# Patient Record
Sex: Female | Born: 1945 | Race: White | Hispanic: No | Smoking: Never smoker
Health system: Southern US, Community
[De-identification: ages and names within clinical notes are randomized; demographics above are authoritative.]

## PROBLEM LIST (undated history)

## (undated) DIAGNOSIS — K624 Stenosis of anus and rectum: Secondary | ICD-10-CM

## (undated) DIAGNOSIS — K219 Gastro-esophageal reflux disease without esophagitis: Secondary | ICD-10-CM

## (undated) DIAGNOSIS — G20 Parkinson's disease: Secondary | ICD-10-CM

## (undated) DIAGNOSIS — F32A Depression, unspecified: Secondary | ICD-10-CM

## (undated) DIAGNOSIS — Z87442 Personal history of urinary calculi: Secondary | ICD-10-CM

## (undated) DIAGNOSIS — K760 Fatty (change of) liver, not elsewhere classified: Secondary | ICD-10-CM

## (undated) DIAGNOSIS — N201 Calculus of ureter: Secondary | ICD-10-CM

## (undated) DIAGNOSIS — F0280 Dementia in other diseases classified elsewhere without behavioral disturbance: Principal | ICD-10-CM

## (undated) DIAGNOSIS — F419 Anxiety disorder, unspecified: Secondary | ICD-10-CM

## (undated) DIAGNOSIS — G20A1 Parkinson's disease without dyskinesia, without mention of fluctuations: Secondary | ICD-10-CM

## (undated) DIAGNOSIS — K449 Diaphragmatic hernia without obstruction or gangrene: Secondary | ICD-10-CM

## (undated) DIAGNOSIS — E785 Hyperlipidemia, unspecified: Secondary | ICD-10-CM

## (undated) DIAGNOSIS — Z8719 Personal history of other diseases of the digestive system: Secondary | ICD-10-CM

## (undated) DIAGNOSIS — L8990 Pressure ulcer of unspecified site, unspecified stage: Secondary | ICD-10-CM

## (undated) DIAGNOSIS — F329 Major depressive disorder, single episode, unspecified: Secondary | ICD-10-CM

## (undated) DIAGNOSIS — Z973 Presence of spectacles and contact lenses: Secondary | ICD-10-CM

## (undated) HISTORY — DX: Dementia in other diseases classified elsewhere without behavioral disturbance: F02.80

## (undated) HISTORY — DX: Parkinson's disease: G20

## (undated) HISTORY — DX: Hyperlipidemia, unspecified: E78.5

## (undated) HISTORY — DX: Major depressive disorder, single episode, unspecified: F32.9

## (undated) HISTORY — DX: Gastro-esophageal reflux disease without esophagitis: K21.9

## (undated) HISTORY — PX: TONSILLECTOMY: SUR1361

## (undated) HISTORY — DX: Depression, unspecified: F32.A

## (undated) HISTORY — DX: Diaphragmatic hernia without obstruction or gangrene: K44.9

## (undated) HISTORY — DX: Stenosis of anus and rectum: K62.4

## (undated) HISTORY — DX: Fatty (change of) liver, not elsewhere classified: K76.0

## (undated) HISTORY — DX: Anxiety disorder, unspecified: F41.9

---

## 1980-10-29 HISTORY — PX: CHOLECYSTECTOMY OPEN: SUR202

## 1982-10-29 HISTORY — PX: TOTAL ABDOMINAL HYSTERECTOMY W/ BILATERAL SALPINGOOPHORECTOMY: SHX83

## 1991-11-12 ENCOUNTER — Encounter: Payer: Self-pay | Admitting: Physician Assistant

## 1992-08-11 ENCOUNTER — Encounter: Payer: Self-pay | Admitting: Physician Assistant

## 1992-08-29 ENCOUNTER — Encounter: Payer: Self-pay | Admitting: Physician Assistant

## 1992-09-01 ENCOUNTER — Encounter: Payer: Self-pay | Admitting: Physician Assistant

## 1992-09-06 ENCOUNTER — Encounter: Payer: Self-pay | Admitting: Physician Assistant

## 1992-10-29 HISTORY — PX: ANAL FISSURECTOMY: SUR608

## 1993-05-12 ENCOUNTER — Encounter: Payer: Self-pay | Admitting: Physician Assistant

## 1993-11-27 ENCOUNTER — Encounter: Payer: Self-pay | Admitting: Physician Assistant

## 1994-11-03 ENCOUNTER — Encounter: Payer: Self-pay | Admitting: Physician Assistant

## 1995-06-05 ENCOUNTER — Encounter: Payer: Self-pay | Admitting: Physician Assistant

## 1995-07-15 ENCOUNTER — Encounter: Payer: Self-pay | Admitting: Physician Assistant

## 1996-08-26 ENCOUNTER — Encounter: Payer: Self-pay | Admitting: Physician Assistant

## 1996-10-30 ENCOUNTER — Encounter: Payer: Self-pay | Admitting: Physician Assistant

## 1996-11-03 ENCOUNTER — Encounter: Payer: Self-pay | Admitting: Physician Assistant

## 1996-11-06 ENCOUNTER — Encounter: Payer: Self-pay | Admitting: Physician Assistant

## 1996-11-13 ENCOUNTER — Encounter: Payer: Self-pay | Admitting: Physician Assistant

## 1996-11-17 ENCOUNTER — Encounter: Payer: Self-pay | Admitting: Physician Assistant

## 1997-02-17 ENCOUNTER — Encounter: Payer: Self-pay | Admitting: Physician Assistant

## 1997-03-23 ENCOUNTER — Encounter: Payer: Self-pay | Admitting: Physician Assistant

## 1997-06-30 ENCOUNTER — Encounter: Payer: Self-pay | Admitting: Physician Assistant

## 1997-11-15 ENCOUNTER — Encounter: Payer: Self-pay | Admitting: Physician Assistant

## 1998-08-05 ENCOUNTER — Encounter: Payer: Self-pay | Admitting: Physician Assistant

## 1998-08-26 ENCOUNTER — Encounter: Payer: Self-pay | Admitting: Physician Assistant

## 1999-04-07 ENCOUNTER — Emergency Department (HOSPITAL_COMMUNITY): Admission: EM | Admit: 1999-04-07 | Discharge: 1999-04-07 | Payer: Self-pay | Admitting: Emergency Medicine

## 1999-09-08 ENCOUNTER — Encounter: Payer: Self-pay | Admitting: Physician Assistant

## 2001-06-06 ENCOUNTER — Encounter: Payer: Self-pay | Admitting: Physician Assistant

## 2004-10-16 ENCOUNTER — Encounter: Payer: Self-pay | Admitting: Physician Assistant

## 2004-12-28 ENCOUNTER — Encounter: Payer: Self-pay | Admitting: Physician Assistant

## 2004-12-28 ENCOUNTER — Ambulatory Visit (HOSPITAL_COMMUNITY): Admission: RE | Admit: 2004-12-28 | Discharge: 2004-12-28 | Payer: Self-pay | Admitting: Gastroenterology

## 2010-08-01 ENCOUNTER — Ambulatory Visit: Payer: Self-pay | Admitting: Gastroenterology

## 2010-08-01 DIAGNOSIS — F411 Generalized anxiety disorder: Secondary | ICD-10-CM | POA: Insufficient documentation

## 2010-08-01 DIAGNOSIS — K3189 Other diseases of stomach and duodenum: Secondary | ICD-10-CM

## 2010-08-01 DIAGNOSIS — R1013 Epigastric pain: Secondary | ICD-10-CM

## 2010-08-01 DIAGNOSIS — K589 Irritable bowel syndrome without diarrhea: Secondary | ICD-10-CM

## 2010-08-01 DIAGNOSIS — K219 Gastro-esophageal reflux disease without esophagitis: Secondary | ICD-10-CM

## 2010-11-30 NOTE — Letter (Signed)
Summary: [facility]  [facility]   Imported By: Sherian Rein 08/07/2010 13:24:24  _____________________________________________________________________  External Attachment:    Type:   Image     Comment:   External Document

## 2010-11-30 NOTE — Letter (Signed)
Summary: [facility]  [facility]   Imported By: Sherian Rein 08/07/2010 13:44:59  _____________________________________________________________________  External Attachment:    Type:   Image     Comment:   External Document

## 2010-11-30 NOTE — Letter (Signed)
Summary: [facility]  [facility]   Imported By: Sherian Rein 08/07/2010 13:36:40  _____________________________________________________________________  External Attachment:    Type:   Image     Comment:   External Document

## 2010-11-30 NOTE — Letter (Signed)
Summary: [facility]  [facility]   Imported By: Sherian Rein 08/07/2010 13:32:00  _____________________________________________________________________  External Attachment:    Type:   Image     Comment:   External Document

## 2010-11-30 NOTE — Letter (Signed)
Summary: [facility]  [facility]   Imported By: Sherian Rein 08/07/2010 13:43:44  _____________________________________________________________________  External Attachment:    Type:   Image     Comment:   External Document

## 2010-11-30 NOTE — Letter (Signed)
Summary: [facility]  [facility]   Imported By: Sherian Rein 08/07/2010 14:02:00  _____________________________________________________________________  External Attachment:    Type:   Image     Comment:   External Document

## 2010-11-30 NOTE — Letter (Signed)
Summary: Med List 11-17-96 to 10-16-04/[facility]  Med List 11-17-96 to 10-16-04/[facility]   Imported By: Sherian Rein 08/07/2010 13:48:08  _____________________________________________________________________  External Attachment:    Type:   Image     Comment:   External Document

## 2010-11-30 NOTE — Letter (Signed)
Summary: Treatment Sheet 06-20-00 to 06-06-01/[facility]  Treatment Sheet 06-20-00 to 06-06-01/[facility]   Imported By: Sherian Rein 08/07/2010 13:28:56  _____________________________________________________________________  External Attachment:    Type:   Image     Comment:   External Document

## 2010-11-30 NOTE — Letter (Signed)
Summary: [facility]   [facility]   Imported By: Sherian Rein 08/07/2010 13:33:22  _____________________________________________________________________  External Attachment:    Type:   Image     Comment:   External Document

## 2010-11-30 NOTE — Letter (Signed)
Summary: [facility]   Treatment Sheet/[facility]   Imported By: Sherian Rein 08/07/2010 13:30:24  _____________________________________________________________________  External Attachment:    Type:   Image     Comment:   External Document

## 2010-11-30 NOTE — Letter (Signed)
Summary: [facility]  [facility]   Imported By: Sherian Rein 08/07/2010 13:42:46  _____________________________________________________________________  External Attachment:    Type:   Image     Comment:   External Document

## 2010-11-30 NOTE — Letter (Signed)
Summary: [facility]  [facility]   Imported By: Sherian Rein 08/07/2010 14:04:54  _____________________________________________________________________  External Attachment:    Type:   Image     Comment:   External Document

## 2010-11-30 NOTE — Letter (Signed)
Summary: Pt's Hx/[facility]  Pt's Hx/[facility]   Imported By: Sherian Rein 08/07/2010 13:46:23  _____________________________________________________________________  External Attachment:    Type:   Image     Comment:   External Document

## 2010-11-30 NOTE — Procedures (Signed)
Summary: EGD (pg 1)/[place]  EGD (pg 1)/[place]   Imported By: Sherian Rein 08/07/2010 14:00:11  _____________________________________________________________________  External Attachment:    Type:   Image     Comment:   External Document

## 2010-11-30 NOTE — Assessment & Plan Note (Signed)
Summary: UNABLE TO EAT/STOMACH FULL OF BUTTERFLIES//SP   History of Present Illness Visit Type: Initial Visit Primary GI MD: Rob Bunting MD Primary Kristia Jupiter: Dr [facility] Chief Complaint: Patie -nt c/o several months "nervous stomach" and "butterflies in stomach." There has been no associated pain. There has been some belching which is worse after eating and when lying down. History of Present Illness:   65 YO FEMALE  NEW TO GI TODAY. SHE IS S/P CHOLECYSTECTOMY, AND HAS HX OF CHRONIC GERD. SHE WAS PREVIOUSLY EVALUATED BY DR. Randa Evens BUT DOES NOT WOSH TO CONTINUE GI CARE WITH HIM. SHE REPORTS A BAD EXPERIENCE WITH PREVIOUS COLONOSCOPY IN 3/06. SHE WAS NOT ADEQUATELY SEDATED,SAYS SHE ASKED REPEATEDLY FOR THE PROCEDURE TO BE STOPPED AND IT WAS NOT.COLON 3/06 NEGATIVE EXCEPT ANAL STENOSIS.  SHE HAS BEEN ON PREVACID LONG TERM,DENIES ANY HEARTBURN OR DYSPHAGIA. SHE C/O PRIMARILY A "NERVOUS STOMACH" ,OR SENSATION OF QUIVERING OR "BUTTERFLIES".SHE FEELS WORSE WITH SPICY FOODS. ALSO SEEMS TO BE WORSE IF SHE IS ANXIOUS. HER BM'S ARE NORMAL, NO HEME.   GI Review of Systems    Reports acid reflux, belching, and  loss of appetite.      Denies abdominal pain, bloating, chest pain, dysphagia with liquids, dysphagia with solids, heartburn, nausea, vomiting, vomiting blood, weight loss, and  weight gain.      Reports constipation, hemorrhoids, and  irritable bowel syndrome.     Denies anal fissure, black tarry stools, change in bowel habit, diarrhea, diverticulosis, fecal incontinence, heme positive stool, jaundice, light color stool, liver problems, rectal bleeding, and  rectal pain. Preventive Screening-Counseling & Management  Alcohol-Tobacco     Smoking Status: never      Drug Use:  no.      Current Medications (verified): 1)  Alprazolam 0.5 Mg Tabs (Alprazolam) .... Take 1/2 Tablet By Mouth Before Meals 2)  Multivitamins  Tabs (Multiple Vitamin) .... Take 1 Tablet By  Mouth Once A Day 3)  Prevacid 30 Mg Cpdr (Lansoprazole) .... Take 1 Tablet By Mouth Once A Day  Allergies (verified): 1)  ! 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  Alcohol Use - no Illicit Drug Use - no Smoking Status:  never Drug Use:  no  Review of Systems       The patient complains of allergy/sinus, anxiety-new, and back pain.         SEE HPI  Vital Signs:  Patient profile:   65 year old female Height:      64.5 inches Weight:      185 pounds BMI:     31.38 BSA:     1.90 Pulse rate:   80 / minute Pulse rhythm:   regular BP sitting:   126 / 74  (right arm) Cuff size:   regular  Vitals Entered By: Lamona Curl CMA Duncan Dull) (August 01, 2010 9:46 AM)  Physical Exam  General:  Well developed, well nourished, no acute distress. Head:  Normocephalic and atraumatic. Eyes:  PERRLA, no icterus. Neck:  Supple; no masses or thyromegaly. Lungs:  Clear throughout to auscultation. Heart:  Regular rate and rhythm; no murmurs, rubs,  or bruits. Abdomen:  SOFT, NONTENDER, NO MASS OR HSM,BS+ Rectal:  NOT  DONE Extremities:  No clubbing, cyanosis, edema or deformities noted. Neurologic:  Alert and  oriented x4;  grossly normal neurologically. Psych:  Alert and cooperative. Normal mood and affect.   Impression & Recommendations:  Problem # 1:  DYSPEPSIA (ICD-536.8) Assessment New 64 YO FEMALE WITH CHRONIC GERD, WITH C/O NERVOUS STOMACH /BUTTERFLY SENSATION IN STOMACH PERSISTENT. SUSPECT IBS/DYSPEPSIA  R/O MED SIDE EFFECT WITH PREVACID.   TRIAL OF PROTONIX 40 MG DAILY IN AM TRIAL OF LEVSIN SL AS NEEDED, FOR  STOMACH SPASMS. FOLLOW UP WITH DR. Christella Hartigan.  Problem # 2:  ANXIETY (ICD-300.00) Assessment: Comment Only  Problem # 3:  GERD (ICD-530.81) Assessment: Unchanged  Problem # 4:  COLON NEOPLASIA SURVEILLANCE Assessment: Unchanged LAST COLON 3/06-DR. EDWARDS-NEGATIVE EXCEPT ANAL STENOSIS. NO FAMILY HX-DUE FOR FOLLOW UP 2016- WILL NEED PROPOFOL  FOR SEDATION  Patient Instructions: 1)  We sent a prescription for Protonix Generic to [facility] [address] 2)  We also sent one for Levsin Sublingual for pain and spasms. 3)  Copy sent to : Dr Salley Slaughter, [facility]. 4)  The medication list was reviewed and reconciled.  All changed / newly prescribed medications were explained.  A complete medication list was provided to the patient / caregiver. Prescriptions: LEVSIN/SL 0.125 MG SUBL (HYOSCYAMINE SULFATE) Take 1 tab on tongue to dissolve as needed stomach spasms 1-2 times daily  #60 x 1   Entered by:   Lowry Ram NCMA   Authorized by:   Sammuel Cooper PA-c   Signed by:   Lowry Ram NCMA on 08/01/2010   Method used:   Electronically to        [facility] [id number] (retail)       [address]       [place], [place]  [address]       Ph: [phone] or [phone]       Fax: [phone]   RxID:   [id number] PANTOPRAZOLE SODIUM 40 MG TBEC (PANTOPRAZOLE SODIUM) Take 1 tab 30 min before breakfast  #30 x 3   Entered by:   Lowry Ram NCMA   Authorized by:   Sammuel Cooper PA-c   Signed by:   Lowry Ram NCMA on 08/01/2010   Method used:   Electronically to        [facility] [id number] (retail)       [address]       [place], [place]  [address]       Ph: [phone] or [phone]       Fax: [phone]   RxID:   [id number]

## 2010-11-30 NOTE — Letter (Signed)
Summary: [facility]  [facility]   Imported By: Sherian Rein 08/07/2010 13:34:21  _____________________________________________________________________  External Attachment:    Type:   Image     Comment:   External Document

## 2010-11-30 NOTE — Letter (Signed)
Summary: Initial GYN Profile  Initial GYN Profile   Imported By: Sherian Rein 08/07/2010 13:52:24  _____________________________________________________________________  External Attachment:    Type:   Image     Comment:   External Document

## 2010-11-30 NOTE — Procedures (Signed)
Summary: Colonoscopy/[place]  Colonoscopy/[place]   Imported By: Sherian Rein 08/07/2010 14:06:28  _____________________________________________________________________  External Attachment:    Type:   Image     Comment:   External Document

## 2010-11-30 NOTE — Letter (Signed)
Summary: [facility]  [facility]   Imported By: Sherian Rein 08/07/2010 14:02:51  _____________________________________________________________________  External Attachment:    Type:   Image     Comment:   External Document

## 2010-11-30 NOTE — Letter (Signed)
Summary: [facility]  [facility]   Imported By: Sherian Rein 08/07/2010 14:03:40  _____________________________________________________________________  External Attachment:    Type:   Image     Comment:   External Document

## 2010-11-30 NOTE — Procedures (Signed)
Summary: EGD/[facility]  EGD/[place]   Imported By: Sherian Rein 08/07/2010 13:53:11  _____________________________________________________________________  External Attachment:    Type:   Image     Comment:   External Document

## 2010-11-30 NOTE — Letter (Signed)
Summary: [facility]enter[facility]  [facility]   Imported By: Sherian Rein 08/07/2010 13:58:11  _____________________________________________________________________  External Attachment:    Type:   Image     Comment:   External Document

## 2010-11-30 NOTE — Letter (Signed)
Summary: [facility]enter[facility]   [facility]   Imported By: Sherian Rein 08/07/2010 13:23:20  _____________________________________________________________________  External Attachment:    Type:   Image     Comment:   External Document

## 2010-11-30 NOTE — Letter (Signed)
Summary: [facility]rone[facility]   Imported By: Sherian Rein 08/07/2010 13:49:48  _____________________________________________________________________  External Attachment:    Type:   Image     Comment:   External Document

## 2010-11-30 NOTE — Letter (Signed)
Summary: [facility]enter[facility]  [facility]   Imported By: Sherian Rein 08/07/2010 14:01:08  _____________________________________________________________________  External Attachment:    Type:   Image     Comment:   External Document

## 2010-11-30 NOTE — Letter (Signed)
Summary: [facility]enter[facility]  [facility]enter[facility]   Imported By: Sherian Rein 08/07/2010 13:35:28  _____________________________________________________________________  External Attachment:    Type:   Image     Comment:   External Document

## 2010-11-30 NOTE — Letter (Signed)
Summary: [facility]   [facility]   Imported By: Sherian Rein 08/07/2010 13:25:37  _____________________________________________________________________  External Attachment:    Type:   Image     Comment:   External Document

## 2010-12-01 NOTE — Letter (Signed)
Summary: [facility]  [facility]astroenter[facility]   Imported By: Sherian Rein 08/07/2010 13:40:44  _____________________________________________________________________  External Attachment:    Type:   Image     Comment:   External Document

## 2010-12-01 NOTE — Letter (Signed)
Summary: [facility]  [facility]   Imported By: Sherian Rein 08/07/2010 13:37:48  _____________________________________________________________________  External Attachment:    Type:   Image     Comment:   External Document

## 2011-03-16 NOTE — Op Note (Signed)
NAMEKOHANA, AMBLE           ACCOUNT NO.:  [id number]   MEDICAL RECORD NO.:  [id number]          PATIENT TYPE:  AMB   LOCATION:  ENDO                         FACILITY:  [facility]   PHYSICIAN:  James L. Malon Kindle., M.D.DATE OF BIRTH:  11-Dec-1945   DATE OF PROCEDURE:  12/28/2004  DATE OF DISCHARGE:                                 OPERATIVE REPORT   PROCEDURE:  Colonoscopy.   MEDICATIONS:  Fentanyl 100 mcg, Versed 2 mg IV.   SCOPE:  Olympus pediatric adjustable colonoscope.   INDICATION:  A strong family history of colon cancer, history of anal  stenosis.   DESCRIPTION OF PROCEDURE:  The procedure had been explained to the patient  and consent obtained.  With the patient was in the left lateral decubitus  position, the Olympus scope was inserted and advanced under direct  visualization.  The prep was excellent and able to reach the cecum.  The  patient did have anal stenosis and took some sedation to be able to  introduce the scope.  The ileocecal valve and appendiceal orifice were seen.  The scope was withdrawn.  The cecum, ascending colon, transverse colon,  splenic flexure, descending, and sigmoid colon seemed well upon removal.  No  polyps seen.  The rectum and anal canal were normal.  The scope was  withdrawn.  The patient tolerated the procedure well.   ASSESSMENT:  1.  Family history of colon cancer with negative colonoscopy, V16.0.  2.  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   PLAN:  1.  Yearly Hemoccults.  2.  Will recommend repeating procedure in 5 years.      JLE/MEDQ  D:  12/28/2004  T:  12/28/2004  Job:  [id number]   cc:   Oley Balm. Georgina Pillion, M.D.  [address]  [place]  [place] [address]  Fax: [phone]

## 2011-04-23 ENCOUNTER — Emergency Department (HOSPITAL_COMMUNITY): Payer: Medicare Other

## 2011-04-23 ENCOUNTER — Emergency Department (HOSPITAL_COMMUNITY)
Admission: EM | Admit: 2011-04-23 | Discharge: 2011-04-23 | Disposition: A | Discharge status: Home / Self Care | Payer: Medicare Other | Attending: Emergency Medicine | Admitting: Emergency Medicine

## 2011-04-23 DIAGNOSIS — E785 Hyperlipidemia, unspecified: Secondary | ICD-10-CM | POA: Insufficient documentation

## 2011-04-23 DIAGNOSIS — M545 Low back pain, unspecified: Secondary | ICD-10-CM | POA: Insufficient documentation

## 2011-04-23 LAB — URINE MICROSCOPIC-ADD ON

## 2011-04-23 LAB — URINALYSIS, ROUTINE W REFLEX MICROSCOPIC
Glucose, UA: NEGATIVE mg/dL
Hgb urine dipstick: NEGATIVE
Ketones, ur: NEGATIVE mg/dL
Protein, ur: NEGATIVE mg/dL

## 2011-12-26 ENCOUNTER — Telehealth: Payer: Self-pay | Admitting: Internal Medicine

## 2011-12-26 NOTE — Telephone Encounter (Signed)
Pt has been scheduled for 01/04/12 145 pm pt is aware she has history of constipation and anal fissure.

## 2012-01-04 ENCOUNTER — Ambulatory Visit: Payer: Medicare Other | Admitting: Gastroenterology

## 2012-02-08 ENCOUNTER — Telehealth: Payer: Self-pay | Admitting: Gastroenterology

## 2012-02-08 NOTE — Telephone Encounter (Signed)
That is ok with me if it is OK with Dr. Brodie. 

## 2012-02-11 ENCOUNTER — Encounter: Payer: Self-pay | Admitting: *Deleted

## 2012-02-11 NOTE — Telephone Encounter (Signed)
OK  DB

## 2012-02-19 ENCOUNTER — Ambulatory Visit (INDEPENDENT_AMBULATORY_CARE_PROVIDER_SITE_OTHER): Payer: Medicare Other | Admitting: Internal Medicine

## 2012-02-19 ENCOUNTER — Encounter: Payer: Self-pay | Admitting: Internal Medicine

## 2012-02-19 DIAGNOSIS — K5900 Constipation, unspecified: Secondary | ICD-10-CM

## 2012-02-19 DIAGNOSIS — K624 Stenosis of anus and rectum: Secondary | ICD-10-CM

## 2012-02-19 MED ORDER — LUBIPROSTONE 24 MCG PO CAPS: 24.0000 ug | ORAL_CAPSULE | Freq: Every day | ORAL | Status: AC

## 2012-02-19 MED ORDER — HYDROCORTISONE ACE-PRAMOXINE 2.5-1 % RE CREA: TOPICAL_CREAM | Freq: Three times a day (TID) | RECTAL | Status: AC | PRN

## 2012-02-19 NOTE — Patient Instructions (Addendum)
We have sent the following medications to your pharmacy for you to pick up at your convenience: Amitiza- once daily Analpram Cc: Dr Carilyn Goodpasture Docia Chuck

## 2012-02-19 NOTE — Progress Notes (Signed)
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 She has a history of a hemorrhoidectomy and anal fissurectomy by Dr. Earlene Plater in 1994. The surgery was successful. She now has difficulty evacuating stool and has to strain in spite of taking Colace 100 mg 2 tablets a day. 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 Her level of activity has not changed. She has been taking calcium supplements which are contributing to her constipation. A colonoscopy in March 2006 by Dr. Randa Evens was normal. She was having a lot of pain during the colonoscopy. A recall colonoscopy is due 10 years from her last colonoscopy which would be March 2016. She has a family history of colon cancer in a maternal uncle.  She also had an upper endoscopy in 1998 which showed a small hiatal hernia.   Past Medical History  Diagnosis Date  . Anal stenosis   . Anxiety   . Hyperlipidemia   . IBS (irritable bowel syndrome)   . GERD (gastroesophageal reflux disease)   . Hiatal hernia    Past Surgical History  Procedure Date  . Cholecystectomy   . Hemorrhoid surgery   . Abdominal hysterectomy   . Appendectomy     reports that she has never smoked. She has never used smokeless tobacco. She reports that she does not drink alcohol or use illicit drugs. family history includes Breast cancer in her maternal aunt; Diabetes in her brother; and Pancreatic cancer (age of onset:56) in her maternal uncle.  There is no history of Colon cancer. Allergies  Allergen Reactions  . Sulfonamide Derivatives     REACTION: "flushed"        Review of Systems: Denies heartburn chest pain shortness of breath  The remainder of the 10 point ROS is negative except as outlined in H&P   Physical Exam: General appearance  Well developed, in no distress. Eyes- non icteric. HEENT nontraumatic, normocephalic. Mouth no lesions, tongue  papillated, no cheilosis. Neck supple without adenopathy, thyroid not enlarged, no carotid bruits, no JVD. Lungs Clear to auscultation bilaterally. Cor normal S1, normal S2, regular rhythm, no murmur,  quiet precordium. Abdomen: Soft nontender abdomen with normal active bowel sounds. No tenderness. Liver edge at costal margin. Post cholecystectomy scar in right upper quadrant. Rectal: Patient refused a rectal exam. Perianal area shows  small skin tags at the anal orifice. Tried to insert a finger into the rectum but encountered resistance/spasm and pain.. The patient started to have a lot of discomfort. A small amount of stool was Hemoccult negative. Extremities no pedal edema. Skin no lesions. Neurological alert and oriented x 3. Psychological normal mood and affect.  Assessment and Plan:  Functional constipation. Associated with rectal spasm versus anal stenosis. Unable to examine the patient. She is post prior hemorrhoidectomy which might have resulted in anal rectal scarring. She does not desire to have another colonoscopy until her due date 2016. We will start Analpram cream 2.5% when necessary rectal irritation. We are  going to add Amitiza 24 mcg daily . She will continue Colace 200 mg daily. I have also suggested using calcium supplements in combination with  magnesium to  reduce the constipating effect of the calcium. She will stay on high-fiber diet and use prune juice as needed.   02/19/2012 [name]

## 2012-06-18 ENCOUNTER — Other Ambulatory Visit: Payer: Self-pay | Admitting: Obstetrics & Gynecology

## 2012-06-18 DIAGNOSIS — N63 Unspecified lump in unspecified breast: Secondary | ICD-10-CM

## 2012-06-24 ENCOUNTER — Ambulatory Visit
Admission: RE | Admit: 2012-06-24 | Discharge: 2012-06-24 | Disposition: A | Discharge status: Home / Self Care | Payer: Medicare Other | Source: Ambulatory Visit | Attending: Obstetrics & Gynecology | Admitting: Obstetrics & Gynecology

## 2012-06-24 DIAGNOSIS — N63 Unspecified lump in unspecified breast: Secondary | ICD-10-CM

## 2012-08-19 ENCOUNTER — Other Ambulatory Visit: Payer: Self-pay | Admitting: Obstetrics & Gynecology

## 2012-08-19 DIAGNOSIS — Z1231 Encounter for screening mammogram for malignant neoplasm of breast: Secondary | ICD-10-CM

## 2012-09-10 ENCOUNTER — Ambulatory Visit
Admission: RE | Admit: 2012-09-10 | Discharge: 2012-09-10 | Disposition: A | Discharge status: Home / Self Care | Payer: Medicare Other | Source: Ambulatory Visit | Attending: Obstetrics & Gynecology | Admitting: Obstetrics & Gynecology

## 2012-09-10 DIAGNOSIS — Z1231 Encounter for screening mammogram for malignant neoplasm of breast: Secondary | ICD-10-CM

## 2012-09-19 ENCOUNTER — Emergency Department (HOSPITAL_BASED_OUTPATIENT_CLINIC_OR_DEPARTMENT_OTHER)
Admission: EM | Admit: 2012-09-19 | Discharge: 2012-09-20 | Disposition: A | Discharge status: Home / Self Care | Payer: Medicare Other | Attending: Emergency Medicine | Admitting: Emergency Medicine

## 2012-09-19 DIAGNOSIS — F411 Generalized anxiety disorder: Secondary | ICD-10-CM | POA: Insufficient documentation

## 2012-09-19 DIAGNOSIS — Z79899 Other long term (current) drug therapy: Secondary | ICD-10-CM | POA: Insufficient documentation

## 2012-09-19 DIAGNOSIS — K624 Stenosis of anus and rectum: Secondary | ICD-10-CM | POA: Insufficient documentation

## 2012-09-19 DIAGNOSIS — Z9089 Acquired absence of other organs: Secondary | ICD-10-CM | POA: Insufficient documentation

## 2012-09-19 DIAGNOSIS — F419 Anxiety disorder, unspecified: Secondary | ICD-10-CM

## 2012-09-19 DIAGNOSIS — Z8719 Personal history of other diseases of the digestive system: Secondary | ICD-10-CM | POA: Insufficient documentation

## 2012-09-19 DIAGNOSIS — E785 Hyperlipidemia, unspecified: Secondary | ICD-10-CM | POA: Insufficient documentation

## 2012-09-19 DIAGNOSIS — K589 Irritable bowel syndrome without diarrhea: Secondary | ICD-10-CM | POA: Insufficient documentation

## 2012-09-19 DIAGNOSIS — K449 Diaphragmatic hernia without obstruction or gangrene: Secondary | ICD-10-CM | POA: Insufficient documentation

## 2012-09-19 DIAGNOSIS — Z90711 Acquired absence of uterus with remaining cervical stump: Secondary | ICD-10-CM | POA: Insufficient documentation

## 2012-09-19 LAB — CBC WITH DIFFERENTIAL/PLATELET
Basophils Absolute: 0 10*3/uL (ref 0.0–0.1)
Basophils Relative: 0 % (ref 0–1)
Eosinophils Absolute: 0.1 10*3/uL (ref 0.0–0.7)
HCT: 37.6 % (ref 36.0–46.0)
Hemoglobin: 13.3 g/dL (ref 12.0–15.0)
Lymphocytes Relative: 37 % (ref 12–46)
MCH: 32 pg (ref 26.0–34.0)
MCHC: 35.4 g/dL (ref 30.0–36.0)
Monocytes Absolute: 0.3 10*3/uL (ref 0.1–1.0)
Neutro Abs: 2.4 10*3/uL (ref 1.7–7.7)
RDW: 13.1 % (ref 11.5–15.5)

## 2012-09-19 LAB — COMPREHENSIVE METABOLIC PANEL
BUN: 9 mg/dL (ref 6–23)
CO2: 27 mEq/L (ref 19–32)
Calcium: 9.2 mg/dL (ref 8.4–10.5)
Creatinine, Ser: 0.7 mg/dL (ref 0.50–1.10)
GFR calc Af Amer: >90 mL/min (ref >90)
GFR calc non Af Amer: 88 mL/min — ABNORMAL LOW (ref >90)
Glucose, Bld: 130 mg/dL — ABNORMAL HIGH (ref 70–99)
Total Protein: 7.6 g/dL (ref 6.0–8.3)

## 2012-09-19 LAB — RAPID URINE DRUG SCREEN, HOSP PERFORMED
Amphetamines: NOT DETECTED
Benzodiazepines: POSITIVE — AB
Cocaine: NOT DETECTED
Opiates: NOT DETECTED

## 2012-09-19 LAB — ETHANOL: Alcohol, Ethyl (B): <11 mg/dL (ref 0–11)
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
## 2012-09-19 NOTE — ED Notes (Signed)
Therapeutic alternatives counselor called to report that she is on the way here to speak with patient. RN notified.

## 2012-09-19 NOTE — ED Notes (Signed)
PA at bedside now  
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
## 2012-09-19 NOTE — ED Notes (Signed)
[facility] called. 

## 2012-09-20 NOTE — ED Provider Notes (Signed)
Pt has been seen and assessed by mobile crisis.  She continues to have no SI/HI.  Mobile crisis has given her information about outpatient followup.  She states she is safe to leave with daughter.  Discharged with strict return precautions.  Pt agreeable with plan.  Ethelda Chick, MD 09/20/12 [phone]

## 2012-09-20 NOTE — ED Notes (Signed)
Mobile Crisis counselor with pt now

## 2012-09-23 ENCOUNTER — Encounter: Payer: Self-pay | Admitting: *Deleted

## 2012-09-23 NOTE — ED Provider Notes (Signed)
Medical screening examination/treatment/procedure(s) were performed by non-physician practitioner and as supervising physician I was immediately available for consultation/collaboration.  Raeford Razor, MD 09/23/12 1452

## 2012-10-01 ENCOUNTER — Ambulatory Visit (INDEPENDENT_AMBULATORY_CARE_PROVIDER_SITE_OTHER): Payer: Medicare Other | Admitting: Internal Medicine

## 2012-10-01 ENCOUNTER — Encounter: Payer: Self-pay | Admitting: Internal Medicine

## 2012-10-01 VITALS — BP 130/82 | HR 88 | Ht 64.0 in | Wt 188.8 lb

## 2012-10-01 DIAGNOSIS — R1013 Epigastric pain: Secondary | ICD-10-CM

## 2012-10-01 NOTE — Patient Instructions (Addendum)
You have been scheduled for an endoscopy with propofol. Please follow written instructions given to you at your visit today. If you use inhalers (even only as needed) or a CPAP machine, please bring them with you on the day of your procedure.  CC: Dr Carilyn Goodpasture Docia Chuck
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
## 2012-10-02 ENCOUNTER — Ambulatory Visit (AMBULATORY_SURGERY_CENTER): Payer: Medicare Other | Admitting: Internal Medicine

## 2012-10-02 ENCOUNTER — Encounter: Payer: Self-pay | Admitting: Internal Medicine

## 2012-10-02 ENCOUNTER — Other Ambulatory Visit: Payer: Self-pay | Admitting: Family Medicine

## 2012-10-02 VITALS — BP 149/82 | HR 77 | Temp 98.8°F | Resp 18 | Ht 64.0 in | Wt 188.0 lb

## 2012-10-02 DIAGNOSIS — K219 Gastro-esophageal reflux disease without esophagitis: Secondary | ICD-10-CM

## 2012-10-02 DIAGNOSIS — R1013 Epigastric pain: Secondary | ICD-10-CM

## 2012-10-02 MED ORDER — SODIUM CHLORIDE 0.9 % IV SOLN: 500.0000 mL | INTRAVENOUS | Status: DC

## 2012-10-02 MED ORDER — LEVSIN/SL 0.125 MG SL SUBL: 0.1250 mg | SUBLINGUAL_TABLET | SUBLINGUAL | Status: DC | PRN

## 2012-10-02 NOTE — Op Note (Signed)
[facility] [address] [place], [address]   ENDOSCOPY PROCEDURE REPORT  PATIENT: Becky Figueroa, Becky Figueroa  MR#: [id number] BIRTHDATE: September 06, 1946 , 66  yrs. old GENDER: Female ENDOSCOPIST: Hart Carwin, MD REFERRED BY:  Darrow Bussing, M.D. PROCEDURE DATE:  10/02/2012 PROCEDURE:  EGD, diagnostic ASA CLASS:     Class II INDICATIONS:  Epigastric pain.,EGD 1998- hiatal hernia MEDICATIONS: MAC sedation, administered by CRNA and Propofol (Diprivan) 80 mg IV TOPICAL ANESTHETIC: none  DESCRIPTION OF PROCEDURE: After the risks benefits and alternatives of the procedure were thoroughly explained, informed consent was obtained.  The LB GIF-H180 [id number] endoscope was introduced through the mouth and advanced to the second portion of the duodenum. Without limitations.  The instrument was slowly withdrawn as the mucosa was fully examined.      The upper, middle and distal third of the esophagus were carefully inspected and no abnormalities were noted.  The z-line was well seen at the GEJ.  The endoscope was pushed into the fundus which was normal including a retroflexed view.  The antrum, gastric body, first and second part of the duodenum were unremarkable. Retroflexed views revealed no abnormalities.     The scope was then withdrawn from the patient and the procedure completed.  COMPLICATIONS: There were no complications. ENDOSCOPIC IMPRESSION:  Normal EGD  RECOMMENDATIONS: nothing to account for the abdominal pain, which may be functional ( stress related), use Levsin SL .125 mg prn REPEAT EXAM: no recall  eSigned:  Hart Carwin, MD 10/02/2012 4:12 PM   CC:

## 2012-10-02 NOTE — Progress Notes (Signed)
No complaints noted in the recovery room. Maw  Patient did not have preoperative order for IV antibiotic SSI prophylaxis. (G8918) Patient did not experience any of the following events: a burn prior to discharge; a fall within the facility; wrong site/side/patient/procedure/implant event; or a hospital transfer or hospital admission upon discharge from the facility. (G8907)  

## 2012-10-02 NOTE — Patient Instructions (Signed)
YOU HAD AN ENDOSCOPIC PROCEDURE TODAY AT THE [facility]: Refer to the procedure report that was given to you for any specific questions about what was found during the examination.  If the procedure report does not answer your questions, please call your gastroenterologist to clarify.  If you requested that your care partner not be given the details of your procedure findings, then the procedure report has been included in a sealed envelope for you to review at your convenience later.  YOU SHOULD EXPECT: Some feelings of bloating in the abdomen. Passage of more gas than usual.  Walking can help get rid of the air that was put into your GI tract during the procedure and reduce the bloating. If you had a lower endoscopy (such as a colonoscopy or flexible sigmoidoscopy) you may notice spotting of blood in your stool or on the toilet paper. If you underwent a bowel prep for your procedure, then you may not have a normal bowel movement for a few days.  DIET: Your first meal following the procedure should be a light meal and then it is ok to progress to your normal diet.  A half-sandwich or bowl of soup is an example of a good first meal.  Heavy or fried foods are harder to digest and may make you feel nauseous or bloated.  Likewise meals heavy in dairy and vegetables can cause extra gas to form and this can also increase the bloating.  Drink plenty of fluids but you should avoid alcoholic beverages for 24 hours.  ACTIVITY: Your care partner should take you home directly after the procedure.  You should plan to take it easy, moving slowly for the rest of the day.  You can resume normal activity the day after the procedure however you should NOT DRIVE or use heavy machinery for 24 hours (because of the sedation medicines used during the test).    SYMPTOMS TO REPORT IMMEDIATELY: A gastroenterologist can be reached at any hour.  During normal business hours, 8:30 AM to 5:00 PM Monday through Friday,  call [phone].  After hours and on weekends, please call the GI answering service at [phone] who will take a message and have the physician on call contact you.  Following upper endoscopy (EGD)  Vomiting of blood or coffee ground material  New chest pain or pain under the shoulder blades  Painful or persistently difficult swallowing  New shortness of breath  Fever of 100F or higher  Black, tarry-looking stools  FOLLOW UP: If any biopsies were taken you will be contacted by phone or by letter within the next 1-3 weeks.  Call your gastroenterologist if you have not heard about the biopsies in 3 weeks.  Our staff will call the home number listed on your records the next business day following your procedure to check on you and address any questions or concerns that you may have at that time regarding the information given to you following your procedure. This is a courtesy call and so if there is no answer at the home number and we have not heard from you through the emergency physician on call, we will assume that you have returned to your regular daily activities without incident.  SIGNATURES/CONFIDENTIALITY: You and/or your care partner have signed paperwork which will be entered into your electronic medical record.  These signatures attest to the fact that that the information above on your After Visit Summary has been reviewed and is understood.  Full responsibility of   the confidentiality of this discharge information lies with you and/or your care-partner. 

## 2012-10-02 NOTE — Progress Notes (Signed)
1612 a/ox3 pleased report to [facility]

## 2012-10-03 ENCOUNTER — Telehealth: Payer: Self-pay | Admitting: *Deleted

## 2012-10-03 NOTE — Telephone Encounter (Signed)
  Follow up Call-  Call back number 10/02/2012  Post procedure Call Back phone  # [phone]  Permission to leave phone message Yes     Patient questions:  Do you have a fever, pain , or abdominal swelling? no Pain Score  0 *  Have you tolerated food without any problems? yes  Have you been able to return to your normal activities? yes  Do you have any questions about your discharge instructions: Diet   no Medications  no Follow up visit  no  Do you have questions or concerns about your Care? no  Actions: * If pain score is 4 or above: No action needed, pain <4.

## 2012-10-10 ENCOUNTER — Telehealth: Payer: Self-pay | Admitting: Internal Medicine

## 2012-10-10 NOTE — Telephone Encounter (Signed)
Donnatal 1 po qd prn abd pain/spasm,#30, 1 refill

## 2012-10-10 NOTE — Telephone Encounter (Signed)
Spoke with patient and she states the Levsin helps the cramping and nervous stomach but it makes her have nausea and she cannot eat. She also feels shaky when she takes it. She is asking if there is something else she can try. Please, advise.

## 2012-10-13 ENCOUNTER — Ambulatory Visit
Admission: RE | Admit: 2012-10-13 | Discharge: 2012-10-13 | Disposition: A | Discharge status: Home / Self Care | Payer: Medicare Other | Source: Ambulatory Visit | Attending: Family Medicine | Admitting: Family Medicine

## 2012-10-13 ENCOUNTER — Other Ambulatory Visit: Payer: Self-pay | Admitting: Internal Medicine

## 2012-10-13 MED ORDER — BELLADONNA ALK-PHENOBARBITAL 16.2 MG PO TABS: ORAL_TABLET | ORAL | Status: DC

## 2012-10-13 NOTE — Telephone Encounter (Signed)
Spoke with patient and gave her Dr. Brodie's recommendations. 

## 2012-10-13 NOTE — Telephone Encounter (Signed)
Rx sent to pharmacy. Left a message for patient to call me. 

## 2012-10-31 ENCOUNTER — Other Ambulatory Visit: Payer: Self-pay | Admitting: *Deleted

## 2012-10-31 MED ORDER — HYOSCYAMINE SULFATE 0.125 MG SL SUBL: 0.1250 mg | SUBLINGUAL_TABLET | SUBLINGUAL | Status: DC | PRN

## 2012-11-05 ENCOUNTER — Telehealth: Payer: Self-pay | Admitting: Internal Medicine

## 2012-11-05 NOTE — Telephone Encounter (Signed)
OK to take Donnatal bid. X 1 week, then reassess if it is helping.
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
## 2012-11-06 ENCOUNTER — Encounter: Payer: Self-pay | Admitting: *Deleted

## 2012-11-06 NOTE — Telephone Encounter (Signed)
Patient given Dr. Regino Schultze recommendation. She will take Donnatal BID x  Week and then call with update.

## 2012-11-10 ENCOUNTER — Telehealth: Payer: Self-pay | Admitting: Internal Medicine

## 2012-11-10 NOTE — Telephone Encounter (Signed)
Please take Donnatal tid. Also start Zoloft 50 mg po qhs, #30, 1 refill

## 2012-11-10 NOTE — Telephone Encounter (Signed)
Spoke with patient and she states she did good the first 3 days with Donnatal but woke up this AM with the same symptoms and has not felt good today. She is asking if there is anything else to try. Please, advise.

## 2012-11-11 MED ORDER — BELLADONNA ALK-PHENOBARBITAL 16.2 MG PO TABS: ORAL_TABLET | ORAL | Status: DC

## 2012-11-11 MED ORDER — SERTRALINE HCL 50 MG PO TABS: ORAL_TABLET | ORAL | Status: DC

## 2012-11-11 NOTE — Addendum Note (Signed)
Addended by: Richardson Chiquito on: 11/11/2012 11:45 AM   Modules accepted: Orders

## 2012-11-11 NOTE — Telephone Encounter (Signed)
Spoke with patient and gave her Dr. Brodie's recommendations. Rx sent to pharmacy. 

## 2012-12-09 ENCOUNTER — Ambulatory Visit (INDEPENDENT_AMBULATORY_CARE_PROVIDER_SITE_OTHER): Payer: Medicare Other | Admitting: Internal Medicine

## 2012-12-09 ENCOUNTER — Encounter: Payer: Self-pay | Admitting: Internal Medicine

## 2012-12-09 VITALS — BP 132/78 | HR 84 | Ht 63.5 in | Wt 187.1 lb

## 2012-12-09 DIAGNOSIS — R1013 Epigastric pain: Secondary | ICD-10-CM

## 2012-12-09 DIAGNOSIS — K3189 Other diseases of stomach and duodenum: Secondary | ICD-10-CM

## 2012-12-09 MED ORDER — PHENOBARBITAL 16.2 MG PO TABS: ORAL_TABLET | ORAL | Status: DC

## 2012-12-09 MED ORDER — DICYCLOMINE HCL 10 MG PO CAPS: ORAL_CAPSULE | ORAL | Status: DC

## 2012-12-09 NOTE — Progress Notes (Signed)
Becky Figueroa 05-21-1946 MRN [id number]   History of Present Illness:  This is a 67 year old white female with epigastric pain and an essentially normal upper endoscopy in December 2013. She has been on Prevacid 30 mg daily and donnatal as needed. She has noticed a marked improvement of the abdominal pain after she takes Donnatal. The prescription however has not been covered by her insurance and she paid over $200 for 30 day supply. She has a history of a laparoscopic cholecystectomy. Most of the abdominal discomfort occurs after supper. Her weight has been stable. She is up-to-date on her colonoscopy. Her next exam will be due in 2016.   Past Medical History  Diagnosis Date  . Anal stenosis   . Anxiety   . Hyperlipidemia   . IBS (irritable bowel syndrome)   . GERD (gastroesophageal reflux disease)   . Hiatal hernia   . Depression   . Fatty liver    Past Surgical History  Procedure Laterality Date  . Cholecystectomy    . Hemorrhoid surgery    . Abdominal hysterectomy    . Appendectomy    . Anal fissurectomy      reports that she has never smoked. She has never used smokeless tobacco. She reports that she does not drink alcohol or use illicit drugs. family history includes Breast cancer in her maternal aunt; Diabetes in her brother; and Pancreatic cancer (age of onset: 53) in her maternal uncle.  There is no history of Colon cancer. Allergies  Allergen Reactions  . Sulfonamide Derivatives     REACTION: "flushed"        Review of Systems: Denies dysphagia odynophagia or change in bowel habits  The remainder of the 10 point ROS is negative except as outlined in H&P   Physical Exam: General appearance  Well developed, in no distress.Marland Kitchen Psychological normal mood and affect.  Assessment and Plan:  Problem #1 Functional abdominal pain responding to antispasmodics. We will continue the Donnatal a generic form which will include phenobarbital 16.2 mg and dicyclomine 10 mg  ordered separately but she will take it at the same time.  Problem #2 Colorectal screening. Her last colonoscopy was in 2006. Her next colonoscopy will be due in 2016.  12/09/2012 Becky Figueroa

## 2012-12-09 NOTE — Patient Instructions (Addendum)
We have sent the following medications to your pharmacy for you to pick up at your convenience: Phenobarbital 16.2 mg-Take 1 tablet by mouth up to three times daily as needed (take with dicyclomine) Dicyclomine 10 mg -Take 1 tablet by mouth up to three times daily as needed (take with phenobarbital)  Please discontinue the donnatal as we are breaking down the medication for you (so it will be cheaper)  CC: Dr Carilyn Goodpasture Docia Chuck

## 2012-12-15 ENCOUNTER — Telehealth: Payer: Self-pay | Admitting: Internal Medicine

## 2012-12-15 NOTE — Telephone Encounter (Signed)
I have given her Phenobarb 16.2 mg abd Bentyl 10 mg in place of Donnatal which, she reported, was too expensive. She can take only the Bentyl ( Dicyclomine) as needed.

## 2012-12-15 NOTE — Telephone Encounter (Signed)
Patient given Dr. Brodie's recommendation. 

## 2012-12-15 NOTE — Telephone Encounter (Signed)
Spoke with patient and she states she took the Donnatal pill on Saturday night and had unbearable stomach pain. She states she did not feel like herself either. Reports she has not taken anymore of it. She is asking if there is something else to try. Please, advise.

## 2013-02-27 ENCOUNTER — Telehealth: Payer: Self-pay | Admitting: Internal Medicine

## 2013-03-02 ENCOUNTER — Telehealth: Payer: Self-pay | Admitting: Internal Medicine

## 2013-03-02 MED ORDER — HYDROCORTISONE ACETATE 25 MG RE SUPP: RECTAL | Status: DC

## 2013-03-02 NOTE — Telephone Encounter (Signed)
Spoke with patient and she states it is painful to insert a suppository. She is asking for a cream. Please, advise.

## 2013-03-02 NOTE — Telephone Encounter (Signed)
Spoke with patient and gave her Dr. Brodie's recommendation. Rx sent to pharmacy. 

## 2013-03-02 NOTE — Telephone Encounter (Signed)
Patient states she had pneumonia and had to take antibiotics. She had constipation and now her hemorrhoids are painful. She is taking Miralax daily and was instructed to increase to BID or TID as needed. She denies any rectal bleeding just has hemorrhoid pain. Would like something for this. Please, advise.

## 2013-03-02 NOTE — Telephone Encounter (Signed)
Analpram 2.5 %, 30 gm, apply tid and prn  With applicator, 1 refill

## 2013-03-02 NOTE — Addendum Note (Signed)
Addended by: Daphine Deutscher on: 03/02/2013 01:21 PM   Modules accepted: Orders

## 2013-03-02 NOTE — Telephone Encounter (Signed)
Anusol HC supp, 1 bid x 3 days then hs., disp #12, no refill ----- Message ----- From: Daphine Deutscher, RN Sent: 03/02/2013 10:53 AM To: Hart Carwin, MD

## 2013-03-03 MED ORDER — HYDROCORTISONE ACE-PRAMOXINE 2.5-1 % RE CREA: TOPICAL_CREAM | RECTAL | Status: DC

## 2013-03-03 NOTE — Telephone Encounter (Signed)
Rx sent to pharmacy. Patient notified. 

## 2013-03-09 ENCOUNTER — Ambulatory Visit (INDEPENDENT_AMBULATORY_CARE_PROVIDER_SITE_OTHER): Payer: Medicare Other | Admitting: Gastroenterology

## 2013-03-09 ENCOUNTER — Encounter: Payer: Self-pay | Admitting: Gastroenterology

## 2013-03-09 ENCOUNTER — Telehealth: Payer: Self-pay | Admitting: Internal Medicine

## 2013-03-09 VITALS — BP 130/70 | HR 80 | Ht 63.39 in | Wt 183.2 lb

## 2013-03-09 DIAGNOSIS — K602 Anal fissure, unspecified: Secondary | ICD-10-CM

## 2013-03-09 DIAGNOSIS — K5900 Constipation, unspecified: Secondary | ICD-10-CM | POA: Insufficient documentation

## 2013-03-09 MED ORDER — DILTIAZEM GEL 2 %: 1.0000 "application " | Freq: Two times a day (BID) | CUTANEOUS | Status: DC

## 2013-03-09 MED ORDER — LUBIPROSTONE 8 MCG PO CAPS: 8.0000 ug | ORAL_CAPSULE | Freq: Two times a day (BID) | ORAL | Status: DC

## 2013-03-09 NOTE — Progress Notes (Signed)
Reviewed and agree .She may need exam under anesthesia if I cannot examine her on next OV

## 2013-03-09 NOTE — Progress Notes (Signed)
03/09/2013 Becky Figueroa [id number] [birth year]   History of Present Illness:  Patient is a pleasant 67 year old female who is a patient of Dr. Regino Schultze.  She presents to our office today with complaints of severe rectal pain with BM's.  Says that she always suffers from constipation.  Recently constipation has been worse and Miralax BID is not helping.  She was straining a lot and now, for the past two weeks, has a lot of pain in her anus when moving her bowels.  Feels like a stinging/burning/cutting pain.  Has been using anusol cream, but does not seem to be helping.  Cannot use suppositories because it is too painful.  Says that she had a fissure many many years ago as well.  Colonoscopy 2006 showed anal stenosis (was performed by Dr. Randa Evens); according to Dr. Regino Schultze most recent note.  Current Medications, Allergies, Past Medical History, Past Surgical History, Family History and Social History were reviewed in [employer] record.   Physical Exam: BP 130/70  Pulse 80  Ht 5' 3.39" (1.61 m)  Wt 183 lb 4 oz (83.122 kg)  BMI 32.07 kg/m2 General: Well developed, white female in no acute distress Head: Normocephalic and atraumatic Eyes:  sclerae anicteric, conjunctiva pink  Ears: Normal auditory acuity Abdomen: Soft, non-tender and non-distended. No masses, no hepatomegaly. Normal bowel sounds. Rectal:  Small non-inflamed external hemorrhoids noted.  Very painful when attempted to performed DRE. Musculoskeletal: Symmetrical with no gross deformities  Extremities: No edema  Neurological: Alert oriented x 4, grossly nonfocal Psychological:  Alert and cooperative. Normal mood and affect  Assessment and Recommendations: -Rectal pain:  Likely has an anal fissure.  Had a lot of pain upon attempt of DRE.  Will treat with diltiazem cream (told her that she needs to try to get it inside the anus slightly, about an inch, as well). -Constipation:  No relief with BID  Miralax.  Will give amitiza 8 mcg BID with food.  *She will call back in several days with an update.

## 2013-03-09 NOTE — Patient Instructions (Addendum)
We have sent the following medications to your pharmacy for you to pick up at your convenience: Diltiazem gel to [facility] to compound.  We have given you samples of Amitiza 8 mcg to start one tablet by mouth twice daily with food. [facility] also [facility] after you finish with samples.   Anal Fissure, Adult An anal fissure is a small tear or crack in the skin around the anus. Bleeding from a fissure usually stops on its own within a few minutes. However, bleeding will often reoccur with each bowel movement until the crack heals.  CAUSES   Passing large, hard stools.  Frequent diarrheal stools.  Constipation.  Inflammatory bowel disease (Crohn's disease or ulcerative colitis).  Infections.  Anal sex. SYMPTOMS   Small amounts of blood seen on your stools, on toilet paper, or in the toilet after a bowel movement.  Rectal bleeding.  Painful bowel movements.  Itching or irritation around the anus. DIAGNOSIS Your caregiver will examine the anal area. An anal fissure can usually be seen with careful inspection. A rectal exam may be performed and a short tube (anoscope) may be used to examine the anal canal. TREATMENT   You may be instructed to take fiber supplements. These supplements can soften your stool to help make bowel movements easier.  Sitz baths may be recommended to help heal the tear. Do not use soap in the sitz baths.  A medicated cream or ointment may be prescribed to lessen discomfort. HOME CARE INSTRUCTIONS   Maintain a diet high in fruits, whole grains, and vegetables. Avoid constipating foods like bananas and dairy products.  Take sitz baths as directed by your caregiver.  Drink enough fluids to keep your urine clear or pale yellow.  Only take over-the-counter or prescription medicines for pain, discomfort, or fever as directed by your caregiver. Do not take aspirin as this may increase bleeding.  Do not use ointments containing  numbing medications (anesthetics) or hydrocortisone. They could slow healing. SEEK MEDICAL CARE IF:   Your fissure is not completely healed within 3 days.  You have further bleeding.  You have a fever.  You have diarrhea mixed with blood.  You have pain.  Your problem is getting worse rather than better. MAKE SURE YOU:   Understand these instructions.  Will watch your condition.  Will get help right away if you are not doing well or get worse. Document Released: 10/15/2005 Document Revised: 01/07/2012 Document Reviewed: 04/01/2011 [employer] Patient Information 2013 [place], [place].

## 2013-03-09 NOTE — Telephone Encounter (Signed)
Spoke with patient and she states she cannot stand the pain when she has a bowel movement. States she is using the cream and is still having severe pain only when having a bowel movement. Patient scheduled to see Doug Sou, PA today at 1:45 PM.

## 2013-03-13 ENCOUNTER — Telehealth: Payer: Self-pay | Admitting: *Deleted

## 2013-03-13 NOTE — Telephone Encounter (Signed)
Use diltiazem gel for about 10-14 days.  Colace is fine if that helps.  Thank you,  Jess

## 2013-03-13 NOTE — Telephone Encounter (Signed)
Patient notified of Jessica's recommendations.

## 2013-03-13 NOTE — Telephone Encounter (Signed)
Spoke with patient and she is asking how long she should use the Dilitazem gel. States it is helping but she was not sure. Patient will continue until she is better. She also states the Amitiza made her have nausea. She is not using it but is using Colace. States she is having regular soft bowel movements.

## 2013-03-24 ENCOUNTER — Telehealth: Payer: Self-pay | Admitting: Internal Medicine

## 2013-03-24 NOTE — Telephone Encounter (Signed)
Patient reports pain with BM. Was diagnosed with a rectal fissure by Lyla Glassing in the beginning of May.   She has been using diltiazem and has pain like a knife with BM.  She only has pain with a BM, she wants something to "numb my bottom after a BM".  I have explained to her that she needs to make sure she is getting the Diltiazem internally and not just externally, and that she can use Recticare (lidocaine) prior to insertion and after a BM to help.  I also discussed with her the importance of a high fiber diet, with lots of fluid intake and to make sure she is taking a stool softener.  She has not been very good about using the diltiazem internally.  She will try this and call back if she has continued pain or problems.

## 2013-03-24 NOTE — Telephone Encounter (Signed)
Reviewed and agree. Nupricainal ointment if she runs out of Recticare.

## 2013-06-03 ENCOUNTER — Telehealth: Payer: Self-pay | Admitting: Internal Medicine

## 2013-06-03 NOTE — Telephone Encounter (Signed)
Patient states she is having the "jumpy, jittery stomach again." She has had this for 2 days. She only has 2 "white pills" left. She has not taken the"white pills" yet. Looks like the last medication Dr. Juanda Chance ordered for patient was Bentyl and Phenobarb. She states she only tried the Bentyl. May I refill?

## 2013-06-03 NOTE — Telephone Encounter (Signed)
OK to refill bentyl.

## 2013-06-04 MED ORDER — DICYCLOMINE HCL 10 MG PO CAPS: ORAL_CAPSULE | ORAL | Status: DC

## 2013-06-04 NOTE — Telephone Encounter (Signed)
Spoke with patient and sent [facility].

## 2013-06-11 ENCOUNTER — Telehealth: Payer: Self-pay | Admitting: Internal Medicine

## 2013-06-11 NOTE — Telephone Encounter (Signed)
Spoke with patient and she states she is having abdominal pain that is different than the "jittery pain." States it work her up last night, pain across lower abdomen below the belly button. States it happens during the day also. She is taking Bentyl. She states she has had to take Miralax TID and Dulcolax which has helped her with her constipation. Offered OV with extender this week but patient wants to see Dr. Juanda Chance. Scheduled on 06/18/13 9:00 AM.

## 2013-06-18 ENCOUNTER — Ambulatory Visit (INDEPENDENT_AMBULATORY_CARE_PROVIDER_SITE_OTHER): Payer: Medicare Other | Admitting: Internal Medicine

## 2013-06-18 ENCOUNTER — Encounter: Payer: Self-pay | Admitting: Internal Medicine

## 2013-06-18 VITALS — BP 120/68 | HR 72 | Ht 63.5 in | Wt 186.0 lb

## 2013-06-18 DIAGNOSIS — K602 Anal fissure, unspecified: Secondary | ICD-10-CM

## 2013-06-18 DIAGNOSIS — K5900 Constipation, unspecified: Secondary | ICD-10-CM

## 2013-06-18 MED ORDER — MAGNESIUM OXIDE 400 MG PO TABS: ORAL_TABLET | ORAL | Status: DC

## 2013-06-18 MED ORDER — POLYETHYLENE GLYCOL 3350 17 GM/SCOOP PO POWD: 9.0000 g | Freq: Every day | ORAL | Status: DC

## 2013-06-18 NOTE — Progress Notes (Signed)
Becky Figueroa 03-26-1946 MRN [id number]   History of Present Illness:  This is a 67 year old white female with functional constipation and anal fissure which was evaluated by Doug Sou, PA-C in May 2014. She is much improved using Analpram cream. She still has constipation while taking 17 g of MiraLax every other day. Her last colonoscopy in 2006 showed anal stenosis. An upper abdominal ultrasound in December 2013 showed a common bile duct of 6.2 mm consistent with postcholecystectomy state. She had fatty liver and mild splenomegaly. Her platelet count is 116,000. She has never used excess alcohol.   Past Medical History  Diagnosis Date  . Anal stenosis   . Anxiety   . Hyperlipidemia   . IBS (irritable bowel syndrome)   . GERD (gastroesophageal reflux disease)   . Hiatal hernia   . Depression   . Fatty liver    Past Surgical History  Procedure Laterality Date  . Cholecystectomy    . Hemorrhoid surgery    . Abdominal hysterectomy    . Appendectomy    . Anal fissurectomy      reports that she has never smoked. She has never used smokeless tobacco. She reports that she does not drink alcohol or use illicit drugs. family history includes Breast cancer in her maternal aunt; Diabetes in her brother; Pancreatic cancer (age of onset: 70) in her maternal uncle. There is no history of Colon cancer. Allergies  Allergen Reactions  . Sulfonamide Derivatives     REACTION: "flushed"        Review of Systems: Occasional small amount of rectal bleeding and rectal discomfort positive for constipation  The remainder of the 10 point ROS is negative except as outlined in H&P   Physical Exam: General appearance  Well developed, in no distress. Eyes- non icteric. HEENT nontraumatic, normocephalic. Mouth no lesions, tongue papillated, no cheilosis. Neck supple without adenopathy, thyroid not enlarged, no carotid bruits, no JVD. Lungs Clear to auscultation bilaterally. Cor normal S1,  normal S2, regular rhythm, no murmur,  quiet precordium. Abdomen: Soft nontender abdomen with normal active bowel sounds. Nonspecific dermatitis in skin folds of lower abdomen. Rectal: Tight rectal sphincter consistent with stenosis. No bleeding no fissure. Extremities no pedal edema. Skin no lesions. Neurological alert and oriented x 3. Psychological normal mood and affect.  Assessment and Plan:  Problem #41 66 year old white female with anal stenosis and chronic constipation. I have asked her to change her MiraLax to 1/2 capful every day instead of 1 capful every other day. She will also add magnesium oxide 400 mg 1-2 tablets at bedtime. She will continue a high-fiber diet and Analpram cream when necessary for rectal irritation.  Problem #1 Splenomegaly. There is no history of liver disease other than fatty liver. She is status post cholecystectomy.Monitor Platelet count.   06/18/2013 [name]

## 2013-06-18 NOTE — Patient Instructions (Addendum)
We have sent the following medications to your pharmacy for you to pick up at your convenience: Miralax 1/2 capful daily  Please purchase the following medications over the counter and take as directed: Magnesium Oxide 400 mg 1-2 tablets at night  Continue Analpram  CC: Dr Carilyn Goodpasture Docia Chuck

## 2013-07-14 ENCOUNTER — Other Ambulatory Visit: Payer: Self-pay

## 2013-07-14 DIAGNOSIS — Z1231 Encounter for screening mammogram for malignant neoplasm of breast: Secondary | ICD-10-CM

## 2013-08-19 ENCOUNTER — Telehealth: Payer: Self-pay | Admitting: Internal Medicine

## 2013-08-19 NOTE — Telephone Encounter (Signed)
She was put on Xanax .5 mg tid prn, in 09/2012, OK to give Xanax .25 mg. #30 1 po q8 hrs prn stomach spasm,no refill

## 2013-08-19 NOTE — Telephone Encounter (Signed)
Patient states that she has started having the same "fluttering feeling" in her stomach that she had some time back. She states that it is not painful at all and she knows it is related to stress/anxiety associated with her husband's recent stroke. I have advised that she speak to her PCP regarding anxiety/stress. However, she states that Dr Juanda Chance gave her a "little white pill" in the past that worked. She does not know what the medication was, just that it worked (???Xanax???). Dr Juanda Chance, please advise.Marland KitchenMarland KitchenDo you want patient to discuss this with PCP or do you want to handle?

## 2013-08-20 NOTE — Telephone Encounter (Signed)
Patient states that someone else gave her xanax and she is already taking that. I have advised that if she feels that her anxiety/stress has not been managed on short term xanax, she should contact her primary care dr to discuss more long term management of the problem. She verbalizes understanding

## 2013-09-11 ENCOUNTER — Ambulatory Visit: Payer: Medicare Other

## 2013-09-25 ENCOUNTER — Ambulatory Visit
Admission: RE | Admit: 2013-09-25 | Discharge: 2013-09-25 | Disposition: A | Discharge status: Home / Self Care | Payer: Medicare Other | Source: Ambulatory Visit

## 2013-09-25 DIAGNOSIS — Z1231 Encounter for screening mammogram for malignant neoplasm of breast: Secondary | ICD-10-CM

## 2013-10-08 ENCOUNTER — Telehealth: Payer: Self-pay | Admitting: Internal Medicine

## 2013-10-08 NOTE — Telephone Encounter (Signed)
Reviewed and agree. We should update her comdition in 2 weeks ans if no better, obtain KUB to document if constipated. She also  May be due for colonoscopy, last one by Dr Randa Evens  12/2004.

## 2013-10-08 NOTE — Telephone Encounter (Signed)
Patient states she saw her PCP yesterday for constipation. Reports she has lost 17 lbs d/t not eating because she is constipated. Her PCP gave her Lactulose 15-30 cc as needed daily. She reports she took 15 cc for the last two days. She also takes Miralax BID. She reports she took Magnesium Citrate and Saturday and had lots of results but still feels she has "some in there that is not coming out." Per last OV on 05/2013, patient could take Magnesium Oxide 1-2 tablets daily. She was unaware of this and will try it. She will also increase the Lactulose to 30 cc/day as needed.

## 2013-10-09 ENCOUNTER — Telehealth: Payer: Self-pay | Admitting: Internal Medicine

## 2013-10-09 NOTE — Telephone Encounter (Signed)
Spoke with patient and told her it is Magnesium Oxide 1-2 tablets/daily.

## 2013-10-09 NOTE — Telephone Encounter (Signed)
Spoke with patient and she will call us with update in 2 weeks.

## 2013-10-14 ENCOUNTER — Encounter: Payer: Self-pay | Admitting: Nurse Practitioner

## 2013-10-14 ENCOUNTER — Telehealth: Payer: Self-pay | Admitting: Nurse Practitioner

## 2013-10-14 ENCOUNTER — Ambulatory Visit (INDEPENDENT_AMBULATORY_CARE_PROVIDER_SITE_OTHER): Payer: Medicare Other | Admitting: Nurse Practitioner

## 2013-10-14 VITALS — BP 130/70 | HR 64 | Ht 63.5 in | Wt 174.0 lb

## 2013-10-14 DIAGNOSIS — K624 Stenosis of anus and rectum: Secondary | ICD-10-CM

## 2013-10-14 DIAGNOSIS — K5900 Constipation, unspecified: Secondary | ICD-10-CM

## 2013-10-14 MED ORDER — MAGNESIUM OXIDE 400 MG PO TABS: ORAL_TABLET | ORAL | Status: DC

## 2013-10-14 MED ORDER — DILTIAZEM GEL 2 %: 1.0000 "application " | Freq: Two times a day (BID) | CUTANEOUS | Status: DC

## 2013-10-14 NOTE — Patient Instructions (Signed)
Take the Magnesium oxide tablets 400 mg, Dr. Juanda Chance prescribed in August.  Take 1-2 every night. We have given you a bowel prep to purge your bowels. Prepopik, cranberry flavored.   After you use the bowel prep, start daily taking Miralax 1-2 times daily.   Your goal is to have looser stools but not diarrhea. Please call us if you have any questions about our instructions.  Use the Diltiazem 2% gel rectally twice daily. Caution: may cause dizziness.   We scheduled the Flexible Sigmoidoscopy with Botox with Dr. [name]. [facility] on 11-18-2012. You will go to patient registration,inside the front door of the hospital. You will be getting several calls from the hospital regarding this appointment.  Directions provided.

## 2013-10-14 NOTE — Progress Notes (Signed)
I have discussed this case with Willette Cluster NP and we agreed on the above plan.

## 2013-10-15 ENCOUNTER — Telehealth: Payer: Self-pay | Admitting: Internal Medicine

## 2013-11-17 NOTE — H&P (Signed)
  History of Present Illness:  Followed by Dr. Juanda ChanceBrodie for constipation, [facility] and history of anal fissure. She takes daily colace and Miralax but doesn't feel like that's adequate. We recommended magnesium oxide tablets at her last visit but somehow this was not communicated to the patient so she never started the medication Over last two weeks patient has been more constipated than usual. Normal bowel pattern consists of 2-3 bowel movements a week, now going much less. She does fell that stool is in rectum but cannot evacuate it. No medication changes, she stays hydrated. PCP recommended 30cc MOM which she took on Tuesday without much result.  Current Medications, Allergies, Past Medical History, Past Surgical History, Family History and Social History were reviewed in [facility] electronic medical record.  Physical Exam:  General: Pleasant, well developed , white female in no acute distress  Head: Normocephalic and atraumatic  Eyes: sclerae anicteric, conjunctiva pink  Ears: Normal auditory acuity  Lungs: Clear throughout to auscultation  Heart: Regular rate and rhythm  Abdomen: Soft, non distended, non-tender. No masses, no hepatomegaly. Normal bowel sounds  Rectal: abnormally small anal opening. Attempted to check for fecal impaction using my pinky. She could not withstand DRE.The internal sphincter was very tight. Exam aborted  Musculoskeletal: Symmetrical with no gross deformities  Extremities: No edema  Neurological: Alert oriented x 4, grossly nonfocal  Psychological: Alert and cooperative. Normal mood and affect  Colonoscopy March 2006  PROCEDURE: Colonoscopy.  MEDICATIONS: Fentanyl 100 mcg, Versed 2 mg IV.  SCOPE: Olympus pediatric adjustable colonoscope.  INDICATION: A strong family history of colon cancer, history of anal stenosis.  DESCRIPTION OF PROCEDURE: The procedure had been explained to the patient and consent obtained. With the patient was in the left  lateral decubitus  position, the Olympus scope was inserted and advanced under direct visualization. The prep was excellent and able to reach the cecum. The patient did have anal stenosis and took some sedation to be able to introduce the scope. The ileocecal valve and appendiceal orifice were seen.  The scope was withdrawn. The cecum, ascending colon, transverse colon, splenic flexure, descending, and sigmoid colon seemed well upon removal. No polyps seen. The rectum and anal canal were normal. The scope was  withdrawn. The patient tolerated the procedure well.  ASSESSMENT:  1. Family history of colon cancer with negative colonoscopy, V16.0.  2. 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  PLAN:  1. Yearly Hemoccults.  2. Will recommend repeating procedure in 5 years.  Assessment and Recommendations:  68 year old female with acute on chronic constipation. Patient has a history of anal stenosis seen on colonoscopy 2006. I am concerned she could be impacted and / or has progressive anal stenosis. We recommended magnesium oxide tablets at her last visit but unfortunately this information did not make it to the patient. At this point I think we need to give her an aggressive bowel regimen and arrange for flexible sigmoidoscopy with possible dilation of anal stenosis. I discussed case with the patient's primary gastroenterologist, Dr. Juanda ChanceBrodie.  Will schedule patient for flexible sigmoidoscopy with Botox injection.  Trial of Diltiazem cream in an attempt to help relax anal sphincter.  For constipation she will start magnesium oxide tablets.  I have asked patient to continue daily MiraLax, she can increase to twice daily if needed.  Should above not relieve constipation she can add in glycerin suppositories as needed.

## 2013-11-18 ENCOUNTER — Encounter: Payer: Self-pay | Admitting: *Deleted

## 2013-11-18 ENCOUNTER — Ambulatory Visit (HOSPITAL_COMMUNITY)
Admission: RE | Admit: 2013-11-18 | Discharge: 2013-11-18 | Disposition: A | Discharge status: Home / Self Care | Payer: Medicare Other | Source: Ambulatory Visit | Attending: Internal Medicine | Admitting: Internal Medicine

## 2013-11-18 ENCOUNTER — Encounter (HOSPITAL_COMMUNITY)
Admission: RE | Disposition: A | Discharge status: Home / Self Care | Payer: Self-pay | Source: Ambulatory Visit | Attending: Internal Medicine

## 2013-11-18 ENCOUNTER — Encounter (HOSPITAL_COMMUNITY): Payer: Self-pay | Admitting: *Deleted

## 2013-11-18 DIAGNOSIS — K5900 Constipation, unspecified: Secondary | ICD-10-CM | POA: Insufficient documentation

## 2013-11-18 DIAGNOSIS — K602 Anal fissure, unspecified: Secondary | ICD-10-CM

## 2013-11-18 DIAGNOSIS — K624 Stenosis of anus and rectum: Secondary | ICD-10-CM | POA: Insufficient documentation

## 2013-11-18 DIAGNOSIS — Z800 Family history of malignant neoplasm of digestive organs: Secondary | ICD-10-CM | POA: Insufficient documentation

## 2013-11-18 DIAGNOSIS — Z79899 Other long term (current) drug therapy: Secondary | ICD-10-CM | POA: Insufficient documentation

## 2013-11-18 HISTORY — PX: BOTOX INJECTION: SHX5754

## 2013-11-18 HISTORY — PX: FLEXIBLE SIGMOIDOSCOPY: SHX5431

## 2013-11-18 SURGERY — SIGMOIDOSCOPY, FLEXIBLE
Anesthesia: Moderate Sedation

## 2013-11-18 MED ORDER — MIDAZOLAM HCL 10 MG/2ML IJ SOLN
INTRAMUSCULAR | Status: AC
  Filled 2013-11-18: qty 2

## 2013-11-18 MED ORDER — MIDAZOLAM HCL 10 MG/2ML IJ SOLN
INTRAMUSCULAR | Status: DC | PRN
  Administered 2013-11-18: 1 mg via INTRAVENOUS
  Administered 2013-11-18 (×2): 2 mg via INTRAVENOUS

## 2013-11-18 MED ORDER — SODIUM CHLORIDE 0.9 % IJ SOLN
INTRAMUSCULAR | Status: DC | PRN
  Administered 2013-11-18: 10:00:00 via SUBMUCOSAL

## 2013-11-18 MED ORDER — FENTANYL CITRATE 0.05 MG/ML IJ SOLN
INTRAMUSCULAR | Status: DC | PRN
  Administered 2013-11-18 (×3): 25 ug via INTRAVENOUS

## 2013-11-18 MED ORDER — SODIUM CHLORIDE 0.9 % IJ SOLN
INTRAMUSCULAR | Status: AC
  Filled 2013-11-18: qty 10

## 2013-11-18 MED ORDER — SODIUM CHLORIDE 0.9 % IJ SOLN
100.0000 [IU] | Freq: Once | INTRAMUSCULAR | Status: DC
  Filled 2013-11-18: qty 100

## 2013-11-18 MED ORDER — SODIUM CHLORIDE 0.9 % IV SOLN: INTRAVENOUS | Status: DC

## 2013-11-18 MED ORDER — FENTANYL CITRATE 0.05 MG/ML IJ SOLN
INTRAMUSCULAR | Status: AC
  Filled 2013-11-18: qty 2

## 2013-11-18 NOTE — Interval H&P Note (Signed)
History and Physical Interval Note:  11/18/2013 9:21 AM  Becky JuneauPatricia P Figueroa  has presented today for surgery, with the diagnosis of 564.00 Constipation 569.2 Anal stenosis  The various methods of treatment have been discussed with the patient and family. After consideration of risks, benefits and other options for treatment, the patient has consented to  Procedure(s): FLEXIBLE SIGMOIDOSCOPY/ with BOTOX (N/A) BOTOX INJECTION (N/A) as a surgical intervention .  The patient's history has been reviewed, patient examined, no change in status, stable for surgery.  I have reviewed the patient's chart and labs.  Questions were answered to the patient's satisfaction.     Lina Sarora Brodie

## 2013-11-18 NOTE — Discharge Instructions (Signed)
Colonoscopy, Care After °Refer to this sheet in the next few weeks. These instructions provide you with information on caring for yourself after your procedure. Your health care provider may also give you more specific instructions. Your treatment has been planned according to current medical practices, but problems sometimes occur. Call your health care provider if you have any problems or questions after your procedure. °WHAT TO EXPECT AFTER THE PROCEDURE  °After your procedure, it is typical to have the following: °· A small amount of blood in your stool. °· Moderate amounts of gas and mild abdominal cramping or bloating. °HOME CARE INSTRUCTIONS °· Do not drive, operate machinery, or sign important documents for 24 hours. °· You may shower and resume your regular physical activities, but move at a slower pace for the first 24 hours. °· Take frequent rest periods for the first 24 hours. °· Walk around or put a warm pack on your abdomen to help reduce abdominal cramping and bloating. °· Drink enough fluids to keep your urine clear or pale yellow. °· You may resume your normal diet as instructed by your health care provider. Avoid heavy or fried foods that are hard to digest. °· Avoid drinking alcohol for 24 hours or as instructed by your health care provider. °· Only take over-the-counter or prescription medicines as directed by your health care provider. °· If a tissue sample (biopsy) was taken during your procedure: °· Do not take aspirin or blood thinners for 7 days, or as instructed by your health care provider. °· Do not drink alcohol for 7 days, or as instructed by your health care provider. °· Eat soft foods for the first 24 hours. °SEEK MEDICAL CARE IF: °You have persistent spotting of blood in your stool 2 3 days after the procedure. °SEEK IMMEDIATE MEDICAL CARE IF: °· You have more than a small spotting of blood in your stool. °· You pass large blood clots in your stool. °· Your abdomen is swollen  (distended). °· You have nausea or vomiting. °· You have a fever. °· You have increasing abdominal pain that is not relieved with medicine. °Document Released: 05/29/2004 Document Revised: 08/05/2013 Document Reviewed: 06/22/2013 °[employer]® Patient Information ©2014 [employer]. ° °

## 2013-11-18 NOTE — Op Note (Signed)
[facility] [address]  [place], [id number]   FLEXIBLE SIGMOIDOSCOPY PROCEDURE REPORT  PATIENT: Becky Figueroa, Becky P.  MR#: [id number] BIRTHDATE: 05-24-1946 , 67  yrs. old GENDER: Female ENDOSCOPIST: Hart Carwinora M Raena Pau, MD REFERRED BY: Dr Carilyn Goodpastureibas Koirala PROCEDURE DATE:  11/18/2013 PROCEDURE:   flexible sigmoidoscopy with dilation and Botox injection ASA CLASS:   Class II INDICATIONS:constipation, anal stenosis,, 2006 colonoscopy, uses Diltiazem cream 2% MEDICATIONS: These medications were titrated to patient response per physician's verbal order, Fentanyl 100 mcg IV, and Versed 7 mg IV   DESCRIPTION OF PROCEDURE:   After the risks benefits and alternatives of the procedure were thoroughly explained, informed consent was obtained.  revealed no abnormalities of the rectum. The endoscope was introduced through the anus  and advanced to the sigmoid colon , limited by No adverse events experienced.   The quality of the prep was , large amount of stool in the rectum .  The instrument was then slowly withdrawn as the mucosa was fully examined.       there was significant anal stenosis area there was pain with digital exam. Disposable endoscope passed through the anal canal to the level of for at least 10 cm. The rectum was dilated to the size of the endoscope which is about 4 cm. There was some discomfort as well as bleeding from the anal orifice. The anal stenosis was relaxed after dilatation area next the endoscope passed into the rectum and Botox injection was carried out into the rectal muscles in 4 quadrants using 25 units of Botox in each quadrant there was bleeding from the anal canal secondary to dilatation. The anal canal without much more relaxed after the dilatation and injection the rest of the colon could not be visualized due to large amount of stool in the rectosigmoid colon[         The scope was then withdrawn from the patient and the procedure  terminated.  COMPLICATIONS: There were no complications.  ENDOSCOPIC IMPRESSION:  anal stenosis. Status post dilation with endoscope and injection of Botox of 100 units in the divided doses into 4 quadrants  RECOMMENDATIONS: Continue to use Diltiazem 2% cream Will instruct patient and I self dilatation using globs and lubricant Followup office visit 6-8 weeks Continue MiraLax and Dulcolax and stool softeners as part of the bowel regimen  REPEAT EXAM:   _______________________________ eSignedHart Carwin:  Alyviah Crandle M Manly Nestle, MD 11/18/2013 10:06 AM   CC:  PATIENT NAME:  Becky Figueroa, Becky P. MR#: [id number]

## 2013-11-19 ENCOUNTER — Encounter (HOSPITAL_COMMUNITY): Payer: Self-pay | Admitting: Internal Medicine

## 2013-11-19 ENCOUNTER — Telehealth: Payer: Self-pay | Admitting: Internal Medicine

## 2013-11-19 MED ORDER — HYDROCODONE-ACETAMINOPHEN 5-300 MG PO TABS: ORAL_TABLET | ORAL | Status: DC

## 2013-11-19 MED ORDER — TRAMADOL HCL 50 MG PO TABS: ORAL_TABLET | ORAL | Status: DC

## 2013-11-19 MED ORDER — HYDROCORTISONE ACETATE 25 MG RE SUPP: 25.0000 mg | Freq: Two times a day (BID) | RECTAL | Status: DC

## 2013-11-19 NOTE — Telephone Encounter (Signed)
Patient calling to report rectal pain and cramping. States Dr. Juanda ChanceBrodie told her to call and she would give her pain medication. Spoke with Dr. Juanda ChanceBrodie and she ordered Vicodin5/300 1-2 tablets every 4-6 hours prn pain. Hydrocortisone suppositories 25 mg rectally BID, Full liquid diet, soft diet.Spoke with patient and gave her recommendations. Rx up front for pick up(Vicodin) and suppositories rx sent to pharmacy.

## 2013-11-19 NOTE — Telephone Encounter (Signed)
Patient states Vicodin makes her sick on her stomach. She is asking if she can have Tramadol instead. Please, advise

## 2013-11-19 NOTE — Telephone Encounter (Signed)
Rx faxed to pharmacy  

## 2013-11-19 NOTE — Telephone Encounter (Signed)
OK to use tramadol 50mg , 1 or 2 po q 4 hrs prn pain,  #40, 1 refill

## 2013-11-20 ENCOUNTER — Telehealth: Payer: Self-pay | Admitting: Internal Medicine

## 2013-11-20 ENCOUNTER — Ambulatory Visit (INDEPENDENT_AMBULATORY_CARE_PROVIDER_SITE_OTHER)
Admission: RE | Admit: 2013-11-20 | Discharge: 2013-11-20 | Disposition: A | Discharge status: Home / Self Care | Payer: Medicare Other | Source: Ambulatory Visit | Attending: Internal Medicine | Admitting: Internal Medicine

## 2013-11-20 ENCOUNTER — Telehealth: Payer: Self-pay | Admitting: *Deleted

## 2013-11-20 DIAGNOSIS — R197 Diarrhea, unspecified: Secondary | ICD-10-CM

## 2013-11-20 MED ORDER — HYDROCORTISONE 1 % EX CREA: TOPICAL_CREAM | CUTANEOUS | Status: DC

## 2013-11-20 NOTE — Telephone Encounter (Signed)
Patient states "I am in the worse mess. I can't stop pooping. I poop as I walk down the hall." Patient also states she is weak. She is not taking Colace or Magnesium oxide. She states she is "raw back there."  She is taking Tramadol for pain and using Diltiazem gel. Please, advise.

## 2013-11-20 NOTE — Telephone Encounter (Signed)
Pharmacy sent correspondence that patient has no prescription drug insurance and anusol suppositories will cost $300.00. Any alternative she can have? We recently tried to send generic hydrocortisone suppositories on another patient which seemed to have been just as costly. Any other suggestions?

## 2013-11-20 NOTE — Telephone Encounter (Signed)
Spoke with patient and gave her Dr. Regino SchultzeBrodie's recommendations. She states she will try to get here today for xray but is not sure she can.

## 2013-11-20 NOTE — Telephone Encounter (Signed)
She clearly has to stop taking laxatives if she is having diarrhea, she was really impacted when we did the exam so she may be now finally getting cleaned. Please obtain KUB , also use Sitz baths, hydrocortisone cream 1% apply tid to sore rectum.

## 2013-11-20 NOTE — Telephone Encounter (Signed)
Try OTC Nupricainal ointment tid

## 2013-11-23 NOTE — Telephone Encounter (Signed)
Patient states that over the weekend, she had large bowel movements and feels much better today. She does not need nupricanal ointment at this time.

## 2013-11-24 ENCOUNTER — Telehealth: Payer: Self-pay | Admitting: Internal Medicine

## 2013-11-24 ENCOUNTER — Ambulatory Visit: Payer: Medicare Other | Admitting: Internal Medicine

## 2013-11-24 NOTE — Telephone Encounter (Signed)
Patient states she did well with bowels yesterday. Today at 2:00 PM, she had a bowel movement. She states the stool is not liquid or hard but the bowel movement just "keeps coming and makes me weak." She states she is wearing a pull up because she does not make it to the bathroom. Please, advise.

## 2013-11-24 NOTE — Telephone Encounter (Signed)
Try  Benefiber 1 tablespoon daily to bulk up the stool. She has an incompetent rectal shincter. May take Imodium prn diarrhea

## 2013-11-25 NOTE — Telephone Encounter (Signed)
Prescription done , approved by Dr. Juanda ChanceBrodie.

## 2013-11-25 NOTE — Telephone Encounter (Signed)
Patient notified of Dr. Brodie's recommendations. 

## 2013-11-26 ENCOUNTER — Ambulatory Visit (INDEPENDENT_AMBULATORY_CARE_PROVIDER_SITE_OTHER): Payer: Medicare Other | Admitting: Gastroenterology

## 2013-11-26 ENCOUNTER — Telehealth: Payer: Self-pay | Admitting: Internal Medicine

## 2013-11-26 ENCOUNTER — Encounter: Payer: Self-pay | Admitting: Gastroenterology

## 2013-11-26 VITALS — BP 120/68 | HR 84 | Ht 64.0 in

## 2013-11-26 DIAGNOSIS — R197 Diarrhea, unspecified: Secondary | ICD-10-CM

## 2013-11-26 DIAGNOSIS — R159 Full incontinence of feces: Secondary | ICD-10-CM | POA: Insufficient documentation

## 2013-11-26 MED ORDER — CHOLESTYRAMINE 4 G PO PACK: 4.0000 g | PACK | Freq: Two times a day (BID) | ORAL | Status: DC

## 2013-11-26 NOTE — Patient Instructions (Signed)
We have sent the following medications to your pharmacy for you to pick up at your convenience:  Lanetta InchQuestran  You have been given some information on Kegel Exercises to try.  You have been given some samples of Calmoseptine ointment you can use to soothe irritated areas

## 2013-11-26 NOTE — Progress Notes (Signed)
11/26/2013 Becky Figueroa [id number] April 25, 1946   History of Present Illness:  This is a pleasant 68 year old female who is well-known to Dr. Juanda ChanceBrodie. She recently, on January 21, underwent a flexible sigmoidoscopy with dilation and Botox injection for anal stenosis. Since that time she has been having a lot of diarrhea and fecal incontinence. She says that it constantly feels like she has to have a bowel movement, but sometimes nothing comes out. Other times stool comes out without her knowing or before she can make it to the bathroom. She's been wearing Depends because of the incontinence. She is here with her husband today who is very concerned as well. She was previously using MiraLax and magnesium tablets, however, she discontinued those last week. She said her bottom is becoming very sore from wiping so much. She did take only one Imodium yesterday, but that did not help. She did not try Benefiber and she was instructed previously via phone message.  She denies any abdominal pain or rectal bleeding.  Current Medications, Allergies, Past Medical History, Past Surgical History, Family History and Social History were reviewed in [facility] electronic medical record.   Physical Exam: BP 120/68  Pulse 84  Ht 5\' 4"  (1.626 m) General: Well developed white female in no acute distress; appears slightly uncomfortable. Head: Normocephalic and atraumatic. Eyes:  Sclerae anicteric, conjunctiva pink  Ears: Normal auditory acuity Lungs: Clear throughout to auscultation Heart: Regular rate and rhythm. Abdomen: Soft, non-tender and non-distended.  Normal bowel sounds. Musculoskeletal: Symmetrical with no gross deformities  Extremities: No edema  Neurological: Alert oriented x 4, grossly non-focal Psychological:  Alert and cooperative. Normal mood and affect  Assessment and Recommendations: -Diarrhea and fecal incontinence following dilation and injection of Botox for anal stenosis.  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  We will place her on Questran twice daily.  We will also give her information on Kegel exercises to try to tighten the muscles/sphincter.  Can use Imodium prn as well.  She already has a follow-up appt with Dr. Juanda ChanceBrodie at the beginning of March and she will keep that appt.

## 2013-11-26 NOTE — Progress Notes (Signed)
Reviewed and agree. Pt was having constipation and leakage of stool prior to the rectal dilation. She is now having functioning rectal sphincter but may not have the  Reflexes necessary to squeeze the anal sphincter. I will see her in follow up and consider referral for pelvic floor dysfunction.

## 2013-11-26 NOTE — Telephone Encounter (Signed)
Spoke with patient's husband and he states the patient is having loose stool every time she takes a step. States she took Imodium last night and continued to have diarrhea. He reports that she is only eating mashed potatoes and apple sauce. She has no appetite. She is weak. Scheduled with Doug SouJessica Zehr, PA at 2:00 PM today.

## 2013-12-02 ENCOUNTER — Telehealth: Payer: Self-pay | Admitting: Internal Medicine

## 2013-12-02 NOTE — Telephone Encounter (Signed)
Spoke with patient and she states she has not had a good bowel movement in several days. She is not taking Questran at this time. She will try stool softeners to see if this helps and add Magnesium oxide prn.

## 2013-12-21 ENCOUNTER — Telehealth: Payer: Self-pay | Admitting: Internal Medicine

## 2013-12-21 MED ORDER — TRAMADOL HCL 50 MG PO TABS: ORAL_TABLET | ORAL | Status: DC

## 2013-12-21 NOTE — Telephone Encounter (Signed)
Rx called into [facility] [address] as per patient's request.

## 2013-12-21 NOTE — Telephone Encounter (Signed)
Spoke with patient and she is asking for Tramadol refill. Is this ok to refill?

## 2013-12-21 NOTE — Telephone Encounter (Signed)
OK to refill tramadol x2

## 2013-12-28 NOTE — Telephone Encounter (Signed)
See previous encounter

## 2013-12-29 ENCOUNTER — Ambulatory Visit (INDEPENDENT_AMBULATORY_CARE_PROVIDER_SITE_OTHER): Payer: Medicare Other | Admitting: Internal Medicine

## 2013-12-29 ENCOUNTER — Encounter: Payer: Self-pay | Admitting: Internal Medicine

## 2013-12-29 ENCOUNTER — Telehealth: Payer: Self-pay | Admitting: *Deleted

## 2013-12-29 VITALS — BP 126/70 | HR 64 | Ht 64.0 in | Wt 159.6 lb

## 2013-12-29 DIAGNOSIS — K602 Anal fissure, unspecified: Secondary | ICD-10-CM

## 2013-12-29 DIAGNOSIS — R197 Diarrhea, unspecified: Secondary | ICD-10-CM

## 2013-12-29 DIAGNOSIS — K624 Stenosis of anus and rectum: Secondary | ICD-10-CM

## 2013-12-29 DIAGNOSIS — R159 Full incontinence of feces: Secondary | ICD-10-CM

## 2013-12-29 MED ORDER — DICYCLOMINE HCL 10 MG PO CAPS: 10.0000 mg | ORAL_CAPSULE | ORAL | Status: DC

## 2013-12-29 MED ORDER — FLUCONAZOLE 100 MG PO TABS: 100.0000 mg | ORAL_TABLET | Freq: Every day | ORAL | Status: DC

## 2013-12-29 MED ORDER — TRAMADOL HCL 50 MG PO TABS: 50.0000 mg | ORAL_TABLET | Freq: Four times a day (QID) | ORAL | Status: DC | PRN

## 2013-12-29 NOTE — Patient Instructions (Signed)
We have sent the following medications to your pharmacy for you to pick up at your convenience: Tramadol Diflucan Bentyl  Please discontinue stool softeners.  Please follow up with Dr Juanda ChanceBrodie in 8 weeks.  CC:Dr Shaune Pollackonna Gates

## 2013-12-29 NOTE — Telephone Encounter (Signed)
Patient states she called the wrong number and did not need anything.

## 2013-12-29 NOTE — Progress Notes (Signed)
Becky Figueroa 1946-02-16 [id number]  Note: This dictation was prepared with Dragon digital system. Any transcriptional errors that result from this procedure are unintentional.   History of Present Illness:  This is a 68 year old white female who is 4 weeks post Botox injection of rectal sphincter for anal spasm/stenosis which was complicated by patient developing stool leakage.after the procedure. She was actually having some leakage of stool prior to the injection. She has a history of slow transit constipation and anal stenosis which was confirmed on prior colonoscopies in 2006 and on a recent rectal exam. She also has a history of anal fissure in May 2014 for which she was taking Analpram cream. She has been on MiraLax, stool softeners and magnesium oxide. Today, she is feeling much better. She still complains of some fecal urgency and occasional incontinence. She is also complaining of rash in the groin which started as a result wearing  Diapers to prevent accidents. She is having 4-5 soft stools a day while taking stool softeners daily.    Past Medical History  Diagnosis Date  . Anal stenosis   . Anxiety   . Hyperlipidemia   . IBS (irritable bowel syndrome)   . GERD (gastroesophageal reflux disease)   . Hiatal hernia   . Depression   . Fatty liver   . Anal stenosis     Past Surgical History  Procedure Laterality Date  . Cholecystectomy    . Hemorrhoid surgery    . Abdominal hysterectomy    . Appendectomy    . Anal fissurectomy    . Flexible sigmoidoscopy N/A 11/18/2013    Procedure: FLEXIBLE SIGMOIDOSCOPY/ with BOTOX;  Surgeon: Hart Carwinora M Gates Jividen, MD;  Location: [facility];  Service: Endoscopy;  Laterality: N/A;  . Botox injection N/A 11/18/2013    Procedure: BOTOX INJECTION;  Surgeon: Hart Carwinora M Brinden Kincheloe, MD;  Location: [facility];  Service: Endoscopy;  Laterality: N/A;    Allergies  Allergen Reactions  . Sulfonamide Derivatives     REACTION: "flushed"    Family history  and social history have been reviewed.  Review of Systems: Denies rectal bleeding or abdominal pain  The remainder of the 10 point ROS is negative except as outlined in the H&P  Physical Exam: General Appearance Well developed, in no distress Eyes  Non icteric  HEENT  Non traumatic, normocephalic  Mouth No lesion, tongue papillated, no cheilosis Neck Supple without adenopathy, thyroid not enlarged, no carotid bruits, no JVD Lungs Clear to auscultation bilaterally COR Normal S1, normal S2, regular rhythm, no murmur, quiet precordium Abdomen soft nontender with normoactive bowel sounds. Well-healed surgical scars. Large area of Candida dermatitis in the right groin extending to suprapubic area, Rectal normal perianal area. Very tender rectal sphincter feels normal. No blood or fissure, small external hemorrhoidal tag Extremities  No pedal edema Skin No lesions Neurological Alert and oriented x 3 Psychological Normal mood and affect  Assessment and Plan:   Problem #731 68 year old white female with slow transit constipation who has undergone Botox injection for anal stenosis and is now having frequent stools. She will discontinue stool softener which is contributing to the diarrhea. She will also use Bentyl 10 mg every morning to decrease the fecal urgency. She needs a refill on tramadol and I have given her samples of Calmoseptine ointment to use daily.   Problem #2 Candida dermatitis in the right groin. We will start her on Diflucan 100 mg daily for 5 days. I will see her in 8 weeks for follow  up.     [name] 12/29/2013

## 2013-12-30 ENCOUNTER — Telehealth: Payer: Self-pay | Admitting: Internal Medicine

## 2013-12-30 NOTE — Telephone Encounter (Signed)
Patient states the Diflucan is causing her to have nausea. She is asking if there is a cream she can use for the rash she has. Please, advise.

## 2013-12-30 NOTE — Telephone Encounter (Signed)
She needs to take at least 3 of those pills. Take it at bedtime so she can sleep through it. Mycostatin  Cream ,15gm apply tid, is an alternative but not as effective  as the pills.

## 2013-12-31 NOTE — Telephone Encounter (Signed)
Spoke with patient and she will try taking the Diflucan at night. If this does not work, she will call me back.

## 2014-01-01 ENCOUNTER — Ambulatory Visit: Payer: Medicare Other | Admitting: Internal Medicine

## 2014-01-01 ENCOUNTER — Telehealth: Payer: Self-pay | Admitting: Internal Medicine

## 2014-01-01 NOTE — Telephone Encounter (Signed)
Spoke with patient and she stopped her Colace and now has not had a bowel movement in 3 days. She will try just taking one colace and see if this helps.

## 2014-01-08 ENCOUNTER — Telehealth: Payer: Self-pay | Admitting: *Deleted

## 2014-01-08 NOTE — Telephone Encounter (Signed)
Spoke with patient and she has nor had a bowel movement since Tuesday. She stopped the Colace as per Dr Juanda ChanceBrodie due to excessive stools. Suggested she try one dose of Miralax tonight. She will try this. She understands to use only PRN.

## 2014-01-11 ENCOUNTER — Telehealth: Payer: Self-pay | Admitting: Internal Medicine

## 2014-01-11 NOTE — Telephone Encounter (Signed)
See additional phone note. 

## 2014-01-11 NOTE — Telephone Encounter (Signed)
Pt states that the miralax did not help and she has still not had a bm. Pt states that Dr. Juanda ChanceBrodie had her stop the colace that she was taking in the am and pm because she was going to much. Instructed pt to take the colace again since it seemed to help. Pt instructed to call back if this did not help.

## 2014-01-12 ENCOUNTER — Telehealth: Payer: Self-pay | Admitting: Internal Medicine

## 2014-01-12 NOTE — Telephone Encounter (Signed)
Patient calling today to report she took one Colace and "everything broke loose."  States at 7 PM, she started going to the bathroom and was up all night and all day today. Suggested she try Miralax BID and not take Colace. She states Miralax does not help her. Also, states she cannot eat. Reports her husband told her you have to eat to poop. Patient is asking for OV before her May appointment. Scheduled her on 01/28/14 at 8:45 AM.

## 2014-01-13 ENCOUNTER — Telehealth: Payer: Self-pay | Admitting: Internal Medicine

## 2014-01-13 NOTE — Telephone Encounter (Signed)
Patient calling to report her bottom is sore because she wiped so much. States "I am sitting on a pillow with a hole in it." Having spasms like she has to "go but nothing happens." Took Dicyclomine at 12:30 PM today. She is using Tramadol also. States she did not take anything for bowels today. Please, advise.

## 2014-01-13 NOTE — Telephone Encounter (Signed)
Patient notified. She will try Balneol lotion for irritation.

## 2014-01-13 NOTE — Telephone Encounter (Signed)
I will refer her to Pelvic floor dysfunction clinic after I see her in the office

## 2014-01-15 ENCOUNTER — Telehealth: Payer: Self-pay | Admitting: Internal Medicine

## 2014-01-15 DIAGNOSIS — K624 Stenosis of anus and rectum: Secondary | ICD-10-CM

## 2014-01-15 NOTE — Telephone Encounter (Signed)
Patient calling and states"I cannot go a weekend like this. I feel like I have to go to the bathroom but nothing comes out." She has taken Bentyl which helps but still uncomfortable. No appointments with extender today. Spoke with Dr. Christella HartiganJacobs and he recommends referral to physical therapist Eulis FosterCheryl Gray for this and daily fiber. Patient given recommendations and she would like a referral. Referral made.

## 2014-01-18 ENCOUNTER — Telehealth: Payer: Self-pay | Admitting: Internal Medicine

## 2014-01-18 ENCOUNTER — Ambulatory Visit: Payer: Medicare Other | Attending: Gastroenterology | Admitting: Physical Therapy

## 2014-01-18 DIAGNOSIS — M629 Disorder of muscle, unspecified: Secondary | ICD-10-CM | POA: Insufficient documentation

## 2014-01-18 DIAGNOSIS — K602 Anal fissure, unspecified: Secondary | ICD-10-CM | POA: Insufficient documentation

## 2014-01-18 DIAGNOSIS — IMO0001 Reserved for inherently not codable concepts without codable children: Secondary | ICD-10-CM | POA: Insufficient documentation

## 2014-01-18 DIAGNOSIS — K624 Stenosis of anus and rectum: Secondary | ICD-10-CM | POA: Insufficient documentation

## 2014-01-18 DIAGNOSIS — M2420 Disorder of ligament, unspecified site: Secondary | ICD-10-CM | POA: Insufficient documentation

## 2014-01-18 DIAGNOSIS — R159 Full incontinence of feces: Secondary | ICD-10-CM | POA: Insufficient documentation

## 2014-01-18 DIAGNOSIS — R197 Diarrhea, unspecified: Secondary | ICD-10-CM | POA: Insufficient documentation

## 2014-01-18 MED ORDER — FLUCONAZOLE 100 MG PO TABS: ORAL_TABLET | ORAL | Status: DC

## 2014-01-18 NOTE — Telephone Encounter (Signed)
Rx sent to pharmacy. Patient notified. 

## 2014-01-18 NOTE — Telephone Encounter (Signed)
Please send Diflucan 100mg , #10, Take one weekly x 10 weeks.

## 2014-01-18 NOTE — Telephone Encounter (Signed)
States the rash on her stomach is back. States she took the Diflucan and it went away but it came back. Please, advise.

## 2014-01-25 ENCOUNTER — Telehealth: Payer: Self-pay | Admitting: Internal Medicine

## 2014-01-25 NOTE — Telephone Encounter (Signed)
Patient states that she has not had a bowel movement in over 1 week. I asked if she is taking fiber daily as directed and if she is drinking plenty of fluids. She states that she is. She states "well, Dr Juanda ChanceBrodie told me I can take a dulcolax tablet." I asked if she was taking this daily. She states she has not as she has a therapy appointment tomorrow and doesn't "want to have the squirts." She then states that it doesn't really give her very loose stools but causes her to have a lot of stool. I explained that if she hasn't gone to the bathroom for 1 week, then inevitably, when she takes a stool softener and her colon pushes stool through, she is going to have multiple soft bowel movements as this is what the medication is made to do. I explained that since it appears she often alternates between constipation and diarrhea, she may have to alter her bowel regimen to what is going on at the time. If she is constipated, she may have to take dulcolax. If she starts having frequent, loose stools, she will have to stop taking dulcolax. I also advised that Dr Juanda ChanceBrodie and she can discuss her alternating bowel habits at her appointment with us on Thursday. She verbalizes understanding.

## 2014-01-26 ENCOUNTER — Ambulatory Visit: Payer: Medicare Other | Admitting: Physical Therapy

## 2014-01-28 ENCOUNTER — Ambulatory Visit (INDEPENDENT_AMBULATORY_CARE_PROVIDER_SITE_OTHER): Payer: Medicare Other | Admitting: Internal Medicine

## 2014-01-28 ENCOUNTER — Encounter: Payer: Self-pay | Admitting: Internal Medicine

## 2014-01-28 VITALS — BP 110/70 | HR 70 | Ht 63.5 in | Wt 153.0 lb

## 2014-01-28 DIAGNOSIS — M6289 Other specified disorders of muscle: Secondary | ICD-10-CM

## 2014-01-28 DIAGNOSIS — K624 Stenosis of anus and rectum: Secondary | ICD-10-CM

## 2014-01-28 DIAGNOSIS — N8184 Pelvic muscle wasting: Secondary | ICD-10-CM

## 2014-01-28 MED ORDER — TRAMADOL HCL 50 MG PO TABS: ORAL_TABLET | ORAL | Status: DC

## 2014-01-28 MED ORDER — LIDOCAINE-HYDROCORTISONE ACE 2-2 % RE KIT: 1.0000 "application " | PACK | Freq: Two times a day (BID) | RECTAL | Status: DC

## 2014-01-28 NOTE — Progress Notes (Signed)
Becky Figueroa [birth year] [id number]  Note: This dictation was prepared with Dragon digital system. Any transcriptional errors that result from this procedure are unintentional.   History of Present Illness:  This is a 68 year old white female with pelvic floor dysfunction and anal stenosis. She has had chronic constipation. Patient underwent a Botox injection of the anal sphincter in January 2015 and developed postprocedure rectal leakage. She has improved now and has regular bowel movements except for occasional constipation. She has MiraLax, magnesium oxide, stool softeners and Dulcolax. She uses Calmoseptine ointment externally for rectal soreness. She has started pelvic floor exercises at a [facility]  facility for pelvic floor dysfunction which involves weekly sessions for 8 weeks. She is satisfied with the results.    Past Medical History  Diagnosis Date  . Anal stenosis   . Anxiety   . Hyperlipidemia   . IBS (irritable bowel syndrome)   . GERD (gastroesophageal reflux disease)   . Hiatal hernia   . Depression   . Fatty liver   . Anal stenosis     Past Surgical History  Procedure Laterality Date  . Cholecystectomy    . Hemorrhoid surgery    . Abdominal hysterectomy    . Appendectomy    . Anal fissurectomy    . Flexible sigmoidoscopy N/A 11/18/2013    Procedure: FLEXIBLE SIGMOIDOSCOPY/ with BOTOX;  Surgeon: Hart Carwinora M Allin Frix, MD;  Location: [facility];  Service: Endoscopy;  Laterality: N/A;  . Botox injection N/A 11/18/2013    Procedure: BOTOX INJECTION;  Surgeon: Hart Carwinora M Decari Duggar, MD;  Location: [facility];  Service: Endoscopy;  Laterality: N/A;    Allergies  Allergen Reactions  . Sulfonamide Derivatives     REACTION: "flushed"    Family history and social history have been reviewed.  Review of Systems: Weight loss of 6 pounds  The remainder of the 10 point ROS is negative except as outlined in the H&P  Physical Exam: General Appearance Well developed, in no  distress Eyes  Non icteric  HEENT  Non traumatic, normocephalic  Mouth No lesion, tongue papillated, no cheilosis Neck Supple without adenopathy, thyroid not enlarged, no carotid bruits, no JVD Lungs Clear to auscultation bilaterally COR Normal S1, normal S2, regular rhythm, no murmur, quiet precordium Abdomen soft nontender with normoactive bowel sounds. No distention. No palpable stool Rectal small external hemorrhoidal tags. Tender anal canal. Unable to complete the digital exam due to spasm and discomfort. Extremities  No pedal edema Skin No lesions Neurological Alert and oriented x 3 Psychological Normal mood and affect  Assessment and Plan:   Problem #1 Pelvic floor dysfunction and anal stenosis. She is on a laxative regimen and using a topical rectal preparation for anal fissure and irritation. She has also started exercises for pelvic relaxation which will take 8 weeks to complete. I will see her on an as necessary basis. We will refill her tramadol, 1-2 tablets when necessary for rectal pain and we will also have her to use a new prescription, Ana-Lex applied twice a day. If insurance doesn't cover that, we will switch her to an OTC 1% hydrocortisone cream to be applied twice a day.    Lina SarDora Syra Sirmons 01/28/2014

## 2014-01-28 NOTE — Patient Instructions (Signed)
We have sent the following medications to your pharmacy for you to pick up at your convenience: Analex Tramadol  Please follow up with Dr Juanda ChanceBrodie in 3 months.  CC:Dr Shaune Pollackonna Gates

## 2014-02-01 ENCOUNTER — Telehealth: Payer: Self-pay | Admitting: Internal Medicine

## 2014-02-01 NOTE — Telephone Encounter (Signed)
Patient wanted to know how to take her laxatives.  Per Dr. Regino SchultzeBrodie's note she was to continue on her current laxative regimen.  She does not take Miralax - she reports that is causes her to "be gummed up".  She states that Dr. Juanda ChanceBrodie is not want her to go more than 3 days without a BM.  She will take her colace in the am, magnesium oxide in the pm, and use the dulcolax PRN.  She will call back for any additional questions or conerns

## 2014-02-03 ENCOUNTER — Ambulatory Visit: Payer: Medicare Other | Attending: Gastroenterology | Admitting: Physical Therapy

## 2014-02-03 DIAGNOSIS — IMO0001 Reserved for inherently not codable concepts without codable children: Secondary | ICD-10-CM | POA: Insufficient documentation

## 2014-02-03 DIAGNOSIS — R159 Full incontinence of feces: Secondary | ICD-10-CM | POA: Insufficient documentation

## 2014-02-03 DIAGNOSIS — M629 Disorder of muscle, unspecified: Secondary | ICD-10-CM | POA: Insufficient documentation

## 2014-02-03 DIAGNOSIS — R197 Diarrhea, unspecified: Secondary | ICD-10-CM | POA: Insufficient documentation

## 2014-02-03 DIAGNOSIS — K624 Stenosis of anus and rectum: Secondary | ICD-10-CM | POA: Insufficient documentation

## 2014-02-03 DIAGNOSIS — K602 Anal fissure, unspecified: Secondary | ICD-10-CM | POA: Insufficient documentation

## 2014-02-03 DIAGNOSIS — M2420 Disorder of ligament, unspecified site: Secondary | ICD-10-CM | POA: Insufficient documentation

## 2014-02-08 ENCOUNTER — Ambulatory Visit: Payer: Medicare Other | Admitting: Physical Therapy

## 2014-02-08 ENCOUNTER — Telehealth: Payer: Self-pay | Admitting: Internal Medicine

## 2014-02-08 NOTE — Telephone Encounter (Signed)
Spoke with patient and she took Magnesium Oxide tablets in morning and Colace tablet at night. She has not had a bowel movement in 3 days. She will try a dose of Miralax today.

## 2014-02-09 ENCOUNTER — Telehealth: Payer: Self-pay | Admitting: Internal Medicine

## 2014-02-09 NOTE — Telephone Encounter (Signed)
Spoke with patient and discussed how to titrate medications for constipation.

## 2014-02-10 ENCOUNTER — Telehealth: Payer: Self-pay | Admitting: Internal Medicine

## 2014-02-10 ENCOUNTER — Encounter: Payer: Medicare Other | Admitting: Physical Therapy

## 2014-02-10 NOTE — Telephone Encounter (Signed)
Patient will try one Imodium for diarrhea.

## 2014-02-12 ENCOUNTER — Telehealth: Payer: Self-pay | Admitting: Internal Medicine

## 2014-02-12 NOTE — Telephone Encounter (Signed)
Spoke with patient and reviewed recommendations on last OV note.

## 2014-02-15 ENCOUNTER — Ambulatory Visit: Payer: Medicare Other | Admitting: Physical Therapy

## 2014-02-18 ENCOUNTER — Telehealth: Payer: Self-pay | Admitting: Internal Medicine

## 2014-02-18 NOTE — Telephone Encounter (Signed)
Patient states she is having diarrhea and with last bowel movement she saw bright, red blood when she wiped. Discussed that this is probably due to irritation. She will try Imodium 1/2 tablet to see if this helps diarrhea. She will call if she see's more bleeding.

## 2014-02-22 ENCOUNTER — Ambulatory Visit: Payer: Medicare Other | Admitting: Physical Therapy

## 2014-02-26 ENCOUNTER — Encounter: Payer: Self-pay | Admitting: Internal Medicine

## 2014-02-26 ENCOUNTER — Ambulatory Visit (INDEPENDENT_AMBULATORY_CARE_PROVIDER_SITE_OTHER): Payer: Medicare Other | Admitting: Internal Medicine

## 2014-02-26 VITALS — BP 108/74 | HR 104 | Ht 63.5 in | Wt 148.4 lb

## 2014-02-26 DIAGNOSIS — K5900 Constipation, unspecified: Secondary | ICD-10-CM

## 2014-02-26 DIAGNOSIS — N8184 Pelvic muscle wasting: Secondary | ICD-10-CM

## 2014-02-26 DIAGNOSIS — M6289 Other specified disorders of muscle: Secondary | ICD-10-CM

## 2014-02-26 DIAGNOSIS — K624 Stenosis of anus and rectum: Secondary | ICD-10-CM

## 2014-02-26 NOTE — Patient Instructions (Signed)
Please take colace every morning and dulcolax every evening.  You may take Miralax if you do not have a bowel movement every 3 days.  Cc:Dr Shaune Pollackonna Gates

## 2014-02-26 NOTE — Progress Notes (Signed)
Becky Figueroa [birth year] [id number]  Note: This dictation was prepared with Dragon digital system. Any transcriptional errors that result from this procedure are unintentional.   History of Present Illness:  This is a 68 year old white female with chronic constipation and periodic diarrhea after she takes laxatives for constipation.She does not seem to understand the cycle. She has a history of anal stenosis and underwent a Botox injection with dilatation of anal sphincter in January 2015. Her last appointment with us was one month ago. Her last colonoscopy was in 2006. She has called 6 times since her last appointment with concerns about constipation as well as with concerns about diarrhea. She has magnesium oxide, stool softeners, Dulcolax, and MiraLax at home. She uses diltiazem ointment. She is finishing an eight-week course of [facility]. Today, she has no specific complaints, she does not realize she has called us twice a week with bowl complaints. She feels that she is getting better and that her bowel habits are under good control.  . She has lost about 10 pounds since her last appointment, mostly because she is afraid to eat.    Past Medical History  Diagnosis Date  . Anal stenosis   . Anxiety   . Hyperlipidemia   . IBS (irritable bowel syndrome)   . GERD (gastroesophageal reflux disease)   . Hiatal hernia   . Depression   . Fatty liver   . Anal stenosis     Past Surgical History  Procedure Laterality Date  . Cholecystectomy    . Hemorrhoid surgery    . Abdominal hysterectomy    . Appendectomy    . Anal fissurectomy    . Flexible sigmoidoscopy N/A 11/18/2013    Procedure: FLEXIBLE SIGMOIDOSCOPY/ with BOTOX;  Surgeon: Hart Carwinora M Camar Guyton, MD;  Location: [facility];  Service: Endoscopy;  Laterality: N/A;  . Botox injection N/A 11/18/2013    Procedure: BOTOX INJECTION;  Surgeon: Hart Carwinora M Nitika Jackowski, MD;  Location: [facility];  Service:  Endoscopy;  Laterality: N/A;    Allergies  Allergen Reactions  . Sulfonamide Derivatives     REACTION: "flushed"    Family history and social history have been reviewed.  Review of Systems:   The remainder of the 10 point ROS is negative except as outlined in the H&P  Physical Exam: General Appearance Well developed, in no distress Eyes  Non icteric  HEENT  Non traumatic, normocephalic  Mouth No lesion, tongue papillated, no cheilosis Neck Supple without adenopathy, thyroid not enlarged, no carotid bruits, no JVD Lungs Clear to auscultation bilaterally COR Normal S1, normal S2, regular rhythm, no murmur, quiet precordium Abdomen soft nontender with normoactive bowel sounds. Scaphoid. No bruit Rectal  Not repeated Extremities  No pedal edema Skin No lesions Neurological Alert and oriented x 3 Psychological Normal mood and affect  Assessment and Plan:   Problem #1 Irritable bowel syndrome with predominant constipation and pelvic floor dysfunction. She has anal stenosis and has been on a regimen of laxatives. We have again discussed various laxatives. Specifically, she will take a stool softener every morning and dulcolax at night. If she does not have a bowel movement for 3 days, she will use MiraLax. We will  Not see her on a regular basis. Problem #2 Questionable memory problem, may need to be checked for OBS.   Hart CarwinDora M Cross Jorge 02/26/2014

## 2014-02-27 ENCOUNTER — Encounter: Payer: Self-pay | Admitting: Internal Medicine

## 2014-03-01 ENCOUNTER — Ambulatory Visit: Payer: Medicare Other | Admitting: Physical Therapy

## 2014-03-01 ENCOUNTER — Telehealth: Payer: Self-pay | Admitting: Internal Medicine

## 2014-03-01 NOTE — Telephone Encounter (Signed)
She needs to wait till 6//11/2013 for a refill.

## 2014-03-01 NOTE — Telephone Encounter (Signed)
FYI, we also just got a note from Eulis Fosterheryl Gray, PT @ [facility] stating that she was "unable to reassess patient due to not returning to physical therapy. Patient called on 03/01/14 to cancel all of her appointments with no reason and wanted to be discharged."

## 2014-03-01 NOTE — Telephone Encounter (Signed)
Dr Juanda ChanceBrodie- Patient requests additional refills of tramadol. However, she was given #60 tramadol on 01/28/14 (rx reads: Take 1-2 tablets by mouth every 6 hours as needed for pain. Rx must last 1 month). She also got an additional refill on 02/13/14 of #60. Do you want me to increase the amount she gets per month so she is not running out so quickly? Does she need to wait until 03/30/14 for another refill?

## 2014-03-02 ENCOUNTER — Telehealth: Payer: Self-pay | Admitting: Internal Medicine

## 2014-03-02 NOTE — Telephone Encounter (Signed)
I have advised patient that we cannot refill tramadol until 03-30-14 (we sent #60 with 1 rf on 01/28/14 which was to last until 03/30/14). Patient verbalizes understanding. I have also advised that she needs to reschedule her physical therapy appointment. She verbalizes understanding of this as well.

## 2014-03-04 ENCOUNTER — Telehealth: Payer: Self-pay | Admitting: Internal Medicine

## 2014-03-04 NOTE — Telephone Encounter (Signed)
Spoke with patient and she states she is having rectal pain and it feels like she has to go to the bathroom. She could not tell me what she was to do to maintain her bowels. When I reviewed it by her last OV note, she told me she was doing this and she was having bowel movements. She states she is using the Diltiazem gel BID and Lidocaine/hydrocortisone BID. She states outpatient rehab would not let her reschedule her last visit because they discharged her after she cancelled her appointments. She reports the Tramadol helps her pain but she has run out. ( she was suppose to have enough until 03/30/14 per telephone note per Vernia Bufforothy Smith, CMA) Please, advise.

## 2014-03-04 NOTE — Telephone Encounter (Signed)
Continue to use  Topical medications and return to Dr Shaune Pollackonna Gates for follow up of general medical problems.

## 2014-03-05 NOTE — Telephone Encounter (Signed)
Patient notified of Dr. Brodie's recommendations. 

## 2014-03-07 ENCOUNTER — Encounter (HOSPITAL_COMMUNITY): Payer: Self-pay | Admitting: Emergency Medicine

## 2014-03-07 ENCOUNTER — Emergency Department (HOSPITAL_COMMUNITY)
Admission: EM | Admit: 2014-03-07 | Discharge: 2014-03-07 | Disposition: A | Discharge status: Home / Self Care | Payer: Medicare Other | Attending: Emergency Medicine | Admitting: Emergency Medicine

## 2014-03-07 DIAGNOSIS — Z79899 Other long term (current) drug therapy: Secondary | ICD-10-CM | POA: Insufficient documentation

## 2014-03-07 DIAGNOSIS — Z9889 Other specified postprocedural states: Secondary | ICD-10-CM | POA: Insufficient documentation

## 2014-03-07 DIAGNOSIS — Z9089 Acquired absence of other organs: Secondary | ICD-10-CM | POA: Insufficient documentation

## 2014-03-07 DIAGNOSIS — Z90710 Acquired absence of both cervix and uterus: Secondary | ICD-10-CM | POA: Insufficient documentation

## 2014-03-07 DIAGNOSIS — F3289 Other specified depressive episodes: Secondary | ICD-10-CM | POA: Insufficient documentation

## 2014-03-07 DIAGNOSIS — F411 Generalized anxiety disorder: Secondary | ICD-10-CM | POA: Insufficient documentation

## 2014-03-07 DIAGNOSIS — R197 Diarrhea, unspecified: Secondary | ICD-10-CM | POA: Insufficient documentation

## 2014-03-07 DIAGNOSIS — K219 Gastro-esophageal reflux disease without esophagitis: Secondary | ICD-10-CM | POA: Insufficient documentation

## 2014-03-07 DIAGNOSIS — E785 Hyperlipidemia, unspecified: Secondary | ICD-10-CM | POA: Insufficient documentation

## 2014-03-07 DIAGNOSIS — R000 Tachycardia, unspecified: Secondary | ICD-10-CM | POA: Insufficient documentation

## 2014-03-07 DIAGNOSIS — F329 Major depressive disorder, single episode, unspecified: Secondary | ICD-10-CM | POA: Insufficient documentation

## 2014-03-07 DIAGNOSIS — N390 Urinary tract infection, site not specified: Secondary | ICD-10-CM | POA: Insufficient documentation

## 2014-03-07 LAB — COMPREHENSIVE METABOLIC PANEL
ALT: 13 U/L (ref 0–35)
AST: 33 U/L (ref 0–37)
Albumin: 4 g/dL (ref 3.5–5.2)
Alkaline Phosphatase: 119 U/L — ABNORMAL HIGH (ref 39–117)
BILIRUBIN TOTAL: 0.8 mg/dL (ref 0.3–1.2)
BUN: 11 mg/dL (ref 6–23)
CALCIUM: 9.5 mg/dL (ref 8.4–10.5)
CHLORIDE: 99 meq/L (ref 96–112)
CO2: 27 mEq/L (ref 19–32)
CREATININE: 0.77 mg/dL (ref 0.50–1.10)
GFR, EST NON AFRICAN AMERICAN: 84 mL/min — AB (ref >90)
Glucose, Bld: 120 mg/dL — ABNORMAL HIGH (ref 70–99)
Potassium: 4 mEq/L (ref 3.7–5.3)
Sodium: 138 mEq/L (ref 137–147)
Total Protein: 7.8 g/dL (ref 6.0–8.3)

## 2014-03-07 LAB — URINALYSIS, ROUTINE W REFLEX MICROSCOPIC
Glucose, UA: NEGATIVE mg/dL
Ketones, ur: NEGATIVE mg/dL
NITRITE: NEGATIVE
PROTEIN: NEGATIVE mg/dL
SPECIFIC GRAVITY, URINE: 1.014 (ref 1.005–1.030)
Urobilinogen, UA: 1 mg/dL (ref 0.0–1.0)
pH: 5.5 (ref 5.0–8.0)

## 2014-03-07 LAB — CBC WITH DIFFERENTIAL/PLATELET
Basophils Absolute: 0 10*3/uL (ref 0.0–0.1)
Basophils Relative: 0 % (ref 0–1)
EOS PCT: 0 % (ref 0–5)
Eosinophils Absolute: 0 10*3/uL (ref 0.0–0.7)
HEMATOCRIT: 40.5 % (ref 36.0–46.0)
HEMOGLOBIN: 14 g/dL (ref 12.0–15.0)
LYMPHS ABS: 1.4 10*3/uL (ref 0.7–4.0)
LYMPHS PCT: 21 % (ref 12–46)
MCH: 31 pg (ref 26.0–34.0)
MCHC: 34.6 g/dL (ref 30.0–36.0)
MCV: 89.6 fL (ref 78.0–100.0)
MONO ABS: 0.4 10*3/uL (ref 0.1–1.0)
MONOS PCT: 6 % (ref 3–12)
Neutro Abs: 5 10*3/uL (ref 1.7–7.7)
Neutrophils Relative %: 72 % (ref 43–77)
Platelets: 143 10*3/uL — ABNORMAL LOW (ref 150–400)
RBC: 4.52 MIL/uL (ref 3.87–5.11)
RDW: 13.7 % (ref 11.5–15.5)
WBC: 6.9 10*3/uL (ref 4.0–10.5)

## 2014-03-07 LAB — URINE MICROSCOPIC-ADD ON

## 2014-03-07 MED ORDER — DEXTROSE 5 % IV SOLN: 1.0000 g | INTRAVENOUS | Status: DC

## 2014-03-07 MED ORDER — SODIUM CHLORIDE 0.9 % IV SOLN: INTRAVENOUS | Status: DC

## 2014-03-07 MED ORDER — SODIUM CHLORIDE 0.9 % IV BOLUS (SEPSIS)
1000.0000 mL | Freq: Once | INTRAVENOUS | Status: AC
  Administered 2014-03-07: 1000 mL via INTRAVENOUS

## 2014-03-07 MED ORDER — CEPHALEXIN 500 MG PO CAPS: 500.0000 mg | ORAL_CAPSULE | Freq: Four times a day (QID) | ORAL | Status: DC

## 2014-03-07 NOTE — ED Notes (Signed)
Pt is a patient of Lina Sarora Brodie, MD.  Pt has been having GI problems since January.  For the last two months, she has had diarrhea off/on for 2 months.  Decreased appetite.  Pt does have occasional constipation also.  Abdominal Pain.  No vomiting.  No fever.

## 2014-03-07 NOTE — ED Provider Notes (Signed)
CSN: [id number]     Arrival date & time 03/07/14  1332 History   First MD Initiated Contact with Patient 03/07/14 1348     Chief Complaint  Patient presents with  . Diarrhea  . Weakness     (Consider location/radiation/quality/duration/timing/severity/associated sxs/prior Treatment) Patient is a 68 y.o. female presenting with diarrhea and weakness. The history is provided by the patient, the spouse and a relative.  Diarrhea Weakness   patient here complaining of having ongoing periods of diarrhea and constipation. Has been seen by her gastroenterologist for similar symptoms and  No diagnosis has been made.characterizes the stools as watery and without blood. No fever or vomiting. Has had some cramping. Denies any dysuria or hematuria. No recent antibiotic use. She is currently taking Lomotil without relief. She does have a history of IBS   Past Medical History  Diagnosis Date  . Anal stenosis   . Anxiety   . Hyperlipidemia   . IBS (irritable bowel syndrome)   . GERD (gastroesophageal reflux disease)   . Hiatal hernia   . Depression   . Fatty liver   . Anal stenosis    Past Surgical History  Procedure Laterality Date  . Cholecystectomy    . Hemorrhoid surgery    . Abdominal hysterectomy    . Appendectomy    . Anal fissurectomy    . Flexible sigmoidoscopy N/A 11/18/2013    Procedure: FLEXIBLE SIGMOIDOSCOPY/ with BOTOX;  Surgeon: Lafayette Dragon, MD;  Location: [facility];  Service: Endoscopy;  Laterality: N/A;  . Botox injection N/A 11/18/2013    Procedure: BOTOX INJECTION;  Surgeon: Lafayette Dragon, MD;  Location: [facility];  Service: Endoscopy;  Laterality: N/A;   Family History  Problem Relation Age of Onset  . Breast cancer Maternal Aunt     x 4  . Pancreatic cancer Maternal Uncle 56  . Colon cancer Neg Hx   . Diabetes Brother    History  Substance Use Topics  . Smoking status: Never Smoker   . Smokeless tobacco: Never Used  . Alcohol Use: No   OB History   Grav Para Term Preterm Abortions TAB SAB Ect Mult Living                 Review of Systems  Gastrointestinal: Positive for diarrhea.  Neurological: Positive for weakness.  All other systems reviewed and are negative.     Allergies  Sulfonamide derivatives  Home Medications   Prior to Admission medications   Medication Sig Start Date End Date Taking? Authorizing Provider  ALPRAZolam Duanne Moron) 0.5 MG tablet Take 0.5 mg by mouth at bedtime as needed.    Historical Provider, MD  CRESTOR 5 MG tablet Take 5 mg by mouth daily.  05/29/13   Historical Provider, MD  diltiazem 2 % GEL Apply 1 application topically 2 (two) times daily. 10/14/13   Willia Craze, NP  fluconazole (DIFLUCAN) 100 MG tablet Take one po weekly x 10 weeks 01/18/14   Lafayette Dragon, MD  lansoprazole (PREVACID) 30 MG capsule Take 30 mg by mouth daily.    Historical Provider, MD  Lidocaine-Hydrocortisone Ace 2-2 % KIT Place 1 application rectally 2 (two) times daily. 01/28/14   Lafayette Dragon, MD  traMADol (ULTRAM) 50 MG tablet Take 1-2 tablets by mouth every 6 hours as needed for pain. Rx must last 1 month 01/28/14   Lafayette Dragon, MD   BP 111/64  Pulse 110  Temp(Src) 97.7 F (36.5 C) (Oral)  Resp 16  SpO2 97% Physical Exam  Nursing note and vitals reviewed. Constitutional: She is oriented to person, place, and time. She appears well-developed and well-nourished.  Non-toxic appearance. No distress.  HENT:  Head: Normocephalic and atraumatic.  Eyes: Conjunctivae, EOM and lids are normal. Pupils are equal, round, and reactive to light.  Neck: Normal range of motion. Neck supple. No tracheal deviation present. No mass present.  Cardiovascular: Regular rhythm and normal heart sounds.  Tachycardia present.  Exam reveals no gallop.   No murmur heard. Pulmonary/Chest: Effort normal and breath sounds normal. No stridor. No respiratory distress. She has no decreased breath sounds. She has no wheezes. She has no rhonchi. She has  no rales.  Abdominal: Soft. Normal appearance and bowel sounds are normal. She exhibits no distension. There is no tenderness. There is no rigidity, no rebound, no guarding and no CVA tenderness.  Musculoskeletal: Normal range of motion. She exhibits no edema and no tenderness.  Neurological: She is alert and oriented to person, place, and time. She has normal strength. No cranial nerve deficit or sensory deficit. GCS eye subscore is 4. GCS verbal subscore is 5. GCS motor subscore is 6.  Skin: Skin is warm and dry. No abrasion and no rash noted.  Psychiatric: She has a normal mood and affect. Her speech is normal and behavior is normal.    ED Course  Procedures (including critical care time) Labs Review Labs Reviewed  CBC WITH DIFFERENTIAL  COMPREHENSIVE METABOLIC PANEL  URINALYSIS, ROUTINE W REFLEX MICROSCOPIC    Imaging Review No results found.   EKG Interpretation None      MDM   Final diagnoses:  None    Patient given IV fluids here feels better. Patient to be treated for her UTI and already has a scheduled appointment with her Dr. tomorrow    Leota Jacobsen, MD 03/07/14 [phone]

## 2014-03-07 NOTE — Discharge Instructions (Signed)
Urinary Tract Infection  Urinary tract infections (UTIs) can develop anywhere along your urinary tract. Your urinary tract is your body's drainage system for removing wastes and extra water. Your urinary tract includes two kidneys, two ureters, a bladder, and a urethra. Your kidneys are a pair of bean-shaped organs. Each kidney is about the size of your fist. They are located below your ribs, one on each side of your spine.  CAUSES  Infections are caused by microbes, which are microscopic organisms, including fungi, viruses, and bacteria. These organisms are so small that they can only be seen through a microscope. Bacteria are the microbes that most commonly cause UTIs.  SYMPTOMS   Symptoms of UTIs may vary by age and gender of the patient and by the location of the infection. Symptoms in young women typically include a frequent and intense urge to urinate and a painful, burning feeling in the bladder or urethra during urination. Older women and men are more likely to be tired, shaky, and weak and have muscle aches and abdominal pain. A fever may mean the infection is in your kidneys. Other symptoms of a kidney infection include pain in your back or sides below the ribs, nausea, and vomiting.  DIAGNOSIS  To diagnose a UTI, your caregiver will ask you about your symptoms. Your caregiver also will ask to provide a urine sample. The urine sample will be tested for bacteria and white blood cells. White blood cells are made by your body to help fight infection.  TREATMENT   Typically, UTIs can be treated with medication. Because most UTIs are caused by a bacterial infection, they usually can be treated with the use of antibiotics. The choice of antibiotic and length of treatment depend on your symptoms and the type of bacteria causing your infection.  HOME CARE INSTRUCTIONS   If you were prescribed antibiotics, take them exactly as your caregiver instructs you. Finish the medication even if you feel better after you  have only taken some of the medication.   Drink enough water and fluids to keep your urine clear or pale yellow.   Avoid caffeine, tea, and carbonated beverages. They tend to irritate your bladder.   Empty your bladder often. Avoid holding urine for long periods of time.   Empty your bladder before and after sexual intercourse.   After a bowel movement, women should cleanse from front to back. Use each tissue only once.  SEEK MEDICAL CARE IF:    You have back pain.   You develop a fever.   Your symptoms do not begin to resolve within 3 days.  SEEK IMMEDIATE MEDICAL CARE IF:    You have severe back pain or lower abdominal pain.   You develop chills.   You have nausea or vomiting.   You have continued burning or discomfort with urination.  MAKE SURE YOU:    Understand these instructions.   Will watch your condition.   Will get help right away if you are not doing well or get worse.  Document Released: 07/25/2005 Document Revised: 04/15/2012 Document Reviewed: 11/23/2011  [employer] Patient Information 2014 [employer]

## 2014-03-18 ENCOUNTER — Other Ambulatory Visit: Payer: Self-pay | Admitting: Internal Medicine

## 2014-05-04 ENCOUNTER — Ambulatory Visit (INDEPENDENT_AMBULATORY_CARE_PROVIDER_SITE_OTHER): Payer: Medicare Other | Admitting: Internal Medicine

## 2014-05-04 ENCOUNTER — Encounter: Payer: Self-pay | Admitting: Internal Medicine

## 2014-05-04 VITALS — BP 120/70 | HR 76 | Ht 63.5 in | Wt 147.5 lb

## 2014-05-04 DIAGNOSIS — K624 Stenosis of anus and rectum: Secondary | ICD-10-CM

## 2014-05-04 DIAGNOSIS — N8184 Pelvic muscle wasting: Secondary | ICD-10-CM

## 2014-05-04 DIAGNOSIS — R159 Full incontinence of feces: Secondary | ICD-10-CM

## 2014-05-04 DIAGNOSIS — M6289 Other specified disorders of muscle: Secondary | ICD-10-CM

## 2014-05-04 MED ORDER — TRAMADOL HCL 50 MG PO TABS: ORAL_TABLET | ORAL | Status: DC

## 2014-05-04 MED ORDER — ALPRAZOLAM 0.5 MG PO TABS: ORAL_TABLET | ORAL | Status: DC

## 2014-05-04 MED ORDER — HYOSCYAMINE SULFATE 0.125 MG SL SUBL: SUBLINGUAL_TABLET | SUBLINGUAL | Status: DC

## 2014-05-04 NOTE — Progress Notes (Signed)
Becky Figueroa 18-Apr-1946 [id number]  Note: This dictation was prepared with Dragon digital system. Any transcriptional errors that result from this procedure are unintentional.   History of Present Illness:  This is a 68 year old white female with pelvic floor dysfunction as well as alternating diarrhea and constipation. She overuses laxatives resulting in rectal pain and irritation. She has a history of anal stenosis, she uses Analpram  cream. A flexible sigmoidoscopy in January 2015 included dilatation and Botox injection. She has anal stenosis. She comes today with her husband who reports that patient takes laxatives in excess resulting in incontinent diarrhea, rectal pain and bleeding. She is afraid to leave the house and patient's husband feels that she has built up anxiety and obsession with her bowels. Patient reports having 4-5 good days followed by 3 days of not having any bowel movement for which she takes a laxative. If her bowels don't move the next day, she repeats the laxative only to find out that she has severe diarrhea for the next 3 days.    Past Medical History  Diagnosis Date  . Anal stenosis   . Anxiety   . Hyperlipidemia   . IBS (irritable bowel syndrome)   . GERD (gastroesophageal reflux disease)   . Hiatal hernia   . Depression   . Fatty liver   . Anal stenosis     Past Surgical History  Procedure Laterality Date  . Cholecystectomy    . Hemorrhoid surgery    . Abdominal hysterectomy    . Appendectomy    . Anal fissurectomy    . Flexible sigmoidoscopy N/A 11/18/2013    Procedure: FLEXIBLE SIGMOIDOSCOPY/ with BOTOX;  Surgeon: Hart Carwinora M Brodie, MD;  Location: [facility];  Service: Endoscopy;  Laterality: N/A;  . Botox injection N/A 11/18/2013    Procedure: BOTOX INJECTION;  Surgeon: Hart Carwinora M Brodie, MD;  Location: [facility];  Service: Endoscopy;  Laterality: N/A;    Allergies  Allergen Reactions  . Sulfonamide Derivatives Other (See Comments)     REACTION: "flushed"    Family history and social history have been reviewed.  Review of Systems:   The remainder of the 10 point ROS is negative except as outlined in the H&P  Physical Exam: General Appearance Well developed, in no distress  Psychological Normal mood and affect  Assessment and Plan:   Problem #1 We have a long discussion about laxatives and using antispasmodics as well as anxiety medication, Xanax 0.5 mg when necessary for rectal spasm. She has hydrocortisone cream at home for rectal irritation. She will also use tramadol 50 mg when necessary for rectal pain. She is up-to-date on her colonoscopy. We will see her as necessary.I suggested using Dulcolox supp or fleets enema instead of  Oral laxatives to avoid severe diarrhea.    Lina SarDora Brodie 05/04/2014

## 2014-05-04 NOTE — Patient Instructions (Signed)
We have sent the following medications to your pharmacy for you to pick up at your convenience: Xanax 1 tablet by mouth at night as needed or as needed for rectal spasm (not to exceed 3 tablets daily) Levsin SL 1 tablet every 4-6 hours as needed for rectal/colon spasms Tramadol 50 mg-Take 1 tablet by mouth every 4-6 hours as needed for rectal pain  CC:Dr Shaune Pollackonna Gates

## 2014-05-14 ENCOUNTER — Telehealth: Payer: Self-pay | Admitting: Internal Medicine

## 2014-05-14 NOTE — Telephone Encounter (Signed)
Please try Gabapentin 300mg , #90, 1 po tid, 1 refill

## 2014-05-14 NOTE — Telephone Encounter (Signed)
Patient reports that the tramadol and levsin are not helping for pain and are causing nausea.  Dr. Juanda ChanceBrodie she reports that you asked her to call back if it was not helping.  Please advise

## 2014-05-17 MED ORDER — GABAPENTIN 300 MG PO CAPS: 300.0000 mg | ORAL_CAPSULE | Freq: Three times a day (TID) | ORAL | Status: DC

## 2014-05-17 NOTE — Telephone Encounter (Signed)
Patient notified  She will call back for any additional questions or concerns 

## 2014-06-02 ENCOUNTER — Telehealth: Payer: Self-pay | Admitting: Internal Medicine

## 2014-06-02 NOTE — Telephone Encounter (Signed)
Per last OV note patient should use Dulcolax suppository or Fleets enema as needed. Patient notified of this.

## 2014-06-07 ENCOUNTER — Emergency Department (HOSPITAL_COMMUNITY): Payer: Medicare Other

## 2014-06-07 ENCOUNTER — Encounter (HOSPITAL_COMMUNITY): Payer: Self-pay | Admitting: Emergency Medicine

## 2014-06-07 ENCOUNTER — Emergency Department (HOSPITAL_COMMUNITY)
Admission: EM | Admit: 2014-06-07 | Discharge: 2014-06-08 | Disposition: A | Discharge status: Home / Self Care | Payer: Medicare Other | Attending: Emergency Medicine | Admitting: Emergency Medicine

## 2014-06-07 DIAGNOSIS — Z79899 Other long term (current) drug therapy: Secondary | ICD-10-CM | POA: Insufficient documentation

## 2014-06-07 DIAGNOSIS — F3289 Other specified depressive episodes: Secondary | ICD-10-CM | POA: Diagnosis not present

## 2014-06-07 DIAGNOSIS — F329 Major depressive disorder, single episode, unspecified: Secondary | ICD-10-CM | POA: Insufficient documentation

## 2014-06-07 DIAGNOSIS — K219 Gastro-esophageal reflux disease without esophagitis: Secondary | ICD-10-CM | POA: Insufficient documentation

## 2014-06-07 DIAGNOSIS — F05 Delirium due to known physiological condition: Secondary | ICD-10-CM

## 2014-06-07 DIAGNOSIS — Z862 Personal history of diseases of the blood and blood-forming organs and certain disorders involving the immune mechanism: Secondary | ICD-10-CM | POA: Diagnosis not present

## 2014-06-07 DIAGNOSIS — F411 Generalized anxiety disorder: Secondary | ICD-10-CM | POA: Insufficient documentation

## 2014-06-07 DIAGNOSIS — F29 Unspecified psychosis not due to a substance or known physiological condition: Secondary | ICD-10-CM | POA: Diagnosis present

## 2014-06-07 DIAGNOSIS — Z8639 Personal history of other endocrine, nutritional and metabolic disease: Secondary | ICD-10-CM | POA: Insufficient documentation

## 2014-06-07 LAB — URINALYSIS, ROUTINE W REFLEX MICROSCOPIC
Bilirubin Urine: NEGATIVE
Glucose, UA: NEGATIVE mg/dL
Hgb urine dipstick: NEGATIVE
Ketones, ur: NEGATIVE mg/dL
Leukocytes, UA: NEGATIVE
Nitrite: NEGATIVE
Protein, ur: NEGATIVE mg/dL
Specific Gravity, Urine: 1.009 (ref 1.005–1.030)
Urobilinogen, UA: 1 mg/dL (ref 0.0–1.0)
pH: 6 (ref 5.0–8.0)

## 2014-06-07 LAB — CBC
HCT: 37.1 % (ref 36.0–46.0)
Hemoglobin: 13.2 g/dL (ref 12.0–15.0)
MCH: 31.3 pg (ref 26.0–34.0)
MCHC: 35.6 g/dL (ref 30.0–36.0)
MCV: 87.9 fL (ref 78.0–100.0)
Platelets: 135 10*3/uL — ABNORMAL LOW (ref 150–400)
RBC: 4.22 MIL/uL (ref 3.87–5.11)
RDW: 13.3 % (ref 11.5–15.5)
WBC: 5.1 10*3/uL (ref 4.0–10.5)

## 2014-06-07 LAB — COMPREHENSIVE METABOLIC PANEL
ALT: 6 U/L (ref 0–35)
AST: 14 U/L (ref 0–37)
Albumin: 3.9 g/dL (ref 3.5–5.2)
Alkaline Phosphatase: 112 U/L (ref 39–117)
Anion gap: 15 (ref 5–15)
BUN: 8 mg/dL (ref 6–23)
CO2: 26 mEq/L (ref 19–32)
Calcium: 9.5 mg/dL (ref 8.4–10.5)
Chloride: 96 mEq/L (ref 96–112)
Creatinine, Ser: 0.68 mg/dL (ref 0.50–1.10)
GFR calc Af Amer: >90 mL/min (ref >90)
GFR calc non Af Amer: 88 mL/min — ABNORMAL LOW (ref >90)
Glucose, Bld: 117 mg/dL — ABNORMAL HIGH (ref 70–99)
Potassium: 3.5 mEq/L — ABNORMAL LOW (ref 3.7–5.3)
Sodium: 137 mEq/L (ref 137–147)
Total Bilirubin: 0.9 mg/dL (ref 0.3–1.2)
Total Protein: 7.6 g/dL (ref 6.0–8.3)

## 2014-06-07 NOTE — ED Notes (Signed)
Per pt report: pt took a nap at around 18:30 while her SO took a walk.  At about 19:30, husband found the pt standing but disoriented.  Pt thought it was Tuesday morning.  This was a new symptom for the pt.  Pt denies pain but does report feeling "foggy."  Pt does not feel disoriented at this time.  Pt a/o x 4.  Skin warm and dry.

## 2014-06-07 NOTE — ED Notes (Signed)
Pt aware of need for urine  

## 2014-06-08 LAB — CBG MONITORING, ED: Glucose-Capillary: 118 mg/dL — ABNORMAL HIGH (ref 70–99)

## 2014-06-08 NOTE — ED Provider Notes (Signed)
CSN: [id number]     Arrival date & time 06/07/14  2036 History   First MD Initiated Contact with Patient 06/07/14 2119     Chief Complaint  Patient presents with  . Disorientation      (Consider location/radiation/quality/duration/timing/severity/associated sxs/prior Treatment) HPI  68yF presenting with husband after period of confusion. Husband had taken dog for walk and when he returned he found wife in the kitchen. Apparently she has dish towels for each day of the week. She had placed out the towel for Tuesday. When questioned about this she thought it was Tuesday morning. Prior to this pt had fallen asleep in her chair. She reports she has an appointment with a new PCP tomorrow morning and was thinking about this before falling asleep. She really had to be convinced that it wasn't Tuesday morning to the point that husband was concerned and brought her to the ED. She otherwise seemed to be fine. Speech was clear. Answered questions related to other things appropriately. No facial droop. She currently has no complaints. Denies change in visual acuity. No numbness, tingling or loss of strength. No pain anywhere. No fever or chills. No urinary complaints. Only recent med change is PRN tramadol. Only single dose today and reports taking prior doses w/o obvious side effects.   Past Medical History  Diagnosis Date  . Anal stenosis   . Anxiety   . Hyperlipidemia   . IBS (irritable bowel syndrome)   . GERD (gastroesophageal reflux disease)   . Hiatal hernia   . Depression   . Fatty liver   . Anal stenosis    Past Surgical History  Procedure Laterality Date  . Cholecystectomy    . Hemorrhoid surgery    . Abdominal hysterectomy    . Appendectomy    . Anal fissurectomy    . Flexible sigmoidoscopy N/A 11/18/2013    Procedure: FLEXIBLE SIGMOIDOSCOPY/ with BOTOX;  Surgeon: Hart Carwin, MD;  Location: [facility];  Service: Endoscopy;  Laterality: N/A;  . Botox injection N/A 11/18/2013     Procedure: BOTOX INJECTION;  Surgeon: Hart Carwin, MD;  Location: [facility];  Service: Endoscopy;  Laterality: N/A;   Family History  Problem Relation Age of Onset  . Breast cancer Maternal Aunt     x 4  . Pancreatic cancer Maternal Uncle 56  . Colon cancer Neg Hx   . Diabetes Brother    History  Substance Use Topics  . Smoking status: Never Smoker   . Smokeless tobacco: Never Used  . Alcohol Use: No   OB History   Grav Para Term Preterm Abortions TAB SAB Ect Mult Living                 Review of Systems  All systems reviewed and negative, other than as noted in HPI.   Allergies  Sulfonamide derivatives  Home Medications   Prior to Admission medications   Medication Sig Start Date End Date Taking? Authorizing Provider  ALPRAZolam Prudy Feeler) 0.5 MG tablet Take 1 tablet by mouth at bedtime as needed for rectal spasm 05/04/14  Yes Hart Carwin, MD  CRESTOR 5 MG tablet Take 5 mg by mouth daily with breakfast.  05/29/13  Yes Historical Provider, MD  hyoscyamine (LEVSIN/SL) 0.125 MG SL tablet Place 1 tablet under the tongue every 4-6 hours as needed for colon spasm 05/04/14  Yes Hart Carwin, MD  lansoprazole (PREVACID) 30 MG capsule Take 30 mg by mouth daily.   Yes Historical Provider,  MD  Menthol-Zinc Oxide (CALMOSEPTINE) 0.44-20.625 % OINT Apply 1 application topically 2 (two) times daily as needed. Uses on bottom   Yes Historical Provider, MD  Multiple Vitamin (MULTIVITAMIN WITH MINERALS) TABS tablet Take 1 tablet by mouth daily.   Yes Historical Provider, MD  traMADol (ULTRAM) 50 MG tablet Take 1 tablet by mouth every 4-6 hours as needed for rectal pain 05/04/14  Yes Hart Carwinora M Brodie, MD   BP 105/74  Pulse 106  Temp(Src) 99 F (37.2 C) (Rectal)  Resp 15  Ht 5\' 4"  (1.626 m)  Wt 141 lb (63.957 kg)  BMI 24.19 kg/m2  SpO2 97% Physical Exam  Nursing note and vitals reviewed. Constitutional: She is oriented to person, place, and time. She appears well-developed and  well-nourished. No distress.  Laying in bed. NAD.   HENT:  Head: Normocephalic and atraumatic.  Eyes: Conjunctivae and EOM are normal. Pupils are equal, round, and reactive to light. Right eye exhibits no discharge. Left eye exhibits no discharge.  Neck: Neck supple.  No nuchal rigidity  Cardiovascular: Normal rate, regular rhythm and normal heart sounds.  Exam reveals no gallop and no friction rub.   No murmur heard. Pulmonary/Chest: Effort normal and breath sounds normal. No respiratory distress.  Abdominal: Soft. She exhibits no distension. There is no tenderness.  Musculoskeletal: She exhibits no edema and no tenderness.  Neurological: She is alert and oriented to person, place, and time. No cranial nerve deficit. She exhibits normal muscle tone. Coordination normal.  Speech clear. Content appropriate. Follows commands. Good finger to nose b/l.   Skin: Skin is warm and dry.  Psychiatric: She has a normal mood and affect. Her behavior is normal. Thought content normal.    ED Course  Procedures (including critical care time) Labs Review Labs Reviewed  CBC - Abnormal; Notable for the following:    Platelets 135 (*)    All other components within normal limits  COMPREHENSIVE METABOLIC PANEL - Abnormal; Notable for the following:    Potassium 3.5 (*)    Glucose, Bld 117 (*)    GFR calc non Af Amer 88 (*)    All other components within normal limits  CBG MONITORING, ED - Abnormal; Notable for the following:    Glucose-Capillary 118 (*)    All other components within normal limits  URINALYSIS, ROUTINE W REFLEX MICROSCOPIC    Imaging Review Ct Head Wo Contrast  06/08/2014   CLINICAL DATA:  Altered mental status.  EXAM: CT HEAD WITHOUT CONTRAST  TECHNIQUE: Contiguous axial images were obtained from the base of the skull through the vertex without intravenous contrast.  COMPARISON:  No priors.  FINDINGS: No acute intracranial abnormalities. Specifically, no evidence of acute  intracranial hemorrhage, no definite findings of acute/subacute cerebral ischemia, no mass, mass effect, hydrocephalus or abnormal intra or extra-axial fluid collections. Visualized paranasal sinuses and mastoids are well pneumatized. No acute displaced skull fractures are identified.  IMPRESSION: *No acute intracranial abnormalities. *The appearance of the brain is normal.   Electronically Signed   By: Trudie Reedaniel  Entrikin M.D.   On: 06/08/2014 00:24     EKG Interpretation   Date/Time:  Monday June 07 2014 [id number]:24 EDT Ventricular Rate:  112 PR Interval:  140 QRS Duration: 100 QT Interval:  349 QTC Calculation: 476 R Axis:   -28 Text Interpretation:  Sinus tachycardia Borderline left axis deviation  Abnormal R-wave progression, late transition No old tracing to compare  Confirmed by Mirian MoGentry, Matthew [phone]([id number]) on 06/07/2014 11:26:53 PM  MDM   Final diagnoses:  Acute confusional state    68yF with disorientation which lasted than what husband/patient felt comfortable with. No other complaints. Currently resolved. Afebrile. No hypoxia. Appears well. Nonfocal neuro exam. W/u unremarkable. Consider TIA, but my suspicion is that not. At the time she was alert, answering questions appropriately, following commands and otherwise normal aside from thinking that it was Tuesday morning. She was this way after waking up from a nap. She reports that she was thinking about her upcoming appointment with a new physician tomorrow morning just prior to the nap. I suspect that she thought she was actually waking up after sleeping through the night.  Nothing else on my exam, history or w/u is pointing towards something more sinister. I feel she is appropriate for discharge at this time. Return precautions discussed.     Raeford Razor, MD 06/08/14 [phone]

## 2014-06-08 NOTE — Discharge Instructions (Signed)
Confusion Confusion is the inability to think with your usual speed or clarity. Confusion may come on quickly or slowly over time. How quickly the confusion comes on depends on the cause. Confusion can be due to any number of causes. CAUSES   Concussion, head injury, or head trauma.  Seizures.  Stroke.  Fever.  Brain tumor.  Age related decreased brain function (dementia).  Heightened emotional states like rage or terror.  Mental illness in which the person loses the ability to determine what is real and what is not (hallucinations).  Infections such as a urinary tract infection (UTI).  Toxic effects from alcohol, drugs, or prescription medicines.  Dehydration and an imbalance of salts in the body (electrolytes).  Lack of sleep.  Low blood sugar (diabetes).  Low levels of oxygen from conditions such as chronic lung disorders.  Drug interactions or other medicine side effects.  Nutritional deficiencies, especially niacin, thiamine, vitamin C, or vitamin B.  Sudden drop in body temperature (hypothermia).  Change in routine, such as when traveling or hospitalized. SIGNS AND SYMPTOMS  People often describe their thinking as cloudy or unclear when they are confused. Confusion can also include feeling disoriented. That means you are unaware of where or who you are. You may also not know what the date or time is. If confused, you may also have difficulty paying attention, remembering, and making decisions. Some people also act aggressively when they are confused.  DIAGNOSIS  The medical evaluation of confusion may include:  Blood and urine tests.  X-rays.  Brain and nervous system tests.  Analyzing your brain waves (electroencephalogram or EEG).  Magnetic resonance imaging (MRI) of your head.  Computed tomography (CT) scan of your head.  Mental status tests in which your health care provider may ask many questions. Some of these questions may seem silly or strange,  but they are a very important test to help diagnose and treat confusion. TREATMENT  An admission to the hospital may not be needed, but a person with confusion should not be left alone. Stay with a family member or friend until the confusion clears. Avoid alcohol, pain relievers, or sedative drugs until you have fully recovered. Do not drive until directed by your health care provider. HOME CARE INSTRUCTIONS  What family and friends can do:  To find out if someone is confused, ask the person to state his or her name, age, and the date. If the person is unsure or answers incorrectly, he or she is confused.  Always introduce yourself, no matter how well the person knows you.  Often remind the person of his or her location.  Place a calendar and clock near the confused person.  Help the person with his or her medicines. You may want to use a pill box, an alarm as a reminder, or give the person each dose as prescribed.  Talk about current events and plans for the day.  Try to keep the environment calm, quiet, and peaceful.  Make sure the person keeps follow-up visits with his or her health care provider. PREVENTION  Ways to prevent confusion:  Avoid alcohol.  Eat a balanced diet.  Get enough sleep.  Take medicine only as directed by your health care provider.  Do not become isolated. Spend time with other people and make plans for your days.  Keep careful watch on your blood sugar levels if you are diabetic. SEEK IMMEDIATE MEDICAL CARE IF:   You develop severe headaches, repeated vomiting, seizures, blackouts, or   slurred speech.  There is increasing confusion, weakness, numbness, restlessness, or personality changes.  You develop a loss of balance, have marked dizziness, feel uncoordinated, or fall.  You have delusions, hallucinations, or develop severe anxiety.  Your family members think you need to be rechecked. Document Released: 11/22/2004 Document Revised: 03/01/2014  Document Reviewed: 11/20/2013 Exit[employer] Patient Information 2015 [employer]. This information is not intended to replace advice given to you by your health care provider. Make sure you discuss any questions you have with your health care provider.  

## 2014-06-17 ENCOUNTER — Telehealth: Payer: Self-pay | Admitting: Internal Medicine

## 2014-06-17 NOTE — Telephone Encounter (Signed)
Per last dictation, patient will try dulcolax suppository or fleets enema.

## 2014-06-22 ENCOUNTER — Telehealth: Payer: Self-pay | Admitting: Internal Medicine

## 2014-06-22 NOTE — Telephone Encounter (Signed)
Thid patient  Calls frequently with the same question which have been answered  Previously. She needs to call her primary MD. For contipation she has Miralax, Dulcolox, Mag citrate. For diarrhea she needs to hold off on using laxatives. She needs to manage this by herself from now on.Her husband agreed that pt is focused on her bowls.

## 2014-06-22 NOTE — Telephone Encounter (Signed)
Patient is constipated. She is taking her medication for this but the hard stools have caused rectal bleeding. She also states she is having pain in the rectal area. She is using a cream but it hurts to have a bowel movement. Please, advise.

## 2014-06-22 NOTE — Telephone Encounter (Signed)
Spoke with patient and gave her recommendations. 

## 2014-06-22 NOTE — Telephone Encounter (Signed)
Spoke with patient's husband and explained we give very little pain medications and Dr. Juanda Chance requests she call her PCP for this.

## 2014-06-28 ENCOUNTER — Encounter: Payer: Self-pay | Admitting: Internal Medicine

## 2014-06-28 NOTE — Progress Notes (Signed)
We have gotten correspondence from [facility] in [place], [place] (phone [phone]) indicating that patient has an appointment with their office on 06/29/14 @ 1:45 pm. We will update our system to indicate that patient has changed GI care.

## 2014-06-28 NOTE — Progress Notes (Signed)
I welcome another opinion. Please, provide our records to GI specialist.

## 2014-07-12 ENCOUNTER — Other Ambulatory Visit: Payer: Self-pay

## 2014-07-12 DIAGNOSIS — Z1231 Encounter for screening mammogram for malignant neoplasm of breast: Secondary | ICD-10-CM

## 2014-07-13 NOTE — Progress Notes (Signed)
Per [facility], patient no showed appointment on 06/29/14.

## 2014-07-20 ENCOUNTER — Encounter: Payer: Self-pay | Admitting: Internal Medicine

## 2014-07-20 ENCOUNTER — Ambulatory Visit: Payer: Medicare Other | Admitting: Internal Medicine

## 2014-07-20 NOTE — Progress Notes (Signed)
The patient's chart has been reviewed by Dr. Juanda Chance and the recommendations are noted below.  Dr Juanda Chance wishes for patient to get a "second opinion" from another GI physician before seeing her again.  I have spoken to the patient and when she was told she had an appointment today at 78 that she did not come to, she stated "I did?" Patient was advised that Dr Juanda Chance does think it is a good idea for her to get a second opinion from another GI dr before seeing her again. She verbalizes understanding but states that "this is the most normal my bowels have been in a year."

## 2014-09-27 ENCOUNTER — Ambulatory Visit
Admission: RE | Admit: 2014-09-27 | Discharge: 2014-09-27 | Disposition: A | Discharge status: Home / Self Care | Payer: Medicare Other | Source: Ambulatory Visit

## 2014-09-27 DIAGNOSIS — Z1231 Encounter for screening mammogram for malignant neoplasm of breast: Secondary | ICD-10-CM

## 2014-10-21 NOTE — Telephone Encounter (Signed)
Patient never came to pick up rx for vicodin at front desk. Rx has been destroyed.

## 2015-09-12 ENCOUNTER — Other Ambulatory Visit: Payer: Self-pay

## 2015-09-12 DIAGNOSIS — Z1231 Encounter for screening mammogram for malignant neoplasm of breast: Secondary | ICD-10-CM

## 2015-10-18 ENCOUNTER — Ambulatory Visit: Payer: Medicare Other

## 2015-11-16 ENCOUNTER — Ambulatory Visit
Admission: RE | Admit: 2015-11-16 | Discharge: 2015-11-16 | Disposition: A | Discharge status: Home / Self Care | Payer: Medicare Other | Source: Ambulatory Visit

## 2015-11-16 DIAGNOSIS — Z1231 Encounter for screening mammogram for malignant neoplasm of breast: Secondary | ICD-10-CM

## 2016-04-19 ENCOUNTER — Ambulatory Visit
Admission: RE | Admit: 2016-04-19 | Discharge: 2016-04-19 | Disposition: A | Discharge status: Home / Self Care | Payer: Medicare Other | Source: Ambulatory Visit | Attending: Physician Assistant | Admitting: Physician Assistant

## 2016-04-19 ENCOUNTER — Other Ambulatory Visit: Payer: Self-pay | Admitting: Physician Assistant

## 2016-04-19 DIAGNOSIS — M549 Dorsalgia, unspecified: Secondary | ICD-10-CM

## 2016-04-19 DIAGNOSIS — N200 Calculus of kidney: Secondary | ICD-10-CM

## 2016-06-01 ENCOUNTER — Other Ambulatory Visit: Payer: Self-pay | Admitting: Urology

## 2016-06-03 NOTE — H&P (Signed)
Office Visit Report     06/01/2016   --------------------------------------------------------------------------------   Becky Figueroa  MRN: [id number]  PRIMARY CARE:  [facility]ies Sil[facility], [place]  DOB: 11-09-1945, 70 year old Female  REFERRING:  Jetta Lout, NP  SSN: [id number]  PROVIDER:  Barron Alvine, M.D.    LOCATION:  [facility] [phone]   --------------------------------------------------------------------------------   CC: I have kidney stones.  HPI: Becky Figueroa is a 70 year-old female established patient who is here for renal calculi.      CC/HPI: [name] ppresents today for a follow-up visit. She has remained completely asymptomatic. She was diagnosed with a 5-6 mm stone in her distal left ureter approximately 6 weeks ago. At the time of my initial assessment probably could still see a faint calcification in the area of the distal ureter. On follow-up recently with Diane the KUB did demonstrate ongoing presence of a probable 6-7 mm distal stone based on KUB imaging. She also had ongoing evidence of hydronephrosis. Again she has remained asymptomatic and is really without complaints at this time. She also has a large left renal stone in the lower pole that we think can be left alone. The stone is 2 cm in size. He has had no ongoing gross hematuria flank pain or any other systemic complaints.     ALLERGIES: No Allergies    MEDICATIONS: Crestor 10 mg tablet  Tamsulosin Hcl 0.4 mg capsule, ext release 24 hr 1 capsule PO Q PM  Prevacid 30 mg capsule,delayed release  Xanax 0.5 mg tablet     GU PSH: None   NON-GU PSH: Bilateral Tubal Ligation Cholecystectomy Hysterectomy Tonsillectomy    GU PMH: Calculus Kidney and Ureter (Stable), Left - 05/04/2016, Calculus Kidney and Ureter - 04/20/2016 Hydronephrosis Unspec (Stable) - 05/04/2016    NON-GU PMH: Bacteriuria - 04/20/2016 Anxiety disorder, unspecified Heartburn    FAMILY HISTORY: Lung  Cancer - Father lung disease - Mother   SOCIAL HISTORY: Marital Status: Married Current Smoking Status: Patient has never smoked.  Has never drank.  Drinks 1 caffeinated drink per day. 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    REVIEW OF SYSTEMS:    GU Review Female:   Patient denies get up at night to urinate, currently pregnant, hard to postpone urination, stream starts and stops, trouble starting your stream, leakage of urine, burning /pain with urination, have to strain to urinate, and frequent urination.  Gastrointestinal (Upper):   Patient denies nausea, vomiting, and indigestion/ heartburn.  Gastrointestinal (Lower):   Patient denies diarrhea and constipation.  Constitutional:   Patient denies fever, night sweats, weight loss, and fatigue.  Skin:   Patient denies skin rash/ lesion and itching.  Eyes:   Patient denies blurred vision and double vision.  Ears/ Nose/ Throat:   Patient denies sore throat and sinus problems.  Hematologic/Lymphatic:   Patient reports easy bruising. Patient denies swollen glands.  Cardiovascular:   Patient denies leg swelling and chest pains.  Respiratory:   Patient denies cough and shortness of breath.  Endocrine:   Patient denies excessive thirst.  Musculoskeletal:   Patient denies back pain and joint pain.  Neurological:   Patient denies headaches and dizziness.  Psychologic:   Patient reports anxiety. Patient denies depression.   VITAL SIGNS:      06/01/2016 12:45 PM  BP 133/78 mmHg  Pulse 84 /min  Temperature 96.9 F / 36 C   MULTI-SYSTEM PHYSICAL EXAMINATION:    Constitutional: Well-nourished. No physical deformities. Normally  developed. Good grooming.  Neck: Neck symmetrical, not swollen. Normal tracheal position.  Respiratory: No labored breathing, no use of accessory muscles.   Cardiovascular: Normal temperature, normal extremity pulses, no swelling, no varicosities.  Skin: No paleness, no jaundice, no cyanosis. No lesion, no ulcer, no  rash.  Gastrointestinal: No mass, no tenderness, no rigidity, non obese abdomen.  Ears, Nose, Mouth, and Throat: Left ear no scars, no lesions, no masses. Right ear no scars, no lesions, no masses. Nose no scars, no lesions, no masses. Normal hearing. Normal lips.  Musculoskeletal: Normal gait and station of head and neck.     PAST DATA REVIEWED:  Source Of History:  Patient   PROCEDURES:         KUB - 74000  A single view of the abdomen is obtained.  Calculi:  2 cm left renal stone unchanged.there is a 6 mm distal left ureteral stone and also unchanged in appearance and location.               Urinalysis - 81003 Dipstick Dipstick Cont'd  Specimen: Voided Bilirubin: Neg  Color: Yellow Ketones: Neg  Appearance: Clear Blood: Neg  Specific Gravity: 1.025 Protein: Neg  pH: 5.5 Urobilinogen: 0.2  Glucose: Neg Nitrites: Neg    Leukocyte Esterase: Neg    ASSESSMENT:      ICD-10 Details  1 GU:   Calculus Kidney and Ureter - N20.2    PLAN:           Schedule Return Visit: 1-2 Weeks - Schedule Surgery          Document Letter(s):  Created for Patient: Clinical Summary         Notes:   radiographic evidence of ongoing presence of a 6 mm distal left ureteral stone. She did have some ongoing hydronephrosis when she was last seen here 3-4 weeks ago. She has not had cystoscopy for 6+ weeks at this point I think it is appropriate to recommend intervention. SHe does continue to do well clinically and her son with no urgency. This stone would be best managed with ureteroscopy and holmium laser lithotripsy in my opinion.wwe discussed the procedure risks and benefits. We will attempt to get her on the schedule for sometime in the next couple of weeks.    E & M CODE: I spent at least 25 minutes face to face with the patient, more than 50% of that time was spent on counseling and/or coordinating care.     * Signed by Barron Alvineavid Jerimie Mancuso, M.D. on 06/01/16 at 4:09 PM (EDT)*     The information  contained in this medical record document is considered private and confidential patient information. This information can only be used for the medical diagnosis and/or medical services that are being provided by the patient's selected caregivers. This information can only be distributed outside of the patient's care if the patient agrees and signs waivers of authorization for this information to be sent to an outside source or route.

## 2016-06-04 ENCOUNTER — Encounter (HOSPITAL_BASED_OUTPATIENT_CLINIC_OR_DEPARTMENT_OTHER): Payer: Self-pay | Admitting: *Deleted

## 2016-06-05 ENCOUNTER — Encounter (HOSPITAL_BASED_OUTPATIENT_CLINIC_OR_DEPARTMENT_OTHER): Payer: Self-pay | Admitting: *Deleted

## 2016-06-05 NOTE — Anesthesia Preprocedure Evaluation (Addendum)
Anesthesia Evaluation  Patient identified by MRN, date of birth, ID band Patient awake    Reviewed: Allergy & Precautions, NPO status , Patient's Chart, lab work & pertinent test results  History of Anesthesia Complications Negative for: history of anesthetic complications  Airway Mallampati: III  TM Distance: >3 FB Neck ROM: Full    Dental  (+) Dental Advisory Given, Upper Dentures   Pulmonary neg pulmonary ROS, neg shortness of breath, neg sleep apnea, neg COPD, neg recent URI,    Pulmonary exam normal breath sounds clear to auscultation       Cardiovascular (-) angina(-) Past MI, (-) Cardiac Stents and (-) Orthopnea negative cardio ROS   Rhythm:Regular Rate:Normal     Neuro/Psych neg Seizures PSYCHIATRIC DISORDERS Anxiety Depression negative neurological ROS     GI/Hepatic hiatal hernia, GERD  Medicated and Controlled,Fatty liver IBS, anal stenosis   Endo/Other  negative endocrine ROS  Renal/GU Left ureteral calculus     Musculoskeletal   Abdominal (+) + obese,   Peds  Hematology negative hematology ROS (+)   Anesthesia Other Findings HLD  Reproductive/Obstetrics                            Anesthesia Physical Anesthesia Plan  ASA: II  Anesthesia Plan: General   Post-op Pain Management:    Induction: Intravenous  Airway Management Planned: LMA  Additional Equipment:   Intra-op Plan:   Post-operative Plan:   Informed Consent: I have reviewed the patients History and Physical, chart, labs and discussed the procedure including the risks, benefits and alternatives for the proposed anesthesia with the patient or authorized representative who has indicated his/her understanding and acceptance.   Dental advisory given  Plan Discussed with:   Anesthesia Plan Comments:        Anesthesia Quick Evaluation

## 2016-06-05 NOTE — Progress Notes (Signed)
NPO AFTER MN.  ARRIVE AT 0930.  NEEDS HG.  WILL TAKE CRESTOR AND PREVACID AM DOS W/  SIPS OF WATER.

## 2016-06-06 ENCOUNTER — Ambulatory Visit (HOSPITAL_BASED_OUTPATIENT_CLINIC_OR_DEPARTMENT_OTHER): Payer: Medicare Other | Admitting: Anesthesiology

## 2016-06-06 ENCOUNTER — Ambulatory Visit (HOSPITAL_BASED_OUTPATIENT_CLINIC_OR_DEPARTMENT_OTHER)
Admission: RE | Admit: 2016-06-06 | Discharge: 2016-06-06 | Disposition: A | Discharge status: Home / Self Care | Payer: Medicare Other | Source: Ambulatory Visit | Attending: Urology | Admitting: Urology

## 2016-06-06 ENCOUNTER — Encounter (HOSPITAL_BASED_OUTPATIENT_CLINIC_OR_DEPARTMENT_OTHER)
Admission: RE | Disposition: A | Discharge status: Home / Self Care | Payer: Self-pay | Source: Ambulatory Visit | Attending: Urology

## 2016-06-06 ENCOUNTER — Encounter (HOSPITAL_BASED_OUTPATIENT_CLINIC_OR_DEPARTMENT_OTHER): Payer: Self-pay | Admitting: *Deleted

## 2016-06-06 DIAGNOSIS — Z79899 Other long term (current) drug therapy: Secondary | ICD-10-CM | POA: Insufficient documentation

## 2016-06-06 DIAGNOSIS — F329 Major depressive disorder, single episode, unspecified: Secondary | ICD-10-CM | POA: Insufficient documentation

## 2016-06-06 DIAGNOSIS — E785 Hyperlipidemia, unspecified: Secondary | ICD-10-CM | POA: Diagnosis not present

## 2016-06-06 DIAGNOSIS — Z6830 Body mass index (BMI) 30.0-30.9, adult: Secondary | ICD-10-CM | POA: Insufficient documentation

## 2016-06-06 DIAGNOSIS — E669 Obesity, unspecified: Secondary | ICD-10-CM | POA: Insufficient documentation

## 2016-06-06 DIAGNOSIS — F419 Anxiety disorder, unspecified: Secondary | ICD-10-CM | POA: Diagnosis not present

## 2016-06-06 DIAGNOSIS — N132 Hydronephrosis with renal and ureteral calculous obstruction: Secondary | ICD-10-CM | POA: Insufficient documentation

## 2016-06-06 DIAGNOSIS — K219 Gastro-esophageal reflux disease without esophagitis: Secondary | ICD-10-CM | POA: Diagnosis not present

## 2016-06-06 HISTORY — DX: Calculus of ureter: N20.1

## 2016-06-06 HISTORY — DX: Personal history of urinary calculi: Z87.442

## 2016-06-06 HISTORY — PX: HOLMIUM LASER APPLICATION: SHX5852

## 2016-06-06 HISTORY — DX: Personal history of other diseases of the digestive system: Z87.19

## 2016-06-06 HISTORY — DX: Presence of spectacles and contact lenses: Z97.3

## 2016-06-06 HISTORY — PX: CYSTOSCOPY WITH RETROGRADE PYELOGRAM, URETEROSCOPY AND STENT PLACEMENT: SHX5789

## 2016-06-06 LAB — POCT HEMOGLOBIN-HEMACUE: Hemoglobin: 13.9 g/dL (ref 12.0–15.0)

## 2016-06-06 SURGERY — CYSTOURETEROSCOPY, WITH RETROGRADE PYELOGRAM AND STENT INSERTION
Anesthesia: General | Site: Ureter | Laterality: Left

## 2016-06-06 MED ORDER — PROPOFOL 10 MG/ML IV BOLUS
INTRAVENOUS | Status: DC | PRN
Start: 2016-06-06 — End: 2016-06-06
  Administered 2016-06-06: 150 mg via INTRAVENOUS

## 2016-06-06 MED ORDER — TRAMADOL HCL 50 MG PO TABS
ORAL_TABLET | ORAL | Status: AC
Start: 2016-06-06 — End: 2016-06-06
  Filled 2016-06-06: qty 1

## 2016-06-06 MED ORDER — CEFAZOLIN SODIUM-DEXTROSE 2-4 GM/100ML-% IV SOLN
INTRAVENOUS | Status: AC
  Filled 2016-06-06: qty 100

## 2016-06-06 MED ORDER — CEFAZOLIN SODIUM-DEXTROSE 2-4 GM/100ML-% IV SOLN
2.0000 g | INTRAVENOUS | Status: AC
  Administered 2016-06-06: 2 g via INTRAVENOUS
  Filled 2016-06-06: qty 100

## 2016-06-06 MED ORDER — ONDANSETRON HCL 4 MG/2ML IJ SOLN
INTRAMUSCULAR | Status: AC
  Filled 2016-06-06: qty 2

## 2016-06-06 MED ORDER — LIDOCAINE HCL (CARDIAC) 20 MG/ML IV SOLN
INTRAVENOUS | Status: AC
  Filled 2016-06-06: qty 5

## 2016-06-06 MED ORDER — ONDANSETRON HCL 4 MG/2ML IJ SOLN
INTRAMUSCULAR | Status: DC | PRN
  Administered 2016-06-06: 4 mg via INTRAVENOUS

## 2016-06-06 MED ORDER — SODIUM CHLORIDE 0.9 % IR SOLN
Status: DC | PRN
  Administered 2016-06-06: 4000 mL

## 2016-06-06 MED ORDER — LACTATED RINGERS IV SOLN
INTRAVENOUS | Status: DC
  Administered 2016-06-06 (×2): via INTRAVENOUS
  Filled 2016-06-06: qty 1000

## 2016-06-06 MED ORDER — IOHEXOL 300 MG/ML  SOLN
INTRAMUSCULAR | Status: DC | PRN
  Administered 2016-06-06: 6 mL

## 2016-06-06 MED ORDER — FENTANYL CITRATE (PF) 100 MCG/2ML IJ SOLN
INTRAMUSCULAR | Status: DC | PRN
  Administered 2016-06-06: 50 ug via INTRAVENOUS
  Administered 2016-06-06 (×2): 25 ug via INTRAVENOUS

## 2016-06-06 MED ORDER — DEXAMETHASONE SODIUM PHOSPHATE 10 MG/ML IJ SOLN
INTRAMUSCULAR | Status: AC
  Filled 2016-06-06: qty 1

## 2016-06-06 MED ORDER — LIDOCAINE HCL 2 % EX GEL
CUTANEOUS | Status: DC | PRN
  Administered 2016-06-06: 1 via URETHRAL

## 2016-06-06 MED ORDER — LIDOCAINE HCL (CARDIAC) 20 MG/ML IV SOLN
INTRAVENOUS | Status: DC | PRN
Start: 2016-06-06 — End: 2016-06-06
  Administered 2016-06-06: 60 mg via INTRAVENOUS

## 2016-06-06 MED ORDER — MENTHOL 3 MG MT LOZG
LOZENGE | OROMUCOSAL | Status: AC
  Filled 2016-06-06: qty 9

## 2016-06-06 MED ORDER — TRAMADOL HCL 50 MG PO TABS
50.0000 mg | ORAL_TABLET | Freq: Four times a day (QID) | ORAL | Status: DC
  Administered 2016-06-06: 50 mg via ORAL
  Filled 2016-06-06: qty 1

## 2016-06-06 MED ORDER — DEXAMETHASONE SODIUM PHOSPHATE 4 MG/ML IJ SOLN
INTRAMUSCULAR | Status: DC | PRN
  Administered 2016-06-06: 5 mg via INTRAVENOUS

## 2016-06-06 MED ORDER — FENTANYL CITRATE (PF) 100 MCG/2ML IJ SOLN
INTRAMUSCULAR | Status: AC
  Filled 2016-06-06: qty 2

## 2016-06-06 MED ORDER — PROPOFOL 10 MG/ML IV BOLUS
INTRAVENOUS | Status: AC
  Filled 2016-06-06: qty 20

## 2016-06-06 SURGICAL SUPPLY — 27 items
ADAPTER CATH URET PLST 4-6FR (CATHETERS) IMPLANT
BAG DRAIN URO-CYSTO SKYTR STRL (DRAIN) ×4 IMPLANT
BASKET LASER NITINOL 1.9FR (BASKET) IMPLANT
BASKET STNLS GEMINI 4WIRE 3FR (BASKET) IMPLANT
BASKET ZERO TIP NITINOL 2.4FR (BASKET) ×4 IMPLANT
BENZOIN TINCTURE PRP APPL 2/3 (GAUZE/BANDAGES/DRESSINGS) IMPLANT
CATH INTERMIT  6FR 70CM (CATHETERS) IMPLANT
CATH URET 5FR 28IN CONE TIP (BALLOONS)
CATH URET 5FR 28IN OPEN ENDED (CATHETERS) ×4 IMPLANT
CATH URET 5FR 70CM CONE TIP (BALLOONS) IMPLANT
CLOTH BEACON ORANGE TIMEOUT ST (SAFETY) ×4 IMPLANT
DRSG TEGADERM 2-3/8X2-3/4 SM (GAUZE/BANDAGES/DRESSINGS) IMPLANT
FIBER LASER FLEXIVA 365 (UROLOGICAL SUPPLIES) ×4 IMPLANT
GLOVE BIO SURGEON STRL SZ7.5 (GLOVE) ×4 IMPLANT
GOWN STRL REUS W/ TWL XL LVL3 (GOWN DISPOSABLE) ×4 IMPLANT
GOWN STRL REUS W/TWL XL LVL3 (GOWN DISPOSABLE) ×4
GUIDEWIRE 0.038 PTFE COATED (WIRE) IMPLANT
GUIDEWIRE ANG ZIPWIRE 038X150 (WIRE) IMPLANT
GUIDEWIRE STR DUAL SENSOR (WIRE) IMPLANT
IV NS IRRIG 3000ML ARTHROMATIC (IV SOLUTION) ×8 IMPLANT
KIT ROOM TURNOVER WOR (KITS) ×4 IMPLANT
MANIFOLD NEPTUNE II (INSTRUMENTS) IMPLANT
PACK CYSTO (CUSTOM PROCEDURE TRAY) ×4 IMPLANT
SHEATH ACCESS URETERAL 38CM (SHEATH) IMPLANT
STENT URET 6FRX24 CONTOUR (STENTS) ×4 IMPLANT
TUBE CONNECTING 12'X1/4 (SUCTIONS)
TUBE CONNECTING 12X1/4 (SUCTIONS) IMPLANT

## 2016-06-06 NOTE — Transfer of Care (Signed)
Last Vitals:  Vitals:   06/06/16 0932  BP: (!) 149/83  Pulse: 85  Resp: 18  Temp: 36.6 C    Last Pain:  Vitals:   06/06/16 0932  TempSrc: Oral      Patients Stated Pain Goal: 5 (06/06/16 0948)  Immediate Anesthesia Transfer of Care Note  Patient: Becky Figueroa  Procedure(s) Performed: Procedure(s) (LRB): CYSTOSCOPY WITH RETROGRADE PYELOGRAM, URETEROSCOPY AND STENT PLACEMENT (Left) HOLMIUM LASER APPLICATION (Left)  Patient Location: PACU  Anesthesia Type: General  Level of Consciousness: awake, alert  and oriented  Airway & Oxygen Therapy: Patient Spontanous Breathing and Patient connected to face mask oxygen  Post-op Assessment: Report given to PACU RN and Post -op Vital signs reviewed and stable  Post vital signs: Reviewed and stable  Complications: No apparent anesthesia complications

## 2016-06-06 NOTE — Anesthesia Procedure Notes (Signed)
Procedure Name: LMA Insertion Date/Time: 06/06/2016 11:32 AM Performed by: Linton RumpALLAN, JENNIFER DICKERSON Pre-anesthesia Checklist: Patient identified, Emergency Drugs available, Suction available and Patient being monitored Patient Re-evaluated:Patient Re-evaluated prior to inductionOxygen Delivery Method: Circle system utilized Preoxygenation: Pre-oxygenation with 100% oxygen Intubation Type: IV induction Ventilation: Mask ventilation without difficulty LMA: LMA inserted LMA Size: 4.0 Number of attempts: 2 Placement Confirmation: positive ETCO2 Tube secured with: Tape Dental Injury: Teeth and Oropharynx as per pre-operative assessment  Comments: LMA not seating well with first insertion attempt. LMA removed and repositioned with good result. TV of 300. Dr. Isaias CowmanAllan present.

## 2016-06-06 NOTE — Discharge Instructions (Addendum)
[facility] [phone] Post Ureteroscopy With or Without Stent Instructions  Definitions:  Ureter: The duct that transports urine from the kidney to the bladder. Stent:   A plastic hollow tube that is placed into the ureter, from the kidney to the                 bladder to prevent the ureter from swelling shut.  GENERAL INSTRUCTIONS:  Despite the fact that no skin incisions were used, the area around the ureter and bladder is raw and irritated. The stent is a foreign body which will further irritate the bladder wall. This irritation is manifested by increased frequency of urination, both day and night, and by an increase in the urge to urinate. In some, the urge to urinate is present almost always. Sometimes the urge is strong enough that you may not be able to stop yourself from urinating. The only real cure is to remove the stent and then give time for the bladder wall to heal which can't be done until the danger of the ureter swelling shut has passed, which varies.  You may see some blood in your urine while the stent is in place and a few days afterwards. Do not be alarmed, even if the urine was clear for a while. Get off your feet and drink lots of fluids until clearing occurs. If you start to pass clots or don't improve, call us.  DIET: You may return to your normal diet immediately. Because of the raw surface of your bladder, alcohol, spicy foods, acid type foods and drinks with caffeine may cause irritation or frequency and should be used in moderation. To keep your urine flowing freely and to avoid constipation, drink plenty of fluids during the day ( 8-10 glasses ). Tip: Avoid cranberry juice because it is very acidic.  ACTIVITY: Your physical activity doesn't need to be restricted. However, if you are very active, you may see some blood in your urine. We suggest that you reduce your activity under these circumstances until the bleeding has stopped.  BOWELS: It is  important to keep your bowels regular during the postoperative period. Straining with bowel movements can cause bleeding. A bowel movement every other day is reasonable. Use a mild laxative if needed, such as Milk of Magnesia 2-3 tablespoons, or 2 Dulcolax tablets. Call if you continue to have problems. If you have been taking narcotics for pain, before, during or after your surgery, you may be constipated. Take a laxative if necessary.   MEDICATION: You should resume your pre-surgery medications unless told not to. In addition you will often be given an antibiotic to prevent infection. These should be taken as prescribed until the bottles are finished unless you are having an unusual reaction to one of the drugs.  PROBLEMS YOU SHOULD REPORT TO [place]:  Fevers over 100.5 Fahrenheit.  Heavy bleeding, or clots ( See above notes about blood in urine ).  Inability to urinate.  Drug reactions ( hives, rash, nausea, vomiting, diarrhea ).  Severe burning or pain with urination that is not improving.  FOLLOW-UP: You will need a follow-up appointment to monitor your progress and to remove the stent but senior kidney. This will need to be done in about a week our office will call you to set that up   Post Anesthesia Home Care Instructions  Activity: Get plenty of rest for the remainder of the day. A responsible adult should stay with you for 24 hours following the procedure.  For the next 24 hours, DO NOT: -Drive a car -Advertising copywriterperate machinery -Drink alcoholic beverages -Take any medication unless instructed by your physician -Make any legal decisions or sign important papers.  Meals: Start with liquid foods such as gelatin or soup. Progress to regular foods as tolerated. Avoid greasy, spicy, heavy foods. If nausea and/or vomiting occur, drink only clear liquids until the nausea and/or vomiting subsides. Call your physician if vomiting continues.  Special Instructions/Symptoms: Your throat may feel  dry or sore from the anesthesia or the breathing tube placed in your throat during surgery. If this causes discomfort, gargle with warm salt water. The discomfort should disappear within 24 hours.  If you had a scopolamine patch placed behind your ear for the management of post- operative nausea and/or vomiting:  1. The medication in the patch is effective for 72 hours, after which it should be removed.  Wrap patch in a tissue and discard in the trash. Wash hands thoroughly with soap and water. 2. You may remove the patch earlier than 72 hours if you experience unpleasant side effects which may include dry mouth, dizziness or visual disturbances. 3. Avoid touching the patch. Wash your hands with soap and water after contact with the patch.

## 2016-06-06 NOTE — Anesthesia Postprocedure Evaluation (Signed)
Anesthesia Post Note  Patient: Becky Figueroa  Procedure(s) Performed: Procedure(s) (LRB): CYSTOSCOPY WITH RETROGRADE PYELOGRAM, URETEROSCOPY AND STENT PLACEMENT (Left) HOLMIUM LASER APPLICATION (Left)  Patient location during evaluation: PACU Anesthesia Type: General Level of consciousness: awake and alert Pain management: pain level controlled Vital Signs Assessment: post-procedure vital signs reviewed and stable Respiratory status: spontaneous breathing, nonlabored ventilation and respiratory function stable Cardiovascular status: blood pressure returned to baseline and stable Postop Assessment: no signs of nausea or vomiting Anesthetic complications: no    Last Vitals:  Vitals:   06/06/16 0932 06/06/16 1221  BP: (!) 149/83 (!) 156/80  Pulse: 85 90  Resp: 18 19  Temp: 36.6 C 36.6 C    Last Pain:  Vitals:   06/06/16 0932  TempSrc: Oral                 Linton RumpJennifer Dickerson Mckenzie Toruno

## 2016-06-06 NOTE — Interval H&P Note (Signed)
History and Physical Interval Note:  06/06/2016 10:55 AM  Becky Figueroa  has presented today for surgery, with the diagnosis of LEFT URETERAL CALCULUS  The various methods of treatment have been discussed with the patient and family. After consideration of risks, benefits and other options for treatment, the patient has consented to  Procedure(s): CYSTOSCOPY WITH RETROGRADE PYELOGRAM, URETEROSCOPY AND STENT PLACEMENT (Left) HOLMIUM LASER APPLICATION (Left) as a surgical intervention .  The patient's history has been reviewed, patient examined, no change in status, stable for surgery.  I have reviewed the patient's chart and labs.  Questions were answered to the patient's satisfaction.     [name]

## 2016-06-06 NOTE — Op Note (Signed)
Preoperative diagnosis: left ureteral calculus  Postoperative diagnosis: left ureteral calculus  Procedure:  1. Cystoscopy 2. left ureteroscopy and stone removal 3. Ureteroscopic laser lithotripsy 4. left ureteral stent placement ([id number]) 24cm no string 5. left retrograde pyelography with interpretation  Surgeon: Valetta Fulleravid S. Lavere Shinsky, MD  Anesthesia: General  Complications: None  Intraoperative findings: left retrograde pyelography demonstrated a filling defect within the left ureter consistent with the patient's known calculus without other abnormalities.  EBL: Minimal  Specimens: 1. left ureteral calculus  Disposition of specimens: [facility] for stone analysis  Indication: Becky Figueroa is a 70 y.o.   patient with urolithiasis. After reviewing the management options for treatment, the patient elected to proceed with the above surgical procedure(s). We have discussed the potential benefits and risks of the procedure, side effects of the proposed treatment, the likelihood of the patient achieving the goals of the procedure, and any potential problems that might occur during the procedure or recuperation. Informed consent has been obtained.  Description of procedure:  The patient was taken to the operating room and general anesthesia was induced.  The patient was placed in the dorsal lithotomy position, prepped and draped in the usual sterile fashion, and preoperative antibiotics were administered. A preoperative time-out was performed.   Cystourethroscopy was performed.  The patient's urethra was examined and was normal The bladder was then systematically examined in its entirety. There was no evidence for any bladder tumors, stones, or other mucosal pathology.    Attention then turned to the left ureteral orifice and a ureteral catheter was used to intubate the ureteral orifice.  Omnipaque contrast was injected through the ureteral catheter and a retrograde  pyelogram was performed with findings as dictated above.  A 0.38 sensor guidewire was then advanced up the left ureter into the renal pelvis under fluoroscopic guidance. The 6 Fr semirigid ureteroscope was then advanced into the ureter next to the guidewire and the calculus was identified.   The stone was then fragmented with the 365 micron holmium laser fiber on a setting of 0.8J and frequency of 8 Hz.   All stones were then removed from the ureter with a zero tip nitinol basket.  Reinspection of the ureter revealed no remaining visible stones or fragments.   The wire was then backloaded through the cystoscope and a ureteral stent was advance over the wire using Seldinger technique.  The stent was positioned appropriately under fluoroscopic and cystoscopic guidance.  The wire was then removed with an adequate stent curl noted in the renal pelvis as well as in the bladder.  The bladder was then emptied and the procedure ended.  The patient appeared to tolerate the procedure well and without complications.  The patient was able to be awakened and transferred to the recovery unit in satisfactory condition.

## 2016-06-07 ENCOUNTER — Encounter (HOSPITAL_BASED_OUTPATIENT_CLINIC_OR_DEPARTMENT_OTHER): Payer: Self-pay | Admitting: Urology

## 2016-11-19 ENCOUNTER — Other Ambulatory Visit: Payer: Self-pay | Admitting: Family Medicine

## 2016-11-19 DIAGNOSIS — Z1231 Encounter for screening mammogram for malignant neoplasm of breast: Secondary | ICD-10-CM

## 2016-12-11 ENCOUNTER — Ambulatory Visit: Payer: Medicare Other

## 2017-01-09 ENCOUNTER — Ambulatory Visit
Admission: RE | Admit: 2017-01-09 | Discharge: 2017-01-09 | Disposition: A | Discharge status: Home / Self Care | Payer: Medicare Other | Source: Ambulatory Visit | Attending: Family Medicine | Admitting: Family Medicine

## 2017-01-09 DIAGNOSIS — Z1231 Encounter for screening mammogram for malignant neoplasm of breast: Secondary | ICD-10-CM

## 2017-09-05 ENCOUNTER — Emergency Department (HOSPITAL_COMMUNITY)
Admission: EM | Admit: 2017-09-05 | Discharge: 2017-09-05 | Disposition: A | Discharge status: Home / Self Care | Payer: Medicare Other | Attending: Emergency Medicine | Admitting: Emergency Medicine

## 2017-09-05 ENCOUNTER — Emergency Department (HOSPITAL_COMMUNITY): Payer: Medicare Other

## 2017-09-05 ENCOUNTER — Encounter (HOSPITAL_COMMUNITY): Payer: Self-pay | Admitting: Emergency Medicine

## 2017-09-05 ENCOUNTER — Other Ambulatory Visit: Payer: Self-pay

## 2017-09-05 DIAGNOSIS — Y999 Unspecified external cause status: Secondary | ICD-10-CM | POA: Diagnosis not present

## 2017-09-05 DIAGNOSIS — S0083XA Contusion of other part of head, initial encounter: Secondary | ICD-10-CM | POA: Insufficient documentation

## 2017-09-05 DIAGNOSIS — Y9301 Activity, walking, marching and hiking: Secondary | ICD-10-CM | POA: Diagnosis not present

## 2017-09-05 DIAGNOSIS — Y929 Unspecified place or not applicable: Secondary | ICD-10-CM | POA: Diagnosis not present

## 2017-09-05 DIAGNOSIS — W0110XA Fall on same level from slipping, tripping and stumbling with subsequent striking against unspecified object, initial encounter: Secondary | ICD-10-CM | POA: Diagnosis not present

## 2017-09-05 DIAGNOSIS — R4182 Altered mental status, unspecified: Secondary | ICD-10-CM | POA: Diagnosis present

## 2017-09-05 DIAGNOSIS — W19XXXA Unspecified fall, initial encounter: Secondary | ICD-10-CM

## 2017-09-05 NOTE — ED Notes (Signed)
Bed: WA06 Expected date:  Expected time:  Means of arrival:  Comments: 

## 2017-09-05 NOTE — ED Notes (Signed)
Patient given water

## 2017-09-05 NOTE — ED Notes (Signed)
Son reports that pt fell on Sunday and is concerned about her mental state. He said she has been more confused than normal and that she has been more clumsy and "almost delirious". Pt's son wants her to be evaluated and he called her primary care doctor who instructed him to come to the Emergency Room.  No formal dx of dementia has been given at this time per son. Pt denies any urinary symptoms at this time

## 2017-09-05 NOTE — ED Triage Notes (Signed)
Pt from home with son requesting evaluation related to fall on Sunday; bruise noted beneath left eye. Pt alert and oriented per normal (knows day but not date) but "forgetful at times."

## 2017-09-05 NOTE — ED Notes (Signed)
Patient transported to CT 

## 2017-09-05 NOTE — ED Notes (Signed)
ED Provider at bedside. 

## 2017-09-05 NOTE — ED Provider Notes (Signed)
[facility]-EMERGENCY DEPT Provider Note  CSN: [id number] Arrival date & time: 09/05/17 1224  Chief Complaint(s) Fall and Altered Mental Status  HPI Becky Figueroa is a 71 y.o. female who presents to the emergency department  for evaluation after having a mechanical fall that occurred 4 days prior to arrival.  Patient and son report that the patient was walking to the bathroom when she stepped on the blanket that she was holding causing her to fall and hit her face.  She denied any loss of consciousness or amnesia to the event.  She reported mild headache which has since resolved 3 days ago.  Also reported nausea and vomiting on day 2 which again has resolved.  Patient sustained a contusion to the left cheek.  Patient reports that she is been feeling a little fuzzy headed since the fall.  Thinks that she has been having some memory issues which her son reports are chronic and at her baseline.  They deny any focal weakness, trouble ambulating.  No visual changes.   No other physical injuries noted.  HPI  Past Medical History Past Medical History:  Diagnosis Date  . Anal stenosis   . Anxiety   . Depression   . Fatty liver   . GERD (gastroesophageal reflux disease)   . Hiatal hernia   . History of anal fissures   . History of chronic constipation   . History of kidney stones    1973  . Hyperlipidemia   . Left ureteral calculus   . Wears glasses    Patient Active Problem List   Diagnosis Date Noted  . Diarrhea 11/26/2013  . Fecal incontinence 11/26/2013  . 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 Anal fissure 03/09/2013  . Unspecified constipation 03/09/2013  . ANXIETY 08/01/2010  . GERD 08/01/2010  . DYSPEPSIA 08/01/2010  . IRRITABLE BOWEL SYNDROME 08/01/2010   Home Medication(s) Prior to Admission medications   Medication Sig Start Date End Date Taking? Authorizing Provider  acetaminophen (TYLENOL) 325 MG tablet Take 650 mg every 6 (six) hours as needed by mouth  for headache.   Yes [provider]  ALPRAZolam Prudy Feeler) 0.5 MG tablet Take 0.5 mg by mouth 3 (three) times daily as needed for anxiety.   Yes [provider]  CRESTOR 5 MG tablet Take 5 mg by mouth every morning.  05/29/13  Yes [provider]  lansoprazole (PREVACID) 30 MG capsule Take 30 mg by mouth every morning.    Yes [provider]  Multiple Vitamins-Minerals (ONE-A-DAY WOMENS 50 PLUS PO) Take 1 tablet by mouth daily.   Yes [provider]  polyethylene glycol (MIRALAX / GLYCOLAX) packet Take 17 g daily by mouth.   Yes [provider]  traMADol (ULTRAM) 50 MG tablet Take 50 mg by mouth every 6 (six) hours as needed.   Yes [provider]  Past Surgical History Past Surgical History:  Procedure Laterality Date  . ANAL FISSURECTOMY  1994   and Hemorrhoidectomy  . CHOLECYSTECTOMY OPEN  1982  . TONSILLECTOMY  age 70  . TOTAL ABDOMINAL HYSTERECTOMY W/ BILATERAL SALPINGOOPHORECTOMY  1984   and Appendectomy   Family History Family History  Problem Relation Age of Onset  . Breast cancer Maternal Aunt        x 4  . Pancreatic cancer Maternal Uncle 56  . Diabetes Brother   . Colon cancer Neg Hx     Social History Social History   Tobacco Use  . Smoking status: Never Smoker  . Smokeless tobacco: Never Used  Substance Use Topics  . Alcohol use: No  . Drug use: No   Allergies Sulfa antibiotics  Review of Systems Review of Systems All other systems are reviewed and are negative for acute change except as noted in the HPI  Physical Exam Vital Signs  I have reviewed the triage vital signs BP 112/62 (BP Location: Left Arm)   Pulse 87   Temp 98.4 F (36.9 C) (Oral)   Resp 18   Ht 5\' 5"  (1.651 m)   Wt 63.5 kg (140 lb)   SpO2 95%   BMI 23.30 kg/m   Physical Exam  Constitutional: She  is oriented to person, place, and time. She appears well-developed and well-nourished. No distress.  HENT:  Head: Normocephalic. Head is with contusion.    Right Ear: External ear normal.  Left Ear: External ear normal.  Nose: Nose normal.  No malocclusion, midface instability.  Eyes: Conjunctivae and EOM are normal. Pupils are equal, round, and reactive to light. Right eye exhibits no discharge. Left eye exhibits no discharge. No scleral icterus.  Neck: Normal range of motion. Neck supple.  Cardiovascular: Normal rate, regular rhythm and normal heart sounds. Exam reveals no gallop and no friction rub.  No murmur heard. Pulses:      Radial pulses are 2+ on the right side, and 2+ on the left side.       Dorsalis pedis pulses are 2+ on the right side, and 2+ on the left side.  Pulmonary/Chest: Effort normal and breath sounds normal. No stridor. No respiratory distress. She has no wheezes. She has no rales.  Abdominal: Soft. She exhibits no distension. There is no tenderness.  Musculoskeletal: She exhibits no edema or tenderness.       Cervical back: She exhibits no bony tenderness.       Thoracic back: She exhibits no bony tenderness.       Lumbar back: She exhibits no bony tenderness.  Clavicles stable. Chest stable to AP/Lat compression. Pelvis stable to Lat compression. No obvious extremity deformity. No chest or abdominal wall contusion.  Neurological: She is alert and oriented to person, place, and time.  Mental Status:  Alert and oriented to person, place, and time.  Attention and concentration normal.  Speech clear.    Cranial Nerves:  II Visual Fields: Intact to confrontation. Visual fields intact. III, IV, VI: Pupils equal and reactive to light and near. Full eye movement without nystagmus  V Facial Sensation: Normal. No weakness of masticatory muscles  VII: No facial weakness or asymmetry  VIII Auditory Acuity: Grossly normal  IX/X: The uvula is midline; the palate  elevates symmetrically  XI: Normal sternocleidomastoid and trapezius strength  XII: The tongue is midline. No atrophy or fasciculations.   Motor System: Muscle Strength: 5/5 and symmetric in the upper and lower extremities.  No pronation or drift.  Muscle Tone: Tone and muscle bulk are normal in the upper and lower extremities.   Reflexes: DTRs: 1+ and symmetrical in all four extremities. No Clonus Coordination: Intact finger-to-nose.. No tremor.  Sensation: Intact to light touch, and pinprick.  Gait: Routine gait antalgic   Skin: Skin is warm and dry. No rash noted. She is not diaphoretic. No erythema.  Psychiatric: She has a normal mood and affect.  Vitals reviewed.   ED Results and Treatments Labs (all labs ordered are listed, but only abnormal results are displayed) Labs Reviewed - No data to display                                                                                                                       EKG  EKG Interpretation  Date/Time:    Ventricular Rate:    PR Interval:    QRS Duration:   QT Interval:    QTC Calculation:   R Axis:     Text Interpretation:        Radiology Ct Head Wo Contrast  Result Date: 09/05/2017 CLINICAL DATA:  71 year old female with fall and head injury today. Dizziness. EXAM: CT HEAD WITHOUT CONTRAST TECHNIQUE: Contiguous axial images were obtained from the base of the skull through the vertex without intravenous contrast. COMPARISON:  06/07/2014 CT FINDINGS: Brain: No evidence of acute infarction, hemorrhage, hydrocephalus, extra-axial collection or mass lesion/mass effect. Vascular: No hyperdense vessel or unexpected calcification. Skull: Normal. Negative for fracture or focal lesion. Sinuses/Orbits: No acute finding. Other: None. IMPRESSION: Unremarkable noncontrast head CT. Electronically Signed   By: Harmon PierJeffrey  Hu M.D.   On: 09/05/2017 14:35   Pertinent labs & imaging results that were available during my care of the patient were  reviewed by me and considered in my medical decision making (see chart for details).  Medications Ordered in ED Medications - No data to display                                                                                                                                  Procedures Procedures  (including critical care time)  Medical Decision Making / ED Course I have reviewed the nursing notes for this encounter and the patient's prior records (if available in EHR or on provided paperwork).    CT head unremarkable.  No evidence of other significant trauma requiring imaging.  Patient was able to  ambulate without significant complication.  The patient is safe for discharge with strict return precautions.   Final Clinical Impression(s) / ED Diagnoses Final diagnoses:  Fall, initial encounter  Contusion of face, initial encounter    Disposition: Discharge  Condition: Good  I have discussed the results, Dx and Tx plan with the patient and son who expressed understanding and agree(s) with the plan. Discharge instructions discussed at great length. The patient and son were given strict return precautions who verbalized understanding of the instructions. No further questions at time of discharge.    ED Discharge Orders    None       Follow Up: Shaune PollackGates, Donna, MD [address] [place] [id number] [phone]  Schedule an appointment as soon as possible for a visit  As needed     This chart was dictated using voice recognition software.  Despite best efforts to proofread,  errors can occur which can change the documentation meaning.   Nira Connardama, Darrick Greenlaw Eduardo, MD 09/05/17 [phone]

## 2017-09-05 NOTE — ED Notes (Signed)
RN went over CT information with son and talked about education regarding dementia and following up with primary care. RN assisted pt to car via wheelchair

## 2017-11-25 ENCOUNTER — Other Ambulatory Visit: Payer: Self-pay | Admitting: Family Medicine

## 2017-11-25 ENCOUNTER — Ambulatory Visit
Admission: RE | Admit: 2017-11-25 | Discharge: 2017-11-25 | Disposition: A | Discharge status: Home / Self Care | Payer: Medicare Other | Source: Ambulatory Visit | Attending: Family Medicine | Admitting: Family Medicine

## 2017-11-25 DIAGNOSIS — R059 Cough, unspecified: Secondary | ICD-10-CM

## 2017-11-25 DIAGNOSIS — R062 Wheezing: Secondary | ICD-10-CM

## 2017-11-25 DIAGNOSIS — R05 Cough: Secondary | ICD-10-CM

## 2017-12-11 ENCOUNTER — Telehealth: Payer: Self-pay | Admitting: *Deleted

## 2017-12-11 NOTE — Telephone Encounter (Signed)
Patients spouse came by today requesting a call from MD to discuss concerns he has.  Per Belenda CruiseKristin, RN, request I called patient back to inform him that since patient is not an established patient here and there is no DPR we are unable to speak to him.  The person that answered states he is not in.

## 2017-12-11 NOTE — Telephone Encounter (Signed)
Spouse has lots of concerns about patient and wants to speak to the MD prior to her March visit

## 2018-01-07 ENCOUNTER — Encounter: Payer: Self-pay | Admitting: Neurology

## 2018-01-07 ENCOUNTER — Ambulatory Visit: Payer: Medicare Other | Admitting: Neurology

## 2018-01-07 ENCOUNTER — Ambulatory Visit (INDEPENDENT_AMBULATORY_CARE_PROVIDER_SITE_OTHER): Payer: Medicare Other | Admitting: Neurology

## 2018-01-07 ENCOUNTER — Telehealth: Payer: Self-pay | Admitting: Neurology

## 2018-01-07 VITALS — BP 120/73 | HR 79 | Wt 149.0 lb

## 2018-01-07 DIAGNOSIS — G20 Parkinson's disease: Secondary | ICD-10-CM

## 2018-01-07 DIAGNOSIS — R413 Other amnesia: Secondary | ICD-10-CM

## 2018-01-07 DIAGNOSIS — G20C Parkinsonism, unspecified: Secondary | ICD-10-CM

## 2018-01-07 DIAGNOSIS — W19XXXS Unspecified fall, sequela: Secondary | ICD-10-CM

## 2018-01-07 DIAGNOSIS — G20A1 Parkinson's disease without dyskinesia, without mention of fluctuations: Secondary | ICD-10-CM

## 2018-01-07 MED ORDER — CARBIDOPA-LEVODOPA 25-100 MG PO TABS: ORAL_TABLET | ORAL | 5 refills | Status: DC

## 2018-01-07 NOTE — Patient Instructions (Addendum)
I think you have signs and symptoms of parkinson's like disease, called parkinsonism.   I do want to suggest a few things today:  Remember to drink plenty of fluid at least 6 glasses (8 oz each), eat healthy meals and do not skip any meals. Try to eat protein with a every meal and eat a healthy snack such as fruit or nuts in between meals. Try to keep a regular sleep-wake schedule and try to exercise daily, particularly in the form of walking, 20-30 minutes a day, if you can.   You may need to start using a walker, we will consider physical therapy soon. You are at fall risk. Please look into getting a call alert button, like Life Alert, as you have fallen and could not get up.   Try to stay active physically and mentally. Engage in social activities in your community and with your family and try to keep up with current events by reading the newspaper or watching the news. Try to do word puzzles and you may like to do puzzles and brain games on the computer such as on [email or web address].   As far as your medications are concerned, I would like to suggest Sinemet (generic name: carbidopa-levodopa) 25/100 mg: Take half a pill twice daily (8 AM and noon) for one week, then half a pill 3 times a day (8 AM, noon, and 4 PM) for one week, then one pill 3 times a day thereafter. Please try to take the medication away from you mealtimes, that is, ideally either one hour before or 2 hours after your meal to ensure optimal absorption. The medication can interfere with the protein content of your meal and trying to the protein in your food and therefore not get fully absorbed.  Common side effects reported are: Nausea, vomiting, sedation, confusion, lightheadedness. Rare side effects include hallucinations, severe nausea or vomiting, diarrhea and significant drop in blood pressure especially when going from lying to standing or from sitting to standing.   As far as diagnostic testing, I will order: We will do a brain  scan, called MRI and call you with the test results. We will have to schedule you for this on a separate date. This test requires authorization from your insurance, and we will take care of the insurance process.  We will request a formal neuropsychological test (aka cognitive testing) for your memory complaints. This requires a referral to a trained and licensed neuropsychologist and will be a separate appointment at a different clinic.   I would like to see you back in 3 months, sooner if we need to. Please call us with any interim questions, concerns, problems, updates or refill requests.  Our phone number is [phone]. We also have an after hours call service for urgent matters and there is a physician on-call for urgent questions, that cannot wait till the next work day. For any emergencies you know to call 911 or go to the nearest emergency room.   You can email me through my chart and also leave a phone message for Baxter HireKristen, my nurse.

## 2018-01-07 NOTE — Telephone Encounter (Signed)
01/07/18 Medicare/bcbs supp order sent to [facility]  °

## 2018-01-07 NOTE — Progress Notes (Signed)
Subjective:    Patient ID: Becky Figueroa is a 72 y.o. female.  HPI     Huston Foley, MD, PhD [facility] [address], [address] [address] [place], [place] [address]  Dear Baxter Hire,   I saw your patient, Becky Figueroa, upon your kind request in my neurologic clinic today for initial consultation of her memory loss and tremor. The patient is accompanied by her youngest son, Ander Purpura, today. As you know, Becky Figueroa is a 72 year old right-handed woman with an underlying medical history of anxiety, hyperlipidemia, reflux disease, constipation, HLP, recent UTI and borderline overweight state, who reports memory loss for the past few months. Her son reports that she had trouble sleeping, she would get days and nights confused, she was wandering, she had loss of appetite. She had no telltale hallucinations however. She was talking in her sleep and has done so for some time. She has had some falls in the recent past, typically because she gets her feet tangled up in the blanket. She likes to stay warm with a blanket and sometimes walks with it. He reports that though she has a very good bed she does not sleep in her bed. She sleeps in the then in her recliner watching TV and falling asleep while watching TV. She lives with her second husband of 26 years. All her 4 children are from her first husband, whom she divorced. She used to work in [employer], broadcasting/film/video. She is a nonsmoker and does not utilize alcohol and does not utilize caffeine and any regularity. She has 2 sons and 2 daughters, she has grandchildren and great-grandchildren. Her youngest son lives with them. He moved from [place] to help take care of her. She has had constipation for years which was severe at times. I reviewed your office note from 11/25/2017. She had blood work at the time including TSH, chemistry and urinalysis. Of note, she had a head CT without contrast on 09/05/2017 after a fall and I reviewed the  results: IMPRESSION: Unremarkable noncontrast head CT.  She had a previous head CT without contrast on 06/08/2014 for altered mental status and I reviewed the results: IMPRESSION: No acute intracranial abnormalities. The appearance of the brain is normal. Her memory and sleep issues have improved since she was treated for her UTI recently. She has blood work pending for April. On 11/25/1998 913 her MMSE was 18 out of 30 in your office. She and her son report that her confusion, sleep and appetite are better. She has had a left hand tremor for some months, maybe a couple of years. She reports a family history of dementia on her father's side including paternal grandmother also paternal great aunt. She also reports a family history of Parkinson's disease in her paternal grandmother.  Her Past Medical History Is Significant For: Past Medical History:  Diagnosis Date  . Anal stenosis   . Anxiety   . Depression   . Fatty liver   . GERD (gastroesophageal reflux disease)   . Hiatal hernia   . History of anal fissures   . History of chronic constipation   . History of kidney stones    1973  . Hyperlipidemia   . Left ureteral calculus   . Wears glasses     Her Past Surgical History Is Significant For: Past Surgical History:  Procedure Laterality Date  . ANAL FISSURECTOMY  1994   and Hemorrhoidectomy  . BOTOX INJECTION N/A 11/18/2013   Procedure: BOTOX INJECTION;  Surgeon: Verlee Monte  Arne Cleveland, MD;  Location: [facility];  Service: Endoscopy;  Laterality: N/A;  . CHOLECYSTECTOMY OPEN  1982  . CYSTOSCOPY WITH RETROGRADE PYELOGRAM, URETEROSCOPY AND STENT PLACEMENT Left 06/06/2016   Procedure: CYSTOSCOPY WITH RETROGRADE PYELOGRAM, URETEROSCOPY AND STENT PLACEMENT;  Surgeon: Barron Alvine, MD;  Location: [facility];  Service: Urology;  Laterality: Left;  . FLEXIBLE SIGMOIDOSCOPY N/A 11/18/2013   Procedure: FLEXIBLE SIGMOIDOSCOPY/ with BOTOX;  Surgeon: Hart Carwin, MD;  Location: [facility];  Service: Endoscopy;  Laterality: N/A;  . HOLMIUM LASER APPLICATION Left 06/06/2016   Procedure: HOLMIUM LASER APPLICATION;  Surgeon: Barron Alvine, MD;  Location: [facility];  Service: Urology;  Laterality: Left;  . TONSILLECTOMY  age 84  . TOTAL ABDOMINAL HYSTERECTOMY W/ BILATERAL SALPINGOOPHORECTOMY  1984   and Appendectomy    Her Family History Is Significant For: Family History  Problem Relation Age of Onset  . Breast cancer Maternal Aunt        x 4  . Pancreatic cancer Maternal Uncle 56  . Diabetes Brother   . Colon cancer Neg Hx     Her Social History Is Significant For: Social History   Socioeconomic History  . Marital status: Married    Spouse name: Not on file  . Number of children: 4  . Years of education: Not on file  . Highest education level: Not on file  Social Needs  . Financial resource strain: Not on file  . Food insecurity - worry: Not on file  . Food insecurity - inability: Not on file  . Transportation needs - medical: Not on file  . Transportation needs - non-medical: Not on file  Occupational History  . Occupation: nurse    Comment: retired  Tobacco Use  . Smoking status: Never Smoker  . Smokeless tobacco: Never Used  Substance and Sexual Activity  . Alcohol use: No  . Drug use: No  . Sexual activity: Not on file  Other Topics Concern  . Not on file  Social History Narrative  . Not on file    Her Allergies Are:  Allergies  Allergen Reactions  . Sulfa Antibiotics Other (See Comments)    "real flushed and hot"  :   Her Current Medications Are:  Outpatient Encounter Medications as of 01/07/2018  Medication Sig  . acetaminophen (TYLENOL) 325 MG tablet Take 650 mg every 6 (six) hours as needed by mouth for headache.  . ALPRAZolam (XANAX) 0.5 MG tablet Take 0.5 mg by mouth 3 (three) times daily as needed for anxiety.  . CRESTOR 5 MG tablet Take 5 mg by mouth every morning.   . lansoprazole (PREVACID) 30 MG capsule  Take 30 mg by mouth every morning.   . Multiple Vitamins-Minerals (ONE-A-DAY WOMENS 50 PLUS PO) Take 1 tablet by mouth daily.  . polyethylene glycol (MIRALAX / GLYCOLAX) packet Take 17 g daily by mouth.  . traMADol (ULTRAM) 50 MG tablet Take 50 mg by mouth every 6 (six) hours as needed.   No facility-administered encounter medications on file as of 01/07/2018.   :   Review of Systems:  Out of a complete 14 point review of systems, all are reviewed and negative with the exception of these symptoms as listed below: Review of Systems  Neurological:       Patient is here with her son to discuss her tremors. He says that he noticed her tremors started about a year ago and have gotten slightly worse. She has a family history  of tremors.     Objective:  Neurological Exam  Physical Exam Physical Examination:   Vitals:   01/07/18 1001  BP: 120/73  Pulse: 79    General Examination: The patient is a very pleasant 72 y.o. female in no acute distress. She appears somewhat frail and deconditioned, she is well groomed.  HEENT: Normocephalic, atraumatic, pupils are equal, round and reactive to light and accommodation. She has mild difficulty with tracking. She has corrective eyeglasses. She has minimal facial masking and an intermittent lower jaw tremor. She has mild hypophonia. She has mild nuchal rigidity. She has no obvious sialorrhea. Airway examination reveals full dentures on top and missing teeth on the bottom. Which she has partials. No dysarthria is noted. Tongue protrudes centrally and palate elevates symmetrically.    Chest: Clear to auscultation without wheezing, rhonchi or crackles noted.  Heart: S1+S2+0, regular and normal without murmurs, rubs or gallops noted.   Abdomen: Soft, non-tender and non-distended with normal bowel sounds appreciated on auscultation.  Extremities: There is no pitting edema in the distal lower extremities bilaterally. Pedal pulses are intact.  Skin: Warm  and dry without trophic changes noted.  Musculoskeletal: exam reveals no obvious joint deformities, tenderness or joint swelling or erythema.   Neurologically:  Mental status: The patient is awake, alert and oriented in all 4 spheres. Her immediate and remote memory, attention, language skills and fund of knowledge are mildly impaired. There is no evidence of aphasia, agnosia, apraxia or anomia. Speech is clear with normal prosody and enunciation. Thought process is linear but she appears to have some slowness in thinking. Mood is normal, affect is normal. Cranial nerves II - XII are as described above under HEENT exam. In addition: Left shoulder is a little lower than right. She has a stooped posture.  Motor exam:  Thin bulk, global strength of 4+ out of 5, fairly normal tone is noted throughout. She has a fairly consistent left hand and forearm resting tremor, she has no other resting tremor. She has no significant postural or action tremor. She has one handwriting a slightly smaller handwriting but it is legible. Slight tremulousness is noted with her handwriting. On Archimedes spiral drawing she has some hesitancy with the right and significant difficulty with the left, not so much in keeping with trembling while tracing. On fine motor testing she has minimal difficulty on the right and moderate difficulty on the left in the upper and lower extremities with finger taps, hand movements, rapid alternating patting and foot taps. She stands with mild difficulty, posture is stooped for age. She walks with decreased arm swing bilaterally, left more noticeable than right and has some difficulty turning. While turning, she does stagger backwards for 2 steps and can self-correct. Her balance is mildly impaired.  Cerebellar testing: No dysmetria or intention tremor. There is no truncal or gait ataxia.  Sensory exam: intact to light touch.   Assessment and Plan:    In summary, Becky Figueroa is a very  pleasant 72 y.o.-year old female with an underlying medical history of anxiety, hyperlipidemia, reflux disease, constipation, HLP, recent UTI and borderline overweight state, who  presents for neurologic consultation of her tremors and memory dysfunction. She was recently treated with antibiotics for UTI after which confusion and appetite improved. Her sleep schedule improved as well, nevertheless, she does not have the best sleep habits. She has on examination some signs of parkinsonism with left-sided lateralization concerning for Parkinson's disease. We will continue to monitor. I  suggested a few things today: I would like to proceed with a brain MRI without contrast, we will not give contrast because of recent UTI. I would like to refer her for formal cognitive testing to a neuropsychologist. I would like to initiate symptomatic treatment with low-dose Sinemet generic starting at 25-100 milligrams strength half a pill twice a day with gradual increase. We talked about expectations, side effects and benefits of the medication at length today. She is advised that her balance is affected, possibly her constipation could be connected to her underlying diagnosis as well. She is advised to stay well-hydrated, exercise on a regular basis and consider using a walker. She has fallen in the past and was not able to get up and her husband was not able to help her at the time as I understand. Per son, she was on the floor for an hour. They are encouraged to look into getting a call alert button. I would like to see her back in 3 months, sooner if needed. We will keep her posted as to her MRI results in the interim. They are encouraged to call with any questions or concerns that may arise.  I answered all their questions today and the patient and her son were in agreement.  Thank you very much for allowing me to participate in the care of this nice patient. If I can be of any further assistance to you please do not  hesitate to call me at [phone].  Sincerely,   Huston Foley, MD, PhD

## 2018-01-16 ENCOUNTER — Telehealth: Payer: Self-pay | Admitting: Neurology

## 2018-01-16 MED ORDER — ONDANSETRON HCL 4 MG PO TABS: 4.0000 mg | ORAL_TABLET | Freq: Three times a day (TID) | ORAL | 3 refills | Status: AC | PRN

## 2018-01-16 NOTE — Telephone Encounter (Signed)
Please advise pt or son I have placed an order for Zofran generic for nausea, sent to her pharm. This is for as needed use only.

## 2018-01-16 NOTE — Telephone Encounter (Signed)
I called patient's son back to let him know that Rx has been sent to pharmacy but he did not answer and VM was full.

## 2018-01-16 NOTE — Telephone Encounter (Signed)
Pt son(on DPR) has called to ask that Dr Frances FurbishAthar go ahead and call in the nausea medication because pt needs it. Please use  [id number]- [place], [place] - [address] [phone] (Phone) [phone] (Fax)

## 2018-01-16 NOTE — Addendum Note (Signed)
Addended by: Huston FoleyATHAR, Tiah Heckel on: 01/16/2018 04:44 PM   Modules accepted: Orders

## 2018-01-20 ENCOUNTER — Telehealth: Payer: Self-pay

## 2018-01-20 ENCOUNTER — Ambulatory Visit
Admission: RE | Admit: 2018-01-20 | Discharge: 2018-01-20 | Disposition: A | Discharge status: Home / Self Care | Payer: Medicare Other | Source: Ambulatory Visit | Attending: Neurology | Admitting: Neurology

## 2018-01-20 DIAGNOSIS — W19XXXS Unspecified fall, sequela: Secondary | ICD-10-CM

## 2018-01-20 DIAGNOSIS — G20 Parkinson's disease: Secondary | ICD-10-CM

## 2018-01-20 DIAGNOSIS — R110 Nausea: Secondary | ICD-10-CM | POA: Insufficient documentation

## 2018-01-20 DIAGNOSIS — R413 Other amnesia: Secondary | ICD-10-CM

## 2018-01-20 NOTE — Progress Notes (Signed)
Please advise patient that her recent brain MRI showed no acute findings. She has no evidence of stroke or significant atrophy or brain volume loss. There are some changes that are chronic, in the realm of scarring from possible prior head injury. Again, this is not an acute finding. Appearance and structure of the brain overall normal for age which is reassuring. No further action req. Huston FoleySaima Kyuss Hale, MD, PhD [facility])

## 2018-01-20 NOTE — Telephone Encounter (Signed)
PA completed for zofran, [facility]. Should have a determination in 3-5 business days.

## 2018-01-20 NOTE — Telephone Encounter (Signed)
Steward DroneBrenda with [employer] called wanting to inform the office the PA has been approved

## 2018-01-20 NOTE — Telephone Encounter (Signed)
Informed [facility] of this approval.

## 2018-01-21 ENCOUNTER — Telehealth: Payer: Self-pay | Admitting: Neurology

## 2018-01-21 ENCOUNTER — Telehealth: Payer: Self-pay

## 2018-01-21 NOTE — Telephone Encounter (Signed)
I called pt's home number and pt's son's cell number, per DPR. Neither phone has VM set up. Will try calling again later.

## 2018-01-21 NOTE — Telephone Encounter (Signed)
I spoke to our check in staff, it apparently was pt's husband [name] that presented to the office this morning inquiring about medicine changes, and the pt was not present.  I called pt, had an extended conversation with pt and her son, [name], on [place]. I advised both pt and son of pt's MRI results. Pt does report a fall where she hit her head, fairly recently, but denies any other head trauma. I advised them that Dr. Frances FurbishAthar was able to prescribe zofran to help with pt's nausea. Pt has not picked up the zofran yet. Pt reports that the sinemet was making her feel nauseated, and was vomiting from this. The sinemet did help her tremors, however. I encouraged pt and son to try the zofran when pt feels nauseated and to keep taking the sinemet. Pt and son report that pt decided to stop taking the sinemet yesterday because of the side effects. Pt's son reports that he was finally able to "get her to gain some weight" and is concerned about the s/e from sinemet making her feel worse, even though it did help her tremors. I advised pt and son that I will speak to Dr. Frances FurbishAthar in this regard.  I also advised pt and her son that pt's husband has presented to the office twice to inquire about pt's medical information. They verified that pt's husband [name], should not have access to her medical information.

## 2018-01-21 NOTE — Telephone Encounter (Signed)
Okay to keep her off the Sinemet for now. We may restart the Sinemet at a low dose in the near future but for now, please ask patient and her son to keep us updated, see if her nausea and appetite stabilize in the next couple of weeks.

## 2018-01-21 NOTE — Telephone Encounter (Signed)
-----   Message from Saima Athar, MD sent at 01/20/2018  5:22 PM EDT ----- PlHuston Foleyease advise patient that her recent brain MRI showed no acute findings. She has no evidence of stroke or significant atrophy or brain volume loss. There are some changes that are chronic, in the realm of scarring from possible prior head injury. Again, this is not an acute finding. Appearance and structure of the brain overall normal for age which is reassuring. No further action req. Huston FoleySaima Athar, MD, PhD [facility]

## 2018-01-21 NOTE — Telephone Encounter (Signed)
Pt states CARBIDOPA-LEVODOPA 25-100 TAB is making her ill and would like to know if there is something else she can take in the place of this medication. Please call [phone]

## 2018-01-21 NOTE — Telephone Encounter (Signed)
See other telephone note from today 

## 2018-01-22 ENCOUNTER — Other Ambulatory Visit: Payer: Self-pay

## 2018-01-22 ENCOUNTER — Emergency Department (HOSPITAL_COMMUNITY): Payer: Medicare Other

## 2018-01-22 ENCOUNTER — Encounter (HOSPITAL_COMMUNITY): Payer: Self-pay

## 2018-01-22 ENCOUNTER — Emergency Department (HOSPITAL_COMMUNITY)
Admission: EM | Admit: 2018-01-22 | Discharge: 2018-01-22 | Disposition: A | Discharge status: Home / Self Care | Payer: Medicare Other | Attending: Emergency Medicine | Admitting: Emergency Medicine

## 2018-01-22 DIAGNOSIS — Z79899 Other long term (current) drug therapy: Secondary | ICD-10-CM | POA: Diagnosis not present

## 2018-01-22 DIAGNOSIS — R404 Transient alteration of awareness: Secondary | ICD-10-CM | POA: Diagnosis not present

## 2018-01-22 DIAGNOSIS — R410 Disorientation, unspecified: Secondary | ICD-10-CM | POA: Diagnosis not present

## 2018-01-22 LAB — I-STAT CG4 LACTIC ACID, ED
LACTIC ACID, VENOUS: 0.73 mmol/L (ref 0.5–1.9)
Lactic Acid, Venous: 0.94 mmol/L (ref 0.5–1.9)

## 2018-01-22 LAB — URINALYSIS, ROUTINE W REFLEX MICROSCOPIC
BACTERIA UA: NONE SEEN
Bilirubin Urine: NEGATIVE
Glucose, UA: NEGATIVE mg/dL
Ketones, ur: NEGATIVE mg/dL
Nitrite: NEGATIVE
PH: 7 (ref 5.0–8.0)
Protein, ur: NEGATIVE mg/dL
Specific Gravity, Urine: 1.005 (ref 1.005–1.030)

## 2018-01-22 LAB — COMPREHENSIVE METABOLIC PANEL
ALBUMIN: 4 g/dL (ref 3.5–5.0)
ALT: 17 U/L (ref 14–54)
ANION GAP: 13 (ref 5–15)
AST: 25 U/L (ref 15–41)
Alkaline Phosphatase: 113 U/L (ref 38–126)
BUN: 7 mg/dL (ref 6–20)
CHLORIDE: 101 mmol/L (ref 101–111)
CO2: 25 mmol/L (ref 22–32)
Calcium: 9.2 mg/dL (ref 8.9–10.3)
Creatinine, Ser: 0.7 mg/dL (ref 0.44–1.00)
GFR calc Af Amer: >60 mL/min (ref >60)
GFR calc non Af Amer: >60 mL/min (ref >60)
GLUCOSE: 116 mg/dL — AB (ref 65–99)
POTASSIUM: 4.1 mmol/L (ref 3.5–5.1)
SODIUM: 139 mmol/L (ref 135–145)
Total Bilirubin: 0.9 mg/dL (ref 0.3–1.2)
Total Protein: 7.4 g/dL (ref 6.5–8.1)

## 2018-01-22 LAB — CBC
HCT: 41 % (ref 36.0–46.0)
Hemoglobin: 13.6 g/dL (ref 12.0–15.0)
MCH: 31.2 pg (ref 26.0–34.0)
MCHC: 33.2 g/dL (ref 30.0–36.0)
MCV: 94 fL (ref 78.0–100.0)
PLATELETS: 98 10*3/uL — AB (ref 150–400)
RBC: 4.36 MIL/uL (ref 3.87–5.11)
RDW: 13.2 % (ref 11.5–15.5)
WBC: 5.3 10*3/uL (ref 4.0–10.5)

## 2018-01-22 LAB — PROTIME-INR
INR: 1.08
PROTHROMBIN TIME: 13.9 s (ref 11.4–15.2)

## 2018-01-22 LAB — I-STAT CHEM 8, ED
BUN: 8 mg/dL (ref 6–20)
CHLORIDE: 101 mmol/L (ref 101–111)
Calcium, Ion: 1.14 mmol/L — ABNORMAL LOW (ref 1.15–1.40)
Creatinine, Ser: 0.7 mg/dL (ref 0.44–1.00)
Glucose, Bld: 113 mg/dL — ABNORMAL HIGH (ref 65–99)
HEMATOCRIT: 41 % (ref 36.0–46.0)
Hemoglobin: 13.9 g/dL (ref 12.0–15.0)
Potassium: 4.2 mmol/L (ref 3.5–5.1)
SODIUM: 140 mmol/L (ref 135–145)
TCO2: 29 mmol/L (ref 22–32)

## 2018-01-22 LAB — I-STAT TROPONIN, ED: Troponin i, poc: 0 ng/mL (ref 0.00–0.08)

## 2018-01-22 LAB — DIFFERENTIAL
Basophils Absolute: 0 10*3/uL (ref 0.0–0.1)
Basophils Relative: 0 %
EOS ABS: 0 10*3/uL (ref 0.0–0.7)
EOS PCT: 1 %
Lymphocytes Relative: 26 %
Lymphs Abs: 1.4 10*3/uL (ref 0.7–4.0)
Monocytes Absolute: 0.3 10*3/uL (ref 0.1–1.0)
Monocytes Relative: 6 %
NEUTROS PCT: 67 %
Neutro Abs: 3.6 10*3/uL (ref 1.7–7.7)

## 2018-01-22 LAB — APTT: aPTT: 35 seconds (ref 24–36)

## 2018-01-22 MED ORDER — IOPAMIDOL (ISOVUE-370) INJECTION 76%
INTRAVENOUS | Status: AC
  Administered 2018-01-22: 90 mL
  Filled 2018-01-22: qty 50

## 2018-01-22 MED ORDER — TRAZODONE HCL 50 MG PO TABS: ORAL_TABLET | ORAL | 0 refills | Status: DC

## 2018-01-22 MED ORDER — SODIUM CHLORIDE 0.9 % IV BOLUS
500.0000 mL | Freq: Once | INTRAVENOUS | Status: AC
  Administered 2018-01-22: 500 mL via INTRAVENOUS

## 2018-01-22 NOTE — ED Notes (Addendum)
Pt ambulated in hall to bulk room and back to room. Pt did well. Pt did not complain of any pain or dizziness. Pt husband stated that her walking is normal.

## 2018-01-22 NOTE — ED Notes (Signed)
Notified CT pt is ready for scan 

## 2018-01-22 NOTE — ED Provider Notes (Addendum)
Patient is being evaluated today for confusion.  I was asked by Dr. Adela LankFloyd to evaluate her after her ambulation trial.  The patient was able to walk apparently per her norm.  Brief review of history indicates that she has had confusion for several days, and today was instructed to stop taking Sinemet.  She was more confused so she was directed to the ED for evaluation.  Since here she has been evaluated by the neuro hospitalist who recommended limiting Xanax dosing during the daytime, and restarting Sinemet 25/100, 1/2 tablet 3 times a day.  He indicated that she could be changed to the 10/100 tablet if she has persistent problem with nausea.  Family updated on findings and plan at this time.  There aware of the plan, and are comfortable with it.  They plan on restarting her Sinemet and her assistance at home.  All questions answered to the best my ability.  Discharge paperwork done.   Mancel BaleWentz, Freedom Lopezperez, MD 01/22/18 1724   After initial discharge, the patient's family members requested a prescription for trazodone to help her sleep at night.  They reportedly discussed this with Dr. Adela LankFloyd earlier.  She will be given a limited prescription of trazodone 50 mg #4 to take 1/2 tablet at bedtime to assist with sleep.   Mancel BaleWentz, Antrone Walla, MD 01/22/18 [phone]

## 2018-01-22 NOTE — ED Notes (Signed)
Placed purewick on pt. 

## 2018-01-22 NOTE — Telephone Encounter (Addendum)
Pt's son, Cleon GustinDarren Scott, per [place], returned my call. I explained Dr. Teofilo PodAthar's recommendations regarding pt's sinemet. Pt's son reports that pt has been very confused this morning, telling pt's son that he needed to call "him" back, but cannot tell the son who "him" is, and keeps asking about "watering." This is not her normal self, pt was reportedly able to carry on a coherent conversation up until this morning. Pt's son asked pt if she would like to speak with me, and she said yes. Pt asked me, "Can I drink?" I encouraged her to eat as usual and drink plenty of water. Pt asked me again if she could drink, and said "what is wrong with me?" I explained again Dr. Teofilo PodAthar's sinemet recommendations and encouraged her to eat normally and drink plenty of water. Pt then said, "so drinking is ok?"  I asked to speak with pt's son. Pt was coherent yesterday when I spoke with her and pt's son confirms this. I am concerned about pt's sudden confusion and mental status change. Pt's son reports that pt "cannot get her words out" this morning either. I encouraged son to take pt to be evaluated emergently today for this sudden change. I advised him that ER is the best option for this and I explained that sometimes a stroke could cause sudden confusion and trouble getting words out, and even if it is not a stroke, pt could have a UTI or some other underlying problem that could be causing this acute confusion. Pt's son agrees with me that pt needs to be seen today but will try getting pt an appt at her PCP office.  I spoke with Dr. Frances FurbishAthar, she agrees with me that pt should be seen emergently in the ER, and pt's son agrees and [facility].

## 2018-01-22 NOTE — Consult Note (Addendum)
Neurology Consultation Reason for Consult: Worsening speech, confusion Referring Physician: Dr Adela Lank  HPI: Becky Figueroa is a 72 y.o. female  With Parkinson's disease recently started on Sinemet ( but discontinued due to side effect of nausea)  gradual memory decline, anxiety. She recently had an MRI brain which was no stroke, just chronic subdural hygromas. She received a call from [facility] and at the time was very confused and not answering questions. Son states he was asked to bring her to ED. She has gradually improved, and speech is mainly slow. , CTA and CTP negative was performed by ED due to concern for stroke. Neurology was consulted for further recommendations.   ROS: A 14 point ROS was performed and is negative except as noted in the HPI.   Past Medical History:  Diagnosis Date  . Anal stenosis   . Anxiety   . Depression   . Fatty liver   . GERD (gastroesophageal reflux disease)   . Hiatal hernia   . History of anal fissures   . History of chronic constipation   . History of kidney stones    1973  . Hyperlipidemia   . Left ureteral calculus   . Wears glasses      Family History  Problem Relation Age of Onset  . Breast cancer Maternal Aunt        x 4  . Pancreatic cancer Maternal Uncle 56  . Diabetes Brother   . Colon cancer Neg Hx     Social History:  reports that she has never smoked. She has never used smokeless tobacco. She reports that she does not drink alcohol or use drugs.   Exam: Current vital signs: BP 129/69   Pulse 87   Temp 98.7 F (37.1 C) (Oral)   Resp 19   Ht 5\' 5"  (1.651 m)   Wt 67.1 kg (148 lb)   SpO2 95%   BMI 24.63 kg/m  Vital signs in last 24 hours:     Physical Exam  Constitutional: Appears well-developed and well-nourished.  Psych: Affect appropriate to situation Eyes: No scleral injection HENT: No OP obstrucion Head: Normocephalic.  Cardiovascular: Normal rate and regular rhythm.  Respiratory: Effort normal, non-labored  breathing GI: Soft.  No distension. There is no tenderness.  Skin: WDI  Neuro: Mental Status: Patient is awake, alert, oriented to person but not oriented to place, situation Patient is not able to give a clear and coherent history. Slow speech  Cranial Nerve II: Visual Fields are full. Pupils are equal, round, and reactive to light.   III,IV, VI: EOMI without ptosis or diploplia.  V: Facial sensation is symmetric to temperature VII: Facial movement is symmetric.  VIII: hearing is intact to voice X: Uvula elevates symmetrically XI: Shoulder shrug is symmetric. XII: tongue is midline without atrophy or fasciculations.  Motor: Coghwheel rigidity. Bulk is normal. 4+/5 strength was present in all four extremities.  Sensory: Sensation is symmetric to light touch and temperature in the arms and legs. Deep Tendon Reflexes: 1+ and symmetric in the biceps and patellae.  Plantars: Toes are downgoing bilaterally.  Cerebellar: FNF and HKS are intact bilaterally with mild tremors      I have reviewed labs in epic and the results pertinent to this consultation are: within normal limits  I have reviewed the images obtained: Ct head, CTA head and neck, CTP    ASSESSMENT AND PLAN  Parkinson's disease + dementia  No focal deficits, low suspicion this is a stroke, CTA  and CTP negative also favors this not being a stroke However, offered MRI brain to rule out stroke to be 100% sure,  but family declined   Recommendations  IV fluids Check for infection Limit Xanax during the day time Resume Sinemet 25/100 at 1/2 tab dose three times a day, if still has nausea the switch to 10/100 carbidopa levodopa dosing   Follow up with Outpatient neurology

## 2018-01-22 NOTE — Telephone Encounter (Signed)
Nothing further needed. Thank you for explaining this so thoroughly to patient and son.

## 2018-01-22 NOTE — ED Provider Notes (Signed)
[facility] EMERGENCY DEPARTMENT Provider Note   CSN: [id number] Arrival date & time: 01/22/18  1118     History   Chief Complaint Chief Complaint  Patient presents with  . Altered Mental Status    HPI Becky Figueroa is a 72 y.o. female.  72 yo F with a cc of ams since wakening.  Lives with her son, normal when went to sleep but woke and has having trouble speaking, and generally weak.  Has symptoms like this previously due to UTI.  Recent MRI negative for stroke. No infectious symptoms.   The patient was recently on medications for Parkinson's but was taken off due to intolerance.  She had had some shaking that was concerning to the family and neurology started her on this presumptively.  She recently had an MRI that was negative for Parkinson's disease.  She was then taken off the medication.  This was about 4 days ago.  The history is provided by the patient and a relative.  Altered Mental Status   This is a new problem. The current episode started 3 to 5 hours ago. The problem has not changed since onset.Associated symptoms include confusion.    Past Medical History:  Diagnosis Date  . Anal stenosis   . Anxiety   . Depression   . Fatty liver   . GERD (gastroesophageal reflux disease)   . Hiatal hernia   . History of anal fissures   . History of chronic constipation   . History of kidney stones    1973  . Hyperlipidemia   . Left ureteral calculus   . Wears glasses     Patient Active Problem List   Diagnosis Date Noted  . Nausea 01/20/2018  . Diarrhea 11/26/2013  . Fecal incontinence 11/26/2013  . 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 Anal fissure 03/09/2013  . Unspecified constipation 03/09/2013  . ANXIETY 08/01/2010  . GERD 08/01/2010  . DYSPEPSIA 08/01/2010  . IRRITABLE BOWEL SYNDROME 08/01/2010    Past Surgical History:  Procedure Laterality Date  . ANAL FISSURECTOMY  1994   and Hemorrhoidectomy  . BOTOX INJECTION N/A 11/18/2013   Procedure: BOTOX INJECTION;  Surgeon: Hart Carwin, MD;  Location: [facility];  Service: Endoscopy;  Laterality: N/A;  . CHOLECYSTECTOMY OPEN  1982  . CYSTOSCOPY WITH RETROGRADE PYELOGRAM, URETEROSCOPY AND STENT PLACEMENT Left 06/06/2016   Procedure: CYSTOSCOPY WITH RETROGRADE PYELOGRAM, URETEROSCOPY AND STENT PLACEMENT;  Surgeon: Barron Alvine, MD;  Location: [facility] [place];  Service: Urology;  Laterality: Left;  . FLEXIBLE SIGMOIDOSCOPY N/A 11/18/2013   Procedure: FLEXIBLE SIGMOIDOSCOPY/ with BOTOX;  Surgeon: Hart Carwin, MD;  Location: [facility];  Service: Endoscopy;  Laterality: N/A;  . HOLMIUM LASER APPLICATION Left 06/06/2016   Procedure: HOLMIUM LASER APPLICATION;  Surgeon: Barron Alvine, MD;  Location: [facility];  Service: Urology;  Laterality: Left;  . TONSILLECTOMY  age 63  . TOTAL ABDOMINAL HYSTERECTOMY W/ BILATERAL SALPINGOOPHORECTOMY  1984   and Appendectomy     OB History   None      Home Medications    Prior to Admission medications   Medication Sig Start Date End Date Taking? Authorizing Provider  acetaminophen (TYLENOL) 325 MG tablet Take 650 mg every 6 (six) hours as needed by mouth for headache.   Yes [provider]  ALPRAZolam Prudy Feeler) 0.5 MG tablet Take 0.5 mg by mouth 3 (three) times daily as needed for anxiety.    [provider]  carbidopa-levodopa (SINEMET IR)  25-100 MG tablet Take 1/2 pill twice daily x 1 week, then 1/2 pill 3 times a day x 1 week, then 1 pill 3 times a day thereafter. 01/07/18   Huston Foley, MD  CRESTOR 5 MG tablet Take 5 mg by mouth every morning.  05/29/13   [provider]  lansoprazole (PREVACID) 30 MG capsule Take 30 mg by mouth every morning.     [provider]  Multiple Vitamins-Minerals (ONE-A-DAY WOMENS 50 PLUS PO) Take 1 tablet by mouth daily.    [provider]  ondansetron (ZOFRAN) 4 MG tablet Take 1 tablet (4 mg total) by mouth every 8 (eight) hours as needed  for nausea or vomiting. 01/16/18   Huston Foley, MD  polyethylene glycol (MIRALAX / GLYCOLAX) packet Take 17 g daily by mouth.    [provider]  traMADol (ULTRAM) 50 MG tablet Take 50 mg by mouth every 6 (six) hours as needed.    [provider]  traZODone (DESYREL) 50 MG tablet Take 1/2 tablet at bedtime to aid sleep 01/22/18   Mancel Bale, MD    Family History Family History  Problem Relation Age of Onset  . Breast cancer Maternal Aunt        x 4  . Pancreatic cancer Maternal Uncle 56  . Diabetes Brother   . Colon cancer Neg Hx     Social History Social History   Tobacco Use  . Smoking status: Never Smoker  . Smokeless tobacco: Never Used  Substance Use Topics  . Alcohol use: No  . Drug use: No     Allergies   Sulfa antibiotics   Review of Systems Review of Systems  Constitutional: Negative for chills and fever.  HENT: Negative for congestion and rhinorrhea.   Eyes: Negative for redness and visual disturbance.  Respiratory: Negative for shortness of breath and wheezing.   Cardiovascular: Negative for chest pain and palpitations.  Gastrointestinal: Negative for nausea and vomiting.  Genitourinary: Negative for dysuria and urgency.  Musculoskeletal: Negative for arthralgias and myalgias.  Skin: Negative for pallor and wound.  Neurological: Negative for dizziness and headaches.  Psychiatric/Behavioral: Positive for confusion.     Physical Exam Updated Vital Signs BP 129/69   Pulse 87   Temp 98.7 F (37.1 C) (Oral)   Resp 19   Ht 5\' 5"  (1.651 m)   Wt 67.1 kg (148 lb)   SpO2 95%   BMI 24.63 kg/m   Physical Exam  Constitutional: She is oriented to person, place, and time. She appears well-developed and well-nourished. No distress.  HENT:  Head: Normocephalic and atraumatic.  Eyes: Pupils are equal, round, and reactive to light. EOM are normal.  Neck: Normal range of motion. Neck supple.  Cardiovascular: Normal rate and regular  rhythm. Exam reveals no gallop and no friction rub.  No murmur heard. Pulmonary/Chest: Effort normal. She has no wheezes. She has no rales.  Abdominal: Soft. She exhibits no distension. There is no tenderness.  Musculoskeletal: She exhibits no edema or tenderness.  Neurological: She is alert and oriented to person, place, and time.  LUE drift. LLE drift.  Very mildly weak L compared to right.   Skin: Skin is warm and dry. She is not diaphoretic.  Psychiatric: She has a normal mood and affect. Her behavior is normal.  Nursing note and vitals reviewed.    ED Treatments / Results  Labs (all labs ordered are listed, but only abnormal results are displayed) Labs Reviewed  CBC - Abnormal;  Notable for the following components:      Result Value   Platelets 98 (*)    All other components within normal limits  COMPREHENSIVE METABOLIC PANEL - Abnormal; Notable for the following components:   Glucose, Bld 116 (*)    All other components within normal limits  URINALYSIS, ROUTINE W REFLEX MICROSCOPIC - Abnormal; Notable for the following components:   Hgb urine dipstick SMALL (*)    Leukocytes, UA LARGE (*)    Squamous Epithelial / LPF 0-5 (*)    All other components within normal limits  I-STAT CHEM 8, ED - Abnormal; Notable for the following components:   Glucose, Bld 113 (*)    Calcium, Ion 1.14 (*)    All other components within normal limits  PROTIME-INR  APTT  DIFFERENTIAL  I-STAT TROPONIN, ED  CBG MONITORING, ED  I-STAT CG4 LACTIC ACID, ED  I-STAT CG4 LACTIC ACID, ED    EKG EKG Interpretation  Date/Time:  Wednesday January 22 2018 11:25:24 EDT Ventricular Rate:  84 PR Interval:  128 QRS Duration: 84 QT Interval:  368 QTC Calculation: 434 R Axis:   -11 Text Interpretation:  Normal sinus rhythm Possible Anterior infarct , age undetermined Abnormal ECG No significant change since last tracing Confirmed by Melene Plan [phone]) on 01/22/2018 11:47:04 AM   Radiology Ct Angio  Head W Or Wo Contrast  Result Date: 01/22/2018 CLINICAL DATA:  Patient woke confused and unable to answer questions. EXAM: CT ANGIOGRAPHY HEAD AND NECK CT PERFUSION BRAIN TECHNIQUE: Multidetector CT imaging of the head and neck was performed using the standard protocol during bolus administration of intravenous contrast. Multiplanar CT image reconstructions and MIPs were obtained to evaluate the vascular anatomy. Carotid stenosis measurements (when applicable) are obtained utilizing NASCET criteria, using the distal internal carotid diameter as the denominator. Multiphase CT imaging of the brain was performed following IV bolus contrast injection. Subsequent parametric perfusion maps were calculated using RAPID software. CONTRAST:  90mL ISOVUE-370 IOPAMIDOL (ISOVUE-370) INJECTION 76% COMPARISON:  Brain MRI from 2 days prior FINDINGS: CT HEAD FINDINGS CT HEAD Brain: There is dural thickening/trace hygroma with crowded appearance of sulci for age, diffuse and known from recent brain MRI. On previous brain MRI and on today's scan there is midbrain buckling and Ponto mamillary narrowing suggesting brain sag and intracranial hypotension. No visible infarct or hemorrhage. No hydrocephalus or masslike finding. Vascular: Negative Skull: No acute or aggressive finding. Hyperostosis interna Sinuses: Negative Orbits: Negative ASPECTS (Alberta Stroke Program Early CT Score) Not scored with this history. CTA NECK FINDINGS Aortic arch: Atherosclerotic plaque. No acute finding or dilatation. Four vessel branching pattern. Right carotid system: Mild atherosclerotic plaque at the common carotid bifurcation and ICA bulb. No stenosis or ulceration. Left carotid system: Atheromatous plaque mainly at the common carotid bifurcation. No flow limiting stenosis or ulceration. Vertebral arteries: The left vertebral artery arises from the arch. Moderate atheromatous narrowing at the left vertebral origin. The right vertebral artery is  diffusely patent. Skeleton: No acute or aggressive finding Other neck: No acute finding. Multiple tiny thyroid nodules considered incidental. Soft tissue density at the right parotid tail is facial vein. Upper chest: Nonspecific bilateral reticulation with some traction bronchiectasis in the left upper lobe. Partially visualized scoliosis. Left cervical rib that is fused to the first full rib. Review of the MIP images confirms the above findings CTA HEAD FINDINGS Anterior circulation: Aplastic right A1 segment with fetal type right PCA. Atherosclerotic calcification on the carotid siphons that is mild. No  emergent large vessel occlusion or proximal flow limiting stenosis. Negative for aneurysm. Posterior circulation: Codominant vertebral arteries with mild V4 segment plaque. The vertebral and basilar arteries are smooth and diffusely patent. Good flow in the bilateral posterior cerebral arteries. Venous sinuses: Patent Anatomic variants: As above Delayed phase: No focal abnormality. Review of the MIP images confirms the above findings CT Brain Perfusion Findings: CBF (<30%) Volume: 0mL Perfusion (Tmax>6.0s) volume: 0mL IMPRESSION: 1. No emergent large vessel occlusion.  No infarct by CT perfusion. 2. Dural thickening or small hygromas diffusely as seen on outpatient brain MRI 2 days prior. Accompanied brain sag appearance suggests underlying intracranial hypotension. 3. Moderate narrowing of the left vertebral artery origin which arises directly from the aortic arch. 4. Interstitial lung disease with fibrotic changes. Recommend nonemergent pulmonary referral and high-resolution CT follow-up if this is previously unknown. Electronically Signed   By: Marnee Spring M.D.   On: 01/22/2018 15:09   Dg Chest 2 View  Result Date: 01/22/2018 CLINICAL DATA:  Mental status change cos possibly related to medication. History of Parkinson's disease, no current chest complaints. EXAM: CHEST - 2 VIEW COMPARISON:  PA and  lateral chest x-ray of November 25, 2017 FINDINGS: The right hemidiaphragm remains higher than the left. There is borderline hypoinflation on the right with better inflation on the left. This is stable. The interstitial markings of both lungs are coarse and more conspicuous today. There is no alveolar infiltrate. There is no pleural effusion. The heart is top-normal in size. The pulmonary vascularity is not engorged. There is calcification in the wall of the aortic arch. The bony thorax exhibits no acute abnormality. IMPRESSION: Increased prominence of the pulmonary interstitium bilaterally which may reflect low-grade interstitial edema or an underlying pulmonary fibrotic process. There is no alveolar pneumonia. Thoracic aortic atherosclerosis. Electronically Signed   By: David  [place] M.D.   On: 01/22/2018 12:27   Ct Angio Neck W Or Wo Contrast  Result Date: 01/22/2018 CLINICAL DATA:  Patient woke confused and unable to answer questions. EXAM: CT ANGIOGRAPHY HEAD AND NECK CT PERFUSION BRAIN TECHNIQUE: Multidetector CT imaging of the head and neck was performed using the standard protocol during bolus administration of intravenous contrast. Multiplanar CT image reconstructions and MIPs were obtained to evaluate the vascular anatomy. Carotid stenosis measurements (when applicable) are obtained utilizing NASCET criteria, using the distal internal carotid diameter as the denominator. Multiphase CT imaging of the brain was performed following IV bolus contrast injection. Subsequent parametric perfusion maps were calculated using RAPID software. CONTRAST:  90mL ISOVUE-370 IOPAMIDOL (ISOVUE-370) INJECTION 76% COMPARISON:  Brain MRI from 2 days prior FINDINGS: CT HEAD FINDINGS CT HEAD Brain: There is dural thickening/trace hygroma with crowded appearance of sulci for age, diffuse and known from recent brain MRI. On previous brain MRI and on today's scan there is midbrain buckling and Ponto mamillary narrowing suggesting  brain sag and intracranial hypotension. No visible infarct or hemorrhage. No hydrocephalus or masslike finding. Vascular: Negative Skull: No acute or aggressive finding. Hyperostosis interna Sinuses: Negative Orbits: Negative ASPECTS (Alberta Stroke Program Early CT Score) Not scored with this history. CTA NECK FINDINGS Aortic arch: Atherosclerotic plaque. No acute finding or dilatation. Four vessel branching pattern. Right carotid system: Mild atherosclerotic plaque at the common carotid bifurcation and ICA bulb. No stenosis or ulceration. Left carotid system: Atheromatous plaque mainly at the common carotid bifurcation. No flow limiting stenosis or ulceration. Vertebral arteries: The left vertebral artery arises from the arch. Moderate atheromatous narrowing at the left  vertebral origin. The right vertebral artery is diffusely patent. Skeleton: No acute or aggressive finding Other neck: No acute finding. Multiple tiny thyroid nodules considered incidental. Soft tissue density at the right parotid tail is facial vein. Upper chest: Nonspecific bilateral reticulation with some traction bronchiectasis in the left upper lobe. Partially visualized scoliosis. Left cervical rib that is fused to the first full rib. Review of the MIP images confirms the above findings CTA HEAD FINDINGS Anterior circulation: Aplastic right A1 segment with fetal type right PCA. Atherosclerotic calcification on the carotid siphons that is mild. No emergent large vessel occlusion or proximal flow limiting stenosis. Negative for aneurysm. Posterior circulation: Codominant vertebral arteries with mild V4 segment plaque. The vertebral and basilar arteries are smooth and diffusely patent. Good flow in the bilateral posterior cerebral arteries. Venous sinuses: Patent Anatomic variants: As above Delayed phase: No focal abnormality. Review of the MIP images confirms the above findings CT Brain Perfusion Findings: CBF (<30%) Volume: 0mL Perfusion  (Tmax>6.0s) volume: 0mL IMPRESSION: 1. No emergent large vessel occlusion.  No infarct by CT perfusion. 2. Dural thickening or small hygromas diffusely as seen on outpatient brain MRI 2 days prior. Accompanied brain sag appearance suggests underlying intracranial hypotension. 3. Moderate narrowing of the left vertebral artery origin which arises directly from the aortic arch. 4. Interstitial lung disease with fibrotic changes. Recommend nonemergent pulmonary referral and high-resolution CT follow-up if this is previously unknown. Electronically Signed   By: Marnee Spring M.D.   On: 01/22/2018 15:09   Ct Cerebral Perfusion W Contrast  Result Date: 01/22/2018 CLINICAL DATA:  Patient woke confused and unable to answer questions. EXAM: CT ANGIOGRAPHY HEAD AND NECK CT PERFUSION BRAIN TECHNIQUE: Multidetector CT imaging of the head and neck was performed using the standard protocol during bolus administration of intravenous contrast. Multiplanar CT image reconstructions and MIPs were obtained to evaluate the vascular anatomy. Carotid stenosis measurements (when applicable) are obtained utilizing NASCET criteria, using the distal internal carotid diameter as the denominator. Multiphase CT imaging of the brain was performed following IV bolus contrast injection. Subsequent parametric perfusion maps were calculated using RAPID software. CONTRAST:  90mL ISOVUE-370 IOPAMIDOL (ISOVUE-370) INJECTION 76% COMPARISON:  Brain MRI from 2 days prior FINDINGS: CT HEAD FINDINGS CT HEAD Brain: There is dural thickening/trace hygroma with crowded appearance of sulci for age, diffuse and known from recent brain MRI. On previous brain MRI and on today's scan there is midbrain buckling and Ponto mamillary narrowing suggesting brain sag and intracranial hypotension. No visible infarct or hemorrhage. No hydrocephalus or masslike finding. Vascular: Negative Skull: No acute or aggressive finding. Hyperostosis interna Sinuses: Negative  Orbits: Negative ASPECTS (Alberta Stroke Program Early CT Score) Not scored with this history. CTA NECK FINDINGS Aortic arch: Atherosclerotic plaque. No acute finding or dilatation. Four vessel branching pattern. Right carotid system: Mild atherosclerotic plaque at the common carotid bifurcation and ICA bulb. No stenosis or ulceration. Left carotid system: Atheromatous plaque mainly at the common carotid bifurcation. No flow limiting stenosis or ulceration. Vertebral arteries: The left vertebral artery arises from the arch. Moderate atheromatous narrowing at the left vertebral origin. The right vertebral artery is diffusely patent. Skeleton: No acute or aggressive finding Other neck: No acute finding. Multiple tiny thyroid nodules considered incidental. Soft tissue density at the right parotid tail is facial vein. Upper chest: Nonspecific bilateral reticulation with some traction bronchiectasis in the left upper lobe. Partially visualized scoliosis. Left cervical rib that is fused to the first full rib. Review of  the MIP images confirms the above findings CTA HEAD FINDINGS Anterior circulation: Aplastic right A1 segment with fetal type right PCA. Atherosclerotic calcification on the carotid siphons that is mild. No emergent large vessel occlusion or proximal flow limiting stenosis. Negative for aneurysm. Posterior circulation: Codominant vertebral arteries with mild V4 segment plaque. The vertebral and basilar arteries are smooth and diffusely patent. Good flow in the bilateral posterior cerebral arteries. Venous sinuses: Patent Anatomic variants: As above Delayed phase: No focal abnormality. Review of the MIP images confirms the above findings CT Brain Perfusion Findings: CBF (<30%) Volume: 0mL Perfusion (Tmax>6.0s) volume: 0mL IMPRESSION: 1. No emergent large vessel occlusion.  No infarct by CT perfusion. 2. Dural thickening or small hygromas diffusely as seen on outpatient brain MRI 2 days prior. Accompanied  brain sag appearance suggests underlying intracranial hypotension. 3. Moderate narrowing of the left vertebral artery origin which arises directly from the aortic arch. 4. Interstitial lung disease with fibrotic changes. Recommend nonemergent pulmonary referral and high-resolution CT follow-up if this is previously unknown. Electronically Signed   By: Marnee Spring M.D.   On: 01/22/2018 15:09    Procedures Procedures (including critical care time) Procedure note: Ultrasound Guided Peripheral IV Ultrasound guided peripheral 1.88 inch angiocath IV placement performed by me. Indications: Nursing unable to place IV. Details: The antecubital fossa and upper arm were evaluated with a multifrequency linear probe. Patent brachial veins were noted. 1 attempt was made to cannulate a vein under realtime US guidance with successful cannulation of the vein and catheter placement. There is return of non-pulsatile dark red blood. The patient tolerated the procedure well without complications. Images archived electronically.  CPT codes: 40981 and [id number]  Medications Ordered in ED Medications  iopamidol (ISOVUE-370) 76 % injection (90 mLs  Contrast Given 01/22/18 1338)  sodium chloride 0.9 % bolus 500 mL (0 mLs Intravenous Stopped 01/22/18 1744)     Initial Impression / Assessment and Plan / ED Course  I have reviewed the triage vital signs and the nursing notes.  Pertinent labs & imaging results that were available during my care of the patient were reviewed by me and considered in my medical decision making (see chart for details).     72 yo F with a chief complaint of difficulty with word finding this was noted this morning after waking.  On my initial exam the patient does have some left upper extremity drift the mild with her a aphasia I will obtain a CT perfusion study to evaluate for large vessel occlusion.  CT negative.  Patient with improvement while in the ED.  Case discussed with neuro who  evaluated the patient at bedside.  Recommended reducing xanax and going back on parkinsons medications.  Also recommended sleep aid.  Family is concerned about sacral ulcer that has been in place for some time and has had trouble healing, recommended going to the wound clinic.  Family also interested in home health pt.  Will attempt to order.  Neuro felt patient needed to demonstrate that she could ambulate prior to discharge.  Care turned over to Dr. Effie Shy, please see his note for further details of care.   9:13 AM:  I have discussed the diagnosis/risks/treatment options with the patient and family and believe the pt to be eligible for discharge home to follow-up with PCP, neuro. We also discussed returning to the ED immediately if new or worsening sx occur. We discussed the sx which are most concerning (e.g., sudden worsening pain, fever, inability to  tolerate by mouth) that necessitate immediate return. Medications administered to the patient during their visit and any new prescriptions provided to the patient are listed below.  Medications given during this visit Medications  iopamidol (ISOVUE-370) 76 % injection (90 mLs  Contrast Given 01/22/18 1338)  sodium chloride 0.9 % bolus 500 mL (0 mLs Intravenous Stopped 01/22/18 1744)     The patient appears reasonably screen and/or stabilized for discharge and I doubt any other medical condition or other [facility] requiring further screening, evaluation, or treatment in the ED at this time prior to discharge.    Final Clinical Impressions(s) / ED Diagnoses   Final diagnoses:  Disorientation  Transient alteration of awareness    ED Discharge Orders        Ordered    traZODone (DESYREL) 50 MG tablet     01/22/18 1750       Melene PlanFloyd, Rosalena Mccorry, DO 01/23/18 [phone]

## 2018-01-22 NOTE — Discharge Instructions (Signed)
Follow-up with your neurologist.  Eat and drink well for the next couple days.

## 2018-01-22 NOTE — Telephone Encounter (Addendum)
I called pt. I advised her of Dr. Teofilo PodAthar's recommendations. Pt said "ok" but asked if she should continue the "medicine". I repeated the instructions several times regarding the sinemet, but pt did not seem to understand the instructions, and I asked to speak with pt's son, her caregiver. Pt's son is asleep, and pt asked me to call back later and explain this to him as well. I will call back later this morning.

## 2018-01-22 NOTE — ED Triage Notes (Addendum)
Pt. Woke up with confusion.  Pt.'s sons reports that he received a call from [facility] this morning to inform him that when the office called to speak to the pt. She was unable to answer questions appropriately.  She is oriented to Self only.  She denies any pain or sob. Skin is p/w/d, PT. 's son reports that she started a new Parkinson's medication 3 weeks ago and this past Monday she had a MRI done that did not show any strokes.  She does have  A Hx of having UTI>  Pt. Is unable to follow commands, no arm drift. Speech is clear and on facial droop.

## 2018-01-23 ENCOUNTER — Telehealth (HOSPITAL_COMMUNITY): Payer: Self-pay | Admitting: Emergency Medicine

## 2018-01-23 NOTE — Telephone Encounter (Signed)
Patient had requested home health/PT wound care eval.  I was late in entering during her visit so created this encounter to order.

## 2018-02-11 ENCOUNTER — Encounter (HOSPITAL_BASED_OUTPATIENT_CLINIC_OR_DEPARTMENT_OTHER): Payer: Medicare Other | Attending: Internal Medicine

## 2018-02-11 DIAGNOSIS — L89322 Pressure ulcer of left buttock, stage 2: Secondary | ICD-10-CM | POA: Insufficient documentation

## 2018-02-11 DIAGNOSIS — B369 Superficial mycosis, unspecified: Secondary | ICD-10-CM | POA: Insufficient documentation

## 2018-02-11 DIAGNOSIS — G20 Parkinson's disease: Secondary | ICD-10-CM | POA: Insufficient documentation

## 2018-02-11 DIAGNOSIS — L89312 Pressure ulcer of right buttock, stage 2: Secondary | ICD-10-CM | POA: Diagnosis present

## 2018-02-18 DIAGNOSIS — L89322 Pressure ulcer of left buttock, stage 2: Secondary | ICD-10-CM | POA: Diagnosis not present

## 2018-03-04 ENCOUNTER — Encounter (HOSPITAL_BASED_OUTPATIENT_CLINIC_OR_DEPARTMENT_OTHER): Payer: Medicare Other | Attending: Internal Medicine

## 2018-03-04 DIAGNOSIS — G20 Parkinson's disease: Secondary | ICD-10-CM | POA: Diagnosis not present

## 2018-03-04 DIAGNOSIS — L89312 Pressure ulcer of right buttock, stage 2: Secondary | ICD-10-CM | POA: Diagnosis present

## 2018-04-09 ENCOUNTER — Ambulatory Visit (INDEPENDENT_AMBULATORY_CARE_PROVIDER_SITE_OTHER): Payer: Medicare Other | Admitting: Neurology

## 2018-04-09 ENCOUNTER — Encounter: Payer: Self-pay | Admitting: Neurology

## 2018-04-09 VITALS — BP 121/76 | HR 74 | Ht 64.0 in | Wt 144.0 lb

## 2018-04-09 DIAGNOSIS — G20 Parkinson's disease: Secondary | ICD-10-CM

## 2018-04-09 DIAGNOSIS — R413 Other amnesia: Secondary | ICD-10-CM | POA: Diagnosis not present

## 2018-04-09 DIAGNOSIS — W19XXXS Unspecified fall, sequela: Secondary | ICD-10-CM

## 2018-04-09 DIAGNOSIS — R2689 Other abnormalities of gait and mobility: Secondary | ICD-10-CM

## 2018-04-09 NOTE — Patient Instructions (Signed)
I am sorry that you did not tolerate the generic Sinemet.  You do not just have a tremor, I am afraid: you have a gait disorder and signs of parkinsonism, left more than right. Unfortunately, you did not want to proceed with more detailed memory evaluation including the memory test we do in clinic.  Your balance is not normal and you are at fall risk. You have fallen before.   As discussed, you may benefit from being seen at a more comprehensive movement disorders center such as at [facility], [facility], or [facility]. Please discuss with your primary care physician or PA.

## 2018-04-09 NOTE — Progress Notes (Addendum)
Subjective:    Patient ID: Becky Figueroa is a 72 y.o. female.  HPI     Interim history:   Becky Figueroa is a 72 year old right-handed woman with an underlying medical history of anxiety, hyperlipidemia, reflux disease, constipation, HLP, recent UTI and borderline overweight state, who presents for follow-up consultation of memory loss and tremors, concern for parkinsonism with left-sided lateralization noted. The patient is accompanied by her husband today (she took her son who was with her last time, off the [facility]). I first met her on 01/07/2018 at the request of her primary care physician Asst., at which time she reported memory loss for the past few months but her son reported additional issues with her sleep, tremor, confusion, loss of appetite. She had had some sleep talking. She was noted to have some parkinsonism. I suggested we proceed with further testing in the form of brain MRI, also neuropsychological evaluation. I suggested symptomatic treatment of her parkinsonism with Sinemet.   Her son called in the interim reporting significant nausea from the Sinemet. She stopped it for a little while. She was advised that she could restarted at a lower dose.   She had a brain MRI without contrast on 01/20/2014 and I reviewed the results:   IMPRESSION: This MRI of the brain without contrast shows the following: 1.    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  These could be due to intracranial hypotension, possibly following her head injury. 2.    The brain parenchyma appears normal for age.    Her son reported interim increase in confusion and altered mental status, repeating questions and repeating herself. He was advised to take her to the emergency room for evaluation of altered mental status.   I reviewed emergency room records from 01/22/2018. She had a CT angiogram head and neck on 01/22/2018 and I reviewed the results: IMPRESSION: 1. No emergent large  vessel occlusion.  No infarct by CT perfusion. 2. Dural thickening or small hygromas diffusely as seen on outpatient brain MRI 2 days prior. Accompanied brain sag appearance suggests underlying intracranial hypotension. 3. Moderate narrowing of the left vertebral artery origin which arises directly from the aortic arch. 4. Interstitial lung disease with fibrotic changes. Recommend nonemergent pulmonary referral and high-resolution CT follow-up if this is previously unknown.  She was advised to limit her Xanax. She was advised to restart Sinemet at the low dose of half pill 3 times a day. She was given a short Rx for Trazodone for sleep.   She did not schedule the neuropsychological consultation as recommended by me.  Today, 04/09/2018: She reports that she is not on the Sinemet any longer as she could not tolerate it. Her husband reports that he does not see any issues with her mobility. He does admit that she has fallen before. She last fell about 4 months ago in the bathroom. He reports that aside from a tremor she does not have any fine motor dyscontrol or problems with mobility. He does endorse that sometimes when she sits for prolonged period of time is difficult for her to start moving. He reports that her sister has a benign tremor in her left hand as well. He does not believe she has any cognitive dysfunction or memory loss. He believes that her memory is better than his. Patient does not want to do MMSE today. She reports there is nothing wrong with her memory. He reports that she has had a tremor for 10 years.  The patient's allergies, current medications, family history, past medical history, past social history, past surgical history and problem list were reviewed and updated as appropriate.   Previously:  01/07/2018: (She) reports memory loss for the past few months. Her son reports that she had trouble sleeping, she would get days and nights confused, she was wandering, she had loss  of appetite. She had no telltale hallucinations however. She was talking in her sleep and has done so for some time. She has had some falls in the recent past, typically because she gets her feet tangled up in the blanket. She likes to stay warm with a blanket and sometimes walks with it. He reports that though she has a very good bed she does not sleep in her bed. She sleeps in the then in her recliner watching TV and falling asleep while watching TV. She lives with her second husband of 58 years. All her 4 children are from her first husband, whom she divorced. She used to work in Building surveyor. She is a nonsmoker and does not utilize alcohol and does not utilize caffeine and any regularity. She has 2 sons and 2 daughters, she has grandchildren and great-grandchildren. Her youngest son lives with them. He moved from [place] to help take care of her. She has had constipation for years which was severe at times. I reviewed your office note from 11/25/2017. She had blood work at the time including TSH, chemistry and urinalysis. Of note, she had a head CT without contrast on 09/05/2017 after a fall and I reviewed the results: IMPRESSION: Unremarkable noncontrast head CT.   She had a previous head CT without contrast on 06/08/2014 for altered mental status and I reviewed the results: IMPRESSION: No acute intracranial abnormalities. The appearance of the brain is normal. Her memory and sleep issues have improved since she was treated for her UTI recently. She has blood work pending for April. On 11/25/1998 913 her MMSE was 18 out of 30 in your office. She and her son report that her confusion, sleep and appetite are better. She has had a left hand tremor for some months, maybe a couple of years. She reports a family history of dementia on her father's side including paternal grandmother also paternal great aunt. She also reports a family history of Parkinson's disease in her paternal grandmother.  Her Past Medical  History Is Significant For: Past Medical History:  Diagnosis Date  . Anal stenosis   . Anxiety   . Depression   . Fatty liver   . GERD (gastroesophageal reflux disease)   . Hiatal hernia   . History of anal fissures   . History of chronic constipation   . History of kidney stones    1973  . Hyperlipidemia   . Left ureteral calculus   . Wears glasses     Her Past Surgical History Is Significant For: Past Surgical History:  Procedure Laterality Date  . ANAL FISSURECTOMY  1994   and Hemorrhoidectomy  . BOTOX INJECTION N/A 11/18/2013   Procedure: BOTOX INJECTION;  Surgeon: Lafayette Dragon, MD;  Location: [facility];  Service: Endoscopy;  Laterality: N/A;  . CHOLECYSTECTOMY OPEN  1982  . CYSTOSCOPY WITH RETROGRADE PYELOGRAM, URETEROSCOPY AND STENT PLACEMENT Left 06/06/2016   Procedure: CYSTOSCOPY WITH RETROGRADE PYELOGRAM, URETEROSCOPY AND STENT PLACEMENT;  Surgeon: Rana Snare, MD;  Location: [facility];  Service: Urology;  Laterality: Left;  . FLEXIBLE SIGMOIDOSCOPY N/A 11/18/2013   Procedure: FLEXIBLE SIGMOIDOSCOPY/ with BOTOX;  Surgeon:  Lafayette Dragon, MD;  Location: [facility];  Service: Endoscopy;  Laterality: N/A;  . HOLMIUM LASER APPLICATION Left 0/10/7492   Procedure: HOLMIUM LASER APPLICATION;  Surgeon: Rana Snare, MD;  [facility];  Service: Urology;  Laterality: Left;  . TONSILLECTOMY  age 43  . TOTAL ABDOMINAL HYSTERECTOMY W/ BILATERAL SALPINGOOPHORECTOMY  1984   and Appendectomy    Her Family History Is Significant For: Family History  Problem Relation Age of Onset  . Breast cancer Maternal Aunt        x 4  . Pancreatic cancer Maternal Uncle 56  . Diabetes Brother   . Colon cancer Neg Hx     Her Social History Is Significant For: Social History   Socioeconomic History  . Marital status: Married    Spouse name: Not on file  . Number of children: 4  . Years of education: Not on file  . Highest education level: Not  on file  Occupational History  . 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 Financial resource strain: Not on file  . Food insecurity:    Worry: Not on file    Inability: Not on file  . Transportation needs:    Medical: Not on file    Non-medical: Not on file  Tobacco Use  . Smoking status: Never Smoker  . Smokeless tobacco: Never Used  Substance and Sexual Activity  . Alcohol use: No  . Drug use: No  . Sexual activity: Not on file  Lifestyle  . Physical activity:    Days per week: Not on file    Minutes per session: Not on file  . Stress: Not on file  Relationships  . Social connections:    Talks on phone: Not on file    Gets together: Not on file    Attends religious service: Not on file    Active member of club or organization: Not on file    Attends meetings of clubs or organizations: Not on file    Relationship status: Not on file  Other Topics Concern  . Not on file  Social History Narrative  . Not on file    Her Allergies Are:  Allergies  Allergen Reactions  . Sinemet [Carbidopa W-Levodopa]     "made me sick"  . Sulfa Antibiotics Other (See Comments)    "real flushed and hot"  :   Her Current Medications Are:  Outpatient Encounter Medications as of 04/09/2018  Medication Sig  . acetaminophen (TYLENOL) 325 MG tablet Take 650 mg every 6 (six) hours as needed by mouth for headache.  . ALPRAZolam (XANAX) 0.5 MG tablet Take 0.5 mg by mouth 3 (three) times daily as needed for anxiety.  . CRESTOR 5 MG tablet Take 5 mg by mouth every morning.   . lansoprazole (PREVACID) 30 MG capsule Take 30 mg by mouth every morning.   . Multiple Vitamins-Minerals (ONE-A-DAY WOMENS 50 PLUS PO) Take 1 tablet by mouth daily.  . ondansetron (ZOFRAN) 4 MG tablet Take 1 tablet (4 mg total) by mouth every 8 (eight) hours as needed for nausea or vomiting.  . polyethylene glycol (MIRALAX / GLYCOLAX) packet Take 17 g daily by mouth.  . traMADol (ULTRAM) 50 MG tablet Take 50  mg by mouth every 6 (six) hours as needed.  . [DISCONTINUED] carbidopa-levodopa (SINEMET IR) 25-100 MG tablet Take 1/2 pill twice daily x 1 week, then 1/2 pill 3 times a day x 1 week, then 1  pill 3 times a day thereafter.  . [DISCONTINUED] traZODone (DESYREL) 50 MG tablet Take 1/2 tablet at bedtime to aid sleep   No facility-administered encounter medications on file as of 04/09/2018.   :  Review of Systems:  Out of a complete 14 point review of systems, all are reviewed and negative with the exception of these symptoms as listed below: Review of Systems  Neurological:       Pt presents for follow up today. She denies any memory problems and can't recall why the neuro-psych appt was not completed. Pt refused a memory test. Pt reports that the sinemet made her very sick, so she stopped taking the medicine. Pt reports that she does not want her son, Jackelyn Hoehn, involved in her care any longer.    Objective:  Neurological Exam  Physical Exam Physical Examination:   Vitals:   04/09/18 1116  BP: 121/76  Pulse: 74    General Examination: The patient is a very pleasant 72 y.o. female in no acute distress. She appears frail and deconditioned, adequately groomed. Minimally verbal.   HEENT: Normocephalic, atraumatic, pupils are equal, round and reactive to light and accommodation. She has mild difficulty with tracking. She has corrective eyeglasses. She has minimal to mild facial masking and an intermittent lower jaw tremor. She has mild hypophonia. She has mild to moderate nuchal rigidity. She has no obvious sialorrhea. Airway examination reveals full dentures on top and missing teeth on the bottom. No dysarthria is noted. Tongue protrudes centrally and palate elevates symmetrically.    Chest: Clear to auscultation without wheezing, rhonchi or crackles noted.  Heart: S1+S2+0, regular and normal without murmurs, rubs or gallops noted.   Abdomen: Soft, non-tender and non-distended with  normal bowel sounds appreciated on auscultation.  Extremities: There is no pitting edema in the distal lower extremities bilaterally. Pedal pulses are intact.  Skin: Warm and dry without trophic changes noted.  Musculoskeletal: exam reveals no obvious joint deformities, tenderness or joint swelling or erythema.   Neurologically:  Mental status: The patient is awake, alert and oriented in all 4 spheres. Her immediate and remote memory, attention, language skills and fund of knowledge are mildly impaired. She refused the MMSE.  Cranial nerves II - XII are as described above under HEENT exam. In addition: Left shoulder is a little lower than right. She has a stooped posture.  Motor exam: Thin bulk, global strength of 4+ out of 5, fairly normal tone is noted throughout, but cogwheeling LUE. She has a fairly consistent left hand and forearm resting tremor, slight resting tremor RUE. She has no significant postural or action tremor.   (01/07/18: She has one handwriting a slightly smaller handwriting but it is legible. Slight tremulousness is noted with her handwriting. On Archimedes spiral drawing she has some hesitancy with the right and significant difficulty with the left, not so much in keeping with trembling while tracing.)   On fine motor testing she has mild difficulty on the right and moderate difficulty on the left in the upper and lower extremities with finger taps, hand movements, rapid alternating patting and foot taps. She stands with mild difficulty, posture is stooped for age. She walks with decreased arm swing bilaterally, left more noticeable than right and has some difficulty turning. While turning, she does stagger some and can self-correct. Her balance is mildly impaired.  Cerebellar testing: No dysmetria or intention tremor. There is no truncal or gait ataxia. Sensory exam: intact to light touch.   Assessment  and Plan:    In summary, CHAUNTE HORNBECK is a very pleasant  72 year old female with an underlying medical history of anxiety, hyperlipidemia, reflux disease, constipation, HLP, recent UTI and borderline overweight state, who presents for follow up consultation of her tremors and memory dysfunction. She reports no difficulty with her memory at this point. She feels that her tremor is her only problem. Her husband endorses that he does not believe she has any cognitive issues and if anything she functions better than he does, he reports. Her son had several concerns last time but she has taken him off of her DPR at this point. Her husband reports that her son is trying to get her to be declared incompetent. Nevertheless, I do believe she does have a gait disorder and is at risk for falls, and she has fallen in the past. She has memory dysfunction but does not wish to proceed with any further evaluation and declined the MMSE today and does not wish to proceed with neuropsychological evaluation either. She had side effects with Sinemet including nausea and mental status changes. Her examination continues to show signs of parkinsonism with left-sided lateralization concerning for Parkinson's disease. She may have associated memory loss. Unfortunately, she is not in agreement and her husband feels, that she does well. He works from approximately 7 AM to 2:30 PM daily. Unfortunately, I am not able to further help her at this point. She is amenable to seeking neurologic consultation and further care with another neurology Center. She is amenable to seeing a movement disorder specialist at the comprehensive neurological center such as [facility], or [facility]. She will discuss this with her primary care physician or PA. She has an appointment coming up soon. We discussed her MRI findings today. Of note, her son had reported, that she had fallen in the past and was not able to get up and her husband was not able to help her at the time. Per son, she was on the floor for an hour.   The patient will follow-up with her primary care physician or PA.  I answered all their questions today and the patient and her husband were in agreement. I spent 20 minutes in total face-to-face time with the patient, more than 50% of which was spent in counseling and coordination of care, reviewing test results, reviewing medication and discussing or reviewing the diagnosis of Parkinsonism, gait disorder, balance issues, memory loss, its prognosis and treatment options. Pertinent laboratory and imaging test results that were available during this visit with the patient were reviewed by me and considered in my medical decision making (see chart for details).

## 2018-04-21 ENCOUNTER — Other Ambulatory Visit: Payer: Self-pay | Admitting: Family Medicine

## 2018-04-21 DIAGNOSIS — G20 Parkinson's disease: Secondary | ICD-10-CM | POA: Diagnosis present

## 2018-04-21 DIAGNOSIS — Z1231 Encounter for screening mammogram for malignant neoplasm of breast: Secondary | ICD-10-CM

## 2018-05-15 ENCOUNTER — Ambulatory Visit
Admission: RE | Admit: 2018-05-15 | Discharge: 2018-05-15 | Disposition: A | Discharge status: Home / Self Care | Payer: Medicare Other | Source: Ambulatory Visit | Attending: Family Medicine | Admitting: Family Medicine

## 2018-05-15 DIAGNOSIS — Z1231 Encounter for screening mammogram for malignant neoplasm of breast: Secondary | ICD-10-CM

## 2018-08-08 ENCOUNTER — Other Ambulatory Visit: Payer: Self-pay

## 2018-08-08 ENCOUNTER — Encounter (HOSPITAL_COMMUNITY): Payer: Self-pay | Admitting: *Deleted

## 2018-08-08 ENCOUNTER — Emergency Department (HOSPITAL_COMMUNITY): Payer: Medicare Other

## 2018-08-08 ENCOUNTER — Emergency Department (HOSPITAL_COMMUNITY)
Admission: EM | Admit: 2018-08-08 | Discharge: 2018-08-09 | Disposition: A | Discharge status: Home / Self Care | Payer: Medicare Other | Attending: Emergency Medicine | Admitting: Emergency Medicine

## 2018-08-08 DIAGNOSIS — K5909 Other constipation: Secondary | ICD-10-CM | POA: Insufficient documentation

## 2018-08-08 DIAGNOSIS — Z79899 Other long term (current) drug therapy: Secondary | ICD-10-CM | POA: Diagnosis not present

## 2018-08-08 DIAGNOSIS — K5900 Constipation, unspecified: Secondary | ICD-10-CM | POA: Diagnosis present

## 2018-08-08 MED ORDER — FLEET ENEMA 7-19 GM/118ML RE ENEM
1.0000 | ENEMA | Freq: Once | RECTAL | Status: AC
  Administered 2018-08-08: 1 via RECTAL
  Filled 2018-08-08: qty 1

## 2018-08-08 NOTE — ED Triage Notes (Signed)
Pt stated "I haven't had a BM in a week and half.  I called my doctor today and he asked me if I had done everything.  He told me just to come to hospital."

## 2018-08-08 NOTE — ED Provider Notes (Signed)
[facility]-EMERGENCY DEPT Provider Note   CSN: [id number] Arrival date & time: 08/08/18  1844     History   Chief Complaint Chief Complaint  Patient presents with  . Constipation    HPI Becky Figueroa is a 72 y.o. female.  72 year old female presents with 2-week history of constipation.  Patient does take tramadol daily.  States she has been in only able to defecate very small pellets of stool with increased gas.  Patient has no associated abdominal discomfort.  No emesis noted.  No fever chills.  Has tried laxatives without relief.  Called her doctor and was told to come here     Past Medical History:  Diagnosis Date  . Anal stenosis   . Anxiety   . Depression   . Fatty liver   . GERD (gastroesophageal reflux disease)   . Hiatal hernia   . History of anal fissures   . History of chronic constipation   . History of kidney stones    1973  . Hyperlipidemia   . Left ureteral calculus   . Wears glasses     Patient Active Problem List   Diagnosis Date Noted  . Nausea 01/20/2018  . Diarrhea 11/26/2013  . Fecal incontinence 11/26/2013  . 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 Anal fissure 03/09/2013  . Unspecified constipation 03/09/2013  . ANXIETY 08/01/2010  . GERD 08/01/2010  . DYSPEPSIA 08/01/2010  . IRRITABLE BOWEL SYNDROME 08/01/2010    Past Surgical History:  Procedure Laterality Date  . ANAL FISSURECTOMY  1994   and Hemorrhoidectomy  . BOTOX INJECTION N/A 11/18/2013   Procedure: BOTOX INJECTION;  Surgeon: Hart Carwin, MD;  Location: [facility];  Service: Endoscopy;  Laterality: N/A;  . CHOLECYSTECTOMY OPEN  1982  . CYSTOSCOPY WITH RETROGRADE PYELOGRAM, URETEROSCOPY AND STENT PLACEMENT Left 06/06/2016   Procedure: CYSTOSCOPY WITH RETROGRADE PYELOGRAM, URETEROSCOPY AND STENT PLACEMENT;  Surgeon: Barron Alvine, MD;  Location: [facility];  Service: Urology;  Laterality: Left;  . FLEXIBLE SIGMOIDOSCOPY N/A 11/18/2013   Procedure: FLEXIBLE SIGMOIDOSCOPY/ with BOTOX;  Surgeon: Hart Carwin, MD;  Location: [facility];  Service: Endoscopy;  Laterality: N/A;  . HOLMIUM LASER APPLICATION Left 06/06/2016   Procedure: HOLMIUM LASER APPLICATION;  Surgeon: Barron Alvine, MD;  Location: [facility];  Service: Urology;  Laterality: Left;  . TONSILLECTOMY  age 34  . TOTAL ABDOMINAL HYSTERECTOMY W/ BILATERAL SALPINGOOPHORECTOMY  1984   and Appendectomy     OB History   None      Home Medications    Prior to Admission medications   Medication Sig Start Date End Date Taking? Authorizing Provider  acetaminophen (TYLENOL) 325 MG tablet Take 650 mg every 6 (six) hours as needed by mouth for headache.    [provider]  ALPRAZolam Prudy Feeler) 0.5 MG tablet Take 0.5 mg by mouth 3 (three) times daily as needed for anxiety.    [provider]  CRESTOR 5 MG tablet Take 5 mg by mouth every morning.  05/29/13   [provider]  lansoprazole (PREVACID) 30 MG capsule Take 30 mg by mouth every morning.     [provider]  Multiple Vitamins-Minerals (ONE-A-DAY WOMENS 50 PLUS PO) Take 1 tablet by mouth daily.    [provider]  ondansetron (ZOFRAN) 4 MG tablet Take 1 tablet (4 mg total) by mouth every 8 (eight) hours as needed for nausea or vomiting. 01/16/18   Huston Foley, MD  polyethylene glycol (MIRALAX / Ethelene Hal)  packet Take 17 g daily by mouth.    [provider]  traMADol (ULTRAM) 50 MG tablet Take 50 mg by mouth every 6 (six) hours as needed.    [provider]    Family History Family History  Problem Relation Age of Onset  . Breast cancer Maternal Aunt        x 4  . Pancreatic cancer Maternal Uncle 56  . Diabetes Brother   . Colon cancer Neg Hx     Social History Social History   Tobacco Use  . Smoking status: Never Smoker  . Smokeless tobacco: Never Used  Substance Use Topics  . Alcohol use: No  . Drug use: No     Allergies     Sinemet [carbidopa w-levodopa] and Sulfa antibiotics   Review of Systems Review of Systems  All other systems reviewed and are negative.    Physical Exam Updated Vital Signs BP 126/65   Pulse 89   Temp 98.5 F (36.9 C) (Oral)   Resp 17   Ht 1.626 m (5\' 4" )   Wt 64.4 kg   SpO2 98%   BMI 24.37 kg/m   Physical Exam  Constitutional: She is oriented to person, place, and time. She appears well-developed and well-nourished.  Non-toxic appearance. No distress.  HENT:  Head: Normocephalic and atraumatic.  Eyes: Pupils are equal, round, and reactive to light. Conjunctivae, EOM and lids are normal.  Neck: Normal range of motion. Neck supple. No tracheal deviation present. No thyroid mass present.  Cardiovascular: Normal rate, regular rhythm and normal heart sounds. Exam reveals no gallop.  No murmur heard. Pulmonary/Chest: Effort normal and breath sounds normal. No stridor. No respiratory distress. She has no decreased breath sounds. She has no wheezes. She has no rhonchi. She has no rales.  Abdominal: Soft. Normal appearance and bowel sounds are normal. She exhibits no distension. There is no tenderness. There is no rebound and no CVA tenderness.  Genitourinary:  Genitourinary Comments: Soft stool noted in rectal vault  Musculoskeletal: Normal range of motion. She exhibits no edema or tenderness.  Neurological: She is alert and oriented to person, place, and time. She has normal strength. No cranial nerve deficit or sensory deficit. GCS eye subscore is 4. GCS verbal subscore is 5. GCS motor subscore is 6.  Skin: Skin is warm and dry. No abrasion and no rash noted.  Psychiatric: She has a normal mood and affect. Her speech is normal and behavior is normal.  Nursing note and vitals reviewed.    ED Treatments / Results  Labs (all labs ordered are listed, but only abnormal results are displayed) Labs Reviewed - No data to display  EKG None  Radiology No results  found.  Procedures Procedures (including critical care time)  Medications Ordered in ED Medications - No data to display   Initial Impression / Assessment and Plan / ED Course  I have reviewed the triage vital signs and the nursing notes.  Pertinent labs & imaging results that were available during my care of the patient were reviewed by me and considered in my medical decision making (see chart for details).     Patient given enema by nursing staff.  Good result and patient stable for discharge  Final Clinical Impressions(s) / ED Diagnoses   Final diagnoses:  None    ED Discharge Orders    None       Lorre Nick, MD 08/08/18 2322

## 2018-09-17 ENCOUNTER — Telehealth: Payer: Self-pay | Admitting: Neurology

## 2018-09-17 NOTE — Telephone Encounter (Signed)
Patient's husband [name] ([place]) requesting a returned call. [name] was not aware son Ebony HailDarren was taken off previous DPR.

## 2018-09-17 NOTE — Telephone Encounter (Signed)
Patient's son Ebony HailDarren (on [place] ) has questions about patient and would like a call back. He would not go into detail.

## 2018-09-17 NOTE — Telephone Encounter (Signed)
I called pt's husband, [name], per DPR, advised him that Dr. Anne HahnWillis and Dr. Frances FurbishAthar are agreeable to the switch. I offered pt an appt with Dr. Anne HahnWillis for 10:30am, check in at 10:00am tomorrow. Pt's husband is agreeable to this appt and verbalized understanding of appt date and time.

## 2018-09-17 NOTE — Telephone Encounter (Signed)
Unfortunately, I cannot return this son's call. Pt signed a new DPR at the last visit only listing her spouse as an acceptable contact. Please see Dr. Teofilo PodAthar's documentation on this matter from the visit notes in June of 2019.

## 2018-09-17 NOTE — Telephone Encounter (Signed)
Okay for the switch. 

## 2018-09-17 NOTE — Telephone Encounter (Signed)
I called Bernie, pt's husband, per DPR. He reports that pt's condition has worsened. Pt is hardly able to care for herself, per pt's husband. Pt is having frequent falls. Pt's memory is worse. He is asking for pt to be evaluated and pt's care taken over by Dr. Anne HahnWillis. Pt's husband and pt's sister see Dr. Anne HahnWillis and they would prefer that pt see Dr. Anne HahnWillis as well. I advised him of our policy that both physicians would need to be agreeable to this transfer of care before we can schedule the pt with Dr. Anne HahnWillis. Pt's husband verbalized understanding.

## 2018-09-17 NOTE — Telephone Encounter (Signed)
Please see last note: we did not schedule further appts with me as she was going to go back to PCP to discuss care elsewhere (such as [facility], [facility], [facility])  Okay with switch to Dr. Anne HahnWillis from my end of things.

## 2018-09-18 ENCOUNTER — Encounter: Payer: Self-pay | Admitting: Neurology

## 2018-09-18 ENCOUNTER — Ambulatory Visit (INDEPENDENT_AMBULATORY_CARE_PROVIDER_SITE_OTHER): Payer: Medicare Other | Admitting: Neurology

## 2018-09-18 DIAGNOSIS — G20 Parkinson's disease: Secondary | ICD-10-CM

## 2018-09-18 DIAGNOSIS — F0280 Dementia in other diseases classified elsewhere without behavioral disturbance: Secondary | ICD-10-CM | POA: Diagnosis not present

## 2018-09-18 DIAGNOSIS — G20A1 Parkinson's disease without dyskinesia, without mention of fluctuations: Secondary | ICD-10-CM | POA: Insufficient documentation

## 2018-09-18 HISTORY — DX: Dementia in other diseases classified elsewhere, unspecified severity, without behavioral disturbance, psychotic disturbance, mood disturbance, and anxiety: F02.80

## 2018-09-18 MED ORDER — CARBIDOPA-LEVODOPA 25-100 MG PO TABS: ORAL_TABLET | ORAL | 3 refills | Status: DC

## 2018-09-18 NOTE — Progress Notes (Signed)
Reason for visit: Parkinson's disease, dementia  Becky Figueroa Older is a 72 y.o. female  History of present illness:  [name] is a 72 year old right-handed white female with a history of a tremor that is been present for several years.  She has a brother and a sister and another family member with tremor as well.  However, the patient has had some progressive changes in her ability to walk over the last year or 2.  The patient has had occasional falls, her activities of daily living have slowed down dramatically, she is now requiring assistance with bathing and dressing.  She does not use a cane or a walker for ambulation.  The patient has also developed a memory problem that has become more acute over the last year, particularly over the last 6 months.  She was seen previously by Dr. Frances FurbishAthar, the patient was placed on Sinemet but she could not tolerate the medication due to nausea and had to stop the drug.  The patient was taking a full tablet 3 times daily.  The patient has not gone on any medication for memory or for Parkinson's disease, these issues have progressed over time.  The patient is still operating motor vehicle on occasion but only going for short distances.  The patient has severe short-term memory problems, she repeats herself frequently.  The patient can no longer keep up with her medications or appointments, she has required assistance over the last 6 months.  The patient has also had some bowel and bladder incontinence over the last 6 months.  The patient does not do the finances, her husband does this.  She denies any numbness, she denies headaches or dizziness.  She comes to this office for an evaluation.  Past Medical History:  Diagnosis Date  . Anal stenosis   . Anxiety   . Depression   . Fatty liver   . GERD (gastroesophageal reflux disease)   . Hiatal hernia   . History of anal fissures   . History of chronic constipation   . History of kidney stones    1973  .  Hyperlipidemia   . Left ureteral calculus   . Wears glasses     Past Surgical History:  Procedure Laterality Date  . ANAL FISSURECTOMY  1994   and Hemorrhoidectomy  . BOTOX INJECTION N/A 11/18/2013   Procedure: BOTOX INJECTION;  Surgeon: Hart Carwinora M Brodie, MD;  Location: [facility];  Service: Endoscopy;  Laterality: N/A;  . CHOLECYSTECTOMY OPEN  1982  . CYSTOSCOPY WITH RETROGRADE PYELOGRAM, URETEROSCOPY AND STENT PLACEMENT Left 06/06/2016   Procedure: CYSTOSCOPY WITH RETROGRADE PYELOGRAM, URETEROSCOPY AND STENT PLACEMENT;  Surgeon: Barron Alvineavid Grapey, MD;  [facility];  Service: Urology;  Laterality: Left;  . FLEXIBLE SIGMOIDOSCOPY N/A 11/18/2013   Procedure: FLEXIBLE SIGMOIDOSCOPY/ with BOTOX;  Surgeon: Hart Carwinora M Brodie, MD;  Location: [facility];  Service: Endoscopy;  Laterality: N/A;  . HOLMIUM LASER APPLICATION Left 06/06/2016   Procedure: HOLMIUM LASER APPLICATION;  Surgeon: Barron Alvineavid Grapey, MD;  [facility]  Service: Urology;  Laterality: Left;  . TONSILLECTOMY  age 72  . TOTAL ABDOMINAL HYSTERECTOMY W/ BILATERAL SALPINGOOPHORECTOMY  1984   and Appendectomy    Family History  Problem Relation Age of Onset  . Breast cancer Maternal Aunt        x 4  . Pancreatic cancer Maternal Uncle 56  . Diabetes Brother   . Colon cancer Neg Hx     Social history:  reports that she has never smoked. She has never used smokeless tobacco. She reports that she does not drink alcohol or use drugs.  Medications:  Prior to Admission medications   Medication Sig Start Date End Date Taking? Authorizing Provider  acetaminophen (TYLENOL) 325 MG tablet Take 650 mg every 6 (six) hours as needed by mouth for headache.   Yes [provider]  ALPRAZolam Prudy Feeler) 0.5 MG tablet Take 0.5 mg by mouth 3 (three) times daily as needed for anxiety.   Yes [provider]  CRESTOR 5 MG tablet Take 5 mg by mouth every morning.  05/29/13  Yes [provider]    lansoprazole (PREVACID) 30 MG capsule Take 30 mg by mouth every morning.    Yes [provider]  meclizine (ANTIVERT) 12.5 MG tablet  09/01/18  Yes [provider]  Multiple Vitamins-Minerals (ONE-A-DAY WOMENS 50 PLUS PO) Take 1 tablet by mouth daily.   Yes [provider]  ondansetron (ZOFRAN) 4 MG tablet Take 1 tablet (4 mg total) by mouth every 8 (eight) hours as needed for nausea or vomiting. 01/16/18  Yes Huston Foley, MD  polyethylene glycol (MIRALAX / GLYCOLAX) packet Take 17 g daily by mouth.   Yes [provider]  traMADol (ULTRAM) 50 MG tablet Take 50 mg by mouth every 6 (six) hours as needed.   Yes [provider]  traZODone (DESYREL) 50 MG tablet Take one pill at bedtime 08/25/18  Yes [provider]  VITAMIN D, CHOLECALCIFEROL, PO Take by mouth.   Yes [provider]  Vitamin D, Ergocalciferol, (DRISDOL) 1.25 MG (50000 UT) CAPS capsule Take 50,000 Units by mouth every 7 (seven) days.   Yes [provider]      Allergies  Allergen Reactions  . Sinemet [Carbidopa W-Levodopa]     "made me sick"  . Sulfa Antibiotics Other (See Comments)    "real flushed and hot"    ROS:  Out of a complete 14 system review of symptoms, the patient complains only of the following symptoms, and all other reviewed systems are negative.  Weight loss Easy bruising Aching muscles Memory loss, confusion, weakness Decreased energy, change in appetite, hallucinations  Blood pressure 114/71, pulse 81, resp. rate 18, height 5\' 4"  (1.626 m), weight 142 lb (64.4 kg).  Physical Exam  General: The patient is alert and cooperative at the time of the examination.  Eyes: Pupils are equal, round, and reactive to light. Discs are flat bilaterally.  Neck: The neck is supple, no carotid bruits are noted.  Respiratory: The respiratory examination is clear.  Cardiovascular: The cardiovascular examination reveals a regular rate and  rhythm, no obvious murmurs or rubs are noted.  Skin: Extremities are without significant edema.  Neurologic Exam  Mental status: The patient is alert and oriented x 2 at the time of the examination (not oriented to date). The Mini-Mental status examination done today shows a total score of 16/30.  The patient is able to name 3 four-legged animals in 1 minute.  Cranial nerves: Facial symmetry is present. There is good sensation of the face to pinprick and soft touch bilaterally. The strength of the facial muscles and the muscles to head turning and shoulder shrug are normal bilaterally. Speech is well enunciated, no aphasia or dysarthria is noted. Extraocular movements are full, with exception some restriction of superior gaze. Visual fields are full. The tongue is midline, and the patient has symmetric elevation of the soft palate. No obvious hearing deficits are  noted.  A jaw tremor is noted.  Motor: The motor testing reveals 5 over 5 strength of all 4 extremities. Good symmetric motor tone is noted throughout.  Sensory: Sensory testing is intact to pinprick, soft touch and vibration sensation on all 4 extremities. No evidence of extinction is noted.  Coordination: Cerebellar testing reveals good finger-nose-finger and heel-to-shin bilaterally, but the patient does have some apraxia with use of the extremities.  Gait and station: The patient is unable to arise from a seated position with arms crossed.  Once up, she is able to ambulate independently but she has decreased arm swing bilaterally, resting tremor seen on the left arm.  The patient has some difficulty with turns, tandem gait is unsteady.  Romberg is negative.  Reflexes: Deep tendon reflexes are symmetric and normal bilaterally. Toes are downgoing bilaterally.   MRI brain 01/20/18:  IMPRESSION: This MRI of the brain without contrast shows the following: 1.    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  These  could be due to intracranial hypotension, possibly following her head injury. 2.    The brain parenchyma appears normal for age.  * MRI scan images were reviewed online. I agree with the written report.    Assessment/Plan:  1.  Parkinson's disease  2.  Dementia  3.  Gait disturbance  The patient has had significant progression of memory and mobility over the last year.  The presence of dementia will make it much more difficult for her to tolerate the medications for Parkinson's disease.  We will start an low-dose with the Sinemet taking the 25/100 mg tablet, 1/2 tablet twice daily for 3 weeks and then go to 1/2 tablet 3 times daily.  The patient will take the medication after she eats.  The patient eventually may be placed on Namenda, the patient has had some trouble maintaining her weight.  She will follow-up in 3 months, the family will contact me if she is having cognitive decompensation on the Sinemet. She is not to operate a motor vehicle.  Becky Palau MD 09/18/2018 10:57 AM  [facility] [address] [place], [place] [address]  Phone [phone] Fax [phone]

## 2018-10-11 ENCOUNTER — Other Ambulatory Visit: Payer: Self-pay

## 2018-10-11 ENCOUNTER — Encounter (HOSPITAL_COMMUNITY): Payer: Self-pay

## 2018-10-11 ENCOUNTER — Emergency Department (HOSPITAL_COMMUNITY): Payer: Medicare Other

## 2018-10-11 ENCOUNTER — Emergency Department (HOSPITAL_COMMUNITY)
Admission: EM | Admit: 2018-10-11 | Discharge: 2018-10-11 | Disposition: A | Discharge status: Home / Self Care | Payer: Medicare Other | Attending: Emergency Medicine | Admitting: Emergency Medicine

## 2018-10-11 DIAGNOSIS — R4182 Altered mental status, unspecified: Secondary | ICD-10-CM | POA: Diagnosis present

## 2018-10-11 DIAGNOSIS — N390 Urinary tract infection, site not specified: Secondary | ICD-10-CM

## 2018-10-11 DIAGNOSIS — F0280 Dementia in other diseases classified elsewhere without behavioral disturbance: Secondary | ICD-10-CM | POA: Insufficient documentation

## 2018-10-11 DIAGNOSIS — G20 Parkinson's disease: Secondary | ICD-10-CM | POA: Insufficient documentation

## 2018-10-11 DIAGNOSIS — R404 Transient alteration of awareness: Secondary | ICD-10-CM | POA: Diagnosis not present

## 2018-10-11 DIAGNOSIS — E876 Hypokalemia: Secondary | ICD-10-CM | POA: Insufficient documentation

## 2018-10-11 DIAGNOSIS — Z79899 Other long term (current) drug therapy: Secondary | ICD-10-CM | POA: Diagnosis not present

## 2018-10-11 LAB — URINALYSIS, ROUTINE W REFLEX MICROSCOPIC
BILIRUBIN URINE: NEGATIVE
Bacteria, UA: NONE SEEN
Glucose, UA: NEGATIVE mg/dL
Ketones, ur: NEGATIVE mg/dL
Nitrite: NEGATIVE
Protein, ur: NEGATIVE mg/dL
Specific Gravity, Urine: 1.01 (ref 1.005–1.030)
pH: 5 (ref 5.0–8.0)

## 2018-10-11 LAB — COMPREHENSIVE METABOLIC PANEL
ALT: 9 U/L (ref 0–44)
ANION GAP: 10 (ref 5–15)
AST: 16 U/L (ref 15–41)
Albumin: 3.7 g/dL (ref 3.5–5.0)
Alkaline Phosphatase: 69 U/L (ref 38–126)
BUN: 9 mg/dL (ref 8–23)
CO2: 29 mmol/L (ref 22–32)
Calcium: 8.8 mg/dL — ABNORMAL LOW (ref 8.9–10.3)
Chloride: 103 mmol/L (ref 98–111)
Creatinine, Ser: 0.69 mg/dL (ref 0.44–1.00)
GFR calc Af Amer: >60 mL/min (ref >60)
GFR calc non Af Amer: >60 mL/min (ref >60)
Glucose, Bld: 112 mg/dL — ABNORMAL HIGH (ref 70–99)
Potassium: 3.2 mmol/L — ABNORMAL LOW (ref 3.5–5.1)
SODIUM: 142 mmol/L (ref 135–145)
Total Bilirubin: 1.2 mg/dL (ref 0.3–1.2)
Total Protein: 6.7 g/dL (ref 6.5–8.1)

## 2018-10-11 LAB — CBC
HCT: 37.1 % (ref 36.0–46.0)
HEMOGLOBIN: 12.2 g/dL (ref 12.0–15.0)
MCH: 31.7 pg (ref 26.0–34.0)
MCHC: 32.9 g/dL (ref 30.0–36.0)
MCV: 96.4 fL (ref 80.0–100.0)
Platelets: 110 10*3/uL — ABNORMAL LOW (ref 150–400)
RBC: 3.85 MIL/uL — ABNORMAL LOW (ref 3.87–5.11)
RDW: 13.6 % (ref 11.5–15.5)
WBC: 5.4 10*3/uL (ref 4.0–10.5)
nRBC: 0 % (ref 0.0–0.2)

## 2018-10-11 MED ORDER — HYDROXYZINE HCL 25 MG PO TABS: 25.0000 mg | ORAL_TABLET | Freq: Four times a day (QID) | ORAL | 0 refills | Status: DC | PRN

## 2018-10-11 MED ORDER — CEPHALEXIN 500 MG PO CAPS: 500.0000 mg | ORAL_CAPSULE | Freq: Four times a day (QID) | ORAL | 0 refills | Status: DC

## 2018-10-11 MED ORDER — POTASSIUM CHLORIDE 20 MEQ/15ML (10%) PO SOLN
40.0000 meq | Freq: Once | ORAL | Status: AC
  Administered 2018-10-11: 40 meq via ORAL
  Filled 2018-10-11: qty 30

## 2018-10-11 MED ORDER — SODIUM CHLORIDE 0.9 % IV BOLUS
1000.0000 mL | Freq: Once | INTRAVENOUS | Status: AC
  Administered 2018-10-11: 1000 mL via INTRAVENOUS

## 2018-10-11 NOTE — Discharge Instructions (Addendum)
Please take Keflex for urinary tract infection.  Take hydroxyzine as needed for nausea, anxiety or to help with sleeping.  Follow up with your doctor for further care.  Return if you have any concerns.

## 2018-10-11 NOTE — ED Notes (Signed)
Patient transported to X-ray 

## 2018-10-11 NOTE — ED Triage Notes (Addendum)
Pts sons states pt hasn't been eating or sleeping. States she has a hx of parkinson's.  States she keeps staying she has to "go pee a lot". Has been taking AZO at home for "burning and stinging" while urinating. +diarrhea. Son states she took 2 Xanax and was still unable to rest

## 2018-10-11 NOTE — ED Provider Notes (Signed)
[facility]-EMERGENCY DEPT Provider Note   CSN: [id number] Arrival date & time: 10/11/18  0416     History   Chief Complaint Chief Complaint  Patient presents with  . Altered Mental Status    HPI Becky Figueroa is a 72 y.o. female.  The history is provided by the patient, a significant other and medical records. No language interpreter was used.  Altered Mental Status       72 year old female with history of dementia, Parkinson's disease, urinary incontinence brought in by family member for evaluation of altered mental status.  History obtained through son who is at bedside.  Son does take care of patients on a regular basis.  For the past 2 days he noticed that she is more confused than usual, she is having to urinate more frequent and also mention that for the past several months she has not had much of an appetite.  Patient also has occasional nonproductive cough.  He did not notice any fever or chills, no nausea vomiting or diarrhea, she does not complain of any pain that she has or trouble breathing or abdominal pain.  No new medication changes.  Family felt that patient has been losing weight for the past few months because she does not want to eat much.  She was recently having her Parkinson medication change several months ago and it was helping.  The son also worried that patient may be dehydrated because she is urinating so much.  Past Medical History:  Diagnosis Date  . Anal stenosis   . Anxiety   . 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 Depression   . Fatty liver   . GERD (gastroesophageal reflux disease)   . Hiatal hernia   . History of anal fissures   . History of chronic constipation   . History of kidney stones    1973  . Hyperlipidemia   . Left ureteral calculus   . Wears glasses     Patient Active Problem List   Diagnosis Date Noted  . 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 Nausea 01/20/2018  .  Diarrhea 11/26/2013  . Fecal incontinence 11/26/2013  . 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 Anal fissure 03/09/2013  . Unspecified constipation 03/09/2013  . ANXIETY 08/01/2010  . GERD 08/01/2010  . DYSPEPSIA 08/01/2010  . IRRITABLE BOWEL SYNDROME 08/01/2010    Past Surgical History:  Procedure Laterality Date  . ANAL FISSURECTOMY  1994   and Hemorrhoidectomy  . BOTOX INJECTION N/A 11/18/2013   Procedure: BOTOX INJECTION;  Surgeon: Hart Carwin, MD;  Location: [facility];  Service: Endoscopy;  Laterality: N/A;  . CHOLECYSTECTOMY OPEN  1982  . CYSTOSCOPY WITH RETROGRADE PYELOGRAM, URETEROSCOPY AND STENT PLACEMENT Left 06/06/2016   Procedure: CYSTOSCOPY WITH RETROGRADE PYELOGRAM, URETEROSCOPY AND STENT PLACEMENT;  Surgeon: Barron Alvine, MD;  Location: [facility];  Service: Urology;  Laterality: Left;  . FLEXIBLE SIGMOIDOSCOPY N/A 11/18/2013   Procedure: FLEXIBLE SIGMOIDOSCOPY/ with BOTOX;  Surgeon: Hart Carwin, MD;  Location: [facility];  Service: Endoscopy;  Laterality: N/A;  . HOLMIUM LASER APPLICATION Left 06/06/2016   Procedure: HOLMIUM LASER APPLICATION;  Surgeon: Barron Alvine, MD;  Location: [facility];  Service: Urology;  Laterality: Left;  . TONSILLECTOMY  age 82  . TOTAL ABDOMINAL HYSTERECTOMY W/ BILATERAL SALPINGOOPHORECTOMY  1984   and Appendectomy     OB History   No obstetric history on file.      Home Medications  Prior to Admission medications   Medication Sig Start Date End Date Taking? Authorizing Provider  acetaminophen (TYLENOL) 325 MG tablet Take 650 mg every 6 (six) hours as needed by mouth for headache.    [provider]  ALPRAZolam Prudy Feeler) 0.5 MG tablet Take 0.5 mg by mouth 3 (three) times daily as needed for anxiety.    [provider]  carbidopa-levodopa (SINEMET IR) 25-100 MG tablet 1/2 tablet twice a day for 3 weeks, then take 1/2 tablet three times a day 09/18/18   York Spaniel, MD  CRESTOR 5 MG  tablet Take 5 mg by mouth every morning.  05/29/13   [provider]  lansoprazole (PREVACID) 30 MG capsule Take 30 mg by mouth every morning.     [provider]  meclizine (ANTIVERT) 12.5 MG tablet  09/01/18   [provider]  Multiple Vitamins-Minerals (ONE-A-DAY WOMENS 50 PLUS PO) Take 1 tablet by mouth daily.    [provider]  ondansetron (ZOFRAN) 4 MG tablet Take 1 tablet (4 mg total) by mouth every 8 (eight) hours as needed for nausea or vomiting. 01/16/18   Huston Foley, MD  polyethylene glycol (MIRALAX / GLYCOLAX) packet Take 17 g daily by mouth.    [provider]  traMADol (ULTRAM) 50 MG tablet Take 50 mg by mouth every 6 (six) hours as needed.    [provider]  traZODone (DESYREL) 50 MG tablet Take one pill at bedtime 08/25/18   [provider]  VITAMIN D, CHOLECALCIFEROL, PO Take by mouth.    [provider]  Vitamin D, Ergocalciferol, (DRISDOL) 1.25 MG (50000 UT) CAPS capsule Take 50,000 Units by mouth every 7 (seven) days.    [provider]    Family History Family History  Problem Relation Age of Onset  . Breast cancer Maternal Aunt        x 4  . Pancreatic cancer Maternal Uncle 56  . Diabetes Brother   . Colon cancer Neg Hx     Social History Social History   Tobacco Use  . Smoking status: Never Smoker  . Smokeless tobacco: Never Used  Substance Use Topics  . Alcohol use: No  . Drug use: No     Allergies   Sinemet [carbidopa w-levodopa] and Sulfa antibiotics   Review of Systems Review of Systems  All other systems reviewed and are negative.    Physical Exam Updated Vital Signs BP 105/62 (BP Location: Left Arm)   Pulse 79   Temp 98.5 F (36.9 C) (Oral)   Resp (!) 28   Ht 5' 4.5" (1.638 m)   Wt 62.6 kg   SpO2 97%   BMI 23.32 kg/m   Physical Exam Vitals signs and nursing note reviewed.  Constitutional:      General: She is not in acute distress.    Appearance:  She is well-developed.  HENT:     Head: Atraumatic.     Mouth/Throat:     Mouth: Mucous membranes are moist.  Eyes:     Conjunctiva/sclera: Conjunctivae normal.     Comments: No nuchal rigidity  Neck:     Musculoskeletal: Neck supple.  Cardiovascular:     Rate and Rhythm: Normal rate and regular rhythm.     Pulses: Normal pulses.  Pulmonary:     Effort: Pulmonary effort is normal.     Breath sounds: No wheezing, rhonchi or rales.  Abdominal:     Palpations: Abdomen is soft.     Tenderness:  There is no abdominal tenderness.  Skin:    Findings: No rash.  Neurological:     Mental Status: She is alert.     GCS: GCS eye subscore is 4. GCS verbal subscore is 5. GCS motor subscore is 6.     Cranial Nerves: Cranial nerves are intact.     Sensory: Sensation is intact.     Comments: Patient is alert and oriented to time place and situation.      ED Treatments / Results  Labs (all labs ordered are listed, but only abnormal results are displayed) Labs Reviewed  COMPREHENSIVE METABOLIC PANEL - Abnormal; Notable for the following components:      Result Value   Potassium 3.2 (*)    Glucose, Bld 112 (*)    Calcium 8.8 (*)    All other components within normal limits  CBC - Abnormal; Notable for the following components:   RBC 3.85 (*)    Platelets 110 (*)    All other components within normal limits  URINALYSIS, ROUTINE W REFLEX MICROSCOPIC - Abnormal; Notable for the following components:   Hgb urine dipstick SMALL (*)    Leukocytes, UA TRACE (*)    All other components within normal limits  URINE CULTURE  CBG MONITORING, ED    EKG None  Radiology Dg Chest Portable 1 View  Result Date: 10/11/2018 CLINICAL DATA:  Altered mental status and weakness. EXAM: PORTABLE CHEST 1 VIEW COMPARISON:  08/08/2018 FINDINGS: Heart size appears normal. No pleural effusion. Pulmonary vascular congestion identified. No airspace consolidation. Visualized osseous structures are unremarkable.  IMPRESSION: 1. Mild pulmonary vascular congestion. 2. No pneumonia identified. Electronically Signed   By: Signa Kellaylor  Stroud M.D.   On: 10/11/2018 07:13    Procedures Procedures (including critical care time)  Medications Ordered in ED Medications  sodium chloride 0.9 % bolus 1,000 mL (0 mLs Intravenous Stopped 10/11/18 0849)  potassium chloride 20 MEQ/15ML (10%) solution 40 mEq (40 mEq Oral Given 10/11/18 0809)     Initial Impression / Assessment and Plan / ED Course  I have reviewed the triage vital signs and the nursing notes.  Pertinent labs & imaging results that were available during my care of the patient were reviewed by me and considered in my medical decision making (see chart for details).     BP (!) 108/54 (BP Location: Left Arm)   Pulse 71   Temp 98.5 F (36.9 C) (Oral)   Resp 16   Ht 5' 4.5" (1.638 m)   Wt 62.6 kg   SpO2 97%   BMI 23.32 kg/m    Final Clinical Impressions(s) / ED Diagnoses   Final diagnoses:  Lower urinary tract infectious disease  Transient alteration of awareness  Hypokalemia    ED Discharge Orders         Ordered    cephALEXin (KEFLEX) 500 MG capsule  4 times daily     10/11/18 0849    hydrOXYzine (ATARAX/VISTARIL) 25 MG tablet  Every 6 hours PRN     10/11/18 0849         6:21 AM This is a patient with history of Parkinson disease and history of dementia who is here with worsening altered mental status and urinary frequency.  Most of history was obtained through her son who is at bedside.  Work-up initiated, will check UA.  Patient also report occasional cough, chest x-ray ordered.  8:50 AM Labs remarkable for mild hypokalemia with a potassium of 3.2, supplementation given in the ED.  Normal WBC, normal H&H, urine with questionable urinary tract infection as evidenced by small amount of hemoglobin and urine dipsticks, trace leukocyte esterase, as well as 6-10 WBC.  Since patient does have urinary symptoms, I discussed with family  member and we agreed to treat with Keflex.  Urine culture sent.  Otherwise, patient stable for discharge with close follow-up with her PCP in which patient and family agrees.  Return precautions discussed.  Care discussed with Dr. Rhunette Croft.   Fayrene Helper, PA-C 10/11/18 1478    Derwood Kaplan, MD 10/11/18 [phone]

## 2018-10-11 NOTE — ED Notes (Signed)
Urine and culture sent to lab  

## 2018-10-13 LAB — URINE CULTURE: Culture: 40000 — AB

## 2018-10-14 ENCOUNTER — Telehealth: Payer: Self-pay | Admitting: Emergency Medicine

## 2018-10-14 NOTE — Telephone Encounter (Signed)
Post ED Visit - Positive Culture Follow-up  Culture report reviewed by antimicrobial stewardship pharmacist:  []  Enzo BiNathan Batchelder, Pharm.D. []  Celedonio MiyamotoJeremy Frens, Pharm.D., BCPS AQ-ID []  Garvin FilaMike Maccia, Pharm.D., BCPS []  Georgina PillionElizabeth Martin, [address]Ph[address] BCPS []  WheatleyMinh Pham, [address]Ph[address]D., BCPS, AAHIVP []  Estella HuskMichelle Turner, Pharm.D., BCPS, AAHIVP []  Lysle Pearlachel Rumbarger, PharmD, BCPS []  Phillips Climeshuy Dang, PharmD, BCPS [x]  Agapito GamesAlison Masters, PharmD, BCPS []  Verlan FriendsErin Deja, PharmD  Positive urine culture Treated with cephalexin, organism sensitive to the same and no further patient follow-up is required at this time.  Berle MullMiller, Annick Dimaio 10/14/2018, 12:30 PM

## 2018-10-31 ENCOUNTER — Telehealth: Payer: Self-pay | Admitting: Neurology

## 2018-10-31 MED ORDER — QUETIAPINE FUMARATE 25 MG PO TABS: 25.0000 mg | ORAL_TABLET | Freq: Every day | ORAL | 3 refills | Status: DC

## 2018-10-31 NOTE — Telephone Encounter (Signed)
The husband had sent in the letter indicating that the patient is having a significant decline in cognitive functioning since starting the Sinemet for the Parkinson's disease.  The patient is having hallucinations, she is not sleeping well at night, she is up and down going to the bathroom.  The primary care physician did check a urinalysis, she did have a urinary tract infection and was treated with antibiotics but this behavior continues.  The patient is having hallucinations and delusional thinking.  The patient will be placed on low-dose Seroquel taking 25 mg at night, we may need to add a low-dose during the daytime as well, the patient is demonstrating some wandering behavior.  I will send in a prescription for the 25 mg Seroquel tablets, the patient will call for any dose adjustments.

## 2018-11-02 ENCOUNTER — Other Ambulatory Visit: Payer: Self-pay

## 2018-11-02 ENCOUNTER — Observation Stay (HOSPITAL_COMMUNITY): Payer: Medicare Other

## 2018-11-02 ENCOUNTER — Encounter (HOSPITAL_COMMUNITY): Payer: Self-pay

## 2018-11-02 ENCOUNTER — Emergency Department (HOSPITAL_COMMUNITY): Payer: Medicare Other

## 2018-11-02 ENCOUNTER — Inpatient Hospital Stay (HOSPITAL_COMMUNITY)
Admission: EM | Admit: 2018-11-02 | Discharge: 2018-11-06 | DRG: 640 | Disposition: A | Discharge status: Skilled Nursing Facility | Payer: Medicare Other | Attending: Internal Medicine | Admitting: Internal Medicine

## 2018-11-02 DIAGNOSIS — G92 Toxic encephalopathy: Secondary | ICD-10-CM

## 2018-11-02 DIAGNOSIS — Z7189 Other specified counseling: Secondary | ICD-10-CM | POA: Diagnosis not present

## 2018-11-02 DIAGNOSIS — G20 Parkinson's disease: Secondary | ICD-10-CM | POA: Diagnosis present

## 2018-11-02 DIAGNOSIS — G9341 Metabolic encephalopathy: Secondary | ICD-10-CM | POA: Diagnosis not present

## 2018-11-02 DIAGNOSIS — T428X5A Adverse effect of antiparkinsonism drugs and other central muscle-tone depressants, initial encounter: Secondary | ICD-10-CM | POA: Diagnosis present

## 2018-11-02 DIAGNOSIS — L8990 Pressure ulcer of unspecified site, unspecified stage: Secondary | ICD-10-CM

## 2018-11-02 DIAGNOSIS — B370 Candidal stomatitis: Secondary | ICD-10-CM | POA: Diagnosis present

## 2018-11-02 DIAGNOSIS — K760 Fatty (change of) liver, not elsewhere classified: Secondary | ICD-10-CM | POA: Diagnosis present

## 2018-11-02 DIAGNOSIS — Z79899 Other long term (current) drug therapy: Secondary | ICD-10-CM

## 2018-11-02 DIAGNOSIS — Z833 Family history of diabetes mellitus: Secondary | ICD-10-CM | POA: Diagnosis not present

## 2018-11-02 DIAGNOSIS — E860 Dehydration: Secondary | ICD-10-CM | POA: Diagnosis not present

## 2018-11-02 DIAGNOSIS — I10 Essential (primary) hypertension: Secondary | ICD-10-CM | POA: Diagnosis present

## 2018-11-02 DIAGNOSIS — F419 Anxiety disorder, unspecified: Secondary | ICD-10-CM | POA: Diagnosis present

## 2018-11-02 DIAGNOSIS — F0280 Dementia in other diseases classified elsewhere without behavioral disturbance: Secondary | ICD-10-CM | POA: Diagnosis present

## 2018-11-02 DIAGNOSIS — Z515 Encounter for palliative care: Secondary | ICD-10-CM

## 2018-11-02 DIAGNOSIS — G929 Unspecified toxic encephalopathy: Secondary | ICD-10-CM

## 2018-11-02 DIAGNOSIS — E785 Hyperlipidemia, unspecified: Secondary | ICD-10-CM | POA: Diagnosis present

## 2018-11-02 DIAGNOSIS — E46 Unspecified protein-calorie malnutrition: Secondary | ICD-10-CM | POA: Diagnosis present

## 2018-11-02 DIAGNOSIS — R0902 Hypoxemia: Secondary | ICD-10-CM | POA: Diagnosis present

## 2018-11-02 DIAGNOSIS — T424X5A Adverse effect of benzodiazepines, initial encounter: Secondary | ICD-10-CM | POA: Diagnosis present

## 2018-11-02 DIAGNOSIS — R627 Adult failure to thrive: Secondary | ICD-10-CM | POA: Diagnosis present

## 2018-11-02 DIAGNOSIS — L89159 Pressure ulcer of sacral region, unspecified stage: Secondary | ICD-10-CM | POA: Diagnosis present

## 2018-11-02 DIAGNOSIS — R1310 Dysphagia, unspecified: Secondary | ICD-10-CM | POA: Diagnosis present

## 2018-11-02 DIAGNOSIS — R451 Restlessness and agitation: Secondary | ICD-10-CM | POA: Diagnosis present

## 2018-11-02 DIAGNOSIS — K219 Gastro-esophageal reflux disease without esophagitis: Secondary | ICD-10-CM | POA: Diagnosis present

## 2018-11-02 HISTORY — DX: Parkinson's disease: G20

## 2018-11-02 HISTORY — DX: Parkinson's disease without dyskinesia, without mention of fluctuations: G20.A1

## 2018-11-02 LAB — CBC WITH DIFFERENTIAL/PLATELET
Abs Immature Granulocytes: 0.01 10*3/uL (ref 0.00–0.07)
Basophils Absolute: 0 10*3/uL (ref 0.0–0.1)
Basophils Relative: 0 %
Eosinophils Absolute: 0.1 10*3/uL (ref 0.0–0.5)
Eosinophils Relative: 1 %
HEMATOCRIT: 38.2 % (ref 36.0–46.0)
Hemoglobin: 12 g/dL (ref 12.0–15.0)
Immature Granulocytes: 0 %
Lymphocytes Relative: 18 %
Lymphs Abs: 1 10*3/uL (ref 0.7–4.0)
MCH: 31.6 pg (ref 26.0–34.0)
MCHC: 31.4 g/dL (ref 30.0–36.0)
MCV: 100.5 fL — ABNORMAL HIGH (ref 80.0–100.0)
Monocytes Absolute: 0.3 10*3/uL (ref 0.1–1.0)
Monocytes Relative: 5 %
Neutro Abs: 4.1 10*3/uL (ref 1.7–7.7)
Neutrophils Relative %: 76 %
Platelets: 112 10*3/uL — ABNORMAL LOW (ref 150–400)
RBC: 3.8 MIL/uL — ABNORMAL LOW (ref 3.87–5.11)
RDW: 14.6 % (ref 11.5–15.5)
WBC: 5.4 10*3/uL (ref 4.0–10.5)
nRBC: 0 % (ref 0.0–0.2)

## 2018-11-02 LAB — I-STAT CHEM 8, ED
BUN: 9 mg/dL (ref 8–23)
Calcium, Ion: 1.16 mmol/L (ref 1.15–1.40)
Chloride: 104 mmol/L (ref 98–111)
Creatinine, Ser: 0.7 mg/dL (ref 0.44–1.00)
Glucose, Bld: 128 mg/dL — ABNORMAL HIGH (ref 70–99)
HCT: 36 % (ref 36.0–46.0)
Hemoglobin: 12.2 g/dL (ref 12.0–15.0)
Potassium: 3.9 mmol/L (ref 3.5–5.1)
SODIUM: 141 mmol/L (ref 135–145)
TCO2: 27 mmol/L (ref 22–32)

## 2018-11-02 LAB — URINALYSIS, ROUTINE W REFLEX MICROSCOPIC
Bilirubin Urine: NEGATIVE
Glucose, UA: NEGATIVE mg/dL
Hgb urine dipstick: NEGATIVE
Ketones, ur: NEGATIVE mg/dL
Leukocytes, UA: NEGATIVE
NITRITE: NEGATIVE
PH: 7 (ref 5.0–8.0)
Protein, ur: NEGATIVE mg/dL
Specific Gravity, Urine: 1.018 (ref 1.005–1.030)

## 2018-11-02 LAB — COMPREHENSIVE METABOLIC PANEL
ALBUMIN: 3.3 g/dL — AB (ref 3.5–5.0)
ALT: <5 U/L (ref 0–44)
AST: 16 U/L (ref 15–41)
Alkaline Phosphatase: 75 U/L (ref 38–126)
Anion gap: 8 (ref 5–15)
BUN: 11 mg/dL (ref 8–23)
CO2: 28 mmol/L (ref 22–32)
Calcium: 8.4 mg/dL — ABNORMAL LOW (ref 8.9–10.3)
Chloride: 107 mmol/L (ref 98–111)
Creatinine, Ser: 0.72 mg/dL (ref 0.44–1.00)
GFR calc Af Amer: >60 mL/min (ref >60)
GFR calc non Af Amer: >60 mL/min (ref >60)
Glucose, Bld: 129 mg/dL — ABNORMAL HIGH (ref 70–99)
Potassium: 3.9 mmol/L (ref 3.5–5.1)
Sodium: 143 mmol/L (ref 135–145)
TOTAL PROTEIN: 6.5 g/dL (ref 6.5–8.1)
Total Bilirubin: 1 mg/dL (ref 0.3–1.2)

## 2018-11-02 LAB — I-STAT CG4 LACTIC ACID, ED
Lactic Acid, Venous: 0.87 mmol/L (ref 0.5–1.9)
Lactic Acid, Venous: 0.98 mmol/L (ref 0.5–1.9)

## 2018-11-02 LAB — TSH: TSH: 0.516 u[IU]/mL (ref 0.350–4.500)

## 2018-11-02 LAB — I-STAT TROPONIN, ED: Troponin i, poc: 0.01 ng/mL (ref 0.00–0.08)

## 2018-11-02 MED ORDER — QUETIAPINE FUMARATE 50 MG PO TABS
50.0000 mg | ORAL_TABLET | Freq: Every day | ORAL | Status: DC
  Filled 2018-11-02 (×2): qty 1

## 2018-11-02 MED ORDER — KCL IN DEXTROSE-NACL 20-5-0.45 MEQ/L-%-% IV SOLN
INTRAVENOUS | Status: DC
  Administered 2018-11-02 – 2018-11-04 (×3): via INTRAVENOUS
  Filled 2018-11-02 (×4): qty 1000

## 2018-11-02 MED ORDER — CARBIDOPA-LEVODOPA 25-100 MG PO TABS
0.5000 | ORAL_TABLET | Freq: Three times a day (TID) | ORAL | Status: DC
  Administered 2018-11-02 – 2018-11-06 (×11): 0.5 via ORAL
  Filled 2018-11-02: qty 0.5
  Filled 2018-11-02 (×5): qty 1
  Filled 2018-11-02: qty 0.5
  Filled 2018-11-02 (×7): qty 1

## 2018-11-02 MED ORDER — THIAMINE HCL 100 MG/ML IJ SOLN
500.0000 mg | Freq: Three times a day (TID) | INTRAVENOUS | Status: AC
  Administered 2018-11-02 – 2018-11-04 (×4): 500 mg via INTRAVENOUS
  Filled 2018-11-02 (×6): qty 5

## 2018-11-02 MED ORDER — ACETAMINOPHEN 650 MG RE SUPP: 650.0000 mg | Freq: Four times a day (QID) | RECTAL | Status: DC | PRN

## 2018-11-02 MED ORDER — ENOXAPARIN SODIUM 40 MG/0.4ML ~~LOC~~ SOLN
40.0000 mg | SUBCUTANEOUS | Status: DC
  Administered 2018-11-02 – 2018-11-05 (×4): 40 mg via SUBCUTANEOUS
  Filled 2018-11-02 (×4): qty 0.4

## 2018-11-02 MED ORDER — ONDANSETRON HCL 4 MG PO TABS
4.0000 mg | ORAL_TABLET | Freq: Three times a day (TID) | ORAL | Status: DC
  Administered 2018-11-02 – 2018-11-06 (×11): 4 mg via ORAL
  Filled 2018-11-02 (×12): qty 1

## 2018-11-02 MED ORDER — SODIUM CHLORIDE 0.9 % IV BOLUS
1000.0000 mL | Freq: Once | INTRAVENOUS | Status: AC
  Administered 2018-11-02: 1000 mL via INTRAVENOUS

## 2018-11-02 MED ORDER — TRAZODONE HCL 50 MG PO TABS
50.0000 mg | ORAL_TABLET | Freq: Every day | ORAL | Status: DC
  Administered 2018-11-02 – 2018-11-04 (×3): 50 mg via ORAL
  Filled 2018-11-02 (×3): qty 1

## 2018-11-02 MED ORDER — SODIUM CHLORIDE 0.9 % IV BOLUS
2000.0000 mL | Freq: Once | INTRAVENOUS | Status: AC
  Administered 2018-11-02: 2000 mL via INTRAVENOUS

## 2018-11-02 MED ORDER — ACETAMINOPHEN 325 MG PO TABS: 650.0000 mg | ORAL_TABLET | Freq: Four times a day (QID) | ORAL | Status: DC | PRN

## 2018-11-02 NOTE — ED Notes (Signed)
She returns from CT at this time. Her sister, who is at her bedside points out pt. Has been hallucinating and speaking about and to people (relatives) who are deceased. Pt. Remains in no distress.

## 2018-11-02 NOTE — H&P (Signed)
History and Physical  Patient Name: Becky Juneauatricia P Loyd     JXB:[id number]    DOB: [birth year]    DOA: 11/02/2018 PCP: Romeo Rabonaplan, Michael, MD  Patient coming from: Home  Chief Complaint: Progressive weakness, confusion, poor PO intake      HPI: Becky Juneauatricia P Coll is a 73 y.o. 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  The patient is rambling and unable to provide any history.  Patient has had a rapid decline in function, mentation and nutrition in last few months (see below), presumably from her movement disorder.    In the last 2 weeks now, she has gotten progressively weaker, she is eating less and less, seems to be choking on food sometimes, and has had more hallucinations.  In the last few days, she has not had more than a bite or two of food.  She is talking to family members who are deceased and talking about dying.  Today, she could not longer stand up, and so family called EMS.  She has had no fever, productive cough, respiratory distress or complaints of dyspnea, nor urinary urgency or pain.  She is complaining of diffuse widespread pain.  ED course: - Temp 99.69F, heart rate 79, respirations 26, blood pressure 95/53, improved to 120/80 with fluids -Na 143, K 3.9, Cr 0.7, WBC 5.4K, Hgb 12 - Lactic acid 0.87 - TSH normal -Urinalysis without red cells or white cells or bacteria - Troponin negative -Chest x-ray showed elevated right hemidiaphragm, no effusion, opacity, or edema -ECG unremarkable -She was started on IV fluids in the hospital service were asked to evaluate for altered mental status, dehydration, and new progressive weakness     Per husband, with whom she lives, the patient has had a precipitous decline in the last year, that seems to be accelerating in the last few weeks:   -Whereas a year ago she was still driving short distances, and just had memory  problems and mild tremor -around 1 year ago, First referred to Neurology for memory loss and tremor (did not tolerate a short trial of Sinemet) -6 months ago she was already requiring assistance with bathing, dressing and had slowing gait, and  -then two months ago she had noticeably worse memory, repeating herself in conversation, now requiring assistance with getting out of a chair and steadying her when walking -Two months ago went back to Neurology, restarted sinemet, MMSE was 16/30 -Now in the last two weeks, worse decline, as above          ROS: Review of Systems  Constitutional: Positive for malaise/fatigue and weight loss (20 lbs in last 2 months). Negative for chills and fever.  Respiratory: Negative for cough, sputum production and shortness of breath.   Cardiovascular: Negative for chest pain and leg swelling.  Gastrointestinal: Negative for blood in stool, diarrhea, melena and vomiting.  Genitourinary: Negative for dysuria, frequency, hematuria and urgency.  Skin: Negative for rash.  Neurological: Positive for tremors (no change, chronic from Parkinsonism) and weakness. Negative for speech change and focal weakness.  All other systems reviewed and are negative.         Past Medical History:  Diagnosis Date  . Anal stenosis   . Anxiety   . 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 Depression   . Fatty liver   . GERD (gastroesophageal reflux disease)   . Hiatal hernia   . History  of anal fissures   . History of chronic constipation   . History of kidney stones    1973  . Hyperlipidemia   . Left ureteral calculus   . Parkinson's disease (HCC)   . Wears glasses     Past Surgical History:  Procedure Laterality Date  . ANAL FISSURECTOMY  1994   and Hemorrhoidectomy  . BOTOX INJECTION N/A 11/18/2013   Procedure: BOTOX INJECTION;  Surgeon: Hart Carwin, MD;  Location: [facility];  Service: Endoscopy;  Laterality: N/A;  . CHOLECYSTECTOMY OPEN  1982   . CYSTOSCOPY WITH RETROGRADE PYELOGRAM, URETEROSCOPY AND STENT PLACEMENT Left 06/06/2016   Procedure: CYSTOSCOPY WITH RETROGRADE PYELOGRAM, URETEROSCOPY AND STENT PLACEMENT;  Surgeon: Barron Alvine, MD;  Location: [facility];  Service: Urology;  Laterality: Left;  . FLEXIBLE SIGMOIDOSCOPY N/A 11/18/2013   Procedure: FLEXIBLE SIGMOIDOSCOPY/ with BOTOX;  Surgeon: Hart Carwin, MD;  Location: [facility];  Service: Endoscopy;  Laterality: N/A;  . HOLMIUM LASER APPLICATION Left 06/06/2016   Procedure: HOLMIUM LASER APPLICATION;  Surgeon: Barron Alvine, MD;  Location: [facility];  Service: Urology;  Laterality: Left;  . TONSILLECTOMY  age 42  . TOTAL ABDOMINAL HYSTERECTOMY W/ BILATERAL SALPINGOOPHORECTOMY  1984   and Appendectomy    Social History: Patient lives with her husband.  The patient used to walk without assistance, not in the last week.  Nonsmoker.  Allergies  Allergen Reactions  . Sinemet [Carbidopa W-Levodopa] Nausea And Vomiting    Able to tolerate with Zofran  . Sulfa Antibiotics Other (See Comments)    Flushing and hot    Family history: family history includes Breast cancer in her maternal aunt; Diabetes in her brother; Pancreatic cancer (age of onset: 50) in her maternal uncle.  Prior to Admission medications   Medication Sig Start Date End Date Taking? Authorizing Provider  ALPRAZolam Prudy Feeler) 0.5 MG tablet Take 0.5 mg by mouth 4 (four) times daily.    Yes [provider]  carbidopa-levodopa (SINEMET IR) 25-100 MG tablet 1/2 tablet twice a day for 3 weeks, then take 1/2 tablet three times a day Patient taking differently: Take 0.5 tablets by mouth 3 (three) times daily.  09/18/18  Yes York Spaniel, MD  cephALEXin (KEFLEX) 500 MG capsule Take 1 capsule (500 mg total) by mouth 4 (four) times daily. 10/11/18  Yes Fayrene Helper, PA-C  lansoprazole (PREVACID) 30 MG capsule Take 30 mg by mouth every morning.    Yes [provider]    Multiple Vitamins-Minerals (ONE-A-DAY WOMENS 50 PLUS PO) Take 1 tablet by mouth daily.   Yes [provider]  ondansetron (ZOFRAN) 4 MG tablet Take 1 tablet (4 mg total) by mouth every 8 (eight) hours as needed for nausea or vomiting. Patient taking differently: Take 4 mg by mouth every 8 (eight) hours. Takes with carbidopa-levodopa 01/16/18  Yes Huston Foley, MD  QUEtiapine (SEROQUEL) 25 MG tablet Take 1 tablet (25 mg total) by mouth at bedtime. 10/31/18  Yes York Spaniel, MD  tiZANidine (ZANAFLEX) 4 MG tablet Take 4 mg by mouth 2 (two) times daily. 10/16/18  Yes [provider]  traMADol (ULTRAM) 50 MG tablet Take 50 mg by mouth daily as needed for moderate pain.    Yes [provider]  traZODone (DESYREL) 50 MG tablet Take 50 mg by mouth at bedtime.  08/25/18  Yes [provider]  hydrOXYzine (ATARAX/VISTARIL) 25 MG tablet Take 1 tablet (25 mg total) by mouth every 6 (six) hours  as needed for anxiety, nausea or vomiting. 10/11/18   Fayrene Helper, PA-C       Physical Exam: BP 118/68   Pulse 86   Temp 99.7 F (37.6 C) (Rectal)   Resp 20   SpO2 93%  General appearance: Thin elderly ill-appearing adult female, awake but distracted, no acute distress.   Eyes: Anicteric, conjunctiva red, lids and lashes dry and crusted. PERRL.    ENT: No nasal deformity, discharge, epistaxis.  Hearing normal. OP very dry, no oral lesions, dentures in place, with plaques from poor oral hygiene on them, lips dry.   Neck: No neck masses.  Trachea midline.  No thyromegaly/tenderness. Lymph: No cervical or supraclavicular lymphadenopathy. Skin: Warm and dry.  No jaundice.  No suspicious rashes or lesions. Cardiac: Tachycardic, regular, nl S1-S2, no murmurs appreciated.  Capillary refill is brisk.  JVP normal.  No LE edema.  Radial pulses 2+ and symmetric. Respiratory: Normal respiratory rate and rhythm.  CTAB without rales or wheezes. Abdomen: Abdomen soft.  Nonfocal, mild  TTP, voluntary guarding. No ascites, distension, hepatosplenomegaly.   MSK: No deformities or effusions of the large joints of the upper or lower extremities bilaterally.  No cyanosis or clubbing. Neuro: Face symmetric, tongue midline, follows me with her eyes across the midline, other cranial nerve testing limited by confusion.  Sensation intact to light touch. Speech is fluent.  Muscle strength seems diminished on the left, but overall she has 3-4/5 strength, rigid muscle movement.  Tremor noted.  No nystagmus.    Psych: Awake and responding to questions, but attention diminished, distracted, irritable.  Appears to hold fixed delusions about her family.  Seems oriented to [place], year, self.  Behavior inappropriate, judgment and insight appears severely impaired.         Labs on Admission:  I have personally reviewed following labs and imaging studies: CBC: Recent Labs  Lab 11/02/18 1241 11/02/18 1242  WBC 5.4  --   NEUTROABS 4.1  --   HGB 12.0 12.2  HCT 38.2 36.0  MCV 100.5*  --   PLT 112*  --    Basic Metabolic Panel: Recent Labs  Lab 11/02/18 1241 11/02/18 1242  NA 143 141  K 3.9 3.9  CL 107 104  CO2 28  --   GLUCOSE 129* 128*  BUN 11 9  CREATININE 0.72 0.70  CALCIUM 8.4*  --    GFR: CrCl cannot be calculated (Unknown ideal weight.).  Liver Function Tests: Recent Labs  Lab 11/02/18 1241  AST 16  ALT <5  ALKPHOS 75  BILITOT 1.0  PROT 6.5  ALBUMIN 3.3*    Thyroid Function Tests: Recent Labs    11/02/18 1242  TSH 0.516   Sepsis Labs: Lactic acid 0.87           Radiological Exams on Admission: Personally reviewed chest x-ray personally reviewed, shows elevated right hemidiaphragm, no consolidation, effusion, or edema, CT head report reviewed, no acute intracranial process, chronic extra-axial thickening, appears to be stable from last March: Dg Chest 2 View  Result Date: 11/02/2018 CLINICAL DATA:  Altered mental status. EXAM: CHEST - 2 VIEW  COMPARISON:  Single-view of the chest 10/11/2018. PA and lateral chest 11/25/2017. FINDINGS: Mild prominence of the pulmonary interstitium diffusely is unchanged. No consolidative process, pneumothorax or effusion. Heart size is normal. Aortic atherosclerosis is noted. No acute or focal bony abnormality. IMPRESSION: No acute disease. Electronically Signed   By: Drusilla Kanner M.D.   On: 11/02/2018 12:31  Ct Head Wo Contrast  Result Date: 11/02/2018 CLINICAL DATA:  Muscle weakness. Anorexia. EXAM: CT HEAD WITHOUT CONTRAST TECHNIQUE: Contiguous axial images were obtained from the base of the skull through the vertex without intravenous contrast. COMPARISON:  01/22/2018 FINDINGS: Brain: Roughly similar to prior exams there are extra-axial fluid collections which could be from chronic subdural hematoma, dural thickening, or subdural hygromas. Given the patient's age there is a relative paucity of sulci. Basilar cisterns are patent but small. Ventricular size normal. Otherwise, the brainstem, cerebellum, cerebral peduncles, thalamus, basal ganglia, basilar cisterns, and ventricular system appear within normal limits. No mass lesion or acute CVA. Vascular: Unremarkable Skull: Unremarkable Sinuses/Orbits: Unremarkable Other: No supplemental non-categorized findings. IMPRESSION: 1. No acute intracranial findings. 2. Chronic appearance of extra-axial thickening/fluid collections which could be from chronic subdural hematomas, subdural hygromas, or chronic dural thickening. Electronically Signed   By: Gaylyn Rong M.D.   On: 11/02/2018 15:46    EKG: Independently reviewed.  Rate 79, QTc 439, no ST changes.       Assessment/Plan  Dehydration Appears severely dehydrated due to inability to eat for several days - IV fluids  Dysphagia Family report in the last several days, she seems to have weak swallowing/trouble swallowing -Speech evaluation -Obtain MRI brain  Parkinsonism -Restart Sinemet if  able to swallow, if no improvement in 24 hours, could increase to 4 times daily -Takes Zofran with Sinemet  Progressive failure to thrive Encephalopathy The differential here includes that she has Lewy body dementia (rapid time course, hallucinations now) rather than Parkinson's disease, or alternately that she has a mimic of movement disorder such as B12 deficiency, neurosyphilis.    The TSH is normal.  She has no nystagmus or oculomotor normality, but appears to have ataxia and encephalopathy, with poor oral intake that puts her at risk for Wernicke's. -Check a B12 level, RPR -I will trial empiric IV thiamine for 2 days -Hold Xanax and tizanidine  -PT evaluation, speech evaluation -IV fluids  -If no improvement with IV fluids or thiamine, and further testing and clinical course does not explain encephalopathy, will consult neurology         DVT prophylaxis: Lovenox  Code Status: FULL  Family Communication: Husband at bedside  Disposition Plan: Anticipate IV fluids, further testing.   Consults called: [place] Admission status: INPATIENT   At the time of admission, it appears that the appropriate admission status for this patient is INPATIENT. This is judged to be reasonable and necessary in order to provide the required intensity of service to ensure the patient's safety given: -presenting symptoms of confusion, altered mentation, weakness, inability to swallow -physical exam findings of tachycardia, hypotension, tahcypnea, and   -in the context of their chronic comorbidities of Parkinsonism, dementia    Together, these circumstances are felt to place her at high risk for further clinical deterioration threatening life, limb, or organ.  I certify that at the point of admission it is my clinical judgment that the patient will require inpatient hospital care spanning beyond 2 midnights from the point of admission and that early discharge would result in unnecessary risk of  decompensation and readmission or threat to life, limb or bodily function.   Medical decision making: Patient seen at 4:19 PM on 11/02/2018.  The patient was discussed with Dr. Adela Lank.  What exists of the patient's chart was reviewed in depth and summarized above.  Clinical condition: stable.        [name] Maile Linford [facility] Pager: please page  via [email or web address], enter password [email or web address]", and identify attending under [facility]

## 2018-11-02 NOTE — ED Notes (Signed)
Patient transported to CT 

## 2018-11-02 NOTE — ED Notes (Signed)
ED TO INPATIENT HANDOFF REPORT  Name/Age/Gender Becky Figueroa 73 y.o. female  Code Status    Code Status Orders  (From admission, onward)         Start     Ordered   11/02/18 1616  Full code  Continuous     11/02/18 1618        Code Status History    This patient has a current code status but no historical code status.    Advance Directive Documentation     Most Recent Value  Type of Advance Directive  Healthcare Power of Attorney, Living will  Pre-existing out of facility DNR order (yellow form or pink MOST form)  -  "MOST" Form in Place?  -      Home/SNF/Other Home  Chief Complaint Loss of appetite, Hypotension  Level of Care/Admitting Diagnosis ED Disposition    ED Disposition Condition Comment   Admit  Hospital Area: [facility] [[id number]]  Level of Care: Med-Surg [16]  Diagnosis: Dehydration [276.51.ICD-9-CM]  Admitting Physician: Alberteen Sam [[id number]]  Attending Physician: Alberteen Sam [[id number]]  PT Class (Do Not Modify): Observation [104]  PT Acc Code (Do Not Modify): Observation [10022]       Medical History Past Medical History:  Diagnosis Date  . Anal stenosis   . Anxiety   . 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 Depression   . Fatty liver   . GERD (gastroesophageal reflux disease)   . Hiatal hernia   . History of anal fissures   . History of chronic constipation   . History of kidney stones    1973  . Hyperlipidemia   . Left ureteral calculus   . Parkinson's disease (HCC)   . Wears glasses     Allergies Allergies  Allergen Reactions  . Sinemet [Carbidopa W-Levodopa] Nausea And Vomiting    Able to tolerate with Zofran  . Sulfa Antibiotics Other (See Comments)    Flushing and hot    IV Location/Drains/Wounds Patient Lines/Drains/Airways Status   Active Line/Drains/Airways    Name:   Placement date:   Placement time:   Site:   Days:   Peripheral IV 11/02/18  Anterior;Proximal;Right Forearm   11/02/18    1240    Forearm   less than 1   Ureteral Drain/Stent Left ureter 6 Fr.   06/06/16    1210    Left ureter   879   External Urinary Catheter   10/11/18    1914    -   22          Labs/Imaging Results for orders placed or performed during the hospital encounter of 11/02/18 (from the past 48 hour(s))  Urinalysis, Routine w reflex microscopic (not at [facility])     Status: Abnormal   Collection Time: 11/02/18 11:54 AM  Result Value Ref Range   Color, Urine AMBER (A) YELLOW    Comment: BIOCHEMICALS MAY BE AFFECTED BY COLOR   APPearance CLEAR CLEAR   Specific Gravity, Urine 1.018 1.005 - 1.030   pH 7.0 5.0 - 8.0   Glucose, UA NEGATIVE NEGATIVE mg/dL   Hgb urine dipstick NEGATIVE NEGATIVE   Bilirubin Urine NEGATIVE NEGATIVE   Ketones, ur NEGATIVE NEGATIVE mg/dL   Protein, ur NEGATIVE NEGATIVE mg/dL   Nitrite NEGATIVE NEGATIVE   Leukocytes, UA NEGATIVE NEGATIVE    Comment: Performed at [facility], [address] [place], [place] [address]  Comprehensive metabolic panel     Status:  Abnormal   Collection Time: 11/02/18 12:41 PM  Result Value Ref Range   Sodium 143 135 - 145 mmol/L   Potassium 3.9 3.5 - 5.1 mmol/L   Chloride 107 98 - 111 mmol/L   CO2 28 22 - 32 mmol/L   Glucose, Bld 129 (H) 70 - 99 mg/dL   BUN 11 8 - 23 mg/dL   Creatinine, Ser 7.820.72 0.44 - 1.00 mg/dL   Calcium 8.4 (L) 8.9 - 10.3 mg/dL   Total Protein 6.5 6.5 - 8.1 g/dL   Albumin 3.3 (L) 3.5 - 5.0 g/dL   AST 16 15 - 41 U/L   ALT <5 0 - 44 U/L   Alkaline Phosphatase 75 38 - 126 U/L   Total Bilirubin 1.0 0.3 - 1.2 mg/dL   GFR calc non Af Amer >60 >60 mL/min   GFR calc Af Amer >60 >60 mL/min   Anion gap 8 5 - 15    Comment: Performed at [facility], [address] [place], [place] [address]  CBC WITH DIFFERENTIAL     Status: Abnormal   Collection Time: 11/02/18 12:41 PM  Result Value Ref Range   WBC 5.4 4.0 - 10.5 K/uL   RBC 3.80 (L)  3.87 - 5.11 MIL/uL   Hemoglobin 12.0 12.0 - 15.0 g/dL   HCT 30.838.2 65.736.0 - 84.646.0 %   MCV 100.5 (H) 80.0 - 100.0 fL   MCH 31.6 26.0 - 34.0 pg   MCHC 31.4 30.0 - 36.0 g/dL   RDW 96.214.6 95.211.5 - 84.115.5 %   Platelets 112 (L) 150 - 400 K/uL    Comment: REPEATED TO VERIFY PLATELET COUNT CONFIRMED BY SMEAR SPECIMEN CHECKED FOR CLOTS Immature Platelet Fraction may be clinically indicated, consider ordering this additional test [id number]    nRBC 0.0 0.0 - 0.2 %   Neutrophils Relative % 76 %   Neutro Abs 4.1 1.7 - 7.7 K/uL   Lymphocytes Relative 18 %   Lymphs Abs 1.0 0.7 - 4.0 K/uL   Monocytes Relative 5 %   Monocytes Absolute 0.3 0.1 - 1.0 K/uL   Eosinophils Relative 1 %   Eosinophils Absolute 0.1 0.0 - 0.5 K/uL   Basophils Relative 0 %   Basophils Absolute 0.0 0.0 - 0.1 K/uL   Immature Granulocytes 0 %   Abs Immature Granulocytes 0.01 0.00 - 0.07 K/uL    Comment: Performed at [facility], [address] [place], [place] [address]  I-stat troponin, ED     Status: None   Collection Time: 11/02/18 12:41 PM  Result Value Ref Range   Troponin i, poc 0.01 0.00 - 0.08 ng/mL   Comment 3            Comment: Due to the release kinetics of cTnI, a negative result within the first hours of the onset of symptoms does not rule out myocardial infarction with certainty. If myocardial infarction is still suspected, repeat the test at appropriate intervals.   I-Stat CG4 Lactic Acid, ED  (not at  [facility])     Status: None   Collection Time: 11/02/18 12:42 PM  Result Value Ref Range   Lactic Acid, Venous 0.87 0.5 - 1.9 mmol/L  TSH     Status: None   Collection Time: 11/02/18 12:42 PM  Result Value Ref Range   TSH 0.516 0.350 - 4.500 uIU/mL    Comment: Performed by a 3rd Generation assay with a functional sensitivity of <=0.01 uIU/mL. Performed at [facility],  [address], [place], [place] [address]   I-Stat Chem 8, ED     Status: Abnormal   Collection Time:  11/02/18 12:42 PM  Result Value Ref Range   Sodium 141 135 - 145 mmol/L   Potassium 3.9 3.5 - 5.1 mmol/L   Chloride 104 98 - 111 mmol/L   BUN 9 8 - 23 mg/dL   Creatinine, Ser 8.41 0.44 - 1.00 mg/dL   Glucose, Bld 660 (H) 70 - 99 mg/dL   Calcium, Ion 6.30 1.60 - 1.40 mmol/L   TCO2 27 22 - 32 mmol/L   Hemoglobin 12.2 12.0 - 15.0 g/dL   HCT 10.9 32.3 - 55.7 %  I-Stat CG4 Lactic Acid, ED  (not at  [facility])     Status: None   Collection Time: 11/02/18  3:05 PM  Result Value Ref Range   Lactic Acid, Venous 0.98 0.5 - 1.9 mmol/L   Dg Chest 2 View  Result Date: 11/02/2018 CLINICAL DATA:  Altered mental status. EXAM: CHEST - 2 VIEW COMPARISON:  Single-view of the chest 10/11/2018. PA and lateral chest 11/25/2017. FINDINGS: Mild prominence of the pulmonary interstitium diffusely is unchanged. No consolidative process, pneumothorax or effusion. Heart size is normal. Aortic atherosclerosis is noted. No acute or focal bony abnormality. IMPRESSION: No acute disease. Electronically Signed   By: Drusilla Kanner M.D.   On: 11/02/2018 12:31   Ct Head Wo Contrast  Result Date: 11/02/2018 CLINICAL DATA:  Muscle weakness. Anorexia. EXAM: CT HEAD WITHOUT CONTRAST TECHNIQUE: Contiguous axial images were obtained from the base of the skull through the vertex without intravenous contrast. COMPARISON:  01/22/2018 FINDINGS: Brain: Roughly similar to prior exams there are extra-axial fluid collections which could be from chronic subdural hematoma, dural thickening, or subdural hygromas. Given the patient's age there is a relative paucity of sulci. Basilar cisterns are patent but small. Ventricular size normal. Otherwise, the brainstem, cerebellum, cerebral peduncles, thalamus, basal ganglia, basilar cisterns, and ventricular system appear within normal limits. No mass lesion or acute CVA. Vascular: Unremarkable Skull: Unremarkable Sinuses/Orbits: Unremarkable Other: No supplemental non-categorized findings. IMPRESSION: 1. No  acute intracranial findings. 2. Chronic appearance of extra-axial thickening/fluid collections which could be from chronic subdural hematomas, subdural hygromas, or chronic dural thickening. Electronically Signed   By: Gaylyn Rong M.D.   On: 11/02/2018 15:46   EKG Interpretation  Date/Time:  Sunday November 02 2018 11:59:48 EST Ventricular Rate:  79 PR Interval:    QRS Duration: 91 QT Interval:  383 QTC Calculation: 439 R Axis:   0 Text Interpretation:  Sinus rhythm No significant change since last tracing Confirmed by Floyd, Dan ([id number]) on 11/02/2018 1:45:24 PM   Pending Labs Unresulted Labs (From admission, onward)    Start     Ordered   11/09/18 0500  Creatinine, serum  (enoxaparin (LOVENOX)    CrCl >/= 30 ml/min)  Weekly,   R    Comments:  while on enoxaparin therapy    11/02/18 1618   11/03/18 0500  Basic metabolic panel  Tomorrow morning,   R     11/02/18 1618   11/03/18 0500  CBC  Tomorrow morning,   R     11/02/18 1618   11/03/18 0500  HIV Antibody (routine testing w rflx)  Tomorrow morning,   R     11/02/18 1618   11/02/18 1617  Vitamin B12  Add-on,   R     01 /05/20 1618   11/02/18 1617  RPR  Add-on,   R  11/02/18 1618   11/02/18 1154  Blood Culture (routine x 2)  BLOOD CULTURE X 2,   STAT     11/02/18 1154   11/02/18 1154  Urine culture  ONCE - STAT,   STAT     11/02/18 1154          Vitals/Pain Today's Vitals   11/02/18 1400 11/02/18 1430 11/02/18 1500 11/02/18 1515  BP: 115/62 108/64 118/68 118/68  Pulse: 91 84  86  Resp: (!) 26 (!) 21 17 20   Temp:      TempSrc:      SpO2: 93% 92%  93%    Isolation Precautions No active isolations  Medications Medications  QUEtiapine (SEROQUEL) tablet 50 mg (has no administration in time range)  traZODone (DESYREL) tablet 50 mg (has no administration in time range)  ondansetron (ZOFRAN) tablet 4 mg (has no administration in time range)  carbidopa-levodopa (SINEMET IR) 25-100 MG per tablet immediate release  0.5 tablet (has no administration in time range)  enoxaparin (LOVENOX) injection 40 mg (has no administration in time range)  dextrose 5 % and 0.45 % NaCl with KCl 20 mEq/L infusion (has no administration in time range)  acetaminophen (TYLENOL) tablet 650 mg (has no administration in time range)    Or  acetaminophen (TYLENOL) suppository 650 mg (has no administration in time range)  thiamine 500mg  in normal saline (50ml) IVPB (has no administration in time range)  sodium chloride 0.9 % bolus 2,000 mL (0 mLs Intravenous Stopped 11/02/18 1400)  sodium chloride 0.9 % bolus 1,000 mL (1,000 mLs Intravenous New Bag/Given 11/02/18 1500)    Mobility walks with person assist
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
## 2018-11-02 NOTE — ED Triage Notes (Signed)
She comes to [place] from home with c/o (according to her family) of "not eating enough to keep a bird alive" x 1 1/2 weeks. Pt. Is awake and confused with clear speech. She recognizes her husband and becomes angry upon seeing him. Dr. Adela Lank meets her upon arrival and speaks with her brother, who is an active participant in her home care. She is in no distress.

## 2018-11-02 NOTE — ED Notes (Signed)
TTS [name]) has performed her assessment.

## 2018-11-02 NOTE — ED Provider Notes (Addendum)
[facility]-EMERGENCY DEPT Provider Note   CSN: [id number] Arrival date & time: 11/02/18  1125     History   Chief Complaint Chief Complaint  Patient presents with  . Weakness    HPI Becky Figueroa is a 73 y.o. female.  73 yo F with a chief complaint of weakness and not eating and drinking.  This been going on for the past couple weeks per the family but looking at her record it looks like it is been going on for the past month or so.  Family states that normally she was independent and since she had been started on medicines for her dementia she had had had a more rapid decline.  She has been having some difficulty swallowing and has had generalized weakness.  She had recently been diagnosed with a urinary tract infection and started on antibiotics which she has completed.  Today she was unable to get up out of bed and so they brought her to the ED for evaluation.  Per EMS she had had some coughing in route and was found to be hypoxic into the mid 80s.  The history is provided by the patient.  Weakness  Primary symptoms include no dizziness. Pertinent negatives include no shortness of breath, no chest pain, no vomiting and no headaches.  Illness  This is a new problem. The current episode started more than 1 week ago. The problem occurs constantly. The problem has been rapidly worsening. Pertinent negatives include no chest pain, no abdominal pain, no headaches and no shortness of breath. Nothing aggravates the symptoms. Nothing relieves the symptoms. She has tried nothing for the symptoms. The treatment provided no relief.    Past Medical History:  Diagnosis Date  . Anal stenosis   . Anxiety   . 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 Depression   . Fatty liver   . GERD (gastroesophageal reflux disease)   . Hiatal hernia   . History of anal fissures   . History of chronic constipation   . History of kidney stones    1973  .  Hyperlipidemia   . Left ureteral calculus   . Wears glasses     Patient Active Problem List   Diagnosis Date Noted  . Dehydration 11/02/2018  . 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 Nausea 01/20/2018  . Diarrhea 11/26/2013  . Fecal incontinence 11/26/2013  . 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 Anal fissure 03/09/2013  . Unspecified constipation 03/09/2013  . ANXIETY 08/01/2010  . GERD 08/01/2010  . DYSPEPSIA 08/01/2010  . IRRITABLE BOWEL SYNDROME 08/01/2010    Past Surgical History:  Procedure Laterality Date  . ANAL FISSURECTOMY  1994   and Hemorrhoidectomy  . BOTOX INJECTION N/A 11/18/2013   Procedure: BOTOX INJECTION;  Surgeon: Hart Carwin, MD;  Location: [facility];  Service: Endoscopy;  Laterality: N/A;  . CHOLECYSTECTOMY OPEN  1982  . CYSTOSCOPY WITH RETROGRADE PYELOGRAM, URETEROSCOPY AND STENT PLACEMENT Left 06/06/2016   Procedure: CYSTOSCOPY WITH RETROGRADE PYELOGRAM, URETEROSCOPY AND STENT PLACEMENT;  Surgeon: Barron Alvine, MD;  Location: [facility];  Service: Urology;  Laterality: Left;  . FLEXIBLE SIGMOIDOSCOPY N/A 11/18/2013   Procedure: FLEXIBLE SIGMOIDOSCOPY/ with BOTOX;  Surgeon: Hart Carwin, MD;  Location: [facility];  Service: Endoscopy;  Laterality: N/A;  . HOLMIUM LASER APPLICATION Left 06/06/2016   Procedure: HOLMIUM LASER APPLICATION;  Surgeon: Barron Alvine, MD;  Location: [facility];  Service: Urology;  Laterality: Left;  .  TONSILLECTOMY  age 89  . TOTAL ABDOMINAL HYSTERECTOMY W/ BILATERAL SALPINGOOPHORECTOMY  1984   and Appendectomy     OB History   No obstetric history on file.      Home Medications    Prior to Admission medications   Medication Sig Start Date End Date Taking? Authorizing Provider  ALPRAZolam Prudy Feeler(XANAX) 0.5 MG tablet Take 0.5 mg by mouth 4 (four) times daily.    Yes [provider]  carbidopa-levodopa (SINEMET IR) 25-100 MG tablet 1/2 tablet twice a day for 3 weeks, then take 1/2  tablet three times a day Patient taking differently: Take 0.5 tablets by mouth 3 (three) times daily.  09/18/18  Yes York SpanielWillis, Charles K, MD  cephALEXin (KEFLEX) 500 MG capsule Take 1 capsule (500 mg total) by mouth 4 (four) times daily. 10/11/18  Yes Fayrene Helperran, Bowie, PA-C  lansoprazole (PREVACID) 30 MG capsule Take 30 mg by mouth every morning.    Yes [provider]  Multiple Vitamins-Minerals (ONE-A-DAY WOMENS 50 PLUS PO) Take 1 tablet by mouth daily.   Yes [provider]  ondansetron (ZOFRAN) 4 MG tablet Take 1 tablet (4 mg total) by mouth every 8 (eight) hours as needed for nausea or vomiting. Patient taking differently: Take 4 mg by mouth every 8 (eight) hours. Takes with carbidopa-levodopa 01/16/18  Yes Huston FoleyAthar, Saima, MD  QUEtiapine (SEROQUEL) 25 MG tablet Take 1 tablet (25 mg total) by mouth at bedtime. 10/31/18  Yes York SpanielWillis, Charles K, MD  tiZANidine (ZANAFLEX) 4 MG tablet Take 4 mg by mouth 2 (two) times daily. 10/16/18  Yes [provider]  traMADol (ULTRAM) 50 MG tablet Take 50 mg by mouth daily as needed for moderate pain.    Yes [provider]  traZODone (DESYREL) 50 MG tablet Take 50 mg by mouth at bedtime.  08/25/18  Yes [provider]  hydrOXYzine (ATARAX/VISTARIL) 25 MG tablet Take 1 tablet (25 mg total) by mouth every 6 (six) hours as needed for anxiety, nausea or vomiting. 10/11/18   Fayrene Helperran, Bowie, PA-C    Family History Family History  Problem Relation Age of Onset  . Breast cancer Maternal Aunt        x 4  . Pancreatic cancer Maternal Uncle 56  . Diabetes Brother   . Colon cancer Neg Hx     Social History Social History   Tobacco Use  . Smoking status: Never Smoker  . Smokeless tobacco: Never Used  Substance Use Topics  . Alcohol use: No  . Drug use: No     Allergies   Sinemet [carbidopa w-levodopa] and Sulfa antibiotics   Review of Systems Review of Systems  Constitutional: Negative for chills and fever.  HENT:  Positive for trouble swallowing. Negative for congestion and rhinorrhea.   Eyes: Negative for redness and visual disturbance.  Respiratory: Negative for shortness of breath and wheezing.   Cardiovascular: Negative for chest pain and palpitations.  Gastrointestinal: Negative for abdominal pain, nausea and vomiting.  Genitourinary: Negative for dysuria and urgency.  Musculoskeletal: Negative for arthralgias and myalgias.  Skin: Negative for pallor and wound.  Neurological: Positive for weakness. Negative for dizziness and headaches.     Physical Exam Updated Vital Signs BP 118/68   Pulse 86   Temp 99.7 F (37.6 C) (Rectal)   Resp 20   SpO2 93%   Physical Exam Vitals signs and nursing note reviewed.  Constitutional:      General: She is not in acute distress.    Appearance:  She is well-developed. She is not diaphoretic.     Comments: Cachectic  HENT:     Head: Normocephalic and atraumatic.     Nose: Congestion present.  Eyes:     Pupils: Pupils are equal, round, and reactive to light.  Neck:     Musculoskeletal: Normal range of motion and neck supple.  Cardiovascular:     Rate and Rhythm: Normal rate and regular rhythm.     Heart sounds: No murmur. No friction rub. No gallop.   Pulmonary:     Effort: Pulmonary effort is normal.     Breath sounds: No wheezing or rales.  Abdominal:     General: There is no distension.     Palpations: Abdomen is soft.     Tenderness: There is no abdominal tenderness.  Musculoskeletal:        General: No tenderness.     Comments: Erythema to the sacrum without break in the skin, blanching  Skin:    General: Skin is warm and dry.  Neurological:     Mental Status: She is alert and oriented to person, place, and time.  Psychiatric:        Behavior: Behavior normal.      ED Treatments / Results  Labs (all labs ordered are listed, but only abnormal results are displayed) Labs Reviewed  COMPREHENSIVE METABOLIC PANEL - Abnormal; Notable  for the following components:      Result Value   Glucose, Bld 129 (*)    Calcium 8.4 (*)    Albumin 3.3 (*)    All other components within normal limits  CBC WITH DIFFERENTIAL/PLATELET - Abnormal; Notable for the following components:   RBC 3.80 (*)    MCV 100.5 (*)    Platelets 112 (*)    All other components within normal limits  URINALYSIS, ROUTINE W REFLEX MICROSCOPIC - Abnormal; Notable for the following components:   Color, Urine AMBER (*)    All other components within normal limits  I-STAT CHEM 8, ED - Abnormal; Notable for the following components:   Glucose, Bld 128 (*)    All other components within normal limits  CULTURE, BLOOD (ROUTINE X 2)  CULTURE, BLOOD (ROUTINE X 2)  URINE CULTURE  TSH  I-STAT CG4 LACTIC ACID, ED  I-STAT TROPONIN, ED  I-STAT CG4 LACTIC ACID, ED    EKG EKG Interpretation  Date/Time:  Sunday November 02 2018 11:59:48 EST Ventricular Rate:  79 PR Interval:    QRS Duration: 91 QT Interval:  383 QTC Calculation: 439 R Axis:   0 Text Interpretation:  Sinus rhythm No significant change since last tracing Confirmed by Melene Plan [phone]) on 11/02/2018 1:45:24 PM   Radiology Dg Chest 2 View  Result Date: 11/02/2018 CLINICAL DATA:  Altered mental status. EXAM: CHEST - 2 VIEW COMPARISON:  Single-view of the chest 10/11/2018. PA and lateral chest 11/25/2017. FINDINGS: Mild prominence of the pulmonary interstitium diffusely is unchanged. No consolidative process, pneumothorax or effusion. Heart size is normal. Aortic atherosclerosis is noted. No acute or focal bony abnormality. IMPRESSION: No acute disease. Electronically Signed   By: Drusilla Kanner M.D.   On: 11/02/2018 12:31   Ct Head Wo Contrast  Result Date: 11/02/2018 CLINICAL DATA:  Muscle weakness. Anorexia. EXAM: CT HEAD WITHOUT CONTRAST TECHNIQUE: Contiguous axial images were obtained from the base of the skull through the vertex without intravenous contrast. COMPARISON:  01/22/2018 FINDINGS:  Brain: Roughly similar to prior exams there are extra-axial fluid collections which could be from  chronic subdural hematoma, dural thickening, or subdural hygromas. Given the patient's age there is a relative paucity of sulci. Basilar cisterns are patent but small. Ventricular size normal. Otherwise, the brainstem, cerebellum, cerebral peduncles, thalamus, basal ganglia, basilar cisterns, and ventricular system appear within normal limits. No mass lesion or acute CVA. Vascular: Unremarkable Skull: Unremarkable Sinuses/Orbits: Unremarkable Other: No supplemental non-categorized findings. IMPRESSION: 1. No acute intracranial findings. 2. Chronic appearance of extra-axial thickening/fluid collections which could be from chronic subdural hematomas, subdural hygromas, or chronic dural thickening. Electronically Signed   By: Gaylyn RongWalter  Liebkemann M.D.   On: 11/02/2018 15:46    Procedures Procedures (including critical care time)  Medications Ordered in ED Medications  sodium chloride 0.9 % bolus 2,000 mL (0 mLs Intravenous Stopped 11/02/18 1400)  sodium chloride 0.9 % bolus 1,000 mL (1,000 mLs Intravenous New Bag/Given 11/02/18 1500)     Initial Impression / Assessment and Plan / ED Course  I have reviewed the triage vital signs and the nursing notes.  Pertinent labs & imaging results that were available during my care of the patient were reviewed by me and considered in my medical decision making (see chart for details).     73 yo F with a chief complaint of generalized weakness and decreased p.o. intake for the past week or so.  Family is concerned about her and stated that she has had a precipitous decline over the past few days.  Today was too weak to even get up and walk.  Recently on antibiotics.  Now with a new sacral ulcer.  Patient was hypoxic on my initial exam.  Oxygen saturation was 88 at rest.  She is unable to get up and exert herself.  Troponin is negative lactate was normal.  Blood cultures  were sent for possible bacterial infection.  UA was clean.  Chest x-ray reviewed by me with chronic infiltrates.  No leukocytosis or anemia.  The patient has improved significantly with IV fluids, much more alert still confused to scenario.  We will give a second liter.  Oxygen saturation in the low 90s on reassessment.  CT the head with new diffuse weakness was ordered and no acute bleeding on my initial review.  Will discuss with hospitalist for possible admission.  Will admit.   CRITICAL CARE Performed by: Rae Roamaniel Patrick Milarose Savich   Total critical care time: 35 minutes  Critical care time was exclusive of separately billable procedures and treating other patients.  Critical care was necessary to treat or prevent imminent or life-threatening deterioration.  Critical care was time spent personally by me on the following activities: development of treatment plan with patient and/or surrogate as well as nursing, discussions with consultants, evaluation of patient's response to treatment, examination of patient, obtaining history from patient or surrogate, ordering and performing treatments and interventions, ordering and review of laboratory studies, ordering and review of radiographic studies, pulse oximetry and re-evaluation of patient's condition.   The patients results and plan were reviewed and discussed.   Any x-rays performed were independently reviewed by myself.   Differential diagnosis were considered with the presenting HPI.  Medications  sodium chloride 0.9 % bolus 2,000 mL (0 mLs Intravenous Stopped 11/02/18 1400)  sodium chloride 0.9 % bolus 1,000 mL (1,000 mLs Intravenous New Bag/Given 11/02/18 1500)    Vitals:   11/02/18 1400 11/02/18 1430 11/02/18 1500 11/02/18 1515  BP: 115/62 108/64 118/68 118/68  Pulse: 91 84  86  Resp: (!) 26 (!) 21 17 20   Temp:  TempSrc:      SpO2: 93% 92%  93%    Final diagnoses:  Dehydration    Admission/ observation were discussed with the  admitting physician, patient and/or family and they are comfortable with the plan.    Final Clinical Impressions(s) / ED Diagnoses   Final diagnoses:  Dehydration    ED Discharge Orders    None       Melene Plan, DO 11/02/18 1603    Melene Plan, DO 11/10/18 1044

## 2018-11-02 NOTE — ED Notes (Signed)
I have just given report to Vickie, RN on [address]. Will transport her upon her return from MRI.

## 2018-11-02 NOTE — ED Notes (Signed)
Patient transported to MRI 

## 2018-11-03 ENCOUNTER — Other Ambulatory Visit: Payer: Self-pay

## 2018-11-03 DIAGNOSIS — G9341 Metabolic encephalopathy: Secondary | ICD-10-CM

## 2018-11-03 DIAGNOSIS — L8990 Pressure ulcer of unspecified site, unspecified stage: Secondary | ICD-10-CM

## 2018-11-03 DIAGNOSIS — B370 Candidal stomatitis: Secondary | ICD-10-CM

## 2018-11-03 LAB — HIV ANTIBODY (ROUTINE TESTING W REFLEX): HIV SCREEN 4TH GENERATION: NONREACTIVE

## 2018-11-03 LAB — BASIC METABOLIC PANEL
Anion gap: 6 (ref 5–15)
BUN: 5 mg/dL — ABNORMAL LOW (ref 8–23)
CALCIUM: 8 mg/dL — AB (ref 8.9–10.3)
CO2: 24 mmol/L (ref 22–32)
CREATININE: 0.61 mg/dL (ref 0.44–1.00)
Chloride: 112 mmol/L — ABNORMAL HIGH (ref 98–111)
GFR calc Af Amer: >60 mL/min (ref >60)
Glucose, Bld: 130 mg/dL — ABNORMAL HIGH (ref 70–99)
Potassium: 4 mmol/L (ref 3.5–5.1)
Sodium: 142 mmol/L (ref 135–145)

## 2018-11-03 LAB — CBC
HCT: 37.2 % (ref 36.0–46.0)
Hemoglobin: 11.5 g/dL — ABNORMAL LOW (ref 12.0–15.0)
MCH: 31.1 pg (ref 26.0–34.0)
MCHC: 30.9 g/dL (ref 30.0–36.0)
MCV: 100.5 fL — ABNORMAL HIGH (ref 80.0–100.0)
Platelets: 92 10*3/uL — ABNORMAL LOW (ref 150–400)
RBC: 3.7 MIL/uL — ABNORMAL LOW (ref 3.87–5.11)
RDW: 14.1 % (ref 11.5–15.5)
WBC: 3.6 10*3/uL — ABNORMAL LOW (ref 4.0–10.5)
nRBC: 0 % (ref 0.0–0.2)

## 2018-11-03 LAB — RPR: RPR: NONREACTIVE

## 2018-11-03 LAB — VITAMIN B12: Vitamin B-12: 390 pg/mL (ref 180–914)

## 2018-11-03 MED ORDER — TRAMADOL HCL 50 MG PO TABS
50.0000 mg | ORAL_TABLET | Freq: Four times a day (QID) | ORAL | Status: DC | PRN
  Administered 2018-11-03 – 2018-11-06 (×7): 50 mg via ORAL
  Filled 2018-11-03 (×7): qty 1

## 2018-11-03 MED ORDER — ALPRAZOLAM 0.5 MG PO TABS
0.5000 mg | ORAL_TABLET | Freq: Three times a day (TID) | ORAL | Status: DC | PRN
  Administered 2018-11-03 – 2018-11-06 (×5): 0.5 mg via ORAL
  Filled 2018-11-03 (×5): qty 1

## 2018-11-03 MED ORDER — MORPHINE SULFATE (PF) 2 MG/ML IV SOLN
2.0000 mg | Freq: Once | INTRAVENOUS | Status: AC
  Administered 2018-11-03: 2 mg via INTRAVENOUS
  Filled 2018-11-03: qty 1

## 2018-11-03 MED ORDER — FLUCONAZOLE 100MG IVPB
100.0000 mg | INTRAVENOUS | Status: DC
  Administered 2018-11-03: 100 mg via INTRAVENOUS
  Filled 2018-11-03 (×2): qty 50

## 2018-11-03 NOTE — Progress Notes (Addendum)
[facility] PROGRESS NOTE    Progress Note  Becky Figueroa  QPY:[id number] DOB: 1946-03-26 DOA: 11/02/2018 PCP: Romeo Rabon, MD     Brief Narrative:   Becky Figueroa is an 73 y.o. female past medical history of Parkinson's disease, dementia, essential hypertension with new progressive weakness change in mentation and decreased oral intake.  Assessment/Plan:   Dehydration: MRI of the brain shows no acute findings. She was severely dehydrated on admission she was started on IV fluids. Dehydration has resolved. Physical therapy.  Acute metabolic encephalopathy: Likely due to medications in the setting of dehydration. Now resolved. TSH is normal B12 390 check an MMA RPR pending Hold Xanax and tizanidine which can contribute to acute encephalopathy in the setting of decreased oral intake. Her family relate her mentation is improved.  Oral thrush: Started on IV Diflucan.  Dementia in Parkinson's disease [facility]): Continue Zofran and Sinemet.  Pessure injury of skin: Turn patient every 2 hours.  RN Pressure Injury Documentation: Pressure Injury 11/02/18 Deep Tissue Injury - Purple or maroon localized area of discolored intact skin or blood-filled blister due to damage of underlying soft tissue from pressure and/or shear. cloudy dark purple (Active)  11/02/18 1739  Location: Sacrum  Location Orientation: Posterior;Mid  Staging: Deep Tissue Injury - Purple or maroon localized area of discolored intact skin or blood-filled blister due to damage of underlying soft tissue from pressure and/or shear.  Wound Description (Comments): cloudy dark purple  Present on Admission: Yes     Pressure Injury 11/02/18 pink area from a fall (Active)  11/02/18 1744  Location: Elbow  Location Orientation: Left;Posterior  Staging:   Wound Description (Comments): pink area from a fall  Present on Admission:     DVT prophylaxis: lovenxo Family Communication:daughter and  son Disposition Plan/Barrier to D/C: SNF Code Status:     Code Status Orders  (From admission, onward)         Start     Ordered   11/02/18 1616  Full code  Continuous     11/02/18 1618        Code Status History    This patient has a current code status but no historical code status.    Advance Directive Documentation     Most Recent Value  Type of Advance Directive  Healthcare Power of Attorney, Living will  Pre-existing out of facility DNR order (yellow form or pink MOST form)  -  "MOST" Form in Place?  -        IV Access:    Peripheral IV   Procedures and diagnostic studies:   Dg Chest 2 View  Result Date: 11/02/2018 CLINICAL DATA:  Altered mental status. EXAM: CHEST - 2 VIEW COMPARISON:  Single-view of the chest 10/11/2018. PA and lateral chest 11/25/2017. FINDINGS: Mild prominence of the pulmonary interstitium diffusely is unchanged. No consolidative process, pneumothorax or effusion. Heart size is normal. Aortic atherosclerosis is noted. No acute or focal bony abnormality. IMPRESSION: No acute disease. Electronically Signed   By: Drusilla Kanner M.D.   On: 11/02/2018 12:31   Ct Head Wo Contrast  Result Date: 11/02/2018 CLINICAL DATA:  Muscle weakness. Anorexia. EXAM: CT HEAD WITHOUT CONTRAST TECHNIQUE: Contiguous axial images were obtained from the base of the skull through the vertex without intravenous contrast. COMPARISON:  01/22/2018 FINDINGS: Brain: Roughly similar to prior exams there are extra-axial fluid collections which could be from chronic subdural hematoma, dural thickening, or subdural hygromas. Given the patient's age there is a relative  paucity of sulci. Basilar cisterns are patent but small. Ventricular size normal. Otherwise, the brainstem, cerebellum, cerebral peduncles, thalamus, basal ganglia, basilar cisterns, and ventricular system appear within normal limits. No mass lesion or acute CVA. Vascular: Unremarkable Skull: Unremarkable  Sinuses/Orbits: Unremarkable Other: No supplemental non-categorized findings. IMPRESSION: 1. No acute intracranial findings. 2. Chronic appearance of extra-axial thickening/fluid collections which could be from chronic subdural hematomas, subdural hygromas, or chronic dural thickening. Electronically Signed   By: Gaylyn Rong M.D.   On: 11/02/2018 15:46   Mr Brain Wo Contrast  Result Date: 11/02/2018 CLINICAL DATA:  73 y/o F; weakness and declining health in the last 2 weeks. EXAM: MRI HEAD WITHOUT CONTRAST TECHNIQUE: Axial DWI, sagittal T1, axial T2, axial T2 FLAIR, axial gradient echo, axial T1 sequences were acquired. The patient was unable to continue and additional sequences were not acquired. COMPARISON:  11/02/2018 CT head FINDINGS: Brain: Motion degradation of multiple sequences. No reduced diffusion to indicate acute or early subacute infarction. No abnormal susceptibility hypointensity to indicate intracranial hemorrhage. No significant structural or signal abnormality of the brain. No mass effect, hydrocephalus, or herniation. Thin 2-3 mm extra-axial collections over the cerebral convexities bilaterally with CSF signal compatible with chronic hygroma, no associated mass effect. Vascular: Normal flow voids. Skull and upper cervical spine: Hyperostosis frontalis interna. No abnormal marrow signal. Sinuses/Orbits: Negative. Other: None. IMPRESSION: Motion degraded study. 1. No acute intracranial abnormality identified. 2. 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 No associated mass effect. Electronically Signed   By: Mitzi Hansen M.D.   On: 11/02/2018 17:26     Medical Consultants:    None.  Anti-Infectives:   IV diflucan  Subjective:    Becky Figueroa she has no complains.  Objective:    Vitals:   11/02/18 1515 11/02/18 1801 11/02/18 2323 11/03/18 0531  BP: 118/68 116/68 (!) 97/57 (!) 110/58  Pulse: 86 89 79 82  Resp: 20 16 20 16   Temp:   98.2 F (36.8 C) 97.6 F (36.4 C) (!) 97.2 F (36.2 C)  TempSrc:  Oral Oral Oral  SpO2: 93% 94% 94% 94%    Intake/Output Summary (Last 24 hours) at 11/03/2018 0807 Last data filed at 11/03/2018 4656 Gross per 24 hour  Intake 4326.68 ml  Output 500 ml  Net 3826.68 ml   There were no vitals filed for this visit.  Exam: General exam: In no acute distress, cachectic Respiratory system: Good air crackle at lung bases bilaterally. Cardiovascular system: S1 & S2 heard, RRR.   Gastrointestinal system: Abdomen is nondistended, soft and nontender.  Central nervous system: Alert and oriented. No focal neurological deficits. Extremities: No pedal edema. Skin: No rashes, lesions or ulcers Psychiatry: Judgement and insight appear normal. Mood & affect appropriate.    Data Reviewed:    Labs: Basic Metabolic Panel: Recent Labs  Lab 11/02/18 1241 11/02/18 1242 11/03/18 0537  NA 143 141 142  K 3.9 3.9 4.0  CL 107 104 112*  CO2 28  --  24  GLUCOSE 129* 128* 130*  BUN 11 9 5*  CREATININE 0.72 0.70 0.61  CALCIUM 8.4*  --  8.0*   GFR CrCl cannot be calculated (Unknown ideal weight.). Liver Function Tests: Recent Labs  Lab 11/02/18 1241  AST 16  ALT <5  ALKPHOS 75  BILITOT 1.0  PROT 6.5  ALBUMIN 3.3*   No results for input(s): LIPASE, AMYLASE in the last 168 hours. No results for input(s): AMMONIA in the last 168 hours.  Coagulation profile No results for input(s): INR, PROTIME in the last 168 hours.  CBC: Recent Labs  Lab 11/02/18 1241 11/02/18 1242 11/03/18 0537  WBC 5.4  --  3.6*  NEUTROABS 4.1  --   --   HGB 12.0 12.2 11.5*  HCT 38.2 36.0 37.2  MCV 100.5*  --  100.5*  PLT 112*  --  92*   Cardiac Enzymes: No results for input(s): CKTOTAL, CKMB, CKMBINDEX, TROPONINI in the last 168 hours. BNP (last 3 results) No results for input(s): PROBNP in the last 8760 hours. CBG: No results for input(s): GLUCAP in the last 168 hours. D-Dimer: No results for input(s):  DDIMER in the last 72 hours. Hgb A1c: No results for input(s): HGBA1C in the last 72 hours. Lipid Profile: No results for input(s): CHOL, HDL, LDLCALC, TRIG, CHOLHDL, LDLDIRECT in the last 72 hours. Thyroid function studies: Recent Labs    11/02/18 1242  TSH 0.516   Anemia work up: Recent Labs    11/03/18 0537  VITAMINB12 390   Sepsis Labs: Recent Labs  Lab 11/02/18 1241 11/02/18 1242 11/02/18 1505 11/03/18 0537  WBC 5.4  --   --  3.6*  LATICACIDVEN  --  0.87 0.98  --    Microbiology No results found for this or any previous visit (from the past 240 hour(s)).   Medications:   . carbidopa-levodopa  0.5 tablet Oral TID  . enoxaparin (LOVENOX) injection  40 mg Subcutaneous Q24H  . ondansetron  4 mg Oral TID  . QUEtiapine  50 mg Oral QHS  . traZODone  50 mg Oral QHS   Continuous Infusions: . dextrose 5 % and 0.45 % NaCl with KCl 20 mEq/L 125 mL/hr at 11/03/18 0658  . thiamine injection Stopped (11/02/18 2229)     LOS: 1 day   Marinda ElkAbraham Feliz Ortiz  [facility]   *Please refer to [email or web address], password [email or web address] to get updated schedule on who will round on this patient, as hospitalists switch teams weekly. If 7PM-7AM, please contact night-coverage at [email or web address], password [email or web address] for any overnight needs.  11/03/2018, 8:07 AM

## 2018-11-03 NOTE — Evaluation (Signed)
Physical Therapy Evaluation Patient Details Name: Becky Figueroa MRN: [id number] DOB: 05-21-1946 Today's Date: 11/03/2018   History of Present Illness  Becky Figueroa is an 73 y.o. female past medical history of Parkinson's disease, dementia, essential hypertension with new progressive weakness change in mentation and decreased oral intake 11/02/18  Clinical Impression  The patient did ambulate with 1 assist using RW. Patient';s spouse present and agreeable to SNF if patient is not able to ambulate as her baseline. Pt admitted with above diagnosis. Pt currently with functional limitations due to the deficits listed below (see PT Problem List).  Pt will benefit from skilled PT to increase their independence and safety with mobility to allow discharge to the venue listed below.       Follow Up Recommendations SNF    Equipment Recommendations  None recommended by PT    Recommendations for Other Services       Precautions / Restrictions Precautions Precautions: Fall Precaution Comments: incontinent      Mobility  Bed Mobility Overal bed mobility: Needs Assistance Bed Mobility: Supine to Sit     Supine to sit: Mod assist     General bed mobility comments: patient initiated  moving legs, extra time to push self up to sitting.  Transfers Overall transfer level: Needs assistance Equipment used: Rolling walker (2 wheeled) Transfers: Sit to/from [employer] Sit to Stand: Mod assist Stand pivot transfers: Mod assist       General transfer comment: Assist to stand from bed and recliner with multimodal cues, extra time to rise. Tactile cues to sit down to recliner as patient would not sit down.   Ambulation/Gait Ambulation/Gait assistance: Min assist Gait Distance (Feet): 50 Feet Assistive device: Rolling walker (2 wheeled) Gait Pattern/deviations: Step-to pattern;Step-through pattern;Festinating     General Gait Details: patient did ambulate with RW ,  assist to guide RW , tending to list to left.  Stairs            Wheelchair Mobility    Modified Rankin (Stroke Patients Only)       Balance Overall balance assessment: Needs assistance Sitting-balance support: Feet supported;Bilateral upper extremity supported Sitting balance-Leahy Scale: Fair   Postural control: Right lateral lean Standing balance support: During functional activity;Bilateral upper extremity supported Standing balance-Leahy Scale: Poor                               Pertinent Vitals/Pain Pain Assessment: No/denies pain(Simultaneous filing. User may not have seen previous data.)    Home Living Family/patient expects to be discharged to:: Private residence Living Arrangements: Spouse/significant other Available Help at Discharge: Family Type of Home: House Home Access: Level entry       Home Equipment: None      Prior Function Level of Independence: Needs assistance   Gait / Transfers Assistance Needed: per spouse, was ambulatory in home, assisted with bath and meals           Hand Dominance        Extremity/Trunk Assessment   Upper Extremity Assessment Upper Extremity Assessment: Generalized weakness;RUE deficits/detail;LUE deficits/detail RUE Deficits / Details: noted tremors- of UE's LUE Deficits / Details: tremors noted.    Lower Extremity Assessment Lower Extremity Assessment: Generalized weakness;RLE deficits/detail;LLE deficits/detail    Cervical / Trunk Assessment Cervical / Trunk Assessment: Kyphotic  Communication      Cognition Arousal/Alertness: Awake/alert Behavior During Therapy: WFL for tasks assessed/performed Overall Cognitive Status: Difficult  to assess                                 General Comments: patient oriented to "Amado", not date.      General Comments      Exercises     Assessment/Plan    PT Assessment Patient needs continued PT services  PT Problem List  Decreased strength;Decreased activity tolerance;Decreased balance;Decreased mobility;Decreased knowledge of precautions;Decreased safety awareness;Decreased knowledge of use of DME;Decreased cognition       PT Treatment Interventions DME instruction;Gait training;Functional mobility training;Therapeutic activities;Patient/family education;Cognitive remediation;Therapeutic exercise;Balance training    PT Goals (Current goals can be found in the Care Plan section)  Acute Rehab PT Goals Patient Stated Goal: to walk again PT Goal Formulation: With patient/family Time For Goal Achievement: 11/17/18 Potential to Achieve Goals: Fair    Frequency Min 2X/week   Barriers to discharge Decreased caregiver support      Co-evaluation               AM-PAC PT "6 Clicks" Mobility  Outcome Measure Help needed turning from your back to your side while in a flat bed without using bedrails?: A Lot Help needed moving from lying on your back to sitting on the side of a flat bed without using bedrails?: A Lot Help needed moving to and from a bed to a chair (including a wheelchair)?: Total Help needed standing up from a chair using your arms (e.g., wheelchair or bedside chair)?: Total Help needed to walk in hospital room?: Total Help needed climbing 3-5 steps with a railing? : Total 6 Click Score: 8    End of Session Equipment Utilized During Treatment: Gait belt Activity Tolerance: Patient tolerated treatment well Patient left: in chair;with call bell/phone within reach;with chair alarm set;with family/visitor present Nurse Communication: Mobility status PT Visit Diagnosis: Unsteadiness on feet (R26.81)    Time: 1132-1201 PT Time Calculation (min) (ACUTE ONLY): 29 min   Charges:   PT Evaluation $PT Eval Low Complexity: 1 Low PT Treatments $Gait Training: 8-22 mins        Blanchard KelchKaren Keashia Haskins PT Acute Rehabilitation Services Pager [phone] Office [phone]   Rada HayHill, Cai Flott  Elizabeth 11/03/2018, 1:33 PM

## 2018-11-03 NOTE — Evaluation (Signed)
Clinical/Bedside Swallow Evaluation Patient Details  Name: MARENA BUCKHANNON MRN: [id number] Date of Birth: April 17, 1946  Today's Date: 11/03/2018 Time: SLP Start Time (ACUTE ONLY): 1210 SLP Stop Time (ACUTE ONLY): 1253 SLP Time Calculation (min) (ACUTE ONLY): 43 min  Past Medical History:  Past Medical History:  Diagnosis Date  . Anal stenosis   . Anxiety   . 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 Depression   . Fatty liver   . GERD (gastroesophageal reflux disease)   . Hiatal hernia   . History of anal fissures   . History of chronic constipation   . History of kidney stones    1973  . Hyperlipidemia   . Left ureteral calculus   . Parkinson's disease (HCC)   . Wears glasses    Past Surgical History:  Past Surgical History:  Procedure Laterality Date  . ANAL FISSURECTOMY  1994   and Hemorrhoidectomy  . BOTOX INJECTION N/A 11/18/2013   Procedure: BOTOX INJECTION;  Surgeon: Hart Carwin, MD;  Location: [facility];  Service: Endoscopy;  Laterality: N/A;  . CHOLECYSTECTOMY OPEN  1982  . CYSTOSCOPY WITH RETROGRADE PYELOGRAM, URETEROSCOPY AND STENT PLACEMENT Left 06/06/2016   Procedure: CYSTOSCOPY WITH RETROGRADE PYELOGRAM, URETEROSCOPY AND STENT PLACEMENT;  Surgeon: Barron Alvine, MD;  Location: [facility];  Service: Urology;  Laterality: Left;  . FLEXIBLE SIGMOIDOSCOPY N/A 11/18/2013   Procedure: FLEXIBLE SIGMOIDOSCOPY/ with BOTOX;  Surgeon: Hart Carwin, MD;  Location: [facility];  Service: Endoscopy;  Laterality: N/A;  . HOLMIUM LASER APPLICATION Left 06/06/2016   Procedure: HOLMIUM LASER APPLICATION;  Surgeon: Barron Alvine, MD;  Location: [facility];  Service: Urology;  Laterality: Left;  . TONSILLECTOMY  age 44  . TOTAL ABDOMINAL HYSTERECTOMY W/ BILATERAL SALPINGOOPHORECTOMY  1984   and Appendectomy   HPI:  pt is a 73 yo female adm to [facility] with poor intake and AMS.  Pt has h/o Parkinsonism, anorexia, dementia, tremor - found to  be profoundly dehydrated.   CXR negative 11/02/2018.  Swallow eval ordered.  Spouse reports pt with weight loss from 147 to 124 in the last six months.  Pt states eating is not enjoyable any more.  Pt has been consuming only a few bites/sips before she stops intake.  She has not has recurrent pneumonias or required the heimlich manuever.       Assessment / Plan / Recommendation Clinical Impression  Patient presents with signs of mild oropharyngeal dysphagia likely due to her Parkinsonism and cognitive deficit.  Notes indicate pt also had oral candidiasis upon admit and is being treated.  She has mild left facial asymmetry and decreased labial movement - spouse states left side is weaker.  Pt with + s/s of aspiration with thin via straw while trying 3 ounce water test.  Small single boluses tolerated well.  She is weak and does benefit from assistance with feeding.  Pt only took a few bites/sips before stating she was finished  - denies dysphagia being source of decreased intake/weight loss.  Recommend continue diet with precautions.   Educated pt and her spouse [name] to recommendations for dysphagia mitigation strategies using teach back and written instrutions.  Also provided information re: voice amplifier for home use and encouraged pt to speak at maximum effort to strengthen voice/airway protection.    SLP will follow up x1 to assure tolerance and all education completed adequately.  Messaged MD to inquire re: dietician consult and that spouse asking if appetite stimulant may  be helpful.   SLP Visit Diagnosis: Dysphagia, oropharyngeal phase (R13.12)    Aspiration Risk  Mild aspiration risk(with appropriate precautions)    Diet Recommendation Regular;Thin liquid(ordering soft foods, extra gravy/sauces)   Liquid Administration via: Cup;Straw Medication Administration: Whole meds with puree Supervision: Comment;Full supervision/cueing for compensatory strategies(spouse can help pt  eat) Compensations: Slow rate;Small sips/bites;Lingual sweep for clearance of pocketing Postural Changes: Seated upright at 90 degrees;Remain upright for at least 30 minutes after po intake    Other  Recommendations Oral Care Recommendations: Oral care BID   Follow up Recommendations None      Frequency and Duration min 1 x/week  1 week       Prognosis Prognosis for Safe Diet Advancement: Fair Barriers to Reach Goals: Cognitive deficits;Motivation      Swallow Study   General Date of Onset: 11/03/18 HPI: pt is a 73 yo female adm to [facility] with poor intake and AMS.  Pt has h/o Parkinsonism, anorexia, dementia, tremor - found to be profoundly dehydrated.   CXR negative 11/02/2018.  Swallow eval ordered.  Spouse reports pt with weight loss from 147 to 124 in the last six months.  Pt states eating is not enjoyable any more.  Pt has been consuming only a few bites/sips before she stops intake.  She has not has recurrent pneumonias or required the heimlich manuever.     Type of Study: Bedside Swallow Evaluation Diet Prior to this Study: Regular;Thin liquids Temperature Spikes Noted: No Respiratory Status: Room air History of Recent Intubation: No Behavior/Cognition: Alert;Other (Comment)(delayed responses) Oral Cavity Assessment: Within Functional Limits Oral Care Completed by SLP: No Oral Cavity - Dentition: Dentures, top;Other (Comment)(lower frontal dentition present) Self-Feeding Abilities: Needs assist;Other (Comment)(pt is weak) Patient Positioning: Upright in bed(but leaning slightly to right - spouse state she is weak on the left) Baseline Vocal Quality: Low vocal intensity Volitional Cough: Strong Volitional Swallow: Able to elicit    Oral/Motor/Sensory Function Overall Oral Motor/Sensory Function: Mild impairment Facial ROM: Reduced left Facial Symmetry: Abnormal symmetry left Facial Strength: Reduced left Lingual ROM: Within Functional Limits Lingual Symmetry: Within  Functional Limits Lingual Strength: Within Functional Limits Velum: Within Functional Limits   Ice Chips Ice chips: Not tested   Thin Liquid Thin Liquid: Impaired Presentation: Straw Pharyngeal  Phase Impairments: Cough - Immediate Other Comments: 3 ounce water test was not passed as pt with cough after intake of 1.5 ounces, small single boluses tolerated well    Nectar Thick Nectar Thick Liquid: Within functional limits Other Comments: strawberry Ensure - pt consumes Boost at home   Honey Thick Honey Thick Liquid: Not tested   Puree Puree: Impaired Presentation: Self Fed Pharyngeal Phase Impairments: Suspected delayed Swallow;Throat Clearing - Delayed   Solid     Solid: Within functional limits Presentation: Self Fed      Chales Abrahams 11/03/2018,1:30 PM   Donavan Burnet, MS [facility] SLP Acute Rehab Services Pager [phone] Office [phone]

## 2018-11-04 DIAGNOSIS — G20 Parkinson's disease: Secondary | ICD-10-CM

## 2018-11-04 DIAGNOSIS — G92 Toxic encephalopathy: Secondary | ICD-10-CM

## 2018-11-04 DIAGNOSIS — Z7189 Other specified counseling: Secondary | ICD-10-CM

## 2018-11-04 DIAGNOSIS — E860 Dehydration: Principal | ICD-10-CM

## 2018-11-04 DIAGNOSIS — F0280 Dementia in other diseases classified elsewhere without behavioral disturbance: Secondary | ICD-10-CM

## 2018-11-04 DIAGNOSIS — R627 Adult failure to thrive: Secondary | ICD-10-CM

## 2018-11-04 DIAGNOSIS — G929 Unspecified toxic encephalopathy: Secondary | ICD-10-CM

## 2018-11-04 DIAGNOSIS — Z515 Encounter for palliative care: Secondary | ICD-10-CM

## 2018-11-04 LAB — URINE CULTURE: Culture: NO GROWTH

## 2018-11-04 MED ORDER — TIZANIDINE HCL 4 MG PO TABS
2.0000 mg | ORAL_TABLET | Freq: Two times a day (BID) | ORAL | Status: DC
  Administered 2018-11-04 – 2018-11-06 (×5): 2 mg via ORAL
  Filled 2018-11-04 (×5): qty 1

## 2018-11-04 MED ORDER — FLUCONAZOLE 100 MG PO TABS
100.0000 mg | ORAL_TABLET | Freq: Every day | ORAL | Status: DC
  Administered 2018-11-04 – 2018-11-06 (×3): 100 mg via ORAL
  Filled 2018-11-04 (×3): qty 1

## 2018-11-04 MED ORDER — POLYVINYL ALCOHOL 1.4 % OP SOLN
1.0000 [drp] | OPHTHALMIC | Status: DC | PRN
  Administered 2018-11-04: 1 [drp] via OPHTHALMIC
  Filled 2018-11-04: qty 15

## 2018-11-04 NOTE — Consult Note (Signed)
Consultation Note Date: 11/04/2018   Patient Name: Becky Figueroa  DOB: 07-22-1946  MRN: [id number]  Age / Sex: 73 y.o., female  PCP: Richmond Campbell., PA-C Referring Physician: Marinda Elk, MD  Reason for Consultation: Establishing goals of care  HPI/Patient Profile: 73 y.o. female     admitted on 11/02/2018   Becky Figueroa is a 73 y.o. 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    Clinical Assessment and Goals of Care:  Becky Figueroa is a 73 y.o. F with hx Parkinson's disease on Sinemet, dementia, lives at home with husband, also has HTN who presents with new progressive weakness, change in mentation and oral intake.   History obtained, at the time of admission, notes that as per husband, with whom she lives, the patient has had a precipitous decline in the last year, that seems to be accelerating in the last few weeks:    -Whereas a year ago she was still driving short distances, and just had memory problems and mild tremor -around 1 year ago, First referred to Neurology for memory loss and tremor (did not tolerate a short trial of Sinemet) -6 months ago she was already requiring assistance with bathing, dressing and had slowing gait, and  -then two months ago she had noticeably worse memory, repeating herself in conversation, now requiring assistance with getting out of a chair and steadying her when walking -Two months ago went back to Neurology, restarted sinemet, MMSE was 16/30 -Now in the last two weeks, worse decline, as above.   Patient remains admitted to hospital medicine service, she was seen by PT and SLP. PT recommended SNF, SLP recommended regular diet with thin liquids.   A palliative consult has been requested for goals of care discussions.   The  patient is sitting up in a chair. Her husband and son are at the bedside. I introduced myself and palliative care as follows: Palliative medicine is specialized medical care for people living with serious illness. It focuses on providing relief from the symptoms and stress of a serious illness. The goal is to improve quality of life for both the patient and the family.  The patient states that she ate a good breakfast. Both son and husband state that they're waiting to discuss further regarding SNF. Reviewed with them about SNF  Rehab attempt.   Attempted goals, wishes and values discussions. While the patient is alert and does respond to a few yes/no type questions appropriately, she is not able to engage in long term goals of care discussions. She defers to her son and husband. They both state that she has made a living will delineating her choices, it is at home.   At this time, family is focused on efforts to find her a SNF. Will recommend out patient palliative care to further continue discussions regarding code status and overall goals of care.   Continue current mode of  care. See below. Thank you for the consult.    NEXT OF KIN  lives at home with husband She states that she has 4 kids Son Becky Figueroa is at the bedside this am.    SUMMARY OF RECOMMENDATIONS    remains full code for now Family (husband and son) would like to discuss with CSW regarding SNF rehab towards the end of this hospitalization. Recommend palliative care follow up with the patient at SNF.  Continue current mode of care Thank you for the consult.  Code Status/Advance Care Planning:  Full code    Symptom Management:    as above   Palliative Prophylaxis:   Delirium Protocol   Psycho-social/Spiritual:   Desire for further Chaplaincy support:yes  Additional Recommendations: Caregiving  Support/Resources  Prognosis:   Unable to determine  Discharge Planning: Skilled Nursing Facility for rehab  with Palliative care service follow-up      Primary Diagnoses: Present on Admission: . Dehydration . Parkinson's disease (HCC) . 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 Failure to thrive in adult   I have reviewed the medical record, interviewed the patient and family, and examined the patient. The following aspects are pertinent.  Past Medical History:  Diagnosis Date  . Anal stenosis   . Anxiety   . 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 Depression   . Fatty liver   . GERD (gastroesophageal reflux disease)   . Hiatal hernia   . History of anal fissures   . History of chronic constipation   . History of kidney stones    1973  . Hyperlipidemia   . Left ureteral calculus   . Parkinson's disease (HCC)   . Wears glasses    Social History   Socioeconomic History  . Marital status: Married    Spouse name: Not on file  . Number of children: 4  . Years of education: Not on file  . Highest education level: Not on file  Occupational History  . Occupation: Engineer, civil (consulting)    Comment: retired  Engineer, production  . Financial resource strain: Not on file  . Food insecurity:    Worry: Not on file    Inability: Not on file  . Transportation needs:    Medical: Not on file    Non-medical: Not on file  Tobacco Use  . Smoking status: Never Smoker  . Smokeless tobacco: Never Used  Substance and Sexual Activity  . Alcohol use: No  . Drug use: No  . Sexual activity: Not on file  Lifestyle  . Physical activity:    Days per week: Not on file    Minutes per session: Not on file  . Stress: Not on file  Relationships  . Social connections:    Talks on phone: Not on file    Gets together: Not on file    Attends religious service: Not on file    Active member of club or organization: Not on file    Attends meetings of clubs or organizations: Not on file    Relationship status: Not on file  Other Topics Concern  . Not on file  Social History Narrative  . Not on file    Family History  Problem Relation Age of Onset  . Breast cancer Maternal Aunt        x 4  . Pancreatic cancer Maternal Uncle 56  . Diabetes Brother   . Colon cancer Neg Hx    Scheduled Meds: . carbidopa-levodopa  0.5 tablet Oral TID  .  enoxaparin (LOVENOX) injection  40 mg Subcutaneous Q24H  . fluconazole  100 mg Oral Daily  . ondansetron  4 mg Oral TID  . QUEtiapine  50 mg Oral QHS  . tiZANidine  2 mg Oral BID  . traZODone  50 mg Oral QHS   Continuous Infusions: . dextrose 5 % and 0.45 % NaCl with KCl 20 mEq/L 10 mL/hr at 11/04/18 1027  . thiamine injection 500 mg (11/04/18 1028)   PRN Meds:.acetaminophen **OR** acetaminophen, ALPRAZolam, traMADol Medications Prior to Admission:  Prior to Admission medications   Medication Sig Start Date End Date Taking? Authorizing Provider  ALPRAZolam Prudy Feeler(XANAX) 0.5 MG tablet Take 0.5 mg by mouth 4 (four) times daily.    Yes [provider]  carbidopa-levodopa (SINEMET IR) 25-100 MG tablet 1/2 tablet twice a day for 3 weeks, then take 1/2 tablet three times a day Patient taking differently: Take 0.5 tablets by mouth 3 (three) times daily.  09/18/18  Yes York SpanielWillis, Charles K, MD  cephALEXin (KEFLEX) 500 MG capsule Take 1 capsule (500 mg total) by mouth 4 (four) times daily. 10/11/18  Yes Fayrene Helperran, Bowie, PA-C  lansoprazole (PREVACID) 30 MG capsule Take 30 mg by mouth every morning.    Yes [provider]  Multiple Vitamins-Minerals (ONE-A-DAY WOMENS 50 PLUS PO) Take 1 tablet by mouth daily.   Yes [provider]  ondansetron (ZOFRAN) 4 MG tablet Take 1 tablet (4 mg total) by mouth every 8 (eight) hours as needed for nausea or vomiting. Patient taking differently: Take 4 mg by mouth every 8 (eight) hours. Takes with carbidopa-levodopa 01/16/18  Yes Huston FoleyAthar, Saima, MD  QUEtiapine (SEROQUEL) 25 MG tablet Take 1 tablet (25 mg total) by mouth at bedtime. 10/31/18  Yes York SpanielWillis, Charles K, MD  tiZANidine (ZANAFLEX) 4 MG tablet Take 4 mg by  mouth 2 (two) times daily. 10/16/18  Yes [provider]  traMADol (ULTRAM) 50 MG tablet Take 50 mg by mouth daily as needed for moderate pain.    Yes [provider]  traZODone (DESYREL) 50 MG tablet Take 50 mg by mouth at bedtime.  08/25/18  Yes [provider]  hydrOXYzine (ATARAX/VISTARIL) 25 MG tablet Take 1 tablet (25 mg total) by mouth every 6 (six) hours as needed for anxiety, nausea or vomiting. 10/11/18   Fayrene Helperran, Bowie, PA-C   Allergies  Allergen Reactions  . Sinemet [Carbidopa W-Levodopa] Nausea And Vomiting    Able to tolerate with Zofran  . Sulfa Antibiotics Other (See Comments)    Flushing and hot   Review of Systems Denies pain  Physical Exam Elderly lady, appears weak In no distress Sitting up in chair Few crackles S 1 S 2  Abdomen is soft, not distended No edema Frail Non focal Answers few questions appropriately  Vital Signs: BP 116/70 (BP Location: Right Arm)   Pulse 86   Temp 97.9 F (36.6 C) (Oral)   Resp 18   SpO2 95%  Pain Scale: 0-10   Pain Score: 0-No pain   SpO2: SpO2: 95 % O2 Device:SpO2: 95 % O2 Flow Rate: .O2 Flow Rate (L/min): 1 L/min  IO: Intake/output summary:   Intake/Output Summary (Last 24 hours) at 11/04/2018 1341 Last data filed at 11/04/2018 0940 Gross per 24 hour  Intake 633.25 ml  Output -  Net 633.25 ml    LBM:   Baseline Weight:   Most recent weight:       Palliative Assessment/Data:   Flowsheet Rows     Most Recent  Value  Intake Tab  Referral Department  Hospitalist  Unit at Time of Referral  Oncology Unit  Date Notified  11/03/18  Palliative Care Type  New Palliative care  Reason for referral  Clarify Goals of Care  Date of Admission  11/02/18  # of days IP prior to Palliative referral  1  Clinical Assessment  Psychosocial & Spiritual Assessment  Palliative Care Outcomes     PPS 40%  Time In:  12 Time Out:  1300 Time Total:   60 min  Greater than 50%  of this time was spent  counseling and coordinating care related to the above assessment and plan.  Signed by: Rosalin HawkingZeba Hendrix Console, MD  [phone] Please contact Palliative Medicine Team phone at [phone] for questions and concerns.  For individual provider: See Loretha StaplerAmion
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
## 2018-11-04 NOTE — Progress Notes (Signed)
[facility] PROGRESS NOTE    Progress Note  Becky Juneauatricia P Figueroa  WUJ:[id number] DOB: 02-02-1946 DOA: 11/02/2018 PCP: Richmond CampbellKaplan, Kristen W., PA-C     Brief Narrative:   Becky Juneauatricia P Gemmer is an 73 y.o. female past medical history of Parkinson's disease, dementia, essential hypertension with new progressive weakness change in mentation and decreased oral intake.  Assessment/Plan:   Dehydration: MRI of the brain shows no acute findings. She was severely dehydrated on admission she was started on IV fluids. Dehydration has resolved. Physical therapy evaluated the patient recommended skilled nursing facility.  Acute toxic encephalopathy: Likely due to medications in the setting of dehydration. Now resolved. TSH is normal, HIV negative. B12 390, methylmalonic acid results are pending. RPR negative Xanax and tizanidine were held on admission which can contribute to acute encephalopathy in the setting of decreased oral intake. We will resume Xanax and tizanidine at a lower dose. Her family relate her mentation is improved.  Oral thrush: Tolerating her diet now change her to oral Diflucan  Dementia in Parkinson's disease [facility](HCC): Continue Zofran and Sinemet.  Pessure injury of skin: Turn patient every 2 hours.  RN Pressure Injury Documentation: Pressure Injury 11/02/18 Deep Tissue Injury - Purple or maroon localized area of discolored intact skin or blood-filled blister due to damage of underlying soft tissue from pressure and/or shear. cloudy dark purple (Active)  11/02/18 1739  Location: Sacrum  Location Orientation: Posterior;Mid  Staging: Deep Tissue Injury - Purple or maroon localized area of discolored intact skin or blood-filled blister due to damage of underlying soft tissue from pressure and/or shear.  Wound Description (Comments): cloudy dark purple  Present on Admission: Yes     Pressure Injury 11/02/18 pink area from a fall (Active)  11/02/18 1744  Location:  Elbow  Location Orientation: Left;Posterior  Staging:   Wound Description (Comments): pink area from a fall  Present on Admission:     DVT prophylaxis: lovenxo Family Communication:daughter and son Disposition Plan/Barrier to D/C: SNF in am Code Status:     Code Status Orders  (From admission, onward)         Start     Ordered   11/02/18 1616  Full code  Continuous     11/02/18 1618        Code Status History    This patient has a current code status but no historical code status.    Advance Directive Documentation     Most Recent Value  Type of Advance Directive  Healthcare Power of Attorney, Living will  Pre-existing out of facility DNR order (yellow form or pink MOST form)  -  "MOST" Form in Place?  -        IV Access:    Peripheral IV   Procedures and diagnostic studies:   Dg Chest 2 View  Result Date: 11/02/2018 CLINICAL DATA:  Altered mental status. EXAM: CHEST - 2 VIEW COMPARISON:  Single-view of the chest 10/11/2018. PA and lateral chest 11/25/2017. FINDINGS: Mild prominence of the pulmonary interstitium diffusely is unchanged. No consolidative process, pneumothorax or effusion. Heart size is normal. Aortic atherosclerosis is noted. No acute or focal bony abnormality. IMPRESSION: No acute disease. Electronically Signed   By: Drusilla Kannerhomas  Dalessio M.D.   On: 11/02/2018 12:31   Ct Head Wo Contrast  Result Date: 11/02/2018 CLINICAL DATA:  Muscle weakness. Anorexia. EXAM: CT HEAD WITHOUT CONTRAST TECHNIQUE: Contiguous axial images were obtained from the base of the skull through the vertex without intravenous contrast. COMPARISON:  01/22/2018  FINDINGS: Brain: Roughly similar to prior exams there are extra-axial fluid collections which could be from chronic subdural hematoma, dural thickening, or subdural hygromas. Given the patient's age there is a relative paucity of sulci. Basilar cisterns are patent but small. Ventricular size normal. Otherwise, the brainstem,  cerebellum, cerebral peduncles, thalamus, basal ganglia, basilar cisterns, and ventricular system appear within normal limits. No mass lesion or acute CVA. Vascular: Unremarkable Skull: Unremarkable Sinuses/Orbits: Unremarkable Other: No supplemental non-categorized findings. IMPRESSION: 1. No acute intracranial findings. 2. Chronic appearance of extra-axial thickening/fluid collections which could be from chronic subdural hematomas, subdural hygromas, or chronic dural thickening. Electronically Signed   By: Gaylyn Rong M.D.   On: 11/02/2018 15:46   Mr Brain Wo Contrast  Result Date: 11/02/2018 CLINICAL DATA:  73 y/o F; weakness and declining health in the last 2 weeks. EXAM: MRI HEAD WITHOUT CONTRAST TECHNIQUE: Axial DWI, sagittal T1, axial T2, axial T2 FLAIR, axial gradient echo, axial T1 sequences were acquired. The patient was unable to continue and additional sequences were not acquired. COMPARISON:  11/02/2018 CT head FINDINGS: Brain: Motion degradation of multiple sequences. No reduced diffusion to indicate acute or early subacute infarction. No abnormal susceptibility hypointensity to indicate intracranial hemorrhage. No significant structural or signal abnormality of the brain. No mass effect, hydrocephalus, or herniation. Thin 2-3 mm extra-axial collections over the cerebral convexities bilaterally with CSF signal compatible with chronic hygroma, no associated mass effect. Vascular: Normal flow voids. Skull and upper cervical spine: Hyperostosis frontalis interna. No abnormal marrow signal. Sinuses/Orbits: Negative. Other: None. IMPRESSION: Motion degraded study. 1. No acute intracranial abnormality identified. 2. 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 No associated mass effect. Electronically Signed   By: Mitzi Hansen M.D.   On: 11/02/2018 17:26     Medical Consultants:    None.  Anti-Infectives:   IV diflucan  Subjective:    Becky Figueroa  relates some difficulty with swallowing, was able to tolerate at least 25% of her diet.  Objective:    Vitals:   11/03/18 0531 11/03/18 1423 11/03/18 2314 11/04/18 0505  BP: (!) 110/58 101/61 (!) 106/57 116/70  Pulse: 82 87 77 86  Resp: 16 17 16 18   Temp: (!) 97.2 F (36.2 C) 98.1 F (36.7 C) 97.7 F (36.5 C) 97.9 F (36.6 C)  TempSrc: Oral Oral Oral Oral  SpO2: 94% 95% 94% 95%    Intake/Output Summary (Last 24 hours) at 11/04/2018 0924 Last data filed at 11/04/2018 0750 Gross per 24 hour  Intake 984.99 ml  Output -  Net 984.99 ml   There were no vitals filed for this visit.  Exam: General exam: In no acute distress, cachectic Respiratory system: Good air crackle at lung bases bilaterally. Cardiovascular system: S1 & S2 heard, RRR.   Gastrointestinal system: Abdomen is nondistended, soft and nontender.  Central nervous system: Alert and oriented. No focal neurological deficits. Extremities: No pedal edema. Skin: No rashes, lesions or ulcers Psychiatry: Judgement and insight appear normal. Mood & affect appropriate.    Data Reviewed:    Labs: Basic Metabolic Panel: Recent Labs  Lab 11/02/18 1241 11/02/18 1242 11/03/18 0537  NA 143 141 142  K 3.9 3.9 4.0  CL 107 104 112*  CO2 28  --  24  GLUCOSE 129* 128* 130*  BUN 11 9 5*  CREATININE 0.72 0.70 0.61  CALCIUM 8.4*  --  8.0*   GFR CrCl cannot be calculated (Unknown ideal weight.). Liver Function Tests: Recent Labs  Lab 11/02/18 1241  AST 16  ALT <5  ALKPHOS 75  BILITOT 1.0  PROT 6.5  ALBUMIN 3.3*   No results for input(s): LIPASE, AMYLASE in the last 168 hours. No results for input(s): AMMONIA in the last 168 hours. Coagulation profile No results for input(s): INR, PROTIME in the last 168 hours.  CBC: Recent Labs  Lab 11/02/18 1241 11/02/18 1242 11/03/18 0537  WBC 5.4  --  3.6*  NEUTROABS 4.1  --   --   HGB 12.0 12.2 11.5*  HCT 38.2 36.0 37.2  MCV 100.5*  --  100.5*  PLT 112*  --  92*    Cardiac Enzymes: No results for input(s): CKTOTAL, CKMB, CKMBINDEX, TROPONINI in the last 168 hours. BNP (last 3 results) No results for input(s): PROBNP in the last 8760 hours. CBG: No results for input(s): GLUCAP in the last 168 hours. D-Dimer: No results for input(s): DDIMER in the last 72 hours. Hgb A1c: No results for input(s): HGBA1C in the last 72 hours. Lipid Profile: No results for input(s): CHOL, HDL, LDLCALC, TRIG, CHOLHDL, LDLDIRECT in the last 72 hours. Thyroid function studies: Recent Labs    11/02/18 1242  TSH 0.516   Anemia work up: Recent Labs    11/03/18 0537  VITAMINB12 390   Sepsis Labs: Recent Labs  Lab 11/02/18 1241 11/02/18 1242 11/02/18 1505 11/03/18 0537  WBC 5.4  --   --  3.6*  LATICACIDVEN  --  0.87 0.98  --    Microbiology Recent Results (from the past 240 hour(s))  Urine culture     Status: None   Collection Time: 11/02/18 11:54 AM  Result Value Ref Range Status   Specimen Description   Final    URINE, RANDOM Performed at [facility], [address] [place], [place] [address]    Special Requests   Final    NONE Performed at [facility], [address] [place], [place] [address]    Culture   Final    NO GROWTH Performed at [facility], [address] [place], [place] [address]    Report Status 11/04/2018 FINAL  Final  Blood Culture (routine x 2)     Status: None (Preliminary result)   Collection Time: 11/02/18 11:59 AM  Result Value Ref Range Status   Specimen Description   Final    BLOOD RIGHT FOREARM Performed at [facility], [address] [place], [place] [address]    Special Requests   Final    BOTTLES DRAWN AEROBIC AND ANAEROBIC Blood Culture adequate volume Performed at [facility], [address] [place], [place] [address]    Culture   Final    NO GROWTH 2 DAYS Performed at [facility], [address]  [place], [place] [address]    Report Status PENDING  Incomplete  Blood Culture (routine x 2)     Status: None (Preliminary result)   Collection Time: 11/02/18 12:42 PM  Result Value Ref Range Status   Specimen Description   Final    RIGHT ANTECUBITAL Performed at [facility], [address] [place], [place] [address]    Special Requests   Final    BOTTLES DRAWN AEROBIC ONLY Blood Culture results may not be optimal due to an inadequate volume of blood received in culture bottles Performed at [facility], [address] [place], [place] [address]    Culture   Final  NO GROWTH 2 DAYS Performed at [facility], [address] [place], [place] [address]    Report Status PENDING  Incomplete     Medications:   . carbidopa-levodopa  0.5 tablet Oral TID  . enoxaparin (LOVENOX) injection  40 mg Subcutaneous Q24H  . ondansetron  4 mg Oral TID  . QUEtiapine  50 mg Oral QHS  . traZODone  50 mg Oral QHS   Continuous Infusions: . dextrose 5 % and 0.45 % NaCl with KCl 20 mEq/L Stopped (11/03/18 1310)  . fluconazole (DIFLUCAN) IV 100 mg (11/03/18 1445)  . thiamine injection 500 mg (11/03/18 2203)     LOS: 2 days   Marinda Elk  [facility]   *Please refer to [email or web address], password [email or web address] to get updated schedule on who will round on this patient, as hospitalists switch teams weekly. If 7PM-7AM, please contact night-coverage at [email or web address], password [email or web address] for any overnight needs.  11/04/2018, 9:24 AM

## 2018-11-05 MED ORDER — FLUCONAZOLE 100 MG PO TABS: 100.0000 mg | ORAL_TABLET | Freq: Every day | ORAL | 0 refills | Status: DC

## 2018-11-05 MED ORDER — TIZANIDINE HCL 4 MG PO TABS: 2.0000 mg | ORAL_TABLET | Freq: Two times a day (BID) | ORAL | 0 refills | Status: DC

## 2018-11-05 MED ORDER — ALPRAZOLAM 0.5 MG PO TABS: 0.5000 mg | ORAL_TABLET | Freq: Three times a day (TID) | ORAL | 0 refills | Status: DC | PRN

## 2018-11-05 NOTE — Progress Notes (Signed)
Physical Therapy Treatment Patient Details Name: Becky Figueroa MRN: [id number] DOB: [birth year] Today's Date: 11/05/2018    History of Present Illness Becky Figueroa is an 73 y.o. female past medical history of Parkinson's disease, dementia, essential hypertension with new progressive weakness change in mentation and decreased oral intake 11/02/18    PT Comments    Pt with improved ambulation distance and participated well in LE strengthening exercises. Pt lacking safety awareness during ambulation, and required assist for steadying and RW management. PT still recommending SNF to address mobility deficits and safety awareness deficits. PT to continue to follow acutely.    Follow Up Recommendations  SNF     Equipment Recommendations  None recommended by PT    Recommendations for Other Services       Precautions / Restrictions Precautions Precautions: Fall Precaution Comments: incontinent Restrictions Weight Bearing Restrictions: No    Mobility  Bed Mobility Overal bed mobility: Needs Assistance Bed Mobility: Supine to Sit     Supine to sit: Mod assist     General bed mobility comments: Mod assist for LE translation to EOB, trunk elevation. Pt assisted with LE movement and elevating trunk by using UEs. Increased time and effort to come to sitting.   Transfers Overall transfer level: Needs assistance Equipment used: Rolling walker (2 wheeled) Transfers: Sit to/from Stand Sit to Stand: Min assist         General transfer comment: Min assist for power up and steadying upon standing. Pt with 2 attempts at STS, first unsuccessful because pt performed inadequate power up. Pt with difficulty lowering back down to EOB after ambulation, required tactile, verbal, and demonstrative cues to initiate sitting.   Ambulation/Gait Ambulation/Gait assistance: Min assist Gait Distance (Feet): 100 Feet Assistive device: Rolling walker (2 wheeled) Gait Pattern/deviations:  Step-through pattern;Shuffle;Decreased stride length Gait velocity: decr    General Gait Details: min assist during ambulation for directing RW as pt had tendency to list to left of hallway, steadying, and placing RW on ground. Pt lifting RW at times, and was unsteady, lacking safety awareness.   Stairs             Wheelchair Mobility    Modified Rankin (Stroke Patients Only)       Balance Overall balance assessment: Needs assistance Sitting-balance support: Feet supported Sitting balance-Leahy Scale: Fair     Standing balance support: During functional activity;Bilateral upper extremity supported Standing balance-Leahy Scale: Poor Standing balance comment: relies on PT and RW for steadying                             Cognition Arousal/Alertness: Awake/alert Behavior During Therapy: WFL for tasks assessed/performed Overall Cognitive Status: History of cognitive impairments - at baseline                                 General Comments: Pt oriented to self. Pt states "why am I the only one here?"; PT oriented pt to hospital. Pt very talkative about past this session, discussing her second husband and "my marriage, or what there was of it".       Exercises Total Joint Exercises Bridges: AROM;Both;10 reps;Supine General Exercises - Lower Extremity Quad Sets: AROM;Both;10 reps;Supine Straight Leg Raises: AROM;10 reps;Both;Supine    General Comments        Pertinent Vitals/Pain Pain Assessment: Faces Faces Pain Scale: Hurts a little bit Pain  Location: "I just feel discomfort"  Pain Descriptors / Indicators: Discomfort Pain Intervention(s): Limited activity within patient's tolerance;Repositioned;Monitored during session    Home Living                      Prior Function            PT Goals (current goals can now be found in the care plan section) Acute Rehab PT Goals Patient Stated Goal: to walk again PT Goal  Formulation: With patient/family Time For Goal Achievement: 11/17/18 Potential to Achieve Goals: Fair Progress towards PT goals: Progressing toward goals    Frequency    Min 2X/week      PT Plan Current plan remains appropriate    Co-evaluation              AM-PAC PT "6 Clicks" Mobility   Outcome Measure  Help needed turning from your back to your side while in a flat bed without using bedrails?: A Lot Help needed moving from lying on your back to sitting on the side of a flat bed without using bedrails?: A Lot Help needed moving to and from a bed to a chair (including a wheelchair)?: A Lot Help needed standing up from a chair using your arms (e.g., wheelchair or bedside chair)?: A Little Help needed to walk in hospital room?: A Little Help needed climbing 3-5 steps with a railing? : A Lot 6 Click Score: 14    End of Session Equipment Utilized During Treatment: Gait belt Activity Tolerance: Patient tolerated treatment well Patient left: with call bell/phone within reach;with family/visitor present;in bed;with bed alarm set Nurse Communication: Mobility status PT Visit Diagnosis: Unsteadiness on feet (R26.81);Difficulty in walking, not elsewhere classified (R26.2)     Time: [id number] PT Time Calculation (min) (ACUTE ONLY): 24 min  Charges:  $Gait Training: 8-22 mins $Therapeutic Exercise: 8-22 mins                    Nicola PoliceAlexa D Domini Vandehei, PT Acute Rehabilitation Services Pager [phone]  Office [phone]    Sabree Nuon D Colm Lyford 11/05/2018, 1:29 PM

## 2018-11-05 NOTE — Progress Notes (Signed)
CSW completed [facility] APS report with Justice.   Stacy Gardner, LCSW Clinical Social Worker  [employer] Float  [phone]

## 2018-11-05 NOTE — Discharge Summary (Signed)
Discharge Summary  Becky Figueroa:[id number] DOB: [birth year]  PCP: Richmond Campbell., PA-C  Admit date: 11/02/2018 Discharge date: 11/05/2018  Time spent: 35 minutes  Recommendations for Outpatient Follow-up:  1. Follow-up with your PCP 2. Take your medications as prescribed 3. Continue physical therapy 4. Fall precautions  Discharge Diagnoses:  Active Hospital Problems   Diagnosis Date Noted  . Dehydration 11/02/2018  . Toxic encephalopathy 11/04/2018  . Palliative care by specialist   . Goals of care, counseling/discussion   . Pressure injury of skin 11/03/2018  . Acute metabolic encephalopathy 11/03/2018  . Failure to thrive in adult 11/02/2018  . 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 Parkinson's disease (HCC) 04/21/2018    Resolved Hospital Problems  No resolved problems to display.    Discharge Condition: Stable  Diet recommendation: Resume previous diet  Vitals:   11/04/18 2042 11/05/18 0521  BP: (!) 99/58 (!) 108/59  Pulse: 77 75  Resp: 18 14  Temp: 98.4 F (36.9 C) 97.7 F (36.5 C)  SpO2: 96% 96%    History of present illness:   Becky Figueroa is an 73 y.o. female past medical history of Parkinson's disease, dementia, essential hypertension with new progressive weakness change in mentation and decreased oral intake.  11/05/2018: Patient seen and examined at bedside.  Family present.  No acute events overnight.  She has no new complaints.  She is tolerated diet well.  Awaiting placement at SNF.  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 She was severely dehydrated on admission she was  started on IV fluids. Dehydration has resolved. Physical therapy evaluated the patient recommended skilled nursing facility.  Acute toxic encephalopathy: Likely due to medications in the setting of dehydration. Now resolved. TSH is normal, HIV negative. B12 390, methylmalonic acid results are pending. RPR negative Xanax and tizanidine were held on admission which can contribute to acute encephalopathy in the setting of decreased oral intake. We will resume Xanax and tizanidine at a lower dose. Her family relate her mentation is improved.  Oral thrush: Tolerating her diet now change her to oral Diflucan  Dementia in Parkinson's disease [facility]): Continue Zofran and Sinemet.  Pessure injury of skin: Turn patient every 2 hours.  RN Pressure Injury Documentation: Pressure Injury 11/02/18 Deep Tissue Injury - Purple or maroon localized area of discolored intact skin or blood-filled blister due to damage of underlying soft tissue from pressure and/or shear. cloudy dark purple (Active)  11/02/18 1739  Location: Sacrum  Location Orientation: Posterior;Mid  Staging: Deep Tissue Injury - Purple or maroon localized area of discolored intact skin or blood-filled blister due to damage of underlying soft tissue from pressure and/or shear.  Wound Description (Comments): cloudy dark purple  Present on Admission: Yes     Pressure Injury 11/02/18 pink area from a fall (Active)  11/02/18 1744  Location: Elbow  Location Orientation: Left;Posterior  Staging:   Wound Description (Comments): pink area from a fall  Present on Admission:     DVT prophylaxis: lovenxo Family Communication:daughter and son Disposition Plan/Barrier to D/C:  Discharge to skilled nursing facility when bed is available. Code Status:  Full  code            Discharge Exam: BP (!) 108/59 (BP Location: Left Arm)   Pulse 75   Temp 97.7 F (36.5 C) (Oral)   Resp 14   SpO2 96%  . General: 73 y.o. year-old  female well developed well nourished in no acute distress.  Alert and oriented x3. . Cardiovascular: Regular rate and rhythm with no rubs or gallops.  No thyromegaly or JVD noted.   Marland Kitchen Respiratory: Clear to auscultation with no wheezes or rales. Good inspiratory effort. . Abdomen: Soft nontender nondistended with normal bowel sounds x4 quadrants. . Musculoskeletal: No lower extremity edema. 2/4 pulses in all 4 extremities. . Skin: No ulcerative lesions noted or rashes, . Psychiatry: Mood is appropriate for condition and setting  Discharge Instructions You were cared for by a hospitalist during your hospital stay. If you have any questions about your discharge medications or the care you received while you were in the hospital after you are discharged, you can call the unit and asked to speak with the hospitalist on call if the hospitalist that took care of you is not available. Once you are discharged, your primary care physician will handle any further medical issues. Please note that NO REFILLS for any discharge medications will be authorized once you are discharged, as it is imperative that you return to your primary care physician (or establish a relationship with a primary care physician if you do not have one) for your aftercare needs so that they can reassess your need for medications and monitor your lab values.   Allergies as of 11/05/2018      Reactions   Sinemet [carbidopa W-levodopa] Nausea And Vomiting   Able to tolerate with Zofran   Sulfa Antibiotics Other (See Comments)   Flushing and hot      Medication List    STOP taking these medications   cephALEXin 500 MG capsule Commonly known as:  KEFLEX   hydrOXYzine 25 MG tablet Commonly known as:  ATARAX/VISTARIL   traZODone 50 MG tablet Commonly known as:  DESYREL     TAKE these medications   ALPRAZolam 0.5 MG tablet Commonly known as:  XANAX Take 1 tablet (0.5 mg total) by mouth 3 (three) times daily as needed for  anxiety. What changed:    when to take this  reasons to take this   carbidopa-levodopa 25-100 MG tablet Commonly known as:  SINEMET IR 1/2 tablet twice a day for 3 weeks, then take 1/2 tablet three times a day What changed:    how much to take  how to take this  when to take this  additional instructions   fluconazole 100 MG tablet Commonly known as:  DIFLUCAN Take 1 tablet (100 mg total) by mouth daily. Start taking on:  November 06, 2018   lansoprazole 30 MG capsule Commonly known as:  PREVACID Take 30 mg by mouth every morning.   ondansetron 4 MG tablet Commonly known as:  ZOFRAN Take 1 tablet (4 mg total) by mouth every 8 (eight) hours as needed for nausea or vomiting. What changed:    when to take this  additional instructions   ONE-A-DAY WOMENS 50 PLUS PO Take 1 tablet by mouth daily.   QUEtiapine 25 MG tablet Commonly known as:  SEROQUEL Take 1 tablet (25 mg total) by mouth at bedtime.   tiZANidine 4 MG tablet Commonly known as:  ZANAFLEX Take 0.5 tablets (2 mg total) by mouth 2 (two) times daily.  What changed:  how much to take   traMADol 50 MG tablet Commonly known as:  ULTRAM Take 50 mg by mouth daily as needed for moderate pain.      Allergies  Allergen Reactions  . Sinemet [Carbidopa W-Levodopa] Nausea And Vomiting    Able to tolerate with Zofran  . Sulfa Antibiotics Other (See Comments)    Flushing and hot   Follow-up Information    Richmond CampbellKaplan, Kristen W., PA-C. Call in 1 day(s).   Specialty:  Family Medicine Why:  Please call for a post hospital appointment. Contact information: [address] [place] [address] [phone]            The results of significant diagnostics from this hospitalization (including imaging, microbiology, ancillary and laboratory) are listed below for reference.    Significant Diagnostic Studies: Dg Chest 2 View  Result Date: 11/02/2018 CLINICAL DATA:  Altered mental status. EXAM: CHEST - 2  VIEW COMPARISON:  Single-view of the chest 10/11/2018. PA and lateral chest 11/25/2017. FINDINGS: Mild prominence of the pulmonary interstitium diffusely is unchanged. No consolidative process, pneumothorax or effusion. Heart size is normal. Aortic atherosclerosis is noted. No acute or focal bony abnormality. IMPRESSION: No acute disease. Electronically Signed   By: Drusilla Kannerhomas  Dalessio M.D.   On: 11/02/2018 12:31   Ct Head Wo Contrast  Result Date: 11/02/2018 CLINICAL DATA:  Muscle weakness. Anorexia. EXAM: CT HEAD WITHOUT CONTRAST TECHNIQUE: Contiguous axial images were obtained from the base of the skull through the vertex without intravenous contrast. COMPARISON:  01/22/2018 FINDINGS: Brain: Roughly similar to prior exams there are extra-axial fluid collections which could be from chronic subdural hematoma, dural thickening, or subdural hygromas. Given the patient's age there is a relative paucity of sulci. Basilar cisterns are patent but small. Ventricular size normal. Otherwise, the brainstem, cerebellum, cerebral peduncles, thalamus, basal ganglia, basilar cisterns, and ventricular system appear within normal limits. No mass lesion or acute CVA. Vascular: Unremarkable Skull: Unremarkable Sinuses/Orbits: Unremarkable Other: No supplemental non-categorized findings. IMPRESSION: 1. No acute intracranial findings. 2. Chronic appearance of extra-axial thickening/fluid collections which could be from chronic subdural hematomas, subdural hygromas, or chronic dural thickening. Electronically Signed   By: Gaylyn RongWalter  Liebkemann M.D.   On: 11/02/2018 15:46   Mr Brain Wo Contrast  Result Date: 11/02/2018 CLINICAL DATA:  73 y/o F; weakness and declining health in the last 2 weeks. EXAM: MRI HEAD WITHOUT CONTRAST TECHNIQUE: Axial DWI, sagittal T1, axial T2, axial T2 FLAIR, axial gradient echo, axial T1 sequences were acquired. The patient was unable to continue and additional sequences were not acquired. COMPARISON:   11/02/2018 CT head FINDINGS: Brain: Motion degradation of multiple sequences. No reduced diffusion to indicate acute or early subacute infarction. No abnormal susceptibility hypointensity to indicate intracranial hemorrhage. No significant structural or signal abnormality of the brain. No mass effect, hydrocephalus, or herniation. Thin 2-3 mm extra-axial collections over the cerebral convexities bilaterally with CSF signal compatible with chronic hygroma, no associated mass effect. Vascular: Normal flow voids. Skull and upper cervical spine: Hyperostosis frontalis interna. No abnormal marrow signal. Sinuses/Orbits: Negative. Other: None. IMPRESSION: Motion degraded study. 1. No acute intracranial abnormality identified. 2. 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 No associated mass effect. Electronically Signed   By: Mitzi HansenLance  Furusawa-Stratton M.D.   On: 11/02/2018 17:26   Dg Chest Portable 1 View  Result Date: 10/11/2018 CLINICAL DATA:  Altered mental status and weakness. EXAM: PORTABLE CHEST 1 VIEW COMPARISON:  08/08/2018 FINDINGS: Heart size appears  normal. No pleural effusion. Pulmonary vascular congestion identified. No airspace consolidation. Visualized osseous structures are unremarkable. IMPRESSION: 1. Mild pulmonary vascular congestion. 2. No pneumonia identified. Electronically Signed   By: Signa Kell M.D.   On: 10/11/2018 07:13    Microbiology: Recent Results (from the past 240 hour(s))  Urine culture     Status: None   Collection Time: 11/02/18 11:54 AM  Result Value Ref Range Status   Specimen Description   Final    URINE, RANDOM Performed at [facility], [address], [place], [place] [address]    Special Requests   Final    NONE Performed at [facility], [address] [place], [place] [address]    Culture   Final    NO GROWTH Performed at [facility], [address], [place], [place] [address]    Report  Status 11/04/2018 FINAL  Final  Blood Culture (routine x 2)     Status: None (Preliminary result)   Collection Time: 11/02/18 11:59 AM  Result Value Ref Range Status   Specimen Description   Final    BLOOD RIGHT FOREARM Performed at [facility], [address] [place], [place] [address]    Special Requests   Final    BOTTLES DRAWN AEROBIC AND ANAEROBIC Blood Culture adequate volume Performed at [facility], [address], [place], [place] [address]    Culture   Final    NO GROWTH 3 DAYS Performed at [facility], [address], [place], [place] [address]    Report Status PENDING  Incomplete  Blood Culture (routine x 2)     Status: None (Preliminary result)   Collection Time: 11/02/18 12:42 PM  Result Value Ref Range Status   Specimen Description   Final    RIGHT ANTECUBITAL Performed at [facility], [address] [place], [place] [address]    Special Requests   Final    BOTTLES DRAWN AEROBIC ONLY Blood Culture results may not be optimal due to an inadequate volume of blood received in culture bottles Performed at [facility], [address], [place], [place] [address]    Culture   Final    NO GROWTH 3 DAYS Performed at [facility], [address], [place], [place] [address]    Report Status PENDING  Incomplete     Labs: Basic Metabolic Panel: Recent Labs  Lab 11/02/18 1241 11/02/18 1242 11/03/18 0537  NA 143 141 142  K 3.9 3.9 4.0  CL 107 104 112*  CO2 28  --  24  GLUCOSE 129* 128* 130*  BUN 11 9 5*  CREATININE 0.72 0.70 0.61  CALCIUM 8.4*  --  8.0*   Liver Function Tests: Recent Labs  Lab 11/02/18 1241  AST 16  ALT <5  ALKPHOS 75  BILITOT 1.0  PROT 6.5  ALBUMIN 3.3*   No results for input(s): LIPASE, AMYLASE in the last 168 hours. No results for input(s): AMMONIA in the last 168 hours. CBC: Recent Labs  Lab 11/02/18 1241 11/02/18 1242 11/03/18 0537    WBC 5.4  --  3.6*  NEUTROABS 4.1  --   --   HGB 12.0 12.2 11.5*  HCT 38.2 36.0 37.2  MCV 100.5*  --  100.5*  PLT 112*  --  92*   Cardiac Enzymes: No results for input(s): CKTOTAL, CKMB, CKMBINDEX, TROPONINI in the last 168 hours. BNP: BNP (last 3  results) No results for input(s): BNP in the last 8760 hours.  ProBNP (last 3 results) No results for input(s): PROBNP in the last 8760 hours.  CBG: No results for input(s): GLUCAP in the last 168 hours.     Signed:  Darlin Droparole N Roswell Ndiaye, MD [facility] 11/05/2018, 12:50 PM
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
## 2018-11-05 NOTE — Care Management Important Message (Signed)
Important Message  Patient Details  Name: JAZIAH HENINGER MRN: [id number] Date of Birth: 09-22-1946   Medicare Important Message Given:  Yes    [name] 11/05/2018, 11:25 AMImportant Message  Patient Details  Name: SHATARI MOORMAN MRN: [id number] Date of Birth: November 14, 1945   Medicare Important Message Given:  Yes    [name] 11/05/2018, 11:25 AM

## 2018-11-05 NOTE — Discharge Instructions (Signed)
Dehydration  Dehydration is when there is not enough fluid or water in your body. This happens when you lose more fluids than you take in. People who are age 73 or older have a higher risk of getting dehydrated. Dehydration can range from mild to very bad. It should be treated right away to keep it from getting very bad. Symptoms of mild dehydration may include:  Thirst.  Dry lips.  Slightly dry mouth.  Dry, warm skin.  Dizziness. Symptoms of moderate dehydration may include:  Very dry mouth.  Muscle cramps.  Dark pee (urine). Pee may be the color of tea.  Your body making less pee.  Your eyes making fewer tears.  Heartbeat that is uneven or faster than normal (palpitations).  Headache.  Light-headedness, especially when you stand up from sitting.  Fainting (syncope). Symptoms of very bad dehydration may include:  Changes in skin, such as: ? Cold and clammy skin. ? Blotchy (mottled) or pale skin. ? Skin that does not quickly return to normal after being lightly pinched and let go (poor skin turgor).  Changes in body fluids, such as: ? Feeling very thirsty. ? Your eyes making fewer tears. ? Not sweating when body temperature is high, such as in hot weather. ? Your body making very little pee.  Changes in vital signs, such as: ? Weak pulse. ? Pulse that is more than 100 beats a minute when you are sitting still. ? Fast breathing. ? Low blood pressure.  Other changes, such as: ? Sunken eyes. ? Cold hands and feet. ? Confusion. ? Lack of energy (lethargy). ? Trouble waking up from sleep. ? Short-term weight loss. ? Unconsciousness. Follow these instructions at home:   If told by your doctor, drink an ORS: ? Make an ORS by using instructions on the package. ? Start by drinking small amounts, about  cup (120 mL) every 5-10 minutes. ? Slowly drink more until you have had the amount that your doctor said to have.  Drink enough clear fluid to keep your pee  clear or pale yellow. If you were told to drink an ORS, finish the ORS first, then start slowly drinking clear fluids. Drink fluids such as: ? Water. Do not drink only water by itself. Doing that can make the salt (sodium) level in your body get too low (hyponatremia). ? Ice chips. ? Fruit juice that you have added water to (diluted). ? Low-calorie sports drinks.  Avoid: ? Alcohol. ? Drinks that have a lot of sugar. These include high-calorie sports drinks, fruit juice that does not have water added, and soda. ? Caffeine. ? Foods that are greasy or have a lot of fat or sugar.  Take over-the-counter and prescription medicines only as told by your doctor.  Do not take salt tablets. Doing that can make the salt level in your body get too high (hypernatremia).  Eat foods that have minerals (electrolytes). Examples include bananas, oranges, potatoes, tomatoes, and spinach.  Keep all follow-up visits as told by your doctor. This is important. Contact a doctor if:  You have belly (abdominal) pain that: ? Gets worse. ? Stays in one area (localizes).  You have a rash.  You have a stiff neck.  You get angry or annoyed more easily than normal (irritability).  You are more sleepy than normal.  You have a harder time waking up than normal.  You feel: ? Weak. ? Dizzy. ? Very thirsty. Get help right away if:  You have symptoms of very  bad dehydration.  You cannot drink fluids without throwing up (vomiting).  Your symptoms get worse with treatment.  You have a fever.  You have a very bad headache.  You are throwing up or having watery poop (diarrhea) and it: ? Gets worse. ? Does not go away.  You have diarrhea for more than 24 hours.  You have blood or something green (bile) in your throw-up.  You have blood in your poop (stool). This may cause poop to look black and tarry.  You have not peed in 6-8 hours.  You have peed (urinated) only a small amount of very dark pee  during 6-8 hours.  You pass out (faint).  Your heart rate when you are sitting still is more than 100 beats a minute.  You have trouble breathing. This information is not intended to replace advice given to you by your health care provider. Make sure you discuss any questions you have with your health care provider. Document Released: 10/04/2011 Document Revised: 05/04/2016 Document Reviewed: 12/09/2015 Elsevier Interactive Patient Education  2019 Elsevier Inc[employer]   Rehydration Rehydration is the replacement of body fluids and salts and minerals (electrolytes) that are lost during dehydration. Dehydration is when there is not enough fluid or water in the body. This happens when you lose more fluids than you take in. People who are age 61 or older have a higher risk of dehydration than younger adults. Common causes of dehydration include:  Vomiting.  Diarrhea.  Excessive sweating, such as from heat exposure or exercise.  Taking medicines that cause the body to lose excess fluid (diuretics).  Impaired kidney function.  Not drinking enough fluid.  Certain illnesses or infections.  Certain poorly controlled long-term (chronic) illnesses, such as diabetes, heart disease, and kidney disease.  Symptoms of mild dehydration may include thirst, dry lips and mouth, dry skin, and dizziness. Symptoms of severe dehydration may include increased heart rate, confusion, fainting, and not urinating. You can rehydrate by drinking certain fluids or getting fluids through an IV tube, as told by your health care provider. What are the risks? Generally, rehydration is safe. However, one problem that can happen is taking in too much fluid (overhydration). This is rare. If overhydration happens, it can cause an electrolyte imbalance, kidney failure, fluid in the lungs, or a decrease in salt (sodium) levels in the body. How to rehydrate Follow instructions from your health care provider for rehydration. The  kind of fluid you should drink and the amount you should drink depend on your condition.  If directed by your health care provider, drink an oral rehydration solution (ORS). This is a drink designed to treat dehydration that is found in pharmacies and retail stores. ? Make an ORS by following instructions on the package. ? Start by drinking small amounts, about  cup (120 mL) every 5-10 minutes. ? Slowly increase how much you drink until you have taken the amount recommended by your health care provider.  Drink enough clear fluids to keep your urine clear or pale yellow. If you were instructed to drink an ORS, finish the ORS first, then start slowly drinking other clear fluids. Drink fluids such as: ? Water. Do not drink only water. Doing that can lead to having too little sodium in your body (hyponatremia). ? Ice chips. ? Fruit juice that you have added water to (diluted juice). ? Low-calorie sports drinks.  If you are severely dehydrated, your health care provider may recommend that you receive fluids through an IV  tube in the hospital.  Do not take sodium tablets. Doing that can lead to the condition of having too much sodium in your body (hypernatremia). Eating while you rehydrate Follow instructions from your health care provider about what to eat while you rehydrate. Your health care provider may recommend that you slowly begin eating regular foods in small amounts.  Eat foods that contain a healthy balance of electrolytes, such as bananas, oranges, potatoes, tomatoes, and spinach.  Avoid foods that are greasy or contain a lot of fat or sugar.  In some cases, you may get nutrition through a feeding tube that is passed through your nose and into your stomach (nasogastric tube, or NG tube). This may be done if you have uncontrolled vomiting or diarrhea. Beverages to avoid Certain beverages may make dehydration worse. While you rehydrate, avoid:  Alcohol.  Caffeine.  Drinks that  contain a lot of sugar. These include: ? High-calorie sports drinks. ? Fruit juice that is not diluted. ? Soda.  Check nutrition labels to see how much sugar or caffeine a beverage contains. Signs of dehydration recovery You may be recovering from dehydration if:  You urinate more often than you did before you started rehydrating.  Your urine is clear or pale yellow.  Your energy level improves.  You vomit less frequently.  You have diarrhea less frequently.  Your appetite improves or returns to normal.  You feel less dizzy or less light-headed.  Your skin tone and color start to look more normal. Contact a health care provider if:  You continue to have symptoms of mild dehydration, such as: ? Thirst. ? Dry lips. ? Slightly dry mouth. ? Dry, warm skin. ? Dizziness.  You continue to vomit or have diarrhea. Get help right away if:  You have symptoms of dehydration that get worse.  You feel: ? Confused. ? Weak. ? Like you are going to faint.  You have not urinated in 6-8 hours.  You have very dark urine.  You have trouble breathing.  Your heart rate while sitting still is over 100 beats a minute.  You cannot drink fluids without vomiting.  You have vomiting or diarrhea that: ? Gets worse. ? Does not go away.  You have a fever. This information is not intended to replace advice given to you by your health care provider. Make sure you discuss any questions you have with your health care provider. Document Released: 01/07/2012 Document Revised: 05/04/2016 Document Reviewed: 12/09/2015 Elsevier Interactive Patient Education  2019 [employer].

## 2018-11-05 NOTE — Progress Notes (Signed)
CSW spoke with patients son, Lorin PicketScott, regarding disposition plans- family [facility]. CSW spoke with Ig[facility]risti [facility] and they are able to accept patient tomorrow 1/9.   Patients PASRR is currently pending.   Stacy GardnerErin Shakaria Raphael, LCSW Clinical Social Worker  [facility]  [phone]

## 2018-11-05 NOTE — NC FL2 (Signed)
[place] MEDICAID FL2 LEVEL OF CARE SCREENING TOOL     IDENTIFICATION  Patient Name: Becky Figueroa Birthdate: 1946-03-28 Sex: female Admission Date (Current Location): 11/02/2018  [facility] and [place] Number:  Producer, television/film/video and Address:  [facility],  [address] [place] [address]      Provider Number: [id number]  Attending Physician Name and Address:  Darlin Drop, DO  Relative Name and Phone Number:       Current Level of Care: Hospital Recommended Level of Care: Skilled Nursing Facility Prior Approval Number:    Date Approved/Denied:   PASRR Number:    Discharge Plan: SNF    Current Diagnoses: Patient Active Problem List   Diagnosis Date Noted  . Toxic encephalopathy 11/04/2018  . Palliative care by specialist   . Goals of care, counseling/discussion   . Pressure injury of skin 11/03/2018  . Acute metabolic encephalopathy 11/03/2018  . Dehydration 11/02/2018  . Failure to thrive in adult 11/02/2018  . 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 Parkinson's disease (HCC) 04/21/2018  . Nausea 01/20/2018  . Diarrhea 11/26/2013  . Fecal incontinence 11/26/2013  . 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 Anal fissure 03/09/2013  . Unspecified constipation 03/09/2013  . ANXIETY 08/01/2010  . GERD 08/01/2010  . DYSPEPSIA 08/01/2010  . IRRITABLE BOWEL SYNDROME 08/01/2010    Orientation RESPIRATION BLADDER Height & Weight     Self, Situation  Normal Continent Weight:   Height:     BEHAVIORAL SYMPTOMS/MOOD NEUROLOGICAL BOWEL NUTRITION STATUS      Continent Diet(Regular)  AMBULATORY STATUS COMMUNICATION OF NEEDS Skin   Limited Assist Verbally Other (Comment)(Deep Tissue injury- saccrum, wound- right abdomon and pretibial )                       Personal Care Assistance Level of Assistance  Bathing, Feeding, Dressing Bathing Assistance: Limited assistance Feeding assistance: Independent Dressing Assistance: Limited  assistance     Functional Limitations Info             SPECIAL CARE FACTORS FREQUENCY  PT (By licensed PT), OT (By licensed OT)     PT Frequency: 5 OT Frequency: 5            Contractures      Additional Factors Info  Code Status, Allergies Code Status Info: Full code  Allergies Info: SINEMET CARBIDOPA W-LEVODOPA, SULFA ANTIBIOTICS            Current Medications (11/05/2018):  This is the current hospital active medication list Current Facility-Administered Medications  Medication Dose Route Frequency Provider Last Rate Last Dose  . acetaminophen (TYLENOL) tablet 650 mg  650 mg Oral Q6H PRN Danford, [name], MD       Or  . acetaminophen (TYLENOL) suppository 650 mg  650 mg Rectal Q6H PRN Danford, [name], MD      . ALPRAZolam Prudy Feeler) tablet 0.5 mg  0.5 mg Oral TID PRN Schorr, Roma Kayser, NP   0.5 mg at 11/05/18 1051  . carbidopa-levodopa (SINEMET IR) 25-100 MG per tablet immediate release 0.5 tablet  0.5 tablet Oral TID Alberteen Sam, MD   0.5 tablet at 11/05/18 1050  . dextrose 5 % and 0.45 % NaCl with KCl 20 mEq/L infusion   Intravenous Continuous Marinda Elk, MD   Stopped at 11/04/18 2100  . enoxaparin (LOVENOX) injection 40 mg  40 mg Subcutaneous Q24H Alberteen Sam, MD   40 mg at 11/04/18  2227  . fluconazole (DIFLUCAN) tablet 100 mg  100 mg Oral Daily Marinda ElkFeliz Ortiz, Abraham, MD   100 mg at 11/05/18 1051  . ondansetron (ZOFRAN) tablet 4 mg  4 mg Oral TID Alberteen Samanford, Christopher P, MD   4 mg at 11/05/18 1051  . polyvinyl alcohol (LIQUIFILM TEARS) 1.4 % ophthalmic solution 1 drop  1 drop Both Eyes PRN Marinda ElkFeliz Ortiz, Abraham, MD   1 drop at 11/04/18 2229  . QUEtiapine (SEROQUEL) tablet 50 mg  50 mg Oral QHS Danford, Earl Liteshristopher P, MD      . tiZANidine (ZANAFLEX) tablet 2 mg  2 mg Oral BID Marinda ElkFeliz Ortiz, Abraham, MD   2 mg at 11/05/18 1051  . traMADol (ULTRAM) tablet 50 mg  50 mg Oral Q6H PRN Marinda ElkFeliz Ortiz, Abraham, MD   50 mg at 11/05/18 1456      Discharge Medications: Please see discharge summary for a list of discharge medications.  Relevant Imaging Results:  Relevant Lab Results:   Additional Information [id number]  Donnie CoffinErin M Railynn Ballo, LCSW

## 2018-11-06 ENCOUNTER — Encounter (HOSPITAL_COMMUNITY): Payer: Self-pay | Admitting: Emergency Medicine

## 2018-11-06 ENCOUNTER — Emergency Department (HOSPITAL_COMMUNITY)
Admission: EM | Admit: 2018-11-06 | Discharge: 2018-11-07 | Disposition: A | Discharge status: Home / Self Care | Payer: Medicare Other | Attending: Emergency Medicine | Admitting: Emergency Medicine

## 2018-11-06 ENCOUNTER — Other Ambulatory Visit: Payer: Self-pay

## 2018-11-06 DIAGNOSIS — G20 Parkinson's disease: Secondary | ICD-10-CM | POA: Diagnosis not present

## 2018-11-06 DIAGNOSIS — G20A1 Parkinson's disease without dyskinesia, without mention of fluctuations: Secondary | ICD-10-CM

## 2018-11-06 DIAGNOSIS — F0281 Dementia in other diseases classified elsewhere with behavioral disturbance: Secondary | ICD-10-CM

## 2018-11-06 DIAGNOSIS — Z79899 Other long term (current) drug therapy: Secondary | ICD-10-CM | POA: Insufficient documentation

## 2018-11-06 DIAGNOSIS — Z789 Other specified health status: Secondary | ICD-10-CM

## 2018-11-06 LAB — METHYLMALONIC ACID(MMA), RND URINE
Creatinine(Crt), U: 0.39 g/L (ref 0.30–3.00)
METHYLMALONIC ACID UR: 5 umol/L (ref 1.6–29.7)
MMA - NORMALIZED: 1.5 umol/mmol{creat} (ref 0.5–3.4)

## 2018-11-06 NOTE — Progress Notes (Signed)
Patient's son called back after they elected to take [facility] to [facility] due to [facility] being backed up & having a long wait time. He states that he doesn't want to leave her there & that the facility is inadequate. Advised patient that since she has been discharged she cannot come back to the floor. They could either take her home or  they could either take her to the emergency room if they are unable to safely care for her.

## 2018-11-06 NOTE — ED Provider Notes (Signed)
[facility]-EMERGENCY DEPT Provider Note: Lowella DellJ. Lane Nan Maya, MD, FACEP  CSN: [id number] MRN: [id number] ARRIVAL: 11/06/18 at 1955 ROOM: WA23/WA23   CHIEF COMPLAINT  Agitation  Level 5 Caveat: dementia HISTORY OF PRESENT ILLNESS  11/06/18 11:40 PM Fidela Juneauatricia P Null is a 73 y.o. female with Parkinson's disease and associated dementia.  She was recently hospitalized for dehydration and was discharged to [facility] yesterday evening.  According to the family the facility "smelled like a shoe" and "looked like a jail".  The patient became violently agitated and the family had to remove her from the facility, returning to the ED.  She has subsequently called significantly.  She has not had her evening medications.  She has known decubitus ulcers which were addressed during her admission.  She is accompanied by her son and her daughter who are her current caretakers.  The patient had to be removed from her home situation because allegedly her husband was not caring for her properly.   Past Medical History:  Diagnosis Date  . Anal stenosis   . Anxiety   . 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 Depression   . Fatty liver   . GERD (gastroesophageal reflux disease)   . Hiatal hernia   . History of anal fissures   . History of chronic constipation   . History of kidney stones    1973  . Hyperlipidemia   . Left ureteral calculus   . Parkinson's disease (HCC)   . Wears glasses     Past Surgical History:  Procedure Laterality Date  . ANAL FISSURECTOMY  1994   and Hemorrhoidectomy  . BOTOX INJECTION N/A 11/18/2013   Procedure: BOTOX INJECTION;  Surgeon: Hart Carwinora M Brodie, MD;  Location: [facility];  Service: Endoscopy;  Laterality: N/A;  . CHOLECYSTECTOMY OPEN  1982  . CYSTOSCOPY WITH RETROGRADE PYELOGRAM, URETEROSCOPY AND STENT PLACEMENT Left 06/06/2016   Procedure: CYSTOSCOPY WITH RETROGRADE PYELOGRAM, URETEROSCOPY AND STENT PLACEMENT;  Surgeon: Barron Alvineavid Grapey, MD;   [facility];  Service: Urology;  Laterality: Left;  . FLEXIBLE SIGMOIDOSCOPY N/A 11/18/2013   Procedure: FLEXIBLE SIGMOIDOSCOPY/ with BOTOX;  Surgeon: Hart Carwinora M Brodie, MD;  Location: [facility];  Service: Endoscopy;  Laterality: N/A;  . HOLMIUM LASER APPLICATION Left 06/06/2016   Procedure: HOLMIUM LASER APPLICATION;  Surgeon: Barron Alvineavid Grapey, MD;  [facility];  Service: Urology;  Laterality: Left;  . TONSILLECTOMY  age 73  . TOTAL ABDOMINAL HYSTERECTOMY W/ BILATERAL SALPINGOOPHORECTOMY  1984   and Appendectomy    Family History  Problem Relation Age of Onset  . Breast cancer Maternal Aunt        x 4  . Pancreatic cancer Maternal Uncle 56  . Diabetes Brother   . Colon cancer Neg Hx     Social History   Tobacco Use  . Smoking status: Never Smoker  . Smokeless tobacco: Never Used  Substance Use Topics  . Alcohol use: No  . Drug use: No    Prior to Admission medications   Medication Sig Start Date End Date Taking? Authorizing Provider  ALPRAZolam Prudy Feeler(XANAX) 0.5 MG tablet Take 1 tablet (0.5 mg total) by mouth 3 (three) times daily as needed for anxiety. 11/05/18   Darlin DropHall, Carole N, DO  carbidopa-levodopa (SINEMET IR) 25-100 MG tablet 1/2 tablet twice a day for 3 weeks, then take 1/2 tablet three times a day Patient taking differently: Take 0.5 tablets by mouth 3 (three) times daily.  09/18/18   Stephanie AcreWillis, Charles  K, MD  fluconazole (DIFLUCAN) 100 MG tablet Take 1 tablet (100 mg total) by mouth daily. 11/06/18   Darlin Drop, DO  lansoprazole (PREVACID) 30 MG capsule Take 30 mg by mouth every morning.     [provider]  Multiple Vitamins-Minerals (ONE-A-DAY WOMENS 50 PLUS PO) Take 1 tablet by mouth daily.    [provider]  ondansetron (ZOFRAN) 4 MG tablet Take 1 tablet (4 mg total) by mouth every 8 (eight) hours as needed for nausea or vomiting. Patient taking differently: Take 4 mg by mouth every 8 (eight) hours. Takes with  carbidopa-levodopa 01/16/18   Huston Foley, MD  QUEtiapine (SEROQUEL) 25 MG tablet Take 1 tablet (25 mg total) by mouth at bedtime. 10/31/18   York Spaniel, MD  tiZANidine (ZANAFLEX) 4 MG tablet Take 0.5 tablets (2 mg total) by mouth 2 (two) times daily. 11/05/18   Darlin Drop, DO  traMADol (ULTRAM) 50 MG tablet Take 50 mg by mouth daily as needed for moderate pain.     [provider]    Allergies Sinemet [carbidopa w-levodopa] and Sulfa antibiotics   REVIEW OF SYSTEMS     PHYSICAL EXAMINATION  Initial Vital Signs Blood pressure (!) 121/107, pulse (!) 102, resp. rate 20, height 5\' 6"  (1.676 m), weight 62.6 kg, SpO2 99 %.  Examination General: Well-developed, cachectic female in no acute distress; appearance consistent with age of record HENT: normocephalic; atraumatic Eyes: pupils equal, round and reactive to light Neck: supple Heart: regular rate and rhythm Lungs: clear to auscultation bilaterally Abdomen: soft; nondistended; nontender; bowel sounds present Extremities: Arthritic changes; pulses normal Neurologic: Awake, alert, rambling confused speech; moves all extremities; tremor, greater on the left Skin: Warm and dry Psychiatric: Normal mood and affect   RESULTS  Summary of this visit's results, reviewed by myself:   EKG Interpretation  Date/Time:    Ventricular Rate:    PR Interval:    QRS Duration:   QT Interval:    QTC Calculation:   R Axis:     Text Interpretation:        Laboratory Studies: No results found for this or any previous visit (from the past 24 hour(s)). Imaging Studies: No results found.  ED COURSE and MDM  Nursing notes and initial vitals signs, including pulse oximetry, reviewed.  Vitals:   11/06/18 2013  BP: (!) 121/107  Pulse: (!) 102  Resp: 20  SpO2: 99%  Weight: 62.6 kg  Height: 5\' 6"  (1.676 m)   12:29 AM The patient's family has some other options for placement.  We will observe the patient overnight and  consult social work in the morning for placement.  PROCEDURES    ED DIAGNOSES     ICD-10-CM   1. Need for extended care facility Z78.9   2. Dementia due to Parkinson's disease with behavioral disturbance [facility]) G20    F02.81        Paula Libra, MD 11/09/18 1336

## 2018-11-06 NOTE — Discharge Summary (Signed)
Discharge Summary  [name]:[id number] DOB: 02/04/1946  PCP: Richmond Campbell., PA-C  Admit date: 11/02/2018 Discharge date: 11/06/2018  Time spent: 35 minutes  Discharge delayed on 11/05/2018 due to bed placement.  Recommendations for Outpatient Follow-up:  1. Follow-up with your PCP 2. Take your medications as prescribed 3. Continue physical therapy 4. Fall precautions  Discharge Diagnoses:  Active Hospital Problems   Diagnosis Date Noted  . Dehydration 11/02/2018  . Toxic encephalopathy 11/04/2018  . Palliative care by specialist   . Goals of care, counseling/discussion   . Pressure injury of skin 11/03/2018  . Acute metabolic encephalopathy 11/03/2018  . Failure to thrive in adult 11/02/2018  . 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 Parkinson's disease (HCC) 04/21/2018    Resolved Hospital Problems  No resolved problems to display.    Discharge Condition: Stable  Diet recommendation: Resume previous diet  Vitals:   11/05/18 2249 11/06/18 0534  BP: 118/62 104/61  Pulse: 76 67  Resp: 16 17  Temp: 97.6 F (36.4 C) 97.9 F (36.6 C)  SpO2: 97% 98%    History of present illness:   Becky Figueroa is an 73 y.o. female past medical history of Parkinson's disease, dementia, essential hypertension with new progressive weakness change in mentation and decreased oral intake.  11/05/2018: Patient seen and examined at bedside.  Family present.  No acute events overnight.  She has no new complaints.  She is tolerated diet well.  Awaiting placement at SNF.  11/06/2018: Patient examined with her son at bedside.  No acute events overnight.  She has no new complaints.  On the day of discharge, the patient was hemodynamically stable.  She will need to continue physical therapy at SNF.  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 She was severely dehydrated on admission she was started on IV fluids. Dehydration has resolved. Physical therapy evaluated the patient recommended skilled nursing facility.  Acute toxic encephalopathy: Likely due to medications in the setting of dehydration. Now resolved. TSH is normal, HIV negative. B12 390, methylmalonic acid results are pending. RPR negative Xanax and tizanidine were held on admission which can contribute to acute encephalopathy in the setting of decreased oral intake. We will resume Xanax and tizanidine at a lower dose. Her family relate her mentation is improved.  Oral thrush: Tolerating her diet now change her to oral Diflucan  Dementia in Parkinson's disease [facility]): Continue Zofran and Sinemet.  Pessure injury of skin: Turn patient every 2 hours.  RN Pressure Injury Documentation: Pressure Injury 11/02/18 Deep Tissue Injury - Purple or maroon localized area of discolored intact skin or blood-filled blister due to damage of underlying soft tissue from pressure and/or shear. cloudy dark purple (Active)  11/02/18 1739  Location: Sacrum  Location Orientation: Posterior;Mid  Staging: Deep Tissue Injury - Purple or maroon localized area of discolored intact skin or blood-filled blister due to damage of underlying soft tissue from pressure and/or shear.  Wound Description (Comments): cloudy dark purple  Present on Admission: Yes     Pressure Injury 11/02/18 pink area from a fall (Active)  11/02/18 1744  Location: Elbow  Location Orientation: Left;Posterior  Staging:   Wound Description (Comments): pink area from a fall  Present on Admission:     DVT prophylaxis: lovenxo Family Communication:daughter and son Disposition  Plan/Barrier to D/C:  Discharge to skilled nursing facility when bed is available. Code Status:  Full code            Discharge Exam: BP 104/61 (BP Location: Left Arm)   Pulse 67   Temp 97.9 F (36.6 C) (Oral)   Resp 17   SpO2 98%  . General: 73 y.o. year-old female well developed well nourished in no acute distress.  Alert and oriented x3. . Cardiovascular: Regular rate and rhythm with no rubs or gallops.  No thyromegaly or JVD noted.   Marland Kitchen Respiratory: Clear to auscultation with no wheezes or rales. Good inspiratory effort. . Abdomen: Soft nontender nondistended with normal bowel sounds x4 quadrants. . Musculoskeletal: No lower extremity edema. 2/4 pulses in all 4 extremities. . Skin: No ulcerative lesions noted or rashes, . Psychiatry: Mood is appropriate for condition and setting  Discharge Instructions You were cared for by a hospitalist during your hospital stay. If you have any questions about your discharge medications or the care you received while you were in the hospital after you are discharged, you can call the unit and asked to speak with the hospitalist on call if the hospitalist that took care of you is not available. Once you are discharged, your primary care physician will handle any further medical issues. Please note that NO REFILLS for any discharge medications will be authorized once you are discharged, as it is imperative that you return to your primary care physician (or establish a relationship with a primary care physician if you do not have one) for your aftercare needs so that they can reassess your need for medications and monitor your lab values.   Allergies as of 11/06/2018      Reactions   Sinemet [carbidopa W-levodopa] Nausea And Vomiting   Able to tolerate with Zofran   Sulfa Antibiotics Other (See Comments)   Flushing and hot      Medication List    STOP taking these medications   cephALEXin 500 MG capsule Commonly known as:  KEFLEX     hydrOXYzine 25 MG tablet Commonly known as:  ATARAX/VISTARIL   traZODone 50 MG tablet Commonly known as:  DESYREL     TAKE these medications   ALPRAZolam 0.5 MG tablet Commonly known as:  XANAX Take 1 tablet (0.5 mg total) by mouth 3 (three) times daily as needed for anxiety. What changed:    when to take this  reasons to take this   carbidopa-levodopa 25-100 MG tablet Commonly known as:  SINEMET IR 1/2 tablet twice a day for 3 weeks, then take 1/2 tablet three times a day What changed:    how much to take  how to take this  when to take this  additional instructions   fluconazole 100 MG tablet Commonly known as:  DIFLUCAN Take 1 tablet (100 mg total) by mouth daily.   lansoprazole 30 MG capsule Commonly known as:  PREVACID Take 30 mg by mouth every morning.   ondansetron 4 MG tablet Commonly known as:  ZOFRAN Take 1 tablet (4 mg total) by mouth every 8 (eight) hours as needed for nausea or vomiting. What changed:    when to take this  additional instructions   ONE-A-DAY WOMENS 50 PLUS PO Take 1 tablet by mouth daily.   QUEtiapine  25 MG tablet Commonly known as:  SEROQUEL Take 1 tablet (25 mg total) by mouth at bedtime.   tiZANidine 4 MG tablet Commonly known as:  ZANAFLEX Take 0.5 tablets (2 mg total) by mouth 2 (two) times daily. What changed:  how much to take   traMADol 50 MG tablet Commonly known as:  ULTRAM Take 50 mg by mouth daily as needed for moderate pain.      Allergies  Allergen Reactions  . Sinemet [Carbidopa W-Levodopa] Nausea And Vomiting    Able to tolerate with Zofran  . Sulfa Antibiotics Other (See Comments)    Flushing and hot   Follow-up Information    Richmond CampbellKaplan, Kristen W., PA-C. Call in 1 day(s).   Specialty:  Family Medicine Why:  Please call for a post hospital appointment. Contact information: [address] [place] [address] [phone]            The results of significant diagnostics from this  hospitalization (including imaging, microbiology, ancillary and laboratory) are listed below for reference.    Significant Diagnostic Studies: Dg Chest 2 View  Result Date: 11/02/2018 CLINICAL DATA:  Altered mental status. EXAM: CHEST - 2 VIEW COMPARISON:  Single-view of the chest 10/11/2018. PA and lateral chest 11/25/2017. FINDINGS: Mild prominence of the pulmonary interstitium diffusely is unchanged. No consolidative process, pneumothorax or effusion. Heart size is normal. Aortic atherosclerosis is noted. No acute or focal bony abnormality. IMPRESSION: No acute disease. Electronically Signed   By: Drusilla Kannerhomas  Dalessio M.D.   On: 11/02/2018 12:31   Ct Head Wo Contrast  Result Date: 11/02/2018 CLINICAL DATA:  Muscle weakness. Anorexia. EXAM: CT HEAD WITHOUT CONTRAST TECHNIQUE: Contiguous axial images were obtained from the base of the skull through the vertex without intravenous contrast. COMPARISON:  01/22/2018 FINDINGS: Brain: Roughly similar to prior exams there are extra-axial fluid collections which could be from chronic subdural hematoma, dural thickening, or subdural hygromas. Given the patient's age there is a relative paucity of sulci. Basilar cisterns are patent but small. Ventricular size normal. Otherwise, the brainstem, cerebellum, cerebral peduncles, thalamus, basal ganglia, basilar cisterns, and ventricular system appear within normal limits. No mass lesion or acute CVA. Vascular: Unremarkable Skull: Unremarkable Sinuses/Orbits: Unremarkable Other: No supplemental non-categorized findings. IMPRESSION: 1. No acute intracranial findings. 2. Chronic appearance of extra-axial thickening/fluid collections which could be from chronic subdural hematomas, subdural hygromas, or chronic dural thickening. Electronically Signed   By: Gaylyn RongWalter  Liebkemann M.D.   On: 11/02/2018 15:46   Mr Brain Wo Contrast  Result Date: 11/02/2018 CLINICAL DATA:  73 y/o F; weakness and declining health in the last 2 weeks.  EXAM: MRI HEAD WITHOUT CONTRAST TECHNIQUE: Axial DWI, sagittal T1, axial T2, axial T2 FLAIR, axial gradient echo, axial T1 sequences were acquired. The patient was unable to continue and additional sequences were not acquired. COMPARISON:  11/02/2018 CT head FINDINGS: Brain: Motion degradation of multiple sequences. No reduced diffusion to indicate acute or early subacute infarction. No abnormal susceptibility hypointensity to indicate intracranial hemorrhage. No significant structural or signal abnormality of the brain. No mass effect, hydrocephalus, or herniation. Thin 2-3 mm extra-axial collections over the cerebral convexities bilaterally with CSF signal compatible with chronic hygroma, no associated mass effect. Vascular: Normal flow voids. Skull and upper cervical spine: Hyperostosis frontalis interna. No abnormal marrow signal. Sinuses/Orbits: Negative. Other: None. IMPRESSION: Motion degraded study. 1. No acute intracranial abnormality identified. 2. 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 No associated mass effect. Electronically Signed  By: Mitzi Hansen M.D.   On: 11/02/2018 17:26   Dg Chest Portable 1 View  Result Date: 10/11/2018 CLINICAL DATA:  Altered mental status and weakness. EXAM: PORTABLE CHEST 1 VIEW COMPARISON:  08/08/2018 FINDINGS: Heart size appears normal. No pleural effusion. Pulmonary vascular congestion identified. No airspace consolidation. Visualized osseous structures are unremarkable. IMPRESSION: 1. Mild pulmonary vascular congestion. 2. No pneumonia identified. Electronically Signed   By: Signa Kell M.D.   On: 10/11/2018 07:13    Microbiology: Recent Results (from the past 240 hour(s))  Urine culture     Status: None   Collection Time: 11/02/18 11:54 AM  Result Value Ref Range Status   Specimen Description   Final    URINE, RANDOM Performed at [facility], [address], [place], [place] [address]    Special  Requests   Final    NONE Performed at [facility], [address], [place], [place] [address]    Culture   Final    NO GROWTH Performed at [facility], [address] [place], [place] [address]    Report Status 11/04/2018 FINAL  Final  Blood Culture (routine x 2)     Status: None (Preliminary result)   Collection Time: 11/02/18 11:59 AM  Result Value Ref Range Status   Specimen Description   Final    BLOOD RIGHT FOREARM Performed at [facility], [address], [place], [place] [address]    Special Requests   Final    BOTTLES DRAWN AEROBIC AND ANAEROBIC Blood Culture adequate volume Performed at [facility], [address] [place], [place] [address]    Culture   Final    NO GROWTH 4 DAYS Performed at [facility], [address], [place], [place] [address]    Report Status PENDING  Incomplete  Blood Culture (routine x 2)     Status: None (Preliminary result)   Collection Time: 11/02/18 12:42 PM  Result Value Ref Range Status   Specimen Description   Final    RIGHT ANTECUBITAL Performed at [facility], [address] [place], [place] [address]    Special Requests   Final    BOTTLES DRAWN AEROBIC ONLY Blood Culture results may not be optimal due to an inadequate volume of blood received in culture bottles Performed at [facility], [address], [place], [place] [address]    Culture   Final    NO GROWTH 4 DAYS Performed at [facility], [address] [place], [place] [address]    Report Status PENDING  Incomplete     Labs: Basic Metabolic Panel: Recent Labs  Lab 11/02/18 1241 11/02/18 1242 11/03/18 0537  NA 143 141 142  K 3.9 3.9 4.0  CL 107 104 112*  CO2 28  --  24  GLUCOSE 129* 128* 130*  BUN 11 9 5*  CREATININE 0.72 0.70 0.61  CALCIUM 8.4*  --  8.0*   Liver Function Tests: Recent Labs  Lab 11/02/18 1241  AST 16  ALT <5  ALKPHOS  75  BILITOT 1.0  PROT 6.5  ALBUMIN 3.3*   No results for input(s): LIPASE, AMYLASE in the last 168 hours. No results for input(s): AMMONIA in the last 168 hours. CBC: Recent Labs  Lab 11/02/18 1241 11/02/18 1242 11/03/18 0537  WBC 5.4  --  3.6*  NEUTROABS 4.1  --   --   HGB 12.0 12.2 11.5*  HCT 38.2 36.0 37.2  MCV 100.5*  --  100.5*  PLT 112*  --  92*   Cardiac Enzymes: No results for input(s): CKTOTAL, CKMB, CKMBINDEX, TROPONINI in the last 168 hours. BNP: BNP (last 3 results) No results for input(s): BNP in the last 8760 hours.  ProBNP (last 3 results) No results for input(s): PROBNP in the last 8760 hours.  CBG: No results for input(s): GLUCAP in the last 168 hours.     Signed:  Darlin Droparole N Madora Barletta, MD [facility] 11/06/2018, 10:07 AM

## 2018-11-06 NOTE — Clinical Social Work Placement (Signed)
   Patient and family chose bed at [facility].   LCSW confirmed bed with facility.   LCSW faxed dc docs to facility.   Patient to transport by [facility].   RN report # [id number] [phone]  BKJ    CLINICAL SOCIAL WORK PLACEMENT  NOTE  Date:  11/06/2018  Patient Details  Name: Becky Figueroa MRN: [id number] Date of Birth: 06/10/1946  Clinical Social Work is seeking post-discharge placement for this patient at the Skilled  Nursing Facility level of care (*CSW will initial, date and re-position this form in  chart as items are completed):  Yes   Patient/family provided with [facility] Clinical Social Work Department's list of facilities offering this level of care within the geographic area requested by the patient (or if unable, by the patient's family).  Yes   Patient/family informed of their freedom to choose among providers that offer the needed level of care, that participate in Medicare, Medicaid or managed care program needed by the patient, have an available bed and are willing to accept the patient.  Yes   Patient/family informed of [facility]'s ownership interest in [facility] and [facility], as well as of the fact that they are under no obligation to receive care at these facilities.  PASRR submitted to EDS on 11/05/18     PASRR number received on 11/06/18     Existing PASRR number confirmed on       FL2 transmitted to all facilities in geographic area requested by pt/family on 11/05/18     FL2 transmitted to all facilities within larger geographic area on       Patient informed that his/her managed care company has contracts with or will negotiate with certain facilities, including the following:        Yes   Patient/family informed of bed offers received.  Patient chooses bed at [facility]     Physician recommends and patient chooses bed at      Patient to be transferred to [facility] on 11/06/18.  Patient to be  transferred to facility by EMS     Patient family notified on 11/06/18 of transfer.  Name of family member notified:  Scott     PHYSICIAN       Additional Comment:    _______________________________________________ Coralyn Helling, LCSW 11/06/2018, 2:04 PM

## 2018-11-06 NOTE — ED Triage Notes (Signed)
Patient presents after being discharged from the floor about 1 hour ago. Patient arrived to [facility] with family and were told that they did not have any of the patient's medications, records or paper work. Family states the facility smelled horrible and looked like a jail. Per family patient became increasingly agitated and has been unable to take her medications tonight. Patient noted to be agitated on arrival. Family and patient both tearful at this time.

## 2018-11-06 NOTE — Progress Notes (Signed)
Attempted to call report 2x's to [facility] [phone], secretary kept transferring to hall but no one answered.

## 2018-11-07 LAB — CULTURE, BLOOD (ROUTINE X 2)
Culture: NO GROWTH
Culture: NO GROWTH
Special Requests: ADEQUATE

## 2018-11-07 MED ORDER — QUETIAPINE FUMARATE 25 MG PO TABS
25.0000 mg | ORAL_TABLET | Freq: Every day | ORAL | Status: DC
  Administered 2018-11-07: 25 mg via ORAL
  Filled 2018-11-07: qty 1

## 2018-11-07 MED ORDER — ALPRAZOLAM 0.5 MG PO TABS: 0.5000 mg | ORAL_TABLET | Freq: Three times a day (TID) | ORAL | 0 refills | Status: DC | PRN

## 2018-11-07 MED ORDER — FLUCONAZOLE 100 MG PO TABS: 100.0000 mg | ORAL_TABLET | Freq: Every day | ORAL | Status: DC

## 2018-11-07 MED ORDER — ALPRAZOLAM 0.5 MG PO TABS
0.5000 mg | ORAL_TABLET | Freq: Three times a day (TID) | ORAL | Status: DC | PRN
  Administered 2018-11-07: 0.5 mg via ORAL
  Filled 2018-11-07: qty 1

## 2018-11-07 MED ORDER — ADULT MULTIVITAMIN W/MINERALS CH
1.0000 | ORAL_TABLET | Freq: Every day | ORAL | Status: DC
  Administered 2018-11-07: 1 via ORAL
  Filled 2018-11-07: qty 1

## 2018-11-07 MED ORDER — PANTOPRAZOLE SODIUM 40 MG PO TBEC
40.0000 mg | DELAYED_RELEASE_TABLET | Freq: Every day | ORAL | Status: DC
  Administered 2018-11-07: 40 mg via ORAL
  Filled 2018-11-07 (×2): qty 1

## 2018-11-07 MED ORDER — TIZANIDINE HCL 4 MG PO TABS
2.0000 mg | ORAL_TABLET | Freq: Two times a day (BID) | ORAL | Status: DC
  Administered 2018-11-07 (×2): 2 mg via ORAL
  Filled 2018-11-07 (×3): qty 1

## 2018-11-07 MED ORDER — ONDANSETRON HCL 4 MG PO TABS
4.0000 mg | ORAL_TABLET | Freq: Three times a day (TID) | ORAL | Status: DC | PRN
  Administered 2018-11-07: 4 mg via ORAL
  Filled 2018-11-07: qty 1

## 2018-11-07 MED ORDER — ENSURE ENLIVE PO LIQD
237.0000 mL | Freq: Two times a day (BID) | ORAL | Status: DC
  Administered 2018-11-07: 237 mL via ORAL
  Filled 2018-11-07 (×2): qty 237

## 2018-11-07 MED ORDER — PANTOPRAZOLE SODIUM 40 MG PO PACK
40.0000 mg | PACK | Freq: Every day | ORAL | Status: DC
  Administered 2018-11-07: 40 mg
  Filled 2018-11-07: qty 20

## 2018-11-07 MED ORDER — CARBIDOPA-LEVODOPA 25-100 MG PO TABS
0.5000 | ORAL_TABLET | Freq: Three times a day (TID) | ORAL | Status: DC
  Administered 2018-11-07 (×2): 0.5 via ORAL
  Filled 2018-11-07 (×4): qty 0.5

## 2018-11-07 MED ORDER — TRAMADOL HCL 50 MG PO TABS
50.0000 mg | ORAL_TABLET | Freq: Every day | ORAL | Status: DC | PRN
  Administered 2018-11-07: 50 mg via ORAL
  Filled 2018-11-07: qty 1

## 2018-11-07 MED ORDER — TIZANIDINE HCL 4 MG PO TABS: 2.0000 mg | ORAL_TABLET | Freq: Two times a day (BID) | ORAL | 0 refills | Status: DC

## 2018-11-07 NOTE — ED Notes (Signed)
[facility] has been contacted for transported.

## 2018-11-07 NOTE — ED Provider Notes (Signed)
Pt accepted at [facility] and family is there in person and are ok with pt going there.   Gwyneth Sprout, MD 11/07/18 1244

## 2018-11-07 NOTE — ED Notes (Signed)
[facility] has arrived to transport patient  

## 2018-11-07 NOTE — ED Notes (Signed)
Patient has refused to swallow oral medication. The patient is unable to follow commands.

## 2018-11-07 NOTE — ED Notes (Signed)
Awaiting for facility placement, then this patient will be discharged.

## 2018-11-07 NOTE — Progress Notes (Addendum)
CSW aware of consult for placement assistance. Per notes, patient [facility]. Per notes, when patient got to the facility family felt the facility was inadequate. Per notes, patient became aggressive so [facility] CSW to follow up with patient and patient's family regarding discharge plan.  8:26am- CSW spoke with patient's son and patient's daughter at bedside. Patient's son confirmed that they took [facility] to [facility] last night and were displeased with the facility so they brought her back here. Per patient's son, he has not seen patient behave like this before and believes it's because she did not receive her evening medicine. CSW aware patient had bed offers other than [facility]. CSW has reached out to admissions with [facility] and [facility] who stated they would review patient's information again. CSW to await return call on if they can accept patient or not. CSW spoke with admissions at [facility] and [facility] who report they are full. CSW advised patient's family to tour facilities that they received bed offers from yesterday.   9:36am- CSW received phone call from patient's family that they were at [facility] and would like patient to go there. CSW informed patient's family that patient has not been accepted to [facility] and that they were full for today. Patient family expressed disappointment and asked CSW to see if there was a reason to keep patient overnight so that she could go to [facility]. CSW explained patient was medically cleared yesterday so likely would not be admitted again but followed up with EDP who confirmed she would not be admitted. CSW informed family that patient would be discharged once facility was found and that she would likely not be held overnight. Patient's family expressed understanding and are touring other facilities.  CSW aware patient has been declined from [facility] due  to patient's behaviors. CSW has updated family. CSW still awaiting return call from [facility].  10:24am- CSW received phone call from Velna HatchetSheila in admissions at [facility] requesting more information on patient's medications and history. CSW to provide information to [facility]. CSW also received call from patient's family who reports [facility] has advised to reach out to their sister facility, [facility]. CSW spoke with Fritzi MandesKirsten, in admissions at [facility], who is not able to tell CSW if they will take patient but provided contact information for [facility] [phone]. CSW has sent over referral and has been informed that [facility] is reviewing it. CSW awaiting return call to see if facility can accept patient.  Archie BalboaMackenzie Irwin, LCSWA  Clinical Social Work Department  [facility]  [phone]

## 2018-11-07 NOTE — Progress Notes (Signed)
Patient has been [facility] for [facility] 1/10. EDP agreeable to discharge plan. CSW briefly spoke with patient's son and patient's daughter regarding discharge plans and they are both agreeable. Please call report to [phone]. [facility] to be called for transportation.    Archie BalboaMackenzie Irwin, LCSWA  Clinical Social Work Department  [facility]  [phone]

## 2018-11-19 ENCOUNTER — Encounter (HOSPITAL_BASED_OUTPATIENT_CLINIC_OR_DEPARTMENT_OTHER): Payer: Medicare Other | Attending: Internal Medicine

## 2018-11-19 DIAGNOSIS — L89154 Pressure ulcer of sacral region, stage 4: Secondary | ICD-10-CM | POA: Insufficient documentation

## 2018-11-19 DIAGNOSIS — F0390 Unspecified dementia without behavioral disturbance: Secondary | ICD-10-CM | POA: Diagnosis not present

## 2018-11-19 DIAGNOSIS — G20 Parkinson's disease: Secondary | ICD-10-CM | POA: Insufficient documentation

## 2018-11-20 ENCOUNTER — Other Ambulatory Visit (HOSPITAL_COMMUNITY)
Admission: RE | Admit: 2018-11-20 | Discharge: 2018-11-20 | Disposition: A | Discharge status: Home / Self Care | Payer: Medicare Other | Source: Other Acute Inpatient Hospital | Attending: Internal Medicine | Admitting: Internal Medicine

## 2018-11-20 DIAGNOSIS — L89159 Pressure ulcer of sacral region, unspecified stage: Secondary | ICD-10-CM | POA: Insufficient documentation

## 2018-11-22 ENCOUNTER — Other Ambulatory Visit: Payer: Self-pay | Admitting: Neurology

## 2018-11-25 LAB — AEROBIC/ANAEROBIC CULTURE W GRAM STAIN (SURGICAL/DEEP WOUND)

## 2018-11-26 ENCOUNTER — Inpatient Hospital Stay (HOSPITAL_COMMUNITY)
Admission: EM | Admit: 2018-11-26 | Discharge: 2018-12-16 | DRG: 853 | Disposition: A | Discharge status: Skilled Nursing Facility | Payer: Medicare Other | Source: Ambulatory Visit | Attending: Internal Medicine | Admitting: Internal Medicine

## 2018-11-26 ENCOUNTER — Inpatient Hospital Stay (HOSPITAL_COMMUNITY): Payer: Medicare Other

## 2018-11-26 ENCOUNTER — Emergency Department (HOSPITAL_COMMUNITY): Payer: Medicare Other

## 2018-11-26 ENCOUNTER — Encounter (HOSPITAL_COMMUNITY): Payer: Self-pay

## 2018-11-26 ENCOUNTER — Other Ambulatory Visit: Payer: Self-pay

## 2018-11-26 DIAGNOSIS — L89150 Pressure ulcer of sacral region, unstageable: Secondary | ICD-10-CM | POA: Diagnosis not present

## 2018-11-26 DIAGNOSIS — L89154 Pressure ulcer of sacral region, stage 4: Secondary | ICD-10-CM | POA: Diagnosis present

## 2018-11-26 DIAGNOSIS — E871 Hypo-osmolality and hyponatremia: Secondary | ICD-10-CM | POA: Diagnosis present

## 2018-11-26 DIAGNOSIS — I82401 Acute embolism and thrombosis of unspecified deep veins of right lower extremity: Secondary | ICD-10-CM | POA: Diagnosis present

## 2018-11-26 DIAGNOSIS — Z888 Allergy status to other drugs, medicaments and biological substances status: Secondary | ICD-10-CM

## 2018-11-26 DIAGNOSIS — N179 Acute kidney failure, unspecified: Secondary | ICD-10-CM | POA: Diagnosis present

## 2018-11-26 DIAGNOSIS — D696 Thrombocytopenia, unspecified: Secondary | ICD-10-CM | POA: Diagnosis not present

## 2018-11-26 DIAGNOSIS — K5909 Other constipation: Secondary | ICD-10-CM | POA: Diagnosis present

## 2018-11-26 DIAGNOSIS — G92 Toxic encephalopathy: Secondary | ICD-10-CM | POA: Diagnosis present

## 2018-11-26 DIAGNOSIS — Z95828 Presence of other vascular implants and grafts: Secondary | ICD-10-CM | POA: Diagnosis not present

## 2018-11-26 DIAGNOSIS — F0280 Dementia in other diseases classified elsewhere without behavioral disturbance: Secondary | ICD-10-CM | POA: Diagnosis present

## 2018-11-26 DIAGNOSIS — I96 Gangrene, not elsewhere classified: Secondary | ICD-10-CM | POA: Diagnosis not present

## 2018-11-26 DIAGNOSIS — Z800 Family history of malignant neoplasm of digestive organs: Secondary | ICD-10-CM

## 2018-11-26 DIAGNOSIS — I959 Hypotension, unspecified: Secondary | ICD-10-CM | POA: Diagnosis not present

## 2018-11-26 DIAGNOSIS — Z91410 Personal history of adult physical and sexual abuse: Secondary | ICD-10-CM

## 2018-11-26 DIAGNOSIS — A419 Sepsis, unspecified organism: Principal | ICD-10-CM | POA: Diagnosis present

## 2018-11-26 DIAGNOSIS — G9341 Metabolic encephalopathy: Secondary | ICD-10-CM | POA: Diagnosis not present

## 2018-11-26 DIAGNOSIS — J811 Chronic pulmonary edema: Secondary | ICD-10-CM | POA: Diagnosis present

## 2018-11-26 DIAGNOSIS — Z6826 Body mass index (BMI) 26.0-26.9, adult: Secondary | ICD-10-CM

## 2018-11-26 DIAGNOSIS — F329 Major depressive disorder, single episode, unspecified: Secondary | ICD-10-CM | POA: Diagnosis present

## 2018-11-26 DIAGNOSIS — J9690 Respiratory failure, unspecified, unspecified whether with hypoxia or hypercapnia: Secondary | ICD-10-CM

## 2018-11-26 DIAGNOSIS — Z7901 Long term (current) use of anticoagulants: Secondary | ICD-10-CM

## 2018-11-26 DIAGNOSIS — Z09 Encounter for follow-up examination after completed treatment for conditions other than malignant neoplasm: Secondary | ICD-10-CM

## 2018-11-26 DIAGNOSIS — E8779 Other fluid overload: Secondary | ICD-10-CM | POA: Diagnosis not present

## 2018-11-26 DIAGNOSIS — M4628 Osteomyelitis of vertebra, sacral and sacrococcygeal region: Secondary | ICD-10-CM | POA: Diagnosis present

## 2018-11-26 DIAGNOSIS — D72829 Elevated white blood cell count, unspecified: Secondary | ICD-10-CM

## 2018-11-26 DIAGNOSIS — L8994 Pressure ulcer of unspecified site, stage 4: Secondary | ICD-10-CM | POA: Diagnosis not present

## 2018-11-26 DIAGNOSIS — R64 Cachexia: Secondary | ICD-10-CM | POA: Diagnosis present

## 2018-11-26 DIAGNOSIS — L89159 Pressure ulcer of sacral region, unspecified stage: Secondary | ICD-10-CM | POA: Diagnosis present

## 2018-11-26 DIAGNOSIS — M6088 Other myositis, other site: Secondary | ICD-10-CM | POA: Diagnosis present

## 2018-11-26 DIAGNOSIS — Z79891 Long term (current) use of opiate analgesic: Secondary | ICD-10-CM

## 2018-11-26 DIAGNOSIS — K5900 Constipation, unspecified: Secondary | ICD-10-CM | POA: Diagnosis not present

## 2018-11-26 DIAGNOSIS — R7881 Bacteremia: Secondary | ICD-10-CM

## 2018-11-26 DIAGNOSIS — E8770 Fluid overload, unspecified: Secondary | ICD-10-CM

## 2018-11-26 DIAGNOSIS — D72819 Decreased white blood cell count, unspecified: Secondary | ICD-10-CM | POA: Diagnosis not present

## 2018-11-26 DIAGNOSIS — D62 Acute posthemorrhagic anemia: Secondary | ICD-10-CM | POA: Diagnosis not present

## 2018-11-26 DIAGNOSIS — D6489 Other specified anemias: Secondary | ICD-10-CM | POA: Diagnosis present

## 2018-11-26 DIAGNOSIS — I9589 Other hypotension: Secondary | ICD-10-CM | POA: Diagnosis not present

## 2018-11-26 DIAGNOSIS — J9601 Acute respiratory failure with hypoxia: Secondary | ICD-10-CM | POA: Diagnosis present

## 2018-11-26 DIAGNOSIS — B9562 Methicillin resistant Staphylococcus aureus infection as the cause of diseases classified elsewhere: Secondary | ICD-10-CM | POA: Diagnosis not present

## 2018-11-26 DIAGNOSIS — R6521 Severe sepsis with septic shock: Secondary | ICD-10-CM | POA: Diagnosis present

## 2018-11-26 DIAGNOSIS — L89156 Pressure-induced deep tissue damage of sacral region: Secondary | ICD-10-CM | POA: Diagnosis not present

## 2018-11-26 DIAGNOSIS — Z792 Long term (current) use of antibiotics: Secondary | ICD-10-CM

## 2018-11-26 DIAGNOSIS — E440 Moderate protein-calorie malnutrition: Secondary | ICD-10-CM | POA: Diagnosis not present

## 2018-11-26 DIAGNOSIS — K219 Gastro-esophageal reflux disease without esophagitis: Secondary | ICD-10-CM | POA: Diagnosis present

## 2018-11-26 DIAGNOSIS — E669 Obesity, unspecified: Secondary | ICD-10-CM | POA: Diagnosis present

## 2018-11-26 DIAGNOSIS — R339 Retention of urine, unspecified: Secondary | ICD-10-CM | POA: Diagnosis not present

## 2018-11-26 DIAGNOSIS — K567 Ileus, unspecified: Secondary | ICD-10-CM | POA: Diagnosis present

## 2018-11-26 DIAGNOSIS — Z833 Family history of diabetes mellitus: Secondary | ICD-10-CM

## 2018-11-26 DIAGNOSIS — I491 Atrial premature depolarization: Secondary | ICD-10-CM | POA: Diagnosis present

## 2018-11-26 DIAGNOSIS — F419 Anxiety disorder, unspecified: Secondary | ICD-10-CM | POA: Diagnosis present

## 2018-11-26 DIAGNOSIS — J849 Interstitial pulmonary disease, unspecified: Secondary | ICD-10-CM | POA: Diagnosis present

## 2018-11-26 DIAGNOSIS — B966 Bacteroides fragilis [B. fragilis] as the cause of diseases classified elsewhere: Secondary | ICD-10-CM | POA: Diagnosis not present

## 2018-11-26 DIAGNOSIS — B957 Other staphylococcus as the cause of diseases classified elsewhere: Secondary | ICD-10-CM

## 2018-11-26 DIAGNOSIS — Z881 Allergy status to other antibiotic agents status: Secondary | ICD-10-CM | POA: Diagnosis not present

## 2018-11-26 DIAGNOSIS — G20 Parkinson's disease: Secondary | ICD-10-CM | POA: Diagnosis present

## 2018-11-26 DIAGNOSIS — E46 Unspecified protein-calorie malnutrition: Secondary | ICD-10-CM | POA: Diagnosis not present

## 2018-11-26 DIAGNOSIS — Z882 Allergy status to sulfonamides status: Secondary | ICD-10-CM

## 2018-11-26 DIAGNOSIS — J810 Acute pulmonary edema: Secondary | ICD-10-CM | POA: Diagnosis not present

## 2018-11-26 DIAGNOSIS — Z79899 Other long term (current) drug therapy: Secondary | ICD-10-CM

## 2018-11-26 DIAGNOSIS — G9340 Encephalopathy, unspecified: Secondary | ICD-10-CM | POA: Diagnosis not present

## 2018-11-26 DIAGNOSIS — E873 Alkalosis: Secondary | ICD-10-CM | POA: Diagnosis not present

## 2018-11-26 DIAGNOSIS — Z91412 Personal history of adult neglect: Secondary | ICD-10-CM

## 2018-11-26 DIAGNOSIS — K760 Fatty (change of) liver, not elsewhere classified: Secondary | ICD-10-CM | POA: Diagnosis present

## 2018-11-26 DIAGNOSIS — Z86718 Personal history of other venous thrombosis and embolism: Secondary | ICD-10-CM | POA: Diagnosis not present

## 2018-11-26 DIAGNOSIS — B9620 Unspecified Escherichia coli [E. coli] as the cause of diseases classified elsewhere: Secondary | ICD-10-CM | POA: Diagnosis not present

## 2018-11-26 DIAGNOSIS — Z6828 Body mass index (BMI) 28.0-28.9, adult: Secondary | ICD-10-CM | POA: Diagnosis not present

## 2018-11-26 DIAGNOSIS — G20A1 Parkinson's disease without dyskinesia, without mention of fluctuations: Secondary | ICD-10-CM | POA: Diagnosis present

## 2018-11-26 DIAGNOSIS — R9431 Abnormal electrocardiogram [ECG] [EKG]: Secondary | ICD-10-CM | POA: Diagnosis not present

## 2018-11-26 DIAGNOSIS — R0902 Hypoxemia: Secondary | ICD-10-CM

## 2018-11-26 DIAGNOSIS — K449 Diaphragmatic hernia without obstruction or gangrene: Secondary | ICD-10-CM | POA: Diagnosis present

## 2018-11-26 DIAGNOSIS — E876 Hypokalemia: Secondary | ICD-10-CM | POA: Diagnosis not present

## 2018-11-26 HISTORY — DX: Pressure ulcer of unspecified site, unspecified stage: L89.90

## 2018-11-26 LAB — CBC WITH DIFFERENTIAL/PLATELET
Abs Immature Granulocytes: 0.22 10*3/uL — ABNORMAL HIGH (ref 0.00–0.07)
Basophils Absolute: 0 10*3/uL (ref 0.0–0.1)
Basophils Relative: 0 %
Eosinophils Absolute: 0 10*3/uL (ref 0.0–0.5)
Eosinophils Relative: 0 %
HCT: 33.3 % — ABNORMAL LOW (ref 36.0–46.0)
Hemoglobin: 10.9 g/dL — ABNORMAL LOW (ref 12.0–15.0)
Immature Granulocytes: 2 %
Lymphocytes Relative: 6 %
Lymphs Abs: 0.7 10*3/uL (ref 0.7–4.0)
MCH: 31.1 pg (ref 26.0–34.0)
MCHC: 32.7 g/dL (ref 30.0–36.0)
MCV: 95.1 fL (ref 80.0–100.0)
Monocytes Absolute: 0.4 10*3/uL (ref 0.1–1.0)
Monocytes Relative: 3 %
Neutro Abs: 11.8 10*3/uL — ABNORMAL HIGH (ref 1.7–7.7)
Neutrophils Relative %: 89 %
Platelets: 127 10*3/uL — ABNORMAL LOW (ref 150–400)
RBC: 3.5 MIL/uL — ABNORMAL LOW (ref 3.87–5.11)
RDW: 13.5 % (ref 11.5–15.5)
WBC: 13.1 10*3/uL — ABNORMAL HIGH (ref 4.0–10.5)
nRBC: 0.2 % (ref 0.0–0.2)

## 2018-11-26 LAB — BLOOD GAS, ARTERIAL
Acid-Base Excess: 1.5 mmol/L (ref 0.0–2.0)
Bicarbonate: 24.8 mmol/L (ref 20.0–28.0)
DRAWN BY: 270211
FIO2: 0.21
O2 Saturation: 93.7 %
Patient temperature: 98.6
pCO2 arterial: 36.1 mmHg (ref 32.0–48.0)
pH, Arterial: 7.452 — ABNORMAL HIGH (ref 7.350–7.450)
pO2, Arterial: 70.1 mmHg — ABNORMAL LOW (ref 83.0–108.0)

## 2018-11-26 LAB — COMPREHENSIVE METABOLIC PANEL
ALBUMIN: 2.6 g/dL — AB (ref 3.5–5.0)
ALT: 24 U/L (ref 0–44)
ANION GAP: 13 (ref 5–15)
AST: 37 U/L (ref 15–41)
Alkaline Phosphatase: 137 U/L — ABNORMAL HIGH (ref 38–126)
BUN: 53 mg/dL — ABNORMAL HIGH (ref 8–23)
CO2: 24 mmol/L (ref 22–32)
Calcium: 8.2 mg/dL — ABNORMAL LOW (ref 8.9–10.3)
Chloride: 94 mmol/L — ABNORMAL LOW (ref 98–111)
Creatinine, Ser: 1.68 mg/dL — ABNORMAL HIGH (ref 0.44–1.00)
GFR calc Af Amer: 35 mL/min — ABNORMAL LOW (ref >60)
GFR calc non Af Amer: 30 mL/min — ABNORMAL LOW (ref >60)
GLUCOSE: 137 mg/dL — AB (ref 70–99)
Potassium: 3.9 mmol/L (ref 3.5–5.1)
Sodium: 131 mmol/L — ABNORMAL LOW (ref 135–145)
Total Bilirubin: 1.2 mg/dL (ref 0.3–1.2)
Total Protein: 6.6 g/dL (ref 6.5–8.1)

## 2018-11-26 LAB — CBG MONITORING, ED: Glucose-Capillary: 121 mg/dL — ABNORMAL HIGH (ref 70–99)

## 2018-11-26 LAB — LACTIC ACID, PLASMA: Lactic Acid, Venous: 1 mmol/L (ref 0.5–1.9)

## 2018-11-26 LAB — APTT: aPTT: 48 seconds — ABNORMAL HIGH (ref 24–36)

## 2018-11-26 LAB — HEPARIN LEVEL (UNFRACTIONATED): Heparin Unfractionated: >2.2 IU/mL — ABNORMAL HIGH (ref 0.30–0.70)

## 2018-11-26 MED ORDER — ACETAMINOPHEN 325 MG PO TABS
650.0000 mg | ORAL_TABLET | Freq: Four times a day (QID) | ORAL | Status: DC | PRN
  Administered 2018-11-27 – 2018-12-09 (×9): 650 mg via ORAL
  Filled 2018-11-26 (×11): qty 2

## 2018-11-26 MED ORDER — ONDANSETRON HCL 4 MG PO TABS: 4.0000 mg | ORAL_TABLET | Freq: Four times a day (QID) | ORAL | Status: DC | PRN

## 2018-11-26 MED ORDER — HEPARIN (PORCINE) 25000 UT/250ML-% IV SOLN
1000.0000 [IU]/h | INTRAVENOUS | Status: AC
  Administered 2018-11-26: 1000 [IU]/h via INTRAVENOUS
  Filled 2018-11-26: qty 250

## 2018-11-26 MED ORDER — APIXABAN 5 MG PO TABS: 5.0000 mg | ORAL_TABLET | Freq: Two times a day (BID) | ORAL | Status: DC

## 2018-11-26 MED ORDER — ALPRAZOLAM 0.5 MG PO TABS
0.5000 mg | ORAL_TABLET | Freq: Two times a day (BID) | ORAL | Status: DC | PRN
  Administered 2018-11-27 – 2018-12-16 (×23): 0.5 mg via ORAL
  Filled 2018-11-26 (×23): qty 1

## 2018-11-26 MED ORDER — VANCOMYCIN HCL IN DEXTROSE 1-5 GM/200ML-% IV SOLN
1000.0000 mg | INTRAVENOUS | Status: AC
  Administered 2018-11-26: 1000 mg via INTRAVENOUS
  Filled 2018-11-26: qty 200

## 2018-11-26 MED ORDER — APIXABAN 5 MG PO TABS: 10.0000 mg | ORAL_TABLET | Freq: Two times a day (BID) | ORAL | Status: DC

## 2018-11-26 MED ORDER — ACETAMINOPHEN 650 MG RE SUPP: 650.0000 mg | Freq: Four times a day (QID) | RECTAL | Status: DC | PRN

## 2018-11-26 MED ORDER — SODIUM CHLORIDE 0.9 % IV SOLN
1.0000 g | Freq: Two times a day (BID) | INTRAVENOUS | Status: DC
  Administered 2018-11-27 – 2018-11-28 (×3): 1 g via INTRAVENOUS
  Filled 2018-11-26 (×4): qty 1

## 2018-11-26 MED ORDER — SODIUM CHLORIDE 0.9 % IV SOLN
2.0000 g | INTRAVENOUS | Status: AC
  Administered 2018-11-26: 2 g via INTRAVENOUS
  Filled 2018-11-26: qty 2

## 2018-11-26 MED ORDER — SODIUM CHLORIDE 0.9 % IV SOLN
INTRAVENOUS | Status: DC
  Administered 2018-11-26 – 2018-12-02 (×9): via INTRAVENOUS

## 2018-11-26 MED ORDER — VANCOMYCIN HCL 10 G IV SOLR
1250.0000 mg | INTRAVENOUS | Status: DC
  Administered 2018-11-28: 1250 mg via INTRAVENOUS
  Filled 2018-11-26: qty 1250

## 2018-11-26 MED ORDER — SODIUM CHLORIDE 0.9 % IV BOLUS
1000.0000 mL | Freq: Once | INTRAVENOUS | Status: AC
  Administered 2018-11-26: 1000 mL via INTRAVENOUS

## 2018-11-26 MED ORDER — CARBIDOPA-LEVODOPA 25-100 MG PO TABS
0.5000 | ORAL_TABLET | Freq: Three times a day (TID) | ORAL | Status: DC
  Administered 2018-11-26 – 2018-12-16 (×58): 0.5 via ORAL
  Filled 2018-11-26 (×6): qty 0.5
  Filled 2018-11-26: qty 1
  Filled 2018-11-26 (×7): qty 0.5
  Filled 2018-11-26: qty 1
  Filled 2018-11-26: qty 0.5
  Filled 2018-11-26: qty 1
  Filled 2018-11-26: qty 0.5
  Filled 2018-11-26: qty 1
  Filled 2018-11-26 (×3): qty 0.5
  Filled 2018-11-26: qty 1
  Filled 2018-11-26 (×4): qty 0.5
  Filled 2018-11-26 (×4): qty 1
  Filled 2018-11-26 (×6): qty 0.5
  Filled 2018-11-26: qty 1
  Filled 2018-11-26 (×3): qty 0.5
  Filled 2018-11-26: qty 1
  Filled 2018-11-26: qty 0.5
  Filled 2018-11-26: qty 1
  Filled 2018-11-26 (×2): qty 0.5
  Filled 2018-11-26: qty 1
  Filled 2018-11-26 (×2): qty 0.5
  Filled 2018-11-26: qty 1
  Filled 2018-11-26 (×12): qty 0.5

## 2018-11-26 MED ORDER — SODIUM CHLORIDE 0.9 % IV BOLUS: 500.0000 mL | Freq: Once | INTRAVENOUS | Status: DC

## 2018-11-26 MED ORDER — ONDANSETRON HCL 4 MG/2ML IJ SOLN: 4.0000 mg | Freq: Four times a day (QID) | INTRAMUSCULAR | Status: DC | PRN
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
## 2018-11-26 NOTE — ED Notes (Signed)
Bed: WA17 Expected date:  Expected time:  Means of arrival:  Comments: EMS/infected decubitus

## 2018-11-26 NOTE — Progress Notes (Signed)
ANTICOAGULATION CONSULT NOTE - Initial Consult  Pharmacy Consult for Heparin Indication: DVT  (PTA apixaban on hold)  Allergies  Allergen Reactions  . Sinemet [Carbidopa W-Levodopa] Nausea And Vomiting    Able to tolerate with Zofran  . Sulfa Antibiotics Other (See Comments)    Flushing and hot    Patient Measurements:   Heparin Dosing Weight: TBW Recent weight 62.6 kg (11/06/18), IBW 59.3 kg  Vital Signs: Temp: 98 F (36.7 C) (01/29 1140) BP: 92/59 (01/29 1530) Pulse Rate: 80 (01/29 1530)  Labs: Recent Labs    11/26/18 1232  HGB 10.9*  HCT 33.3*  PLT 127*  CREATININE 1.68*    CrCl cannot be calculated (Unknown ideal weight.).   Medical History: Past Medical History:  Diagnosis Date  . Anal stenosis   . Anxiety   . Decubitus ulcer   . 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 Depression   . Fatty liver   . GERD (gastroesophageal reflux disease)   . Hiatal hernia   . History of anal fissures   . History of chronic constipation   . History of kidney stones    1973  . Hyperlipidemia   . Left ureteral calculus   . Parkinson's disease (HCC)   . Wears glasses     Medications:  (Not in a hospital admission)  Scheduled:   Infusions:  . sodium chloride    . [START ON 11/27/2018] ceFEPime (MAXIPIME) IV    . sodium chloride    . [START ON 11/28/2018] vancomycin      Assessment: 4 yoF admitted on 1/29 with osteomyelitis of sacral decubitus ulcer with surgical consult for debridement.  Pt was recently diagnosed with RLE DVT and started on Eliquis.  Pharmacy is consulted to dose Heparin IV while PTA Eliquis for DVT is on hold.   PTA apixaban 10mg  BID x 5 days, started 1/27- last dose taken 1/29 at 0830  Today, 11/26/2018:  Baseline coags pending  CBC: Hgb decreased at 10.9, Plt 127 (chronic thrombocytopenia)  SCr 1.7, acutely elevated, with CrCl ~ 30 ml/min.  No bleeding or complications reported   Goal of Therapy:  Heparin level 0.3-0.7  units/ml aPTT 66-102 seconds Monitor platelets by anticoagulation protocol: Yes   Plan:   No heparin bolus  Start heparin IV infusion at 1000 units/hr  Start on 1/29 at 20:30, 12 hours after last apixaban dose  APTT and Heparin level 8 hours after starting  Daily heparin level and CBC  Hold heparin at 08:00 on 1/30 per [facility] prior to debridement on 1/30.  Lynann Beaver PharmD, BCPS Pager [phone] 11/26/2018 5:33 PM

## 2018-11-26 NOTE — Progress Notes (Signed)
Pharmacy Antibiotic Note  Becky Figueroa is a 73 y.o. female admitted on 11/26/2018 from wound care center with worsening decubitus ulcer, Xray concerning for osteomyelitis. Marland Kitchen  Pharmacy has been consulted for Cefepime and Vancomycin dosing.  Plan:  Cefepime 2g IV x1 dose, followed by 1g IV q12h  Vancomycin 1g IV x1 dose, followed by 1250 mg IV q48h.  Goal AUC 400-550.  Expected AUC: 498 using SCr 1.7, weight 63 kg.  Follow up renal fxn, culture results, and clinical course.  F/u ability to de-escalate antibiotics.      Temp (24hrs), Avg:98 F (36.7 C), Min:98 F (36.7 C), Max:98 F (36.7 C)  Recent Labs  Lab 11/26/18 1232  WBC 13.1*  CREATININE 1.68*    CrCl cannot be calculated (Unknown ideal weight.).    Allergies  Allergen Reactions  . Sinemet [Carbidopa W-Levodopa] Nausea And Vomiting    Able to tolerate with Zofran  . Sulfa Antibiotics Other (See Comments)    Flushing and hot    Antimicrobials this admission: PTA cipro - started 1/28 >> 1/29 1/29 Vancomycin >> 1/29 Cefepime >>   Dose adjustments this admission:  Microbiology results: 1/29 BCx:   Thank you for allowing pharmacy to be a part of this patient's care.  Lynann Beaver PharmD, BCPS Pager [phone] 11/26/2018 2:31 PM

## 2018-11-26 NOTE — ED Triage Notes (Signed)
She comes to [place] from our [facility]. They saw her and have concerns about her sacral decubitus being "infected". She arrives per [facility] in no distress.

## 2018-11-26 NOTE — Consult Note (Signed)
WOC Nurse wound consult note Reason for Consult: Sacral pressure injury Wound type:Pressure Pressure Injury POA: Yes WOC Nursing simultaneously consulted with [facility], who has seen.  Dr. Donell Beers to perform bedside debridement tomorrow.  A mattress replacement with low air loss feature has already been ordered as well as topical care using twice daily NS dampened gauze.  WOC Nursing defers to surgical services.  WOC nursing team will not follow, but will remain available to this patient, the nursing and medical teams.  Please re-consult if needed. Thanks, Ladona Mow, MSN, RN, GNP, [place]  Pager# [phone]
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
## 2018-11-26 NOTE — ED Notes (Signed)
Tomato soup given to pt.

## 2018-11-26 NOTE — ED Triage Notes (Signed)
She has a large, deep, open sacral decubitus, the majority of which is unstageable. We defer rectal temp. D/t this decubitus.

## 2018-11-27 ENCOUNTER — Other Ambulatory Visit: Payer: Self-pay

## 2018-11-27 DIAGNOSIS — N179 Acute kidney failure, unspecified: Secondary | ICD-10-CM | POA: Insufficient documentation

## 2018-11-27 LAB — COMPREHENSIVE METABOLIC PANEL
ALT: 22 U/L (ref 0–44)
AST: 47 U/L — ABNORMAL HIGH (ref 15–41)
Albumin: 2.3 g/dL — ABNORMAL LOW (ref 3.5–5.0)
Alkaline Phosphatase: 123 U/L (ref 38–126)
Anion gap: 8 (ref 5–15)
BUN: 50 mg/dL — ABNORMAL HIGH (ref 8–23)
CO2: 22 mmol/L (ref 22–32)
Calcium: 7.7 mg/dL — ABNORMAL LOW (ref 8.9–10.3)
Chloride: 100 mmol/L (ref 98–111)
Creatinine, Ser: 1.53 mg/dL — ABNORMAL HIGH (ref 0.44–1.00)
GFR calc Af Amer: 39 mL/min — ABNORMAL LOW (ref >60)
GFR calc non Af Amer: 34 mL/min — ABNORMAL LOW (ref >60)
GLUCOSE: 127 mg/dL — AB (ref 70–99)
Potassium: 4.3 mmol/L (ref 3.5–5.1)
Sodium: 130 mmol/L — ABNORMAL LOW (ref 135–145)
Total Bilirubin: 1 mg/dL (ref 0.3–1.2)
Total Protein: 6.1 g/dL — ABNORMAL LOW (ref 6.5–8.1)

## 2018-11-27 LAB — CBC
HCT: 32 % — ABNORMAL LOW (ref 36.0–46.0)
Hemoglobin: 10.2 g/dL — ABNORMAL LOW (ref 12.0–15.0)
MCH: 30.6 pg (ref 26.0–34.0)
MCHC: 31.9 g/dL (ref 30.0–36.0)
MCV: 96.1 fL (ref 80.0–100.0)
Platelets: 134 10*3/uL — ABNORMAL LOW (ref 150–400)
RBC: 3.33 MIL/uL — ABNORMAL LOW (ref 3.87–5.11)
RDW: 13.7 % (ref 11.5–15.5)
WBC: 10.1 10*3/uL (ref 4.0–10.5)
nRBC: 0 % (ref 0.0–0.2)

## 2018-11-27 LAB — URINALYSIS, ROUTINE W REFLEX MICROSCOPIC
Bilirubin Urine: NEGATIVE
Glucose, UA: NEGATIVE mg/dL
Hgb urine dipstick: NEGATIVE
Ketones, ur: NEGATIVE mg/dL
Leukocytes, UA: NEGATIVE
Nitrite: NEGATIVE
Protein, ur: NEGATIVE mg/dL
Specific Gravity, Urine: 1.017 (ref 1.005–1.030)
pH: 5 (ref 5.0–8.0)

## 2018-11-27 LAB — APTT
aPTT: 78 seconds — ABNORMAL HIGH (ref 24–36)
aPTT: 92 seconds — ABNORMAL HIGH (ref 24–36)

## 2018-11-27 LAB — MRSA PCR SCREENING: MRSA by PCR: NEGATIVE

## 2018-11-27 LAB — HEPARIN LEVEL (UNFRACTIONATED): Heparin Unfractionated: 1.82 IU/mL — ABNORMAL HIGH (ref 0.30–0.70)

## 2018-11-27 LAB — TSH: TSH: 2.186 u[IU]/mL (ref 0.350–4.500)

## 2018-11-27 MED ORDER — HEPARIN (PORCINE) 25000 UT/250ML-% IV SOLN
1000.0000 [IU]/h | INTRAVENOUS | Status: AC
  Administered 2018-11-27: 1000 [IU]/h via INTRAVENOUS

## 2018-11-27 MED ORDER — SODIUM CHLORIDE 0.9 % IV BOLUS
500.0000 mL | Freq: Once | INTRAVENOUS | Status: AC
  Administered 2018-11-27: 500 mL via INTRAVENOUS

## 2018-11-27 MED ORDER — TRAMADOL HCL 50 MG PO TABS
50.0000 mg | ORAL_TABLET | Freq: Once | ORAL | Status: AC
  Administered 2018-11-27: 50 mg via ORAL
  Filled 2018-11-27: qty 1

## 2018-11-27 MED ORDER — ORAL CARE MOUTH RINSE
15.0000 mL | Freq: Two times a day (BID) | OROMUCOSAL | Status: DC
  Administered 2018-11-28 – 2018-12-05 (×12): 15 mL via OROMUCOSAL

## 2018-11-27 MED ORDER — SODIUM CHLORIDE 0.9 % IV BOLUS
1000.0000 mL | Freq: Once | INTRAVENOUS | Status: AC
  Administered 2018-11-27: 1000 mL via INTRAVENOUS

## 2018-11-27 MED ORDER — TRAMADOL HCL 50 MG PO TABS
50.0000 mg | ORAL_TABLET | Freq: Four times a day (QID) | ORAL | Status: DC | PRN
  Administered 2018-11-27 – 2018-12-03 (×13): 50 mg via ORAL
  Filled 2018-11-27 (×13): qty 1

## 2018-11-27 NOTE — Consult Note (Signed)
WOC Nurse wound consult note Reason for Consult: Unstageable pressure injury to sacrum.  Debrided by Zola Button at bedside today. Wound bed is still nonviable, albeit much necrotic tissue was removed. This wound is similar in presentation to those skin changes at life's end (SCALE). Would consider the presence of osteomyelitis in the coccygeal area. Wound type: Pressure Pressure Injury POA: Yes Measurement: 8cm x 10cm with depth unable to be determined due to presence of nonviable tissue. Undermining from 12-5 o'clock, 10 o'clock as evidenced by photo in Will Marlyne Beards' note dated today. Wound bed: 80% nonviable, 20% red, nongranulating Drainage (amount, consistency, odor) moderate amount serosanguinous Periwound: with periwound erythema, induration and warmth.  MAroon/purple discoloration measuring approximately 3.5cm (evidence of deep tissue pressure injury) at periphery, especially dark from 1-3 o'clock. Dressing procedure/placement/frequency: Twice daily saline dressings are in place.  Recommend TID dressings moistened with 1/4 strength Dakin's (sodium hypochlorite) solution.  If you agree, please order.  Agree with involvement of ID as the presentation is consistent with deep tissue infection. Patient was taken off of Eliquis today in favor of heparin drip.  Hydrotherapy to begin tomorrow, 6x/week.    WOC Nursing to follow in conjunction with PT/hydrotherapy visits every 7-10 days.  Will plan to see again next week.  Thanks, Ladona Mow, MSN, RN, GNP, [facility] [place]  Pager# [phone]

## 2018-11-27 NOTE — Progress Notes (Signed)
Unstageable sacral wound with black eschar.Foul smell.

## 2018-11-27 NOTE — Progress Notes (Signed)
PT Cancellation Note  Patient Details Name: [name] MRN: [id number] DOB: [birth year]   Cancelled Treatment:    Reason Eval/Treat Not Completed: Medical issues which prohibited therapy Bedside debridement of sacral decubitus performed today by surgery and per note, dressing changes for today and start Hydro PT tomorrow.  Will check back tomorrow to initiate hydrotherapy.  (Pt also awaiting SDU/Inpatient bed, currently in ED)   Justin Buechner,KATHrine E 11/27/2018, 11:40 AM Zenovia Jarred, PT, DPT Acute Rehabilitation Services Office: [phone] Pager: [phone]

## 2018-11-27 NOTE — Progress Notes (Signed)
ANTICOAGULATION CONSULT NOTE - Follow Up Consult  Pharmacy Consult for Heparin Indication: DVT  (PTA apixaban on hold)  Allergies  Allergen Reactions  . Sinemet [Carbidopa W-Levodopa] Nausea And Vomiting    Able to tolerate with Zofran  . Sulfa Antibiotics Other (See Comments)    Flushing and hot    Patient Measurements: Height: 5\' 6"  (167.6 cm) Weight: 130 lb (59 kg) IBW/kg (Calculated) : 59.3 Heparin Dosing Weight:   Vital Signs: Temp: 97.5 F (36.4 C) (01/30 1042) Temp Source: Oral (01/30 1042) BP: 101/50 (01/30 1115) Pulse Rate: 85 (01/30 1115)  Labs: Recent Labs    11/26/18 1232 11/26/18 1900 11/27/18 0530  HGB 10.9*  --  10.2*  HCT 33.3*  --  32.0*  PLT 127*  --  134*  APTT  --  48* 78*  HEPARINUNFRC  --  >2.20* 1.82*  CREATININE 1.68*  --  1.53*    Estimated Creatinine Clearance: 31 mL/min (A) (by C-G formula based on SCr of 1.53 mg/dL (H)).   Medications:  Infusions:  . sodium chloride 100 mL/hr at 11/27/18 0429  . ceFEPime (MAXIPIME) IV Stopped (11/27/18 0743)  . heparin    . sodium chloride    . [START ON 11/28/2018] vancomycin      Assessment: 12 yoF admitted on 1/29 with osteomyelitis of sacral decubitus ulcer with surgical consult for debridement.  Pt was recently diagnosed with RLE DVT and started on Eliquis.  Pharmacy is consulted to dose Heparin IV while PTA Eliquis for DVT is on hold.   Today 11/27/18 12:03 PM   S/P bedside debridement of necrotic tissue only  OK to resume heparin now without bolus as only removed dead tissue per surgery  No bleeding/line issues reported  CBC stable   Goal of Therapy:  Heparin level 0.3-0.7 units/ml aPTT 66-102 seconds Monitor platelets by anticoagulation protocol: Yes   Plan:   Resume heparin infusion at 1000 units/hr without bolus.   Check aPTT 8 hours after resuming infusion  Daily HL/CBC  Hold heparin 4h prior to OR if I&D planned  Valentina Gu 11/27/2018,12:01 PM

## 2018-11-27 NOTE — Progress Notes (Signed)
CC:  Sacral decubitus  Subjective: In bed and wick in place. She is pretty uncomfortable, but much more awake than yesterday.   We did a bedside debridement.    Objective: Vital signs in last 24 hours: Temp:  [98 F (36.7 C)] 98 F (36.7 C) (01/29 1140) Pulse Rate:  [74-98] 81 (01/30 0945) Resp:  [17-26] 19 (01/30 0945) BP: (90-110)/(46-75) 97/51 (01/30 0945) SpO2:  [92 %-100 %] 98 % (01/30 0945) Weight:  [59 kg] 59 kg (01/29 2033)   NO I/O recorded Afebrile, BP down slightly since admit WBC slightly better, CMP stable, creatinine slightly better 1.68>> 1.53 Heparin 1.823 Plain abdominal film = colonic ileus Sacrum/coccyx plain film  = Large appearing sacral decubitus ulcer with findings worrisome for osteomyelitis in the distal sacrum.  Intake/Output from previous day: 01/29 0701 - 01/30 0700 In: 2088.8 [I.V.:788.8; IV Piggyback:1300] Out: 1825 [Urine:1825] Intake/Output this shift: Total I/O In: 122.7 [I.V.:22.7; IV Piggyback:100] Out: -   General appearance: alert, cooperative and very uncomfortable see picture  Yesterday   Post debridement   Lab Results:  Recent Labs    11/26/18 1232 11/27/18 0530  WBC 13.1* 10.1  HGB 10.9* 10.2*  HCT 33.3* 32.0*  PLT 127* 134*    BMET Recent Labs    11/26/18 1232 11/27/18 0530  NA 131* 130*  K 3.9 4.3  CL 94* 100  CO2 24 22  GLUCOSE 137* 127*  BUN 53* 50*  CREATININE 1.68* 1.53*  CALCIUM 8.2* 7.7*   PT/INR No results for input(s): LABPROT, INR in the last 72 hours.  Recent Labs  Lab 11/26/18 1232 11/27/18 0530  AST 37 47*  ALT 24 22  ALKPHOS 137* 123  BILITOT 1.2 1.0  PROT 6.6 6.1*  ALBUMIN 2.6* 2.3*     Lipase  No results found for: LIPASE   Medications: . carbidopa-levodopa  0.5 tablet Oral TID WC   Anti-infectives (From admission, onward)   Start     Dose/Rate Route Frequency Ordered Stop   11/28/18 1200  vancomycin (VANCOCIN) 1,250 mg in sodium chloride 0.9 % 250 mL IVPB     1,250 mg 166.7 mL/hr over 90 Minutes Intravenous Every 48 hours 11/26/18 1715     11/27/18 0600  ceFEPIme (MAXIPIME) 1 g in sodium chloride 0.9 % 100 mL IVPB     1 g 200 mL/hr over 30 Minutes Intravenous Every 12 hours 11/26/18 1715     11/26/18 1430  ceFEPIme (MAXIPIME) 2 g in sodium chloride 0.9 % 100 mL IVPB     2 g 200 mL/hr over 30 Minutes Intravenous STAT 11/26/18 1429 11/26/18 1604   11/26/18 1430  vancomycin (VANCOCIN) IVPB 1000 mg/200 mL premix     1,000 mg 200 mL/hr over 60 Minutes Intravenous STAT 11/26/18 1429 11/26/18 1621      Assessment/Plan Parkinson's disease Dementia Anxiety/depression AMS - workup per primary, obtaining CT head, CXR, Bcx, Ucx, lactic acid   Sacral decubitus ulcer - Wound with significant necrotic tissue and drainage that wound benefit from further debridement. Please hold Eliquis in case debridement is needed in the OR. While awaiting Eliquis to wear off will try to get wound cleaned up some. Will consult WOC RN. Start PT/hydrotherapy and BID wet to dry dressing changes. Will order an air mattress. Recommend ID consult for suspected osteomyelitis.  ID - maxipime/vancomycin 1/29>> day 2 VTE - SCDs, per primary, ok for IV heparin but please hold eliquis FEN - IVF, NPO Foley - none Follow up -  TBD  Procedure:   Bedside debridement of sacral decubitus  Pt was place in the left lateral position.  Site was clean with betadine swabs.  Using an #11 blade and scissors we removed a 8 x 6 x 2 cm area of dead necrotic tissue.  A fair amount of purulent drainage was drained when we opened an are in the left lateral medial portion of the wound.  See picture, applicator stick is at this site.  She did not have any real bleeding.  We went as deep as we could without using anesthesia.  We then did a wet to dry dressing to the site. The wound measure 8 x 10 x 3 cm at the end of the procedure.    Plan:    We will just do dressing changes today and then start  hydro PT tomorrow.     LOS: 1 day    Abdulkadir Emmanuel 11/27/2018 [phone]

## 2018-11-27 NOTE — ED Notes (Signed)
Pt moved from ED stretcher to ICU bed. Bed doesn't need to be changed out when patient gets to floor.
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
## 2018-11-27 NOTE — Progress Notes (Signed)
ANTICOAGULATION CONSULT NOTE - Follow Up Consult  Pharmacy Consult for Heparin Indication: DVT  (PTA apixaban on hold)  Allergies  Allergen Reactions  . Sinemet [Carbidopa W-Levodopa] Nausea And Vomiting    Able to tolerate with Zofran  . Sulfa Antibiotics Other (See Comments)    Flushing and hot   Patient Measurements: Height: 5\' 6"  (167.6 cm) Weight: 130 lb (59 kg) IBW/kg (Calculated) : 59.3 Heparin Dosing Weight:   Vital Signs: Temp: 97.6 F (36.4 C) (01/30 1354) Temp Source: Oral (01/30 1354) BP: 93/41 (01/30 1245) Pulse Rate: 76 (01/30 1245)  Labs: Recent Labs    11/26/18 1232 11/26/18 1900 11/27/18 0530  HGB 10.9*  --  10.2*  HCT 33.3*  --  32.0*  PLT 127*  --  134*  APTT  --  48* 78*  HEPARINUNFRC  --  >2.20* 1.82*  CREATININE 1.68*  --  1.53*   Estimated Creatinine Clearance: 31 mL/min (A) (by C-G formula based on SCr of 1.53 mg/dL (H)).  Medications:  Infusions:  . sodium chloride 100 mL/hr at 11/27/18 0429  . ceFEPime (MAXIPIME) IV Stopped (11/27/18 0743)  . heparin 1,000 Units/hr (11/27/18 1356)  . sodium chloride    . [START ON 11/28/2018] vancomycin      Assessment: 74 yoF admitted on 1/29 with osteomyelitis of sacral decubitus ulcer with surgical consult for debridement.  Pt was recently diagnosed with RLE DVT and started on Eliquis.  Pharmacy is consulted to dose Heparin IV while PTA Eliquis for DVT is on hold.   Today 11/27/18 2:20 PM   S/P bedside debridement of necrotic tissue only  OK to resume heparin now without bolus as only removed dead tissue per surgery  No bleeding/line issues reported  CBC stable   Goal of Therapy:  Heparin level 0.3-0.7 units/ml aPTT 66-102 seconds Monitor platelets by anticoagulation protocol: Yes   Plan:   Resume heparin infusion at 1000 units/hr without bolus,   Check aPTT 8 hours after resuming infusion  Daily HL/CBC  aPTT in am 1/31  Hold heparin 4h prior to OR if further I&D  planned  Otho Bellows PharmD Pager [phone] 11/27/2018, 2:21 PM

## 2018-11-27 NOTE — Progress Notes (Signed)
ED TO INPATIENT HANDOFF REPORT  Name/Age/Gender Becky Figueroa 73 y.o. female  Code Status    Code Status Orders  (From admission, onward)         Start     Ordered   11/26/18 2238  Full code  Continuous     11/26/18 2237        Code Status History    Date Active Date Inactive Code Status Order ID Comments User Context   11/02/2018 1618 11/06/2018 1956 Full Code [id number]  Alberteen Samanford, Christopher P, MD ED    Advance Directive Documentation     Most Recent Value  Type of Advance Directive  Healthcare Power of Attorney, Living will  Pre-existing out of facility DNR order (yellow form or pink MOST form)  -  "MOST" Form in Place?  -      Home/SNF/Other Nursing Home  Chief Complaint infection  Level of Care/Admitting Diagnosis ED Disposition    ED Disposition Condition Comment   Admit  [facility] [[id number]  Level of Care: Stepdown [14]  Admit to SDU based on following criteria: Hemodynamic compromise or significant risk of instability:  Patient requiring short term acute titration and management of vasoactive drips, and invasive monitoring (i.e., CVP and Arterial line).  Diagnosis: Sepsis [facility](HCC) [[id number]]) [[id number]]  Admitting Physician: Alba CoryEGALADO, BELKYS A [phone][[id number]]  Attending Physician: Alba CoryEGALADO, BELKYS A [phone][[id number]]  Estimated length of stay: 3 - 4 days  Certification:: I certify this patient will need inpatient services for at least 2 midnights  PT Class (Do Not Modify): Inpatient [101]  PT Acc Code (Do Not Modify): Private [1]       Medical History Past Medical History:  Diagnosis Date  . Anal stenosis   . Anxiety   . Decubitus ulcer   . 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 Depression   . Fatty liver   . GERD (gastroesophageal reflux disease)   . Hiatal hernia   . History of anal fissures   . History of chronic constipation   . History of kidney stones    1973  . Hyperlipidemia   . Left ureteral calculus   .  Parkinson's disease (HCC)   . Wears glasses     Allergies Allergies  Allergen Reactions  . Sinemet [Carbidopa W-Levodopa] Nausea And Vomiting    Able to tolerate with Zofran  . Sulfa Antibiotics Other (See Comments)    Flushing and hot    IV Location/Drains/Wounds Patient Lines/Drains/Airways Status   Active Line/Drains/Airways    Name:   Placement date:   Placement time:   Site:   Days:   Peripheral IV 11/26/18 Right Antecubital   11/26/18    1234    Antecubital   1   Peripheral IV 11/27/18 Right Wrist   11/27/18    0708    Wrist   less than 1   Ureteral Drain/Stent Left ureter 6 Fr.   06/06/16    1210    Left ureter   904   External Urinary Catheter   10/11/18    0611    -   47   Pressure Injury 11/02/18 Deep Tissue Injury - Purple or maroon localized area of discolored intact skin or blood-filled blister due to damage of underlying soft tissue from pressure and/or shear. cloudy dark purple   11/02/18    1739     25   Pressure Injury 11/02/18 pink area from a fall   11/02/18    1744  25   Wound / Incision (Open or Dehisced) Other (Comment) Abdomen Right;Upper   -    -    Abdomen      Wound / Incision (Open or Dehisced)    -    -    -      Wound / Incision (Open or Dehisced)    -    -    -      Wound / Incision (Open or Dehisced)  masd   -    -    -      Wound / Incision (Open or Dehisced) 11/02/18 Groin Anterior;Left broken skin masd   11/02/18    -    Groin   25   Wound / Incision (Open or Dehisced) 11/02/18 Other (Comment) Pretibial Left;Anterior abrasion   11/02/18    1901    Pretibial   25          Labs/Imaging Results for orders placed or performed during the hospital encounter of 11/26/18 (from the past 48 hour(s))  CBC with Differential     Status: Abnormal   Collection Time: 11/26/18 12:32 PM  Result Value Ref Range   WBC 13.1 (H) 4.0 - 10.5 K/uL   RBC 3.50 (L) 3.87 - 5.11 MIL/uL   Hemoglobin 10.9 (L) 12.0 - 15.0 g/dL   HCT 24.4 (L) 01.0 - 27.2 %   MCV 95.1  80.0 - 100.0 fL   MCH 31.1 26.0 - 34.0 pg   MCHC 32.7 30.0 - 36.0 g/dL   RDW 53.6 64.4 - 03.4 %   Platelets 127 (L) 150 - 400 K/uL    Comment: Immature Platelet Fraction may be clinically indicated, consider ordering this additional test [id number]    nRBC 0.2 0.0 - 0.2 %   Neutrophils Relative % 89 %   Neutro Abs 11.8 (H) 1.7 - 7.7 K/uL   Lymphocytes Relative 6 %   Lymphs Abs 0.7 0.7 - 4.0 K/uL   Monocytes Relative 3 %   Monocytes Absolute 0.4 0.1 - 1.0 K/uL   Eosinophils Relative 0 %   Eosinophils Absolute 0.0 0.0 - 0.5 K/uL   Basophils Relative 0 %   Basophils Absolute 0.0 0.0 - 0.1 K/uL   Immature Granulocytes 2 %   Abs Immature Granulocytes 0.22 (H) 0.00 - 0.07 K/uL    Comment: Performed at [facility], [address] [place], [place] [address]  Comprehensive metabolic panel     Status: Abnormal   Collection Time: 11/26/18 12:32 PM  Result Value Ref Range   Sodium 131 (L) 135 - 145 mmol/L   Potassium 3.9 3.5 - 5.1 mmol/L   Chloride 94 (L) 98 - 111 mmol/L   CO2 24 22 - 32 mmol/L   Glucose, Bld 137 (H) 70 - 99 mg/dL   BUN 53 (H) 8 - 23 mg/dL   Creatinine, Ser 3.32 (H) 0.44 - 1.00 mg/dL   Calcium 8.2 (L) 8.9 - 10.3 mg/dL   Total Protein 6.6 6.5 - 8.1 g/dL   Albumin 2.6 (L) 3.5 - 5.0 g/dL   AST 37 15 - 41 U/L   ALT 24 0 - 44 U/L   Alkaline Phosphatase 137 (H) 38 - 126 U/L   Total Bilirubin 1.2 0.3 - 1.2 mg/dL   GFR calc non Af Amer 30 (L) >60 mL/min   GFR calc Af Amer 35 (L) >60 mL/min   Anion gap 13 5 - 15    Comment: Performed at [facility], [address] [place], [place] [address]  CBG monitoring, ED     Status: Abnormal   Collection Time: 11/26/18  1:58 PM  Result Value Ref Range   Glucose-Capillary 121 (H) 70 - 99 mg/dL  Blood gas, arterial     Status: Abnormal   Collection Time: 11/26/18  2:30 PM  Result Value Ref Range   FIO2 0.21    pH, Arterial 7.452 (H) 7.350 - 7.450   pCO2 arterial 36.1 32.0 - 48.0 mmHg    pO2, Arterial 70.1 (L) 83.0 - 108.0 mmHg   Bicarbonate 24.8 20.0 - 28.0 mmol/L   Acid-Base Excess 1.5 0.0 - 2.0 mmol/L   O2 Saturation 93.7 %   Patient temperature 98.6    Collection site LEFT RADIAL    Drawn by [id number]    Sample type ARTERIAL DRAW    Allens test (pass/fail) PASS PASS    Comment: Performed at [facility], [address] [place], [place] [address]  Culture, blood (routine x 2)     Status: None (Preliminary result)   Collection Time: 11/26/18  3:02 PM  Result Value Ref Range   Specimen Description      BLOOD RIGHT ANTECUBITAL Performed at [facility], [address] [place], [place] [address]    Special Requests      BOTTLES DRAWN AEROBIC AND ANAEROBIC Blood Culture adequate volume Performed at [facility], [address], [place], [place] [address]    Culture      NO GROWTH < 24 HOURS Performed at [facility], [address], [place], [place] [address]    Report Status PENDING   Culture, blood (routine x 2)     Status: None (Preliminary result)   Collection Time: 11/26/18  3:02 PM  Result Value Ref Range   Specimen Description      BLOOD LEFT ANTECUBITAL Performed at [facility], [address] [place], [place] [address]    Special Requests      BOTTLES DRAWN AEROBIC AND ANAEROBIC Blood Culture adequate volume Performed at [facility], [address], [place], [place] [address]    Culture      NO GROWTH < 24 HOURS Performed at [facility], [address], [place], [place] [address]    Report Status PENDING   Lactic acid, plasma     Status: None   Collection Time: 11/26/18  3:02 PM  Result Value Ref Range   Lactic Acid, Venous 1.0 0.5 - 1.9 mmol/L    Comment: Performed at [facility], [address], [place], [place] [address]  APTT     Status: Abnormal   Collection Time: 11/26/18  7:00 PM  Result Value Ref Range    aPTT 48 (H) 24 - 36 seconds    Comment:        IF BASELINE aPTT IS ELEVATED, SUGGEST PATIENT RISK ASSESSMENT BE USED TO DETERMINE APPROPRIATE ANTICOAGULANT THERAPY. Performed at [facility], [address] [place], [place] [address]   Heparin level (unfractionated)     Status: Abnormal   Collection Time: 11/26/18  7:00 PM  Result Value Ref Range   Heparin Unfractionated >2.20 (H) 0.30 - 0.70 IU/mL    Comment: RESULTS CONFIRMED BY MANUAL DILUTION Performed at [facility], [address] [place], [place] [address]   TSH     Status: None   Collection Time: 11/26/18 10:38  PM  Result Value Ref Range   TSH 2.186 0.350 - 4.500 uIU/mL    Comment: Performed by a 3rd Generation assay with a functional sensitivity of <=0.01 uIU/mL. Performed at [facility], [address], [place], [place] [address]   Urinalysis, Routine w reflex microscopic     Status: Abnormal   Collection Time: 11/27/18  5:21 AM  Result Value Ref Range   Color, Urine AMBER (A) YELLOW    Comment: BIOCHEMICALS MAY BE AFFECTED BY COLOR   APPearance HAZY (A) CLEAR   Specific Gravity, Urine 1.017 1.005 - 1.030   pH 5.0 5.0 - 8.0   Glucose, UA NEGATIVE NEGATIVE mg/dL   Hgb urine dipstick NEGATIVE NEGATIVE   Bilirubin Urine NEGATIVE NEGATIVE   Ketones, ur NEGATIVE NEGATIVE mg/dL   Protein, ur NEGATIVE NEGATIVE mg/dL   Nitrite NEGATIVE NEGATIVE   Leukocytes, UA NEGATIVE NEGATIVE    Comment: Performed at [facility], [address], [place], [place] [address]  APTT     Status: Abnormal   Collection Time: 11/27/18  5:30 AM  Result Value Ref Range   aPTT 78 (H) 24 - 36 seconds    Comment:        IF BASELINE aPTT IS ELEVATED, SUGGEST PATIENT RISK ASSESSMENT BE USED TO DETERMINE APPROPRIATE ANTICOAGULANT THERAPY. Performed at [facility], [address] [place], [place] [address]   Heparin level (unfractionated)      Status: Abnormal   Collection Time: 11/27/18  5:30 AM  Result Value Ref Range   Heparin Unfractionated 1.82 (H) 0.30 - 0.70 IU/mL    Comment: RESULTS CONFIRMED BY MANUAL DILUTION (NOTE) If heparin results are below expected values, and patient dosage has  been confirmed, suggest follow up testing of antithrombin III levels. Performed at [facility], [address] [place] [address], [place], [place] [address]   Comprehensive metabolic panel     Status: Abnormal   Collection Time: 11/27/18  5:30 AM  Result Value Ref Range   Sodium 130 (L) 135 - 145 mmol/L   Potassium 4.3 3.5 - 5.1 mmol/L   Chloride 100 98 - 111 mmol/L   CO2 22 22 - 32 mmol/L   Glucose, Bld 127 (H) 70 - 99 mg/dL   BUN 50 (H) 8 - 23 mg/dL   Creatinine, Ser 0.31 (H) 0.44 - 1.00 mg/dL   Calcium 7.7 (L) 8.9 - 10.3 mg/dL   Total Protein 6.1 (L) 6.5 - 8.1 g/dL   Albumin 2.3 (L) 3.5 - 5.0 g/dL   AST 47 (H) 15 - 41 U/L   ALT 22 0 - 44 U/L   Alkaline Phosphatase 123 38 - 126 U/L   Total Bilirubin 1.0 0.3 - 1.2 mg/dL   GFR calc non Af Amer 34 (L) >60 mL/min   GFR calc Af Amer 39 (L) >60 mL/min   Anion gap 8 5 - 15    Comment: Performed at [facility], [address], [place], [place] [address]  CBC     Status: Abnormal   Collection Time: 11/27/18  5:30 AM  Result Value Ref Range   WBC 10.1 4.0 - 10.5 K/uL   RBC 3.33 (L) 3.87 - 5.11 MIL/uL   Hemoglobin 10.2 (L) 12.0 - 15.0 g/dL   HCT 59.2 (L) 92.4 - 46.2 %   MCV 96.1 80.0 - 100.0 fL   MCH 30.6 26.0 - 34.0 pg   MCHC 31.9 30.0 - 36.0 g/dL   RDW 13.7  11.5 - 15.5 %   Platelets 134 (L) 150 - 400 K/uL   nRBC 0.0 0.0 - 0.2 %    Comment: Performed at [facility], [address], [place], [place] [address]   Dg Sacrum/coccyx  Result Date: 11/26/2018 CLINICAL DATA:  Sacral decubitus ulcer. EXAM: SACRUM AND COCCYX - 2+ VIEW COMPARISON:  CT abdomen and pelvis 04/19/2016. FINDINGS: The inferior aspect of the sacrum appears exposed to  air. Technique is limited but there appears to be bony destructive change in the underlying distal sacrum worrisome for osteomyelitis. No radiopaque foreign body is identified. IMPRESSION: Large appearing sacral decubitus ulcer with findings worrisome for osteomyelitis in the distal sacrum. MRI of the sacrum with contrast is the best test for further evaluation. Electronically Signed   By: Drusilla Kanner M.D.   On: 11/26/2018 12:58   Dg Abd 1 View  Result Date: 11/26/2018 CLINICAL DATA:  Constipation EXAM: ABDOMEN - 1 VIEW COMPARISON:  08/08/2018 FINDINGS: Moderately dilated colon which is gas-filled. Small bowel nondilated. Surgical clips right upper quadrant. Left renal calculus unchanged. IMPRESSION: Colonic ileus Electronically Signed   By: Marlan Palau M.D.   On: 11/26/2018 16:42   Ct Head Wo Contrast  Result Date: 11/26/2018 CLINICAL DATA:  Encephalopathy EXAM: CT HEAD WITHOUT CONTRAST TECHNIQUE: Contiguous axial images were obtained from the base of the skull through the vertex without intravenous contrast. Sagittal and coronal MPR images reconstructed from axial data set. COMPARISON:  11/02/2018 Correlation: MR brain 11/02/2018. FINDINGS: Brain: Normal ventricular morphology. No midline shift or mass effect. Normal appearance of brain parenchyma. No intracranial hemorrhage, mass lesion or evidence of acute infarction. Minimal chronic prominence of the extra-axial spaces bilaterally over the convexities correspond to minimal chronic subdural collections likely hygromas, not significantly changed. No new extra-axial collections. Vascular: Unremarkable Skull: Intact though mildly demineralized. Sinuses/Orbits: Clear Other: N/A IMPRESSION: No acute intracranial abnormalities. 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 Electronically Signed   By: Ulyses Southward M.D.   On: 11/26/2018 16:23   Dg Chest Port 1 View  Result Date:  11/26/2018 CLINICAL DATA:  Leukocytosis, decubitus ulcer, shortness of breath EXAM: PORTABLE CHEST 1 VIEW COMPARISON:  11/02/2018 FINDINGS: Similar low lung volumes with chronic appearing bronchitic changes and interstitial lung disease as before. No definite superimposed pneumonia, collapse, edema, effusion or pneumothorax. Trachea midline. Aorta atherosclerotic. Degenerative changes of the spine. Bowel distension noted in the epigastric region. IMPRESSION: Similar low lung volumes and chronic interstitial lung disease. No significant interval change or superimposed acute process. Electronically Signed   By: Judie Petit.  Shick M.D.   On: 11/26/2018 16:27    Pending Labs Unresulted Labs (From admission, onward)    Start     Ordered   11/28/18 0500  CBC  Daily,   R     11/27/18 1053   11/28/18 0500  Heparin level (unfractionated)  Daily,   R     11/27/18 1208   11/28/18 0400  Basic metabolic panel  Daily,   R     11/27/18 1053          Vitals/Pain Today's Vitals   11/27/18 0945 11/27/18 1042 11/27/18 1115 11/27/18 1200  BP: (!) 97/51 (!) 91/37 (!) 101/50 (!) 87/58  Pulse: 81 86 85 78  Resp: 19 20 19  (!) 26  Temp:  (!) 97.5 F (36.4 C)    TempSrc:  Oral    SpO2: 98% 98% 98% 98%  Weight:  Height:      PainSc:        Isolation Precautions No active isolations  Medications Medications  sodium chloride 0.9 % bolus 500 mL (500 mLs Intravenous Not Given 11/26/18 1414)  0.9 %  sodium chloride infusion ( Intravenous Rate/Dose Verify 11/27/18 0429)  acetaminophen (TYLENOL) tablet 650 mg (650 mg Oral Given 11/27/18 0935)    Or  acetaminophen (TYLENOL) suppository 650 mg ( Rectal See Alternative 11/27/18 0935)  ondansetron (ZOFRAN) tablet 4 mg (has no administration in time range)    Or  ondansetron (ZOFRAN) injection 4 mg (has no administration in time range)  ceFEPIme (MAXIPIME) 1 g in sodium chloride 0.9 % 100 mL IVPB (0 g Intravenous Stopped 11/27/18 0743)  vancomycin (VANCOCIN) 1,250 mg  in sodium chloride 0.9 % 250 mL IVPB (has no administration in time range)  carbidopa-levodopa (SINEMET IR) 25-100 MG per tablet immediate release 0.5 tablet (0.5 tablets Oral Given 11/27/18 0938)  ALPRAZolam Prudy Feeler(XANAX) tablet 0.5 mg (0.5 mg Oral Given 11/27/18 0946)  heparin ADULT infusion 100 units/mL (25000 units/27450mL sodium chloride 0.45%) (0 Units/hr Intravenous Stopped 11/27/18 0800)  traMADol (ULTRAM) tablet 50 mg (50 mg Oral Given 11/27/18 1041)  heparin ADULT infusion 100 units/mL (25000 units/26650mL sodium chloride 0.45%) (has no administration in time range)  sodium chloride 0.9 % bolus 1,000 mL (0 mLs Intravenous Stopped 11/26/18 1505)  ceFEPIme (MAXIPIME) 2 g in sodium chloride 0.9 % 100 mL IVPB (0 g Intravenous Stopped 11/26/18 1604)  vancomycin (VANCOCIN) IVPB 1000 mg/200 mL premix (0 mg Intravenous Stopped 11/26/18 1621)  traMADol (ULTRAM) tablet 50 mg (50 mg Oral Given 11/27/18 0701)    Mobility non-ambulatory

## 2018-11-27 NOTE — Progress Notes (Signed)
ANTICOAGULATION CONSULT NOTE - Follow Up Consult  Pharmacy Consult for Heparin Indication: DVT  (PTA apixaban on hold)  Allergies  Allergen Reactions  . Sinemet [Carbidopa W-Levodopa] Nausea And Vomiting    Able to tolerate with Zofran  . Sulfa Antibiotics Other (See Comments)    Flushing and hot    Patient Measurements: Height: 5\' 6"  (167.6 cm) Weight: 130 lb (59 kg) IBW/kg (Calculated) : 59.3 Heparin Dosing Weight:   Vital Signs: BP: 97/58 (01/30 0547) Pulse Rate: 84 (01/30 0547)  Labs: Recent Labs    11/26/18 1232 11/26/18 1900 11/27/18 0530  HGB 10.9*  --  10.2*  HCT 33.3*  --  32.0*  PLT 127*  --  134*  APTT  --  48* 78*  HEPARINUNFRC  --  >2.20* 1.82*  CREATININE 1.68*  --  1.53*    Estimated Creatinine Clearance: 31 mL/min (A) (by C-G formula based on SCr of 1.53 mg/dL (H)).   Medications:  Infusions:  . sodium chloride 100 mL/hr at 11/27/18 0429  . ceFEPime (MAXIPIME) IV    . heparin 1,000 Units/hr (11/27/18 0541)  . sodium chloride    . [START ON 11/28/2018] vancomycin      Assessment: Patient with heparin at goal PTT.  No heparin issues per RN.  Heparin to be turned off at 0800.  RN said will past on to the next shift.  Goal of Therapy:  Heparin level 0.3-0.7 units/ml aPTT 66-102 seconds Monitor platelets by anticoagulation protocol: Yes   Plan:  Continue heparin drip at current rate Heparin to be turned off at 0800  Darlina Guys, [place] 11/27/2018,6:22 AM

## 2018-11-28 ENCOUNTER — Inpatient Hospital Stay (HOSPITAL_COMMUNITY): Payer: Medicare Other

## 2018-11-28 ENCOUNTER — Encounter (HOSPITAL_COMMUNITY): Payer: Self-pay

## 2018-11-28 DIAGNOSIS — E46 Unspecified protein-calorie malnutrition: Secondary | ICD-10-CM

## 2018-11-28 DIAGNOSIS — L89150 Pressure ulcer of sacral region, unstageable: Secondary | ICD-10-CM

## 2018-11-28 DIAGNOSIS — G20 Parkinson's disease: Secondary | ICD-10-CM

## 2018-11-28 DIAGNOSIS — F0280 Dementia in other diseases classified elsewhere without behavioral disturbance: Secondary | ICD-10-CM

## 2018-11-28 DIAGNOSIS — Z79899 Other long term (current) drug therapy: Secondary | ICD-10-CM

## 2018-11-28 DIAGNOSIS — Z86718 Personal history of other venous thrombosis and embolism: Secondary | ICD-10-CM

## 2018-11-28 DIAGNOSIS — Z881 Allergy status to other antibiotic agents status: Secondary | ICD-10-CM

## 2018-11-28 DIAGNOSIS — Z888 Allergy status to other drugs, medicaments and biological substances status: Secondary | ICD-10-CM

## 2018-11-28 LAB — BASIC METABOLIC PANEL
Anion gap: 4 — ABNORMAL LOW (ref 5–15)
BUN: 27 mg/dL — ABNORMAL HIGH (ref 8–23)
CO2: 19 mmol/L — AB (ref 22–32)
Calcium: 6.7 mg/dL — ABNORMAL LOW (ref 8.9–10.3)
Chloride: 113 mmol/L — ABNORMAL HIGH (ref 98–111)
Creatinine, Ser: 0.86 mg/dL (ref 0.44–1.00)
GFR calc Af Amer: >60 mL/min (ref >60)
GLUCOSE: 101 mg/dL — AB (ref 70–99)
Potassium: 3.4 mmol/L — ABNORMAL LOW (ref 3.5–5.1)
Sodium: 136 mmol/L (ref 135–145)

## 2018-11-28 LAB — TSH: TSH: 2.114 u[IU]/mL (ref 0.350–4.500)

## 2018-11-28 LAB — CBC
HEMATOCRIT: 28.1 % — AB (ref 36.0–46.0)
Hemoglobin: 8.8 g/dL — ABNORMAL LOW (ref 12.0–15.0)
MCH: 31.3 pg (ref 26.0–34.0)
MCHC: 31.3 g/dL (ref 30.0–36.0)
MCV: 100 fL (ref 80.0–100.0)
Platelets: 125 10*3/uL — ABNORMAL LOW (ref 150–400)
RBC: 2.81 MIL/uL — ABNORMAL LOW (ref 3.87–5.11)
RDW: 13.6 % (ref 11.5–15.5)
WBC: 5.8 10*3/uL (ref 4.0–10.5)
nRBC: 0 % (ref 0.0–0.2)

## 2018-11-28 LAB — HEPARIN LEVEL (UNFRACTIONATED)
Heparin Unfractionated: 0.62 IU/mL (ref 0.30–0.70)
Heparin Unfractionated: 0.36 IU/mL (ref 0.30–0.70)

## 2018-11-28 LAB — APTT
aPTT: 124 seconds — ABNORMAL HIGH (ref 24–36)
aPTT: 114 seconds — ABNORMAL HIGH (ref 24–36)

## 2018-11-28 MED ORDER — POTASSIUM CHLORIDE CRYS ER 20 MEQ PO TBCR
40.0000 meq | EXTENDED_RELEASE_TABLET | Freq: Once | ORAL | Status: AC
  Administered 2018-11-28: 40 meq via ORAL
  Filled 2018-11-28: qty 2

## 2018-11-28 MED ORDER — ENSURE ENLIVE PO LIQD
237.0000 mL | Freq: Two times a day (BID) | ORAL | Status: DC
  Administered 2018-11-28 – 2018-12-16 (×23): 237 mL via ORAL

## 2018-11-28 MED ORDER — SODIUM CHLORIDE 0.9 % IV BOLUS
1000.0000 mL | Freq: Once | INTRAVENOUS | Status: AC
  Administered 2018-11-28: 1000 mL via INTRAVENOUS

## 2018-11-28 MED ORDER — MAGNESIUM SULFATE 2 GM/50ML IV SOLN
2.0000 g | Freq: Once | INTRAVENOUS | Status: AC
  Administered 2018-11-28: 2 g via INTRAVENOUS
  Filled 2018-11-28: qty 50

## 2018-11-28 MED ORDER — SODIUM CHLORIDE 0.9 % IV BOLUS
500.0000 mL | Freq: Once | INTRAVENOUS | Status: AC
  Administered 2018-11-28: 500 mL via INTRAVENOUS

## 2018-11-28 MED ORDER — HEPARIN (PORCINE) 25000 UT/250ML-% IV SOLN
1150.0000 [IU]/h | INTRAVENOUS | Status: DC
  Administered 2018-11-28 – 2018-11-30 (×2): 1000 [IU]/h via INTRAVENOUS
  Filled 2018-11-28 (×3): qty 250

## 2018-11-28 MED ORDER — SODIUM CHLORIDE 0.9 % IV SOLN
2.0000 g | Freq: Two times a day (BID) | INTRAVENOUS | Status: DC
  Administered 2018-11-28 – 2018-11-30 (×4): 2 g via INTRAVENOUS
  Filled 2018-11-28 (×4): qty 2

## 2018-11-28 MED ORDER — ZOLPIDEM TARTRATE 5 MG PO TABS
5.0000 mg | ORAL_TABLET | Freq: Every evening | ORAL | Status: DC | PRN
  Administered 2018-11-28: 5 mg via ORAL
  Filled 2018-11-28: qty 1

## 2018-11-28 MED ORDER — GADOBUTROL 1 MMOL/ML IV SOLN
6.0000 mL | Freq: Once | INTRAVENOUS | Status: AC | PRN
  Administered 2018-11-28: 6 mL via INTRAVENOUS

## 2018-11-28 MED ORDER — VANCOMYCIN HCL IN DEXTROSE 1-5 GM/200ML-% IV SOLN: 1000.0000 mg | INTRAVENOUS | Status: DC

## 2018-11-28 NOTE — Progress Notes (Signed)
[facility]/Trauma Progress Note      Assessment/Plan Parkinson's disease Dementia Anxiety/depression AMS - workup per primary, obtaining CT head, CXR, Bcx, Ucx, lactic acid  Sacral decubitus ulcer - Wound with significant necrotic tissue and drainage that wound benefit from further debridement.  - hold Eliquis for possible OR - continue  PT/hydrotherapy and BID wet to dry dressing changes.  - Recommend ID consult for suspected osteomyelitis  ID -maxipime/vancomycin 1/29>>  VTE -SCDs, per primary, ok for IV heparin but please hold eliquis Follow up -TBD  Dispo: pt will likely need OR debridement, will discuss with MD   Subjective: CC: sacral wound  Not much pain. Undergoing hydrotherapy  Objective: Vital signs in last 24 hours: Temp:  [97.5 F (36.4 C)-98.4 F (36.9 C)] 98.1 F (36.7 C) (01/31 1200) Pulse Rate:  [65-85] 81 (01/31 1330) Resp:  [14-29] 23 (01/31 1330) BP: (74-117)/(32-98) 100/47 (01/31 1330) SpO2:  [85 %-100 %] 97 % (01/31 1330) Weight:  [59 kg] 59 kg (01/30 1400) Last BM Date: 11/28/18  Intake/Output from previous day: 01/30 0701 - 01/31 0700 In: 6461.6 [P.O.:120; I.V.:1723.2; IV Piggyback:4618.4] Out: -  Intake/Output this shift: Total I/O In: 2498.7 [I.V.:83.3; IV Piggyback:2415.4] Out: 2150 [Urine:2150]  PE: Gen:  Alert, NAD, pleasant, cooperative GU: sacral wound see photo below, foul odor Skin: warm and dry      Anti-infectives: Anti-infectives (From admission, onward)   Start     Dose/Rate Route Frequency Ordered Stop   11/29/18 1200  vancomycin (VANCOCIN) IVPB 1000 mg/200 mL premix     1,000 mg 200 mL/hr over 60 Minutes Intravenous Every 24 hours 11/28/18 1304     11/28/18 1800  ceFEPIme (MAXIPIME) 2 g in sodium chloride 0.9 % 100 mL IVPB     2 g 200 mL/hr over 30 Minutes Intravenous Every 12 hours 11/28/18 1304     11/28/18 1200  vancomycin (VANCOCIN) 1,250 mg in sodium chloride 0.9 % 250 mL IVPB  Status:   Discontinued     1,250 mg 166.7 mL/hr over 90 Minutes Intravenous Every 48 hours 11/26/18 1715 11/28/18 1304   11/27/18 0600  ceFEPIme (MAXIPIME) 1 g in sodium chloride 0.9 % 100 mL IVPB  Status:  Discontinued     1 g 200 mL/hr over 30 Minutes Intravenous Every 12 hours 11/26/18 1715 11/28/18 1304   11/26/18 1430  ceFEPIme (MAXIPIME) 2 g in sodium chloride 0.9 % 100 mL IVPB     2 g 200 mL/hr over 30 Minutes Intravenous STAT 11/26/18 1429 11/26/18 1604   11/26/18 1430  vancomycin (VANCOCIN) IVPB 1000 mg/200 mL premix     1,000 mg 200 mL/hr over 60 Minutes Intravenous STAT 11/26/18 1429 11/26/18 1621      Lab Results:  Recent Labs    11/27/18 0530 11/28/18 0243  WBC 10.1 5.8  HGB 10.2* 8.8*  HCT 32.0* 28.1*  PLT 134* 125*   BMET Recent Labs    11/27/18 0530 11/28/18 0243  NA 130* 136  K 4.3 3.4*  CL 100 113*  CO2 22 19*  GLUCOSE 127* 101*  BUN 50* 27*  CREATININE 1.53* 0.86  CALCIUM 7.7* 6.7*   PT/INR No results for input(s): LABPROT, INR in the last 72 hours. CMP     Component Value Date/Time   NA 136 11/28/2018 0243   K 3.4 (L) 11/28/2018 0243   CL 113 (H) 11/28/2018 0243   CO2 19 (L) 11/28/2018 0243   GLUCOSE 101 (H) 11/28/2018 0243   BUN 27 (  H) 11/28/2018 0243   CREATININE 0.86 11/28/2018 0243   CALCIUM 6.7 (L) 11/28/2018 0243   PROT 6.1 (L) 11/27/2018 0530   ALBUMIN 2.3 (L) 11/27/2018 0530   AST 47 (H) 11/27/2018 0530   ALT 22 11/27/2018 0530   ALKPHOS 123 11/27/2018 0530   BILITOT 1.0 11/27/2018 0530   GFRNONAA >60 11/28/2018 0243   GFRAA >60 11/28/2018 0243   Lipase  No results found for: LIPASE  Studies/Results: Dg Abd 1 View  Result Date: 11/26/2018 CLINICAL DATA:  Constipation EXAM: ABDOMEN - 1 VIEW COMPARISON:  08/08/2018 FINDINGS: Moderately dilated colon which is gas-filled. Small bowel nondilated. Surgical clips right upper quadrant. Left renal calculus unchanged. IMPRESSION: Colonic ileus Electronically Signed   By: Marlan Palau M.D.    On: 11/26/2018 16:42   Ct Head Wo Contrast  Result Date: 11/26/2018 CLINICAL DATA:  Encephalopathy EXAM: CT HEAD WITHOUT CONTRAST TECHNIQUE: Contiguous axial images were obtained from the base of the skull through the vertex without intravenous contrast. Sagittal and coronal MPR images reconstructed from axial data set. COMPARISON:  11/02/2018 Correlation: MR brain 11/02/2018. FINDINGS: Brain: Normal ventricular morphology. No midline shift or mass effect. Normal appearance of brain parenchyma. No intracranial hemorrhage, mass lesion or evidence of acute infarction. Minimal chronic prominence of the extra-axial spaces bilaterally over the convexities correspond to minimal chronic subdural collections likely hygromas, not significantly changed. No new extra-axial collections. Vascular: Unremarkable Skull: Intact though mildly demineralized. Sinuses/Orbits: Clear Other: N/A IMPRESSION: No acute intracranial abnormalities. 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 Electronically Signed   By: Ulyses Southward M.D.   On: 11/26/2018 16:23   Dg Chest Port 1 View  Result Date: 11/26/2018 CLINICAL DATA:  Leukocytosis, decubitus ulcer, shortness of breath EXAM: PORTABLE CHEST 1 VIEW COMPARISON:  11/02/2018 FINDINGS: Similar low lung volumes with chronic appearing bronchitic changes and interstitial lung disease as before. No definite superimposed pneumonia, collapse, edema, effusion or pneumothorax. Trachea midline. Aorta atherosclerotic. Degenerative changes of the spine. Bowel distension noted in the epigastric region. IMPRESSION: Similar low lung volumes and chronic interstitial lung disease. No significant interval change or superimposed acute process. Electronically Signed   By: Judie Petit.  Shick M.D.   On: 11/26/2018 16:27      Jerre Simon , [facility] 11/28/2018, 1:49 PM  Pager: [phone] Mon-Wed, Friday 7:00am-4:30pm Thurs  7am-11:30am  Consults: [phone]

## 2018-11-28 NOTE — Progress Notes (Signed)
ANTICOAGULATION CONSULT NOTE - Follow Up Consult  Pharmacy Consult for Heparin Indication:  DVT(PTA apixaban on hold)  Allergies  Allergen Reactions  . Sinemet [Carbidopa W-Levodopa] Nausea And Vomiting    Able to tolerate with Zofran  . Sulfa Antibiotics Other (See Comments)    Flushing and hot    Patient Measurements: Height: 5\' 6"  (167.6 cm) Weight: 130 lb 1.1 oz (59 kg) IBW/kg (Calculated) : 59.3 Heparin Dosing Weight:   Vital Signs: Temp: 98.4 F (36.9 C) (01/31 0343) Temp Source: Oral (01/31 0343) BP: 105/46 (01/31 0442) Pulse Rate: 82 (01/31 0442)  Labs: Recent Labs    11/26/18 1232 11/26/18 1900 11/27/18 0530 11/27/18 2201 11/28/18 0243  HGB 10.9*  --  10.2*  --  8.8*  HCT 33.3*  --  32.0*  --  28.1*  PLT 127*  --  134*  --  125*  APTT  --  48* 78* 92*  --   HEPARINUNFRC  --  >2.20* 1.82*  --  0.62  CREATININE 1.68*  --  1.53*  --  0.86    Estimated Creatinine Clearance: 55.1 mL/min (by C-G formula based on SCr of 0.86 mg/dL).   Medications:  Infusions:  . sodium chloride 100 mL/hr at 11/28/18 0500  . ceFEPime (MAXIPIME) IV Stopped (11/27/18 1758)  . heparin    . sodium chloride    . sodium chloride    . vancomycin      Assessment: Patient with prior PTT and also Heparin level at goal.  No heparin issues, but did have to reenter heparin order for RN as prior heparin order d/c after 12hours---  Goal of Therapy:  Heparin level 0.3-0.7 units/ml aPTT 66-102 seconds Monitor platelets by anticoagulation protocol: Yes   Plan:  Continue heparin drip at current rate Recheck levels at 0900  Darlina Guys, Jacquenette Shone Crowford 11/28/2018,5:23 AM
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
## 2018-11-28 NOTE — Progress Notes (Signed)
ANTICOAGULATION CONSULT NOTE - Follow Up Consult  Pharmacy Consult for Heparin Indication: DVT (PTA apixaban on hold)  Allergies  Allergen Reactions  . Sinemet [Carbidopa W-Levodopa] Nausea And Vomiting    Able to tolerate with Zofran  . Sulfa Antibiotics Other (See Comments)    Flushing and hot    Patient Measurements: Height: 5\' 6"  (167.6 cm) Weight: 130 lb 1.1 oz (59 kg) IBW/kg (Calculated) : 59.3 Heparin Dosing Weight: TBW  Vital Signs: Temp: 98.4 F (36.9 C) (01/31 0343) Temp Source: Oral (01/31 0343) BP: 96/44 (01/31 0630) Pulse Rate: 71 (01/31 0630)  Labs: Recent Labs    11/26/18 1232  11/26/18 1900 11/27/18 0530 11/27/18 2201 11/28/18 0243  HGB 10.9*  --   --  10.2*  --  8.8*  HCT 33.3*  --   --  32.0*  --  28.1*  PLT 127*  --   --  134*  --  125*  APTT  --    < > 48* 78* 92* 124*  HEPARINUNFRC  --   --  >2.20* 1.82*  --  0.62  CREATININE 1.68*  --   --  1.53*  --  0.86   < > = values in this interval not displayed.    Estimated Creatinine Clearance: 55.1 mL/min (by C-G formula based on SCr of 0.86 mg/dL).   Medications:  Infusions:  . sodium chloride 100 mL/hr at 11/28/18 0600  . ceFEPime (MAXIPIME) IV Stopped (11/28/18 0556)  . heparin 1,000 Units/hr (11/28/18 0525)  . sodium chloride 1,000 mL (11/28/18 0646)  . sodium chloride    . vancomycin      Assessment: 40 yoF admitted on 1/29 with osteomyelitis of sacral decubitus ulcer with surgical consult for debridement. Pt was recently diagnosed with RLE DVT and started on Eliquis. Pharmacy is consulted to dose Heparin IV while PTA Eliquis for DVT is on hold.   Significant events: 1/29 Last apixaban dose taken PTA 1/30 Heparin held at 08:00 for debridement, resumed heparin s/p procedure  Today, 11/28/2018:  Heparin level 0.36, therapeutic on heparin at 1000 units/hr  APTT 114, therapeutic and correlating with heparin level   CBC:  Hgb decreased to 8.8, Plt remain low/stable at 125.  SCr  significantly improved to 0.86  No bleeding or complications reported.   Goal of Therapy:  Heparin level 0.3-0.7 units/ml aPTT 66-102 seconds Monitor platelets by anticoagulation protocol: Yes   Plan:   Continue heparin IV infusion at 1000 units/hr  Daily heparin level and CBC  Follow up plans for further debridement (hold heparin 4 hours prior to OR if further I&D planned)   Lynann Beaver PharmD, BCPS Pager [phone] 11/28/2018 11:32 AM

## 2018-11-28 NOTE — Progress Notes (Signed)
Physical Therapy Wound Evaluation Patient Details  Name: Becky Figueroa MRN: [id number] Date of Birth: [birth year]  Today's Date: 11/28/2018 Time: 1323-1400 Time Calculation (min): 37 min  Subjective  Subjective: "Can I have some water?" Patient and Family Stated Goals: Pt unable to state, hx of dementia Date of Onset: (present on admission) Prior Treatments: wound center, s/p bedside debridement 11/27/2018  Pain Score: RN gave tramadol just prior to session.  Pt reports pain with pulsatile lavage and provided rest breaks as needed.  Wound Assessment    11/28/18 1600  Subjective Assessment  Subjective "Can I have some water?"  Patient and Family Stated Goals Pt unable to state, hx of dementia  Date of Onset  (present on admission)  Prior Treatments wound center, s/p bedside debridement 11/27/2018  Evaluation and Treatment  Evaluation and Treatment Procedures Explained to Patient/Family Yes  Evaluation and Treatment Procedures Patient unable to consent due to mental status  Pressure Injury 11/28/18 Unstageable - Full thickness tissue loss in which the base of the ulcer is covered by slough (yellow, tan, gray, green or brown) and/or eschar (tan, brown or black) in the wound bed. HYDROTHERAPY  Date First Assessed/Time First Assessed: 11/28/18 1330   Location: Sacrum  Location Orientation: Mid  Staging: Unstageable - Full thickness tissue loss in which the base of the ulcer is covered by slough (yellow, tan, gray, green or brown) and/or esch...  Dressing Type Moist to dry;Foam;Gauze (Comment)  Dressing Changed  Dressing Change Frequency Twice a day  State of Healing Eschar  Site / Wound Assessment Black;Red;Purple  % Wound base Red or Granulating 20%  % Wound base Black/Eschar 80%  Peri-wound Assessment Purple  Wound Length (cm) 8 cm  Wound Width (cm) 11 cm  Wound Depth (cm)  (unknown due to nonviable tissue still present in wound bed)  Wound Surface Area (cm^2) 88 cm^2   Undermining (cm) present from 12-5 oclock and 10 oclock (at least 2 cm)  Margins Unattached edges (unapproximated)  Drainage Amount Moderate  Drainage Description Odor;Purulent  Treatment Debridement (Selective);Hydrotherapy (Pulse lavage);Packing (Saline gauze)  Hydrotherapy  Pulsed lavage therapy - wound location sacral injury  Pulsed Lavage with Suction (psi) 10 psi  Pulsed Lavage with Suction - Normal Saline Used 1000 mL  Pulsed Lavage Tip Tip with splash shield  Selective Debridement  Selective Debridement - Location sacral injury  Selective Debridement - Tools Used Scissors;Forceps  Selective Debridement - Tissue Removed black nonviable tissue  Wound Therapy - Assess/Plan/Recommendations  Wound Therapy - Clinical Statement Pt admitted with unstageable sacral pressure injury and s/p bedside debridement 11/27/2018.  Seen by surgical PA today after session, plans to take pt to OR when available.  Requested Dakins solution (also in [place] note) for wound at this time and PA aware.  Plan to continue with hydrotherapy to assist with removing nonviable tissue.  Wound Therapy - Functional Problem List Large amount of black nonviable tissue still present in wound bed.  Will continue to assist with hydrotherapy.  Plan is for further surgical debridement as well.  Factors Delaying/Impairing Wound Healing Infection - systemic/local;Immobility;Multiple medical problems (?osteomyelitis of the sacrum)  Hydrotherapy Plan Patient/family education;Dressing change;Debridement;Pulsatile lavage with suction  Wound Therapy - Frequency 6X / week  Wound Therapy - Follow Up Recommendations Skilled nursing facility  Wound Plan Will continue to assist with above plan to decrease necrotic nonviable tissue in this unstageable pressure injury of sacrum  Wound Therapy Goals - Improve the function of patient's integumentary system by progressing the  wound(s) through the phases of wound healing by:  Decrease Necrotic  Tissue to 40  Decrease Necrotic Tissue - Progress Goal set today  Increase Granulation Tissue to 60  Increase Granulation Tissue - Progress Goal set today  Improve Drainage Characteristics Mod  Improve Drainage Characteristics - Progress Progressing toward goal  Patient/Family will be able to  Family able to recall good repositioning techniques.  Patient/Family Instruction Goal - Progress Goal set today  Goals/treatment plan/discharge plan were made with and agreed upon by patient/family Yes  Time For Goal Achievement 2 weeks  Wound Therapy - Potential for Goals Fair                                                                                                                                                                                                                                                                       Hydrotherapy Pulsed lavage therapy - wound location: sacral injury Pulsed Lavage with Suction (psi): 10 psi Pulsed Lavage with Suction - Normal Saline Used: 1000 mL Pulsed Lavage Tip: Tip with splash shield Selective Debridement Selective Debridement - Location: sacral injury Selective Debridement - Tools Used: Scissors;Forceps Selective Debridement - Tissue Removed: black nonviable tissue   Wound Assessment and Plan  Wound Therapy - Assess/Plan/Recommendations Wound Therapy - Clinical Statement: Pt admitted with unstageable sacral pressure injury and s/p bedside debridement 11/27/2018.  Seen by surgical PA today after session, plans to take pt to OR when available.  Requested Dakins solution (also in [place] note) for wound at this time and PA aware.  Plan to continue with hydrotherapy to assist with removing nonviable tissue. Wound Therapy - Functional Problem List: Large amount of black nonviable tissue still present in wound bed.  Will continue to assist with hydrotherapy.  Plan is for further  surgical debridement as well. Factors Delaying/Impairing Wound Healing: Infection - systemic/local;Immobility;Multiple medical problems(?osteomyelitis of the sacrum) Hydrotherapy Plan: Patient/family education;Dressing change;Debridement;Pulsatile lavage with suction Wound Therapy - Frequency: 6X / week Wound Therapy - Follow Up Recommendations: Skilled nursing facility Wound Plan: Will continue to assist with above plan to decrease necrotic nonviable tissue in this unstageable pressure injury of sacrum  Wound Therapy Goals- Improve the function of patient's integumentary system by progressing the wound(s) through  the phases of wound healing (inflammation - proliferation - remodeling) by: Decrease Necrotic Tissue to: 40 Decrease Necrotic Tissue - Progress: Goal set today Increase Granulation Tissue to: 60 Increase Granulation Tissue - Progress: Goal set today Improve Drainage Characteristics: Mod Improve Drainage Characteristics - Progress: Progressing toward goal Patient/Family will be able to : Family able to recall good repositioning techniques. Patient/Family Instruction Goal - Progress: Goal set today Goals/treatment plan/discharge plan were made with and agreed upon by patient/family: Yes Time For Goal Achievement: 2 weeks Wound Therapy - Potential for Goals: Fair  Goals will be updated until maximal potential achieved or discharge criteria met.  Discharge criteria: when goals achieved, discharge from hospital, MD decision/surgical intervention, no progress towards goals, refusal/missing three consecutive treatments without notification or medical reason.  Carmelia Bake, PT, DPT Acute Rehabilitation Services Office: [phone] Pager: [phone]   Trena Platt 11/28/2018, 4:30 PM

## 2018-11-28 NOTE — CV Procedure (Signed)
Echocardiogram not completed per patient request she just wants to rest. Will try again tomorrow.   Leta Jungling RDCS

## 2018-11-28 NOTE — Consult Note (Signed)
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 female who has MHx of parkinson's dementia,  SNF resident who has had worsening sacral decubitus ulcer, getting care at wound care. She was admitted on 1/29 with altered mental status, lethargy and hypotension. On admit, she was afebrile but had several lab abnormalities with hyponatremia, Na of 131, possibly aki, BUN of 53, Cr 1.6,WBC at 10K and her exam was significant for confusion, only being able to say a few words, but a very large eschar sacral decub with erythamatous edge, unable to stage. She was evaluated by general surgery who recommend bedside debridement once anticoagulant (for DVT) was stopped. She is not MRSA colonized. Prior cx of wound in 1/23 showed polymicrobial including ecoli and bacteroides. She was started on vancomycin and cefepime as past of sepsis protocol   Wound care RN noted that wound is : 8cm x 10cm with depth unable to be determined due to presence of nonviable tissue. Undermining from 12-5 o'clock, 10 o'clock  Wound bed: 80% nonviable, 20% red, nongranulating Drainage (amount, consistency, odor) moderate amount serosanguinous Periwound: with periwound erythema, induration and warmth.  MAroon/purple discoloration measuring approximately 3.5cm (evidence of deep tissue pressure injury) at periphery, especially dark from 1-3 o'clock.  Per review of last admit in beginning of the year, she has had a precipitous decline in her dementia and function since early December.  She has wound on her sacrum that improved slightly per patient report, however, her son  mentions that he believes that she was not rotated and cared sufficiently when she was with her husband -now undergoing divorce thus prompting admission at beginning of the year.  Past Medical History:  Diagnosis Date  . Anal stenosis   . Anxiety   . Decubitus ulcer   . 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 Depression   . Fatty liver   . GERD (gastroesophageal reflux disease)   . Hiatal hernia   . History of anal fissures   . History of chronic constipation   . History of kidney stones    1973  . Hyperlipidemia   . Left ureteral calculus   . Parkinson's disease (HCC)   . Wears glasses     Allergies:  Allergies  Allergen Reactions  . Sinemet [Carbidopa W-Levodopa] Nausea And Vomiting    Able to tolerate with Zofran  . Sulfa Antibiotics Other (See Comments)    Flushing and hot    MEDICATIONS: . carbidopa-levodopa  0.5 tablet Oral TID WC  . feeding supplement (ENSURE ENLIVE)  237 mL Oral BID BM  . mouth rinse  15 mL Mouth Rinse BID    Social History   Tobacco Use  . Smoking status: Never Smoker  . Smokeless tobacco: Never Used  Substance Use Topics  . Alcohol use: No  . Drug use: No    Family History  Problem Relation Age of Onset  . Breast cancer Maternal Aunt        x 4  . Pancreatic cancer Maternal Uncle 56  . Diabetes Brother   .  Colon cancer Neg Hx     Review of Systems -  Family reports 20# in the lab 2 months Constitutional: Negative for fever, chills, diaphoresis,  fatigue and unexpected weight change.  HENT: Negative for congestion, sore throat, rhinorrhea, sneezing, trouble swallowing and sinus pressure.  Eyes: Negative for photophobia and visual disturbance.  Respiratory: Negative for cough, chest tightness, shortness of breath, wheezing and stridor.  Cardiovascular: Negative for chest pain, palpitations and leg swelling.  Gastrointestinal: Negative for nausea, vomiting, abdominal pain, diarrhea, constipation, blood in stool,  abdominal distention and anal bleeding.  Genitourinary: Negative for dysuria, hematuria, flank pain and difficulty urinating.  Musculoskeletal:pain to her wound Skin: Negative for color change, pallor, rash and wound.  Neurological: Negative for dizziness, tremors, weakness and light-headedness.  Hematological: Negative for adenopathy. Does not bruise/bleed easily.  Psychiatric/Behavioral: Negative for behavioral problems, confusion, sleep disturbance, dysphoric mood, decreased concentration and agitation.     OBJECTIVE: Temp:  [97.5 F (36.4 C)-98.4 F (36.9 C)] 98.1 F (36.7 C) (01/31 1200) Pulse Rate:  [65-85] 69 (01/31 1600) Resp:  [14-31] 28 (01/31 1600) BP: (74-117)/(32-98) 96/43 (01/31 1600) SpO2:  [85 %-100 %] 100 % (01/31 1600) Physical Exam  Constitutional:  oriented to person, self. appears chronically ill and malnourished. No distress.  HENT: [place]/AT, PERRLA, no scleral icterus Mouth/Throat: Oropharynx is clear and moist. No oropharyngeal exudate.  Cardiovascular: Normal rate, regular rhythm and normal heart sounds. Exam reveals no gallop and no friction rub.  No murmur heard.  Pulmonary/Chest: Effort normal and breath sounds normal. No respiratory distress.  has no wheezes.  Neck = supple, no nuchal rigidity Abdominal: Soft. Bowel sounds are normal.  exhibits no distension. There is no tenderness. Buttock =packed. Per pictures from today by general surgery Lymphadenopathy: no cervical adenopathy. No axillary adenopathy Neurological: alert and oriented to person, place, and time.  Skin: Skin is warm and dry. No rash noted. No erythema. Anasarca, scattered echymosis Psychiatric: a normal mood and affect.  behavior is normal.    LABS: Results for orders placed or performed during the hospital encounter of 11/26/18 (from the past 48 hour(s))  APTT     Status: Abnormal   Collection Time: 11/26/18  7:00 PM  Result Value Ref Range   aPTT 48 (H) 24 - 36 seconds    Comment:         IF BASELINE aPTT IS ELEVATED, SUGGEST PATIENT RISK ASSESSMENT BE USED TO DETERMINE APPROPRIATE ANTICOAGULANT THERAPY. Performed at [facility], [address] [place], [place] [address]   Heparin level (unfractionated)     Status: Abnormal   Collection Time: 11/26/18  7:00 PM  Result Value Ref Range   Heparin Unfractionated >2.20 (H) 0.30 - 0.70 IU/mL    Comment: RESULTS CONFIRMED BY MANUAL DILUTION Performed at [facility], [address] [place], [place] [address]   TSH     Status: None   Collection Time: 11/26/18 10:38 PM  Result Value Ref Range   TSH 2.186 0.350 - 4.500 uIU/mL    Comment: Performed by a 3rd Generation assay with a functional sensitivity of <=0.01 uIU/mL. Performed at [facility], [address] [place], [place] [address]   Urinalysis, Routine w reflex microscopic     Status: Abnormal   Collection Time: 11/27/18  5:21 AM  Result Value Ref Range   Color, Urine AMBER (A) YELLOW    Comment: BIOCHEMICALS MAY BE AFFECTED BY COLOR   APPearance HAZY (A) CLEAR  Specific Gravity, Urine 1.017 1.005 - 1.030   pH 5.0 5.0 - 8.0   Glucose, UA NEGATIVE NEGATIVE mg/dL   Hgb urine dipstick NEGATIVE NEGATIVE   Bilirubin Urine NEGATIVE NEGATIVE   Ketones, ur NEGATIVE NEGATIVE mg/dL   Protein, ur NEGATIVE NEGATIVE mg/dL   Nitrite NEGATIVE NEGATIVE   Leukocytes, UA NEGATIVE NEGATIVE    Comment: Performed at [facility], [address] [place], [place] [address]  APTT     Status: Abnormal   Collection Time: 11/27/18  5:30 AM  Result Value Ref Range   aPTT 78 (H) 24 - 36 seconds    Comment:        IF BASELINE aPTT IS ELEVATED, SUGGEST PATIENT RISK ASSESSMENT BE USED TO DETERMINE APPROPRIATE ANTICOAGULANT THERAPY. Performed at [facility], [address] [place], [place] [address]   Heparin level (unfractionated)     Status: Abnormal   Collection Time: 11/27/18   5:30 AM  Result Value Ref Range   Heparin Unfractionated 1.82 (H) 0.30 - 0.70 IU/mL    Comment: RESULTS CONFIRMED BY MANUAL DILUTION (NOTE) If heparin results are below expected values, and patient dosage has  been confirmed, suggest follow up testing of antithrombin III levels. Performed at [facility], [address] [place], [place] [address]   Comprehensive metabolic panel     Status: Abnormal   Collection Time: 11/27/18  5:30 AM  Result Value Ref Range   Sodium 130 (L) 135 - 145 mmol/L   Potassium 4.3 3.5 - 5.1 mmol/L   Chloride 100 98 - 111 mmol/L   CO2 22 22 - 32 mmol/L   Glucose, Bld 127 (H) 70 - 99 mg/dL   BUN 50 (H) 8 - 23 mg/dL   Creatinine, Ser 9.141.53 (H) 0.44 - 1.00 mg/dL   Calcium 7.7 (L) 8.9 - 10.3 mg/dL   Total Protein 6.1 (L) 6.5 - 8.1 g/dL   Albumin 2.3 (L) 3.5 - 5.0 g/dL   AST 47 (H) 15 - 41 U/L   ALT 22 0 - 44 U/L   Alkaline Phosphatase 123 38 - 126 U/L   Total Bilirubin 1.0 0.3 - 1.2 mg/dL   GFR calc non Af Amer 34 (L) >60 mL/min   GFR calc Af Amer 39 (L) >60 mL/min   Anion gap 8 5 - 15    Comment: Performed at [facility], [address] [place], [place] [address]  CBC     Status: Abnormal   Collection Time: 11/27/18  5:30 AM  Result Value Ref Range   WBC 10.1 4.0 - 10.5 K/uL   RBC 3.33 (L) 3.87 - 5.11 MIL/uL   Hemoglobin 10.2 (L) 12.0 - 15.0 g/dL   HCT 62.132.0 (L) 30.836.0 - 65.746.0 %   MCV 96.1 80.0 - 100.0 fL   MCH 30.6 26.0 - 34.0 pg   MCHC 31.9 30.0 - 36.0 g/dL   RDW 84.613.7 96.211.5 - 95.215.5 %   Platelets 134 (L) 150 - 400 K/uL   nRBC 0.0 0.0 - 0.2 %    Comment: Performed at [facility], [address] [place], [place] [address]  MRSA PCR Screening     Status: None   Collection Time: 11/27/18  2:43 PM  Result Value Ref Range   MRSA by PCR NEGATIVE NEGATIVE    Comment:        The GeneXpert MRSA Assay (FDA approved for NASAL specimens only), is one component of a comprehensive  MRSA  colonization surveillance program. It is not intended to diagnose MRSA infection nor to guide or monitor treatment for MRSA infections. Performed at [facility], [address] [place], [place] [address]   APTT     Status: Abnormal   Collection Time: 11/27/18 10:01 PM  Result Value Ref Range   aPTT 92 (H) 24 - 36 seconds    Comment:        IF BASELINE aPTT IS ELEVATED, SUGGEST PATIENT RISK ASSESSMENT BE USED TO DETERMINE APPROPRIATE ANTICOAGULANT THERAPY. Performed at [facility], [address], [place], [place] [address]   CBC     Status: Abnormal   Collection Time: 11/28/18  2:43 AM  Result Value Ref Range   WBC 5.8 4.0 - 10.5 K/uL   RBC 2.81 (L) 3.87 - 5.11 MIL/uL   Hemoglobin 8.8 (L) 12.0 - 15.0 g/dL   HCT 01.0 (L) 27.2 - 53.6 %   MCV 100.0 80.0 - 100.0 fL   MCH 31.3 26.0 - 34.0 pg   MCHC 31.3 30.0 - 36.0 g/dL   RDW 64.4 03.4 - 74.2 %   Platelets 125 (L) 150 - 400 K/uL    Comment: SPECIMEN CHECKED FOR CLOTS Immature Platelet Fraction may be clinically indicated, consider ordering this additional test [id number] CONSISTENT WITH PREVIOUS RESULT    nRBC 0.0 0.0 - 0.2 %    Comment: Performed at [facility], [address] [place], [place] [address]  Basic metabolic panel     Status: Abnormal   Collection Time: 11/28/18  2:43 AM  Result Value Ref Range   Sodium 136 135 - 145 mmol/L   Potassium 3.4 (L) 3.5 - 5.1 mmol/L    Comment: DELTA CHECK NOTED REPEATED TO VERIFY NO VISIBLE HEMOLYSIS    Chloride 113 (H) 98 - 111 mmol/L   CO2 19 (L) 22 - 32 mmol/L   Glucose, Bld 101 (H) 70 - 99 mg/dL   BUN 27 (H) 8 - 23 mg/dL   Creatinine, Ser 9.51 0.44 - 1.00 mg/dL   Calcium 6.7 (L) 8.9 - 10.3 mg/dL   GFR calc non Af Amer >60 >60 mL/min   GFR calc Af Amer >60 >60 mL/min   Anion gap 4 (L) 5 - 15    Comment: Performed at [facility], [address], [place], [place] [address]  Heparin level  (unfractionated)     Status: None   Collection Time: 11/28/18  2:43 AM  Result Value Ref Range   Heparin Unfractionated 0.62 0.30 - 0.70 IU/mL    Comment: (NOTE) If heparin results are below expected values, and patient dosage has  been confirmed, suggest follow up testing of antithrombin III levels. Performed at [facility], [address] [place], [place] [address]   APTT     Status: Abnormal   Collection Time: 11/28/18  2:43 AM  Result Value Ref Range   aPTT 124 (H) 24 - 36 seconds    Comment:        IF BASELINE aPTT IS ELEVATED, SUGGEST PATIENT RISK ASSESSMENT BE USED TO DETERMINE APPROPRIATE ANTICOAGULANT THERAPY. Performed at [facility], [address], [place], [place] [address]   Heparin level (unfractionated)     Status: None   Collection Time: 11/28/18 10:01 AM  Result Value Ref Range   Heparin Unfractionated 0.36 0.30 - 0.70 IU/mL    Comment: (NOTE) If heparin results are below expected values, and patient dosage has  been confirmed, suggest follow up testing of antithrombin III levels. Performed at [facility], [address], [place], [place] [address]   APTT     Status: Abnormal   Collection Time: 11/28/18 10:01 AM  Result Value Ref Range   aPTT 114 (H) 24 - 36 seconds    Comment: Performed at [facility], [address], [place], [place] [address]  TSH     Status: None   Collection Time: 11/28/18 10:01 AM  Result Value Ref Range   TSH 2.114 0.350 - 4.500 uIU/mL    Comment: Performed by a 3rd Generation assay with a functional sensitivity of <=0.01 uIU/mL. Performed at [facility], [address], [place], [place] [address]     MICRO:  IMAGING: Dg Chest 1 View  Result Date: 11/28/2018 CLINICAL DATA:  Hypoxia. EXAM: CHEST  1 VIEW COMPARISON:  11/26/2018. FINDINGS: Stable poor inspiration and grossly normal sized heart. Slight increase in diffuse  prominence of the interstitial markings. Interval minimal right pleural effusion. Diffuse osteopenia and mild thoracic spine degenerative changes. Minimal right shoulder degenerative changes. IMPRESSION: Interval minimal changes of congestive heart failure superimposed on chronic interstitial lung disease. Electronically Signed   By: Beckie Salts M.D.   On: 11/28/2018 15:10   Dg Abd 1 View  Result Date: 11/26/2018 CLINICAL DATA:  Constipation EXAM: ABDOMEN - 1 VIEW COMPARISON:  08/08/2018 FINDINGS: Moderately dilated colon which is gas-filled. Small bowel nondilated. Surgical clips right upper quadrant. Left renal calculus unchanged. IMPRESSION: Colonic ileus Electronically Signed   By: Marlan Palau M.D.   On: 11/26/2018 16:42   Dg Chest Port 1 View  Result Date: 11/26/2018 CLINICAL DATA:  Leukocytosis, decubitus ulcer, shortness of breath EXAM: PORTABLE CHEST 1 VIEW COMPARISON:  11/02/2018 FINDINGS: Similar low lung volumes with chronic appearing bronchitic changes and interstitial lung disease as before. No definite superimposed pneumonia, collapse, edema, effusion or pneumothorax. Trachea midline. Aorta atherosclerotic. Degenerative changes of the spine. Bowel distension noted in the epigastric region. IMPRESSION: Similar low lung volumes and chronic interstitial lung disease. No significant interval change or superimposed acute process. Electronically Signed   By: Judie Petit.  Shick M.D.   On: 11/26/2018 16:27     Assessment/Plan:  Dementia, immobility leading to unstageable pressure ulcer with high suspicion for sacral osteomyelitis - please check sed rate and crp - recommend mri of pelvis to see depth and if bone involvement - for now would continue on piptazo, and would d/c vancomycin since not known to be a colonizer. - would ask surgery to get tissue cx after next bedside debridement - eliminating vancomycin will also help with risk of aki   Malnutrition = reports improved appetite. Recommend  nutritional supplementation  Dementia = continue on home medication  Will see back on Monday. Dr Orvan Falconer available for questions

## 2018-11-28 NOTE — Progress Notes (Signed)
Pharmacy Antibiotic Note  Becky Figueroa is a 73 y.o. female admitted on 11/26/2018 from wound care center with worsening decubitus ulcer, Xray concerning for osteomyelitis.  S/p surgical debridement on 1/30.  Pharmacy has been consulted for Cefepime and Vancomycin dosing.  Today, 11/28/2018:  Afebrile  WBC decreased to WNL  SCr down to 0.86   Plan:  Cefepime 2g IV q12h  Increase to Vancomycin 1000 mg IV q24h.  Goal AUC 400-550.  Expected AUC: 487 using SCr 0.9  Follow up renal fxn, culture results, and clinical course.   Height: 5\' 6"  (167.6 cm) Weight: 130 lb 1.1 oz (59 kg) IBW/kg (Calculated) : 59.3  Temp (24hrs), Avg:97.9 F (36.6 C), Min:97.5 F (36.4 C), Max:98.4 F (36.9 C)  Recent Labs  Lab 11/26/18 1232 11/26/18 1502 11/27/18 0530 11/28/18 0243  WBC 13.1*  --  10.1 5.8  CREATININE 1.68*  --  1.53* 0.86  LATICACIDVEN  --  1.0  --   --     Estimated Creatinine Clearance: 55.1 mL/min (by C-G formula based on SCr of 0.86 mg/dL).    Allergies  Allergen Reactions  . Sinemet [Carbidopa W-Levodopa] Nausea And Vomiting    Able to tolerate with Zofran  . Sulfa Antibiotics Other (See Comments)    Flushing and hot    Antimicrobials this admission: PTA cipro - started 1/28 >> 1/29 1/29 Vancomycin >> 1/29 Cefepime >>   Dose adjustments this admission: 1/31 Vancomycin empirically adjusted for renal function  Microbiology results: 1/29 BCx: ngtd 1/30 MRSA PCR: neg  Thank you for allowing pharmacy to be a part of this patient's care.  Lynann Beaver PharmD, BCPS Pager [phone] 11/28/2018 11:34 AM

## 2018-11-28 NOTE — Progress Notes (Signed)
Initial Nutrition Assessment  DOCUMENTATION CODES:   (Will assess for malnutrition at follow-up. )  INTERVENTION:  - Will order Ensure Enlive BID, each supplement provides 350 kcal and 20 grams of protein. - Continue to encourage PO intakes. - Once sepsis has improved/resolved, will order Juven BID.    NUTRITION DIAGNOSIS:   Increased nutrient needs related to wound healing, acute illness(sepsis) as evidenced by estimated needs.  GOAL:   Patient will meet greater than or equal to 90% of their needs  MONITOR:   PO intake, Supplement acceptance, Weight trends, Labs, Skin  REASON FOR ASSESSMENT:   Malnutrition Screening Tool  ASSESSMENT:   73 y.o. female with past medical history significant for dementia with Parkinson's disease, depression, and decubitus ulcer. Son also reports recent dx of RLE DVT and that patient was started on anticoagulation. She was referred to the ED from the wound care center d/t worsening of her wound. Patient was noted to be very lethargic and confused in the ED.  BMI indicates normal weight. No intakes documented since admission. Patient sleeping at the time of RD visit and son, who is at bedside, provided all information. He had just ordered lunch for patient and it had not arrived. He reports that patient consumed 100% of dinner last night (510 kcal, 16 grams of protein) and 100% of breakfast this AM (525 kcal, 20 grams of protein). He denies patient having any chewing or swallowing difficulties.  Son reports that patient previously had a fairly good appetite, but that she has been at [facility] for ~1 month and was eating mainly 20-25% of meals there. Son feels that this may have been d/t not feeling well or the food provided, but feels that it more so had to do with patient being depressed and angry at family about being at [facility].   Son reports that prior to going to SNF, he provided patient with 2 bottles of Boost/day and that she greatly enjoyed these. He  states that staff at [facility] stated plan to add Boost or equivalent to orders but he does not feel that this was ever done. He is very agreeable to Ensure during admission. Talked to him about adding Juven once sepsis has improved or resolved and he is very open to this also.   Per chart review, current weight is 130 lb, but question if this was a stated weight. Weight on 11/06/2018 appears copied from weight on 10/11/2018 which was 138 lb. Son is unsure of UBW and unable to state with certainty if patient's weight had changed/if she had lost weight while at SNF. Will monitor weight trends closely.    Medications reviewed; 2 g IV Mg sulfate x1 run 1/31, 40 mEq K-Dur x1 dose 1/31. Labs reviewed; K: 3.4 mmol/l, Cl: 113 mmol/l, BUN: 27 mg/dl, Ca: 6.7 mg/dl. IVF; NS @ 100 ml/hr.     NUTRITION - FOCUSED PHYSICAL EXAM:  Son requested that this be done another time.  Diet Order:   Diet Order            Diet regular Room service appropriate? Yes with Assist; Fluid consistency: Thin  Diet effective now              EDUCATION NEEDS:   No education needs have been identified at this time  Skin:  Skin Assessment: Skin Integrity Issues: Skin Integrity Issues:: DTI, Unstageable DTI: sacrum Unstageable: full thickness to sacrum  Last BM:  1/31  Height:   Ht Readings from Last 1 Encounters:  11/27/18 5\' 6"  (1.676 m)    Weight:   Wt Readings from Last 1 Encounters:  11/27/18 59 kg    Ideal Body Weight:  59.09 kg  BMI:  Body mass index is 20.99 kg/m.  Estimated Nutritional Needs:   Kcal:  1900-2100  Protein:  90-105 grams  Fluid:  >/= 2 L/day      Trenton Gammon, MS, RD, LDN, [facility] Inpatient Clinical Dietitian Pager # [phone] After hours/weekend pager # [phone]

## 2018-11-28 NOTE — Consult Note (Signed)
CARDIOLOGY CONSULT NOTE  Patient ID: Becky Juneauatricia P Chinn MRN: [id number] DOB/AGE: 73/19/1947 73 y.o.  Admit date: 11/26/2018 Referring Physician  Blanchard ManeElizabeth Matthews, MD Primary Physician:  Richmond CampbellKaplan, Kristen W., PA-C Reason for Consultation  Abnormal EKG, PAC  HPI: Becky Figueroa  is a 73 y.o. female  With Patient admitted for decub ulceration, worsening mental status and lethargy.  While in the emergency room and also on telemetry, she was found to be tachycardic.  Due to abnormal EKG was consulted.  Patient presently appears to be no acute distress she was very alert and I was able to obtain much of the history from the patient directly.  States that she is presently feeling well and denies any chest pain or shortness of breath.  States that she is being in the hospital she feels remarkably well.   This morning she had urinary retention, since decompression of the bladder, states that she feels much more comfortable.  Past Medical History:  Diagnosis Date  . Anal stenosis   . Anxiety   . Decubitus ulcer   . 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 Depression   . Fatty liver   . GERD (gastroesophageal reflux disease)   . Hiatal hernia   . History of anal fissures   . History of chronic constipation   . History of kidney stones    1973  . Hyperlipidemia   . Left ureteral calculus   . Parkinson's disease (HCC)   . Wears glasses      Past Surgical History:  Procedure Laterality Date  . ANAL FISSURECTOMY  1994   and Hemorrhoidectomy  . BOTOX INJECTION N/A 11/18/2013   Procedure: BOTOX INJECTION;  Surgeon: Hart Carwinora M Brodie, MD;  Location: [facility];  Service: Endoscopy;  Laterality: N/A;  . CHOLECYSTECTOMY OPEN  1982  . CYSTOSCOPY WITH RETROGRADE PYELOGRAM, URETEROSCOPY AND STENT PLACEMENT Left 06/06/2016   Procedure: CYSTOSCOPY WITH RETROGRADE PYELOGRAM, URETEROSCOPY AND STENT PLACEMENT;  Surgeon: Barron Alvineavid Grapey, MD;  [facility] [place];  Service:  Urology;  Laterality: Left;  . FLEXIBLE SIGMOIDOSCOPY N/A 11/18/2013   Procedure: FLEXIBLE SIGMOIDOSCOPY/ with BOTOX;  Surgeon: Hart Carwinora M Brodie, MD;  Location: [facility];  Service: Endoscopy;  Laterality: N/A;  . HOLMIUM LASER APPLICATION Left 06/06/2016   Procedure: HOLMIUM LASER APPLICATION;  Surgeon: Barron Alvineavid Grapey, MD;  [facility] [place];  Service: Urology;  Laterality: Left;  . TONSILLECTOMY  age 73  . TOTAL ABDOMINAL HYSTERECTOMY W/ BILATERAL SALPINGOOPHORECTOMY  1984   and Appendectomy     Family History  Problem Relation Age of Onset  . Breast cancer Maternal Aunt        x 4  . Pancreatic cancer Maternal Uncle 56  . Diabetes Brother   . Colon cancer Neg Hx      Social History: Social History   Socioeconomic History  . Marital status: Legally Separated    Spouse name: Not on file  . Number of children: 4  . Years of education: Not on file  . Highest education level: Not on file  Occupational History  . Occupation: Engineer, civil (consulting)nurse    Comment: retired  Engineer, productionocial Needs  . Financial resource strain: Not on file  . Food insecurity:    Worry: Not on file    Inability: Not on file  . Transportation needs:    Medical: Not on file    Non-medical: Not on file  Tobacco Use  . Smoking status: Never Smoker  . Smokeless tobacco: Never Used  Substance and Sexual Activity  . Alcohol use: No  . Drug use: No  . Sexual activity: Not on file  Lifestyle  . Physical activity:    Days per week: Not on file    Minutes per session: Not on file  . Stress: Not on file  Relationships  . Social connections:    Talks on phone: Not on file    Gets together: Not on file    Attends religious service: Not on file    Active member of club or organization: Not on file    Attends meetings of clubs or organizations: Not on file    Relationship status: Not on file  . Intimate partner violence:    Fear of current or ex partner: Not on file    Emotionally abused: Not on file     Physically abused: Not on file    Forced sexual activity: Not on file  Other Topics Concern  . Not on file  Social History Narrative  . Not on file     Medications Prior to Admission  Medication Sig Dispense Refill Last Dose  . ALPRAZolam (XANAX) 0.5 MG tablet Take 1 tablet (0.5 mg total) by mouth 3 (three) times daily as needed for anxiety. 10 tablet 0 11/26/2018 at Unknown time  . apixaban (ELIQUIS) 5 MG TABS tablet Take 10 mg by mouth 2 (two) times daily.   11/26/2018 at 0830  . carbidopa-levodopa (SINEMET IR) 25-100 MG tablet 1/2 tablet twice a day for 3 weeks, then take 1/2 tablet three times a day (Patient taking differently: Take 0.5 tablets by mouth 3 (three) times daily. ) 60 tablet 3 11/26/2018 at am  . ciprofloxacin (CIPRO) 500 MG tablet Take 500 mg by mouth 2 (two) times daily.   11/26/2018 at Unknown time  . lansoprazole (PREVACID) 30 MG capsule Take 30 mg by mouth every morning.    11/26/2018 at 0630  . Multiple Vitamins-Minerals (ONE-A-DAY WOMENS 50 PLUS PO) Take 1 tablet by mouth daily.   11/26/2018 at Unknown time  . Nutritional Supplements (RESOURCE 2.0) LIQD Take 120 mLs by mouth 2 (two) times daily.   11/26/2018 at Unknown time  . NUTRITIONAL SUPPLEMENTS PO Take 1 Units by mouth daily at 12 noon. "Magic Cup."   Unknown at    . ondansetron (ZOFRAN) 4 MG tablet Take 1 tablet (4 mg total) by mouth every 8 (eight) hours as needed for nausea or vomiting. (Patient taking differently: Take 4 mg by mouth every 8 (eight) hours. Takes with carbidopa-levodopa) 20 tablet 3 Unknown  . traMADol (ULTRAM) 50 MG tablet Take 50 mg by mouth every 6 (six) hours as needed for moderate pain.    11/26/2018 at Unknown time  . fluconazole (DIFLUCAN) 100 MG tablet Take 1 tablet (100 mg total) by mouth daily. (Patient not taking: Reported on 11/26/2018) 5 tablet 0 Completed Course at Unknown time    Review of Systems  Constitutional: Positive for malaise/fatigue. Negative for chills and fever.  HENT:  Negative.   Eyes: Negative.   Respiratory: Negative.   Cardiovascular: Negative.   Gastrointestinal: Negative.   Genitourinary: Negative.   Musculoskeletal: Positive for joint pain.  Skin: Positive for rash (ducub ulcer).  Neurological: Positive for dizziness and focal weakness (legs).  Psychiatric/Behavioral: Negative.   All other systems reviewed and are negative.   Physical Exam: Blood pressure (!) 117/98, pulse 83, temperature (!) 97.5 F (36.4 C), temperature source Oral, resp. rate (!) 23, height 5\' 6"  (1.676 m), weight 59  kg, SpO2 100 %.  Physical Exam  Constitutional: She is oriented to person, place, and time.  Patient appears to be in no acute distress, is frail looking, moderately built.  HENT:  Head: Atraumatic.  Eyes: Conjunctivae are normal.  Neck: Neck supple. No JVD present. No thyromegaly present.  Cardiovascular: Normal rate, regular rhythm and normal heart sounds. Exam reveals no gallop and no friction rub.  No murmur heard. Pulses:      Carotid pulses are 3+ on the right side and 3+ on the left side.      Dorsalis pedis pulses are 1+ on the right side and 1+ on the left side.       Posterior tibial pulses are 1+ on the right side and 1+ on the left side.  Pulmonary/Chest: Effort normal and breath sounds normal.  Abdominal: Soft. Bowel sounds are normal.  Musculoskeletal: Normal range of motion.        General: Edema (1+ pitting edema bilateral legs below the knee.) present.  Neurological: She is alert and oriented to person, place, and time.  Skin: Skin is warm and dry.    Labs:  BNP (last 3 results) No results for input(s): BNP in the last 8760 hours.  CMP Latest Ref Rng & Units 11/28/2018 11/27/2018 11/26/2018  Glucose 70 - 99 mg/dL 837(G) 902(X) 115(Z)  BUN 8 - 23 mg/dL 20(E) 02(M) 33(K)  Creatinine 0.44 - 1.00 mg/dL 1.22 4.49(P) 5.30(Y)  Sodium 135 - 145 mmol/L 136 130(L) 131(L)  Potassium 3.5 - 5.1 mmol/L 3.4(L) 4.3 3.9  Chloride 98 - 111 mmol/L  113(H) 100 94(L)  CO2 22 - 32 mmol/L 19(L) 22 24  Calcium 8.9 - 10.3 mg/dL 6.7(L) 7.7(L) 8.2(L)  Total Protein 6.5 - 8.1 g/dL - 6.1(L) 6.6  Total Bilirubin 0.3 - 1.2 mg/dL - 1.0 1.2  Alkaline Phos 38 - 126 U/L - 123 137(H)  AST 15 - 41 U/L - 47(H) 37  ALT 0 - 44 U/L - 22 24     CBC Latest Ref Rng & Units 11/28/2018 11/27/2018 11/26/2018  WBC 4.0 - 10.5 K/uL 5.8 10.1 13.1(H)  Hemoglobin 12.0 - 15.0 g/dL 5.1(T) 10.2(L) 10.9(L)  Hematocrit 36.0 - 46.0 % 28.1(L) 32.0(L) 33.3(L)  Platelets 150 - 400 K/uL 125(L) 134(L) 127(L)     ProBNP (last 3 results) No results for input(s): PROBNP in the last 8760 hours.    Lipid Panel  No results found for: CHOL, TRIG, HDL, CHOLHDL, VLDL, LDLCALC, LDLDIRECT   BNP (last 3 results) No results for input(s): BNP in the last 8760 hours.  ProBNP (last 3 results) No results for input(s): PROBNP in the last 8760 hours.   HEMOGLOBIN A1C No results found for: HGBA1C, MPG  Cardiac Panel (last 3 results) No results for input(s): CKTOTAL, CKMB, TROPONINI, RELINDX in the last 72 hours.  TSH Recent Labs    11/02/18 1242 11/26/18 2238  TSH 0.516 2.186     Radiology: Dg Sacrum/coccyx  Result Date: 11/26/2018 CLINICAL DATA:  Sacral decubitus ulcer. EXAM: SACRUM AND COCCYX - 2+ VIEW COMPARISON:  CT abdomen and pelvis 04/19/2016. FINDINGS: The inferior aspect of the sacrum appears exposed to air. Technique is limited but there appears to be bony destructive change in the underlying distal sacrum worrisome for osteomyelitis. No radiopaque foreign body is identified. IMPRESSION: Large appearing sacral decubitus ulcer with findings worrisome for osteomyelitis in the distal sacrum. MRI of the sacrum with contrast is the best test for further evaluation. Electronically Signed  By: Drusilla Kannerhomas  Dalessio M.D.   On: 11/26/2018 12:58   Dg Abd 1 View  Result Date: 11/26/2018 CLINICAL DATA:  Constipation EXAM: ABDOMEN - 1 VIEW COMPARISON:  08/08/2018 FINDINGS:  Moderately dilated colon which is gas-filled. Small bowel nondilated. Surgical clips right upper quadrant. Left renal calculus unchanged. IMPRESSION: Colonic ileus Electronically Signed   By: Marlan Palauharles  Clark M.D.   On: 11/26/2018 16:42   Ct Head Wo Contrast  Result Date: 11/26/2018 CLINICAL DATA:  Encephalopathy EXAM: CT HEAD WITHOUT CONTRAST TECHNIQUE: Contiguous axial images were obtained from the base of the skull through the vertex without intravenous contrast. Sagittal and coronal MPR images reconstructed from axial data set. COMPARISON:  11/02/2018 Correlation: MR brain 11/02/2018. FINDINGS: Brain: Normal ventricular morphology. No midline shift or mass effect. Normal appearance of brain parenchyma. No intracranial hemorrhage, mass lesion or evidence of acute infarction. Minimal chronic prominence of the extra-axial spaces bilaterally over the convexities correspond to minimal chronic subdural collections likely hygromas, not significantly changed. No new extra-axial collections. Vascular: Unremarkable Skull: Intact though mildly demineralized. Sinuses/Orbits: Clear Other: N/A IMPRESSION: No acute intracranial abnormalities. 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 Electronically Signed   By: Ulyses SouthwardMark  Boles M.D.   On: 11/26/2018 16:23   Dg Chest Port 1 View  Result Date: 11/26/2018 CLINICAL DATA:  Leukocytosis, decubitus ulcer, shortness of breath EXAM: PORTABLE CHEST 1 VIEW COMPARISON:  11/02/2018 FINDINGS: Similar low lung volumes with chronic appearing bronchitic changes and interstitial lung disease as before. No definite superimposed pneumonia, collapse, edema, effusion or pneumothorax. Trachea midline. Aorta atherosclerotic. Degenerative changes of the spine. Bowel distension noted in the epigastric region. IMPRESSION: Similar low lung volumes and chronic interstitial lung disease. No significant interval change or superimposed acute  process. Electronically Signed   By: Judie PetitM.  Shick M.D.   On: 11/26/2018 16:27    Scheduled Meds: . carbidopa-levodopa  0.5 tablet Oral TID WC  . mouth rinse  15 mL Mouth Rinse BID   Continuous Infusions: . sodium chloride 100 mL/hr at 11/28/18 0600  . ceFEPime (MAXIPIME) IV Stopped (11/28/18 0556)  . heparin 1,000 Units/hr (11/28/18 0831)  . magnesium sulfate 1 - 4 g bolus IVPB 2 g (11/28/18 0846)  . sodium chloride    . vancomycin     PRN Meds:.acetaminophen **OR** acetaminophen, ALPRAZolam, ondansetron **OR** ondansetron (ZOFRAN) IV, traMADol  CARDIAC STUDIES:  EKG: Normal sinus rhythm, normal axis, PACs, nonspecific T abnormality. Telemetry reveals sinus rhythm, frequent PACs.  There is no SVT.  Echocardiogram pending.  ASSESSMENT AND PLAN:  1.  Sepsis due to decub ulcer involving the sacrum, question osteomyelitis. 2.  Septic shock, now hemodynamics have improved with fluid resuscitation.  Patient is alert and oriented x3. 3.  Abnormal EKG, suspect frequent PACs secondary to metabolic issues.  Do not suspect any acute cardiac abnormality.  No heart murmur, no gallop, lungs are clear, there is no prior cardiac history.  Recommendation: Continue antibiotics and IV fluid management, nothing specific from cardiac standpoint, agree with obtaining an echocardiogram, unless abnormal I will see her back on a as needed basis.  Thank you for the consultation.  Yates DecampJay Razi Hickle, MD, [facility]ACC 11/28/2018, 9:11 AM [facility] [place] Pager: [phone] Office: [phone] If no answer Cell [phone]

## 2018-11-29 ENCOUNTER — Inpatient Hospital Stay: Payer: Self-pay

## 2018-11-29 ENCOUNTER — Inpatient Hospital Stay (HOSPITAL_COMMUNITY): Payer: Medicare Other

## 2018-11-29 DIAGNOSIS — A419 Sepsis, unspecified organism: Principal | ICD-10-CM

## 2018-11-29 DIAGNOSIS — R9431 Abnormal electrocardiogram [ECG] [EKG]: Secondary | ICD-10-CM

## 2018-11-29 LAB — BASIC METABOLIC PANEL
Anion gap: 10 (ref 5–15)
BUN: 21 mg/dL (ref 8–23)
CO2: 24 mmol/L (ref 22–32)
Calcium: 7.7 mg/dL — ABNORMAL LOW (ref 8.9–10.3)
Chloride: 111 mmol/L (ref 98–111)
Creatinine, Ser: 0.87 mg/dL (ref 0.44–1.00)
GFR calc Af Amer: >60 mL/min (ref >60)
GFR calc non Af Amer: >60 mL/min (ref >60)
Glucose, Bld: 125 mg/dL — ABNORMAL HIGH (ref 70–99)
Potassium: 3.8 mmol/L (ref 3.5–5.1)
Sodium: 145 mmol/L (ref 135–145)

## 2018-11-29 LAB — CBC
HCT: 29 % — ABNORMAL LOW (ref 36.0–46.0)
Hemoglobin: 8.6 g/dL — ABNORMAL LOW (ref 12.0–15.0)
MCH: 25.8 pg — ABNORMAL LOW (ref 26.0–34.0)
MCHC: 29.7 g/dL — ABNORMAL LOW (ref 30.0–36.0)
MCV: 87.1 fL (ref 80.0–100.0)
Platelets: 367 10*3/uL (ref 150–400)
RBC: 3.33 MIL/uL — ABNORMAL LOW (ref 3.87–5.11)
RDW: 17 % — ABNORMAL HIGH (ref 11.5–15.5)
WBC: 17 10*3/uL — ABNORMAL HIGH (ref 4.0–10.5)
nRBC: 1 % — ABNORMAL HIGH (ref 0.0–0.2)

## 2018-11-29 LAB — C-REACTIVE PROTEIN: CRP: 26.8 mg/dL — ABNORMAL HIGH (ref <1.0)

## 2018-11-29 LAB — ECHOCARDIOGRAM COMPLETE
Height: 66 in
Weight: 2081.14 oz

## 2018-11-29 LAB — PROCALCITONIN: PROCALCITONIN: 0.35 ng/mL

## 2018-11-29 LAB — SEDIMENTATION RATE: Sed Rate: 103 mm/hr — ABNORMAL HIGH (ref 0–22)

## 2018-11-29 LAB — HEPARIN LEVEL (UNFRACTIONATED): Heparin Unfractionated: 0.32 IU/mL (ref 0.30–0.70)

## 2018-11-29 LAB — LACTIC ACID, PLASMA: Lactic Acid, Venous: 1.6 mmol/L (ref 0.5–1.9)

## 2018-11-29 MED ORDER — SODIUM CHLORIDE 0.9 % IV BOLUS
1000.0000 mL | Freq: Once | INTRAVENOUS | Status: AC
  Administered 2018-11-29: 1000 mL via INTRAVENOUS

## 2018-11-29 MED ORDER — PHENYLEPHRINE HCL-NACL 10-0.9 MG/250ML-% IV SOLN
25.0000 ug/min | INTRAVENOUS | Status: DC
  Administered 2018-11-30: 35 ug/min via INTRAVENOUS
  Administered 2018-11-30 – 2018-12-01 (×2): 25 ug/min via INTRAVENOUS
  Administered 2018-12-01: 24 ug/min via INTRAVENOUS
  Administered 2018-12-02: 30 ug/min via INTRAVENOUS
  Administered 2018-12-02: 35 ug/min via INTRAVENOUS
  Administered 2018-12-02: 25 ug/min via INTRAVENOUS
  Administered 2018-12-03: 30 ug/min via INTRAVENOUS
  Administered 2018-12-04: 10 ug/min via INTRAVENOUS
  Filled 2018-11-29 (×16): qty 250

## 2018-11-29 MED ORDER — FUROSEMIDE 10 MG/ML IJ SOLN
10.0000 mg | Freq: Once | INTRAMUSCULAR | Status: AC
  Administered 2018-11-29: 10 mg via INTRAVENOUS
  Filled 2018-11-29: qty 2

## 2018-11-29 MED ORDER — LACTATED RINGERS IV BOLUS
1000.0000 mL | Freq: Once | INTRAVENOUS | Status: AC
  Administered 2018-11-29: 1000 mL via INTRAVENOUS

## 2018-11-29 MED ORDER — SODIUM CHLORIDE 0.9 % IV SOLN
250.0000 mL | INTRAVENOUS | Status: DC
  Administered 2018-11-29 – 2018-12-13 (×2): 250 mL via INTRAVENOUS

## 2018-11-29 NOTE — Consult Note (Signed)
NAME:  Becky Figueroa, MRN:  [id number], DOB:  [birth year], LOS: 3 ADMISSION DATE:  11/26/2018, CONSULTATION DATE:  2/1 REFERRING MD:  Dr Ashley Royalty, CHIEF COMPLAINT:  Septic Shock   Brief History   73 year old woman with Parkinson's disease, history right LE DVT, admitted with septic shock due to large decubitus ulcer and osteomyelitis.  History of present illness   73 year old woman with Parkinson's disease and dementia, depression, right lower extremity DVT on anticoagulation.  She has been dealing with a large sacral decubitus ulcer, receiving wound care as an outpatient.  She was admitted with lethargy, toxic metabolic encephalopathy in the setting of apparent severe sepsis and osteomyelitis.  MRI of the spine and sacrum 1/31 confirm the osteomyelitis and surrounding myositis.  She had acute renal insufficiency and hyponatremia which have improved with volume resuscitation.  Started on broad-spectrum antibiotics, now on cefepime alone.  She is 7.4 L positive for the admission so far.  2/1 she has developed progressive hypotension.  She received low-dose Lasix earlier in the day (only 10 mg).   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   Significant Hospital Events     Consults:  General surgery Wound care Infectious diseases Cardiology   Procedures:    Significant Diagnostic Tests:  Head CT 1/29 >> no acute abnormality MRI sacrum 1/31 >> osteomyelitis of the lower sacrum and coccyx associated with large decubitus ulcer, diffuse gluteal edema and presacral edema compatible with myositis.  No pyomyositis seen Echocardiogram 2/1 >>  normal left ventricular size and function, EF 60 to 65%, no pericardial effusion, normal valvular function  Micro Data:  Blood 1/29 >>   Antimicrobials:  Vancomycin 1/29 > 1/31 Cefepime 1/29 >>   Interim history/subjective:    Objective   Blood pressure (!) 77/46, pulse 71, temperature 97.9 F (36.6 C), temperature source Oral, resp. rate 18, height 5\' 6"  (1.676 m), weight 59 kg, SpO2 100 %.        Intake/Output Summary (Last 24 hours) at 11/29/2018 1623 Last data filed at 11/29/2018 1319 Gross per 24 hour  Intake 4345.97 ml  Output 3200 ml  Net 1145.97 ml   Filed Weights   11/26/18 2033 11/27/18 1400  Weight: 59 kg 59 kg    Examination: General: Weak ill appearing woman, lying comfortably in bed HENT: OP clear, padding on bridge of her nose Lungs: bilateral insp squeaks and crackles Cardiovascular: regular, borderline tachy, no M Abdomen: soft, non-distended, + BS Extremities: 1-2+ pitting edema Neuro: awake, globally weak, answers questions, follows commands.  Skin: WOC exam reviewed, not examined by me  Resolved Hospital Problem list   Acute renal failure Hyponatremia  Assessment & Plan:  Septic shock.  Source is sacral osteomyelitis with surrounding myositis due to decubitus ulcer. -Volume resuscitation as she can tolerate.  This been some concern that she may be developing evidence for pulmonary edema.  Lasix was given earlier 2/1 but the dose was low and I do not think it necessarily precipitated her current hypotension.  Hold off any further diuresis for now -Initiate phenylephrine via peripheral IV and follow for response.  He may ultimately require central venous access or PICC -Check lactic acid, procalcitonin -Antibiotics as recommended by infectious diseases.  Notes  indicate that she was going to change to Zosyn but currently she is still on cefepime, Vancomycin has been discontinued -Note CCS recommendation that she may require debridement.  Her  anticoagulation is being held to facilitate  Toxic metabolic encephalopathy due to sepsis and metabolic disarray -Supportive care, correct underlying contributors  History of right lower extremity DVT. -Has been treated with Eliquis, currently on hold to facilitate bedside debridement.  Covering with heparin infusion  History of Parkinson's and dementia -On Sinemet  Relative anemia - follow CBC   Best practice:  Diet: Regular Pain/Anxiety/Delirium protocol (if indicated): Ultram VAP protocol (if indicated): N/A DVT prophylaxis: Heparin drip GI prophylaxis: N/A Glucose control: NA Mobility: Bedrest Code Status: Full Family Communication:  Disposition: SDU, may need to move to ICU depending on persistence of her hypotension  Labs   CBC: Recent Labs  Lab 11/26/18 1232 11/27/18 0530 11/28/18 0243 11/29/18 0415  WBC 13.1* 10.1 5.8 17.0*  NEUTROABS 11.8*  --   --   --   HGB 10.9* 10.2* 8.8* 8.6*  HCT 33.3* 32.0* 28.1* 29.0*  MCV 95.1 96.1 100.0 87.1  PLT 127* 134* 125* 367    Basic Metabolic Panel: Recent Labs  Lab 11/26/18 1232 11/27/18 0530 11/28/18 0243 11/29/18 0415  NA 131* 130* 136 145  K 3.9 4.3 3.4* 3.8  CL 94* 100 113* 111  CO2 24 22 19* 24  GLUCOSE 137* 127* 101* 125*  BUN 53* 50* 27* 21  CREATININE 1.68* 1.53* 0.86 0.87  CALCIUM 8.2* 7.7* 6.7* 7.7*   GFR: Estimated Creatinine Clearance: 54.4 mL/min (by C-G formula based on SCr of 0.87 mg/dL). Recent Labs  Lab 11/26/18 1232 11/26/18 1502 11/27/18 0530 11/28/18 0243 11/29/18 0415  WBC 13.1*  --  10.1 5.8 17.0*  LATICACIDVEN  --  1.0  --   --   --     Liver Function Tests: Recent Labs  Lab 11/26/18 1232 11/27/18 0530  AST 37 47*  ALT 24 22  ALKPHOS 137* 123  BILITOT 1.2 1.0  PROT 6.6 6.1*  ALBUMIN 2.6* 2.3*   No results for input(s): LIPASE, AMYLASE in the last 168 hours. No results for input(s): AMMONIA in the last 168 hours.  ABG    Component Value Date/Time   PHART 7.452  (H) 11/26/2018 1430   PCO2ART 36.1 11/26/2018 1430   PO2ART 70.1 (L) 11/26/2018 1430   HCO3 24.8 11/26/2018 1430   TCO2 27 11/02/2018 1242   O2SAT 93.7 11/26/2018 1430     Coagulation Profile: No results for input(s): INR, PROTIME in the last 168 hours.  Cardiac Enzymes: No results for input(s): CKTOTAL, CKMB, CKMBINDEX, TROPONINI in the last 168 hours.  HbA1C: No results found for: HGBA1C  CBG: Recent Labs  Lab 11/26/18 1358  GLUCAP 121*    Review of Systems:   Denies pain. Marginal appetite. 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   Surgical History    Past Surgical History:  Procedure Laterality Date  . ANAL FISSURECTOMY  1994   and Hemorrhoidectomy  . BOTOX INJECTION N/A 11/18/2013   Procedure: BOTOX INJECTION;  Surgeon: Hart Carwinora M Brodie, MD;  Location: [facility];  Service: Endoscopy;  Laterality: N/A;  .  CHOLECYSTECTOMY OPEN  1982  . CYSTOSCOPY WITH RETROGRADE PYELOGRAM, URETEROSCOPY AND STENT PLACEMENT Left 06/06/2016   Procedure: CYSTOSCOPY WITH RETROGRADE PYELOGRAM, URETEROSCOPY AND STENT PLACEMENT;  Surgeon: Barron Alvineavid Grapey, MD;  [facility];  Service: Urology;  Laterality: Left;  . FLEXIBLE SIGMOIDOSCOPY N/A 11/18/2013   Procedure: FLEXIBLE SIGMOIDOSCOPY/ with BOTOX;  Surgeon: Hart Carwinora M Brodie, MD;  Location: [facility];  Service: Endoscopy;  Laterality: N/A;  . HOLMIUM LASER APPLICATION Left 06/06/2016   Procedure: HOLMIUM LASER APPLICATION;  Surgeon: Barron Alvineavid Grapey, MD;  [facility] ;  Service: Urology;  Laterality: Left;  . TONSILLECTOMY  age 73    . TOTAL ABDOMINAL HYSTERECTOMY W/ BILATERAL SALPINGOOPHORECTOMY  1984   and Appendectomy     Social History   reports that she has never smoked. She has never used smokeless tobacco. She reports that she does not drink alcohol or use drugs.   Family History   Her family history includes Breast cancer in her maternal aunt; Diabetes in her brother; Pancreatic cancer (age of onset: 5156) in her maternal uncle. There is no history of Colon cancer.   Allergies Allergies  Allergen Reactions  . Sinemet [Carbidopa W-Levodopa] Nausea And Vomiting    Able to tolerate with Zofran  . Sulfa Antibiotics Other (See Comments)    Flushing and hot     Home Medications  Prior to Admission medications   Medication Sig Start Date End Date Taking? Authorizing Provider  ALPRAZolam Prudy Feeler(XANAX) 0.5 MG tablet Take 1 tablet (0.5 mg total) by mouth 3 (three) times daily as needed for anxiety. 11/07/18  Yes Gwyneth SproutPlunkett, Whitney, MD  apixaban (ELIQUIS) 5 MG TABS tablet Take 10 mg by mouth 2 (two) times daily.   Yes [provider]  carbidopa-levodopa (SINEMET IR) 25-100 MG tablet 1/2 tablet twice a day for 3 weeks, then take 1/2 tablet three times a day Patient taking differently: Take 0.5 tablets by mouth 3 (three) times daily.  09/18/18  Yes York SpanielWillis, Charles K, MD  ciprofloxacin (CIPRO) 500 MG tablet Take 500 mg by mouth 2 (two) times daily.   Yes [provider]  lansoprazole (PREVACID) 30 MG capsule Take 30 mg by mouth every morning.    Yes [provider]  Multiple Vitamins-Minerals (ONE-A-DAY WOMENS 50 PLUS PO) Take 1 tablet by mouth daily.   Yes [provider]  Nutritional Supplements (RESOURCE 2.0) LIQD Take 120 mLs by mouth 2 (two) times daily.   Yes [provider]  NUTRITIONAL SUPPLEMENTS PO Take 1 Units by mouth daily at 12 noon. "Magic Cup."   Yes [provider]  ondansetron (ZOFRAN) 4 MG tablet Take 1 tablet (4 mg total) by mouth every 8 (eight) hours as  needed for nausea or vomiting. Patient taking differently: Take 4 mg by mouth every 8 (eight) hours. Takes with carbidopa-levodopa 01/16/18  Yes Huston FoleyAthar, Saima, MD  traMADol (ULTRAM) 50 MG tablet Take 50 mg by mouth every 6 (six) hours as needed for moderate pain.    Yes [provider]  fluconazole (DIFLUCAN) 100 MG tablet Take 1 tablet (100 mg total) by mouth daily. Patient not taking: Reported on 11/26/2018 11/06/18   Darlin DropHall, Carole N, DO     Critical care time: 3745     Levy Pupaobert Byrum, MD, PhD 11/29/2018, 5:02 PM [facility] [phone] or if no answer [phone]

## 2018-11-29 NOTE — Progress Notes (Signed)
Pt. Husband arrived on the unit accompanied by security requesting to know why "my wife can't have flowers." RN and Research scientist (medical) discussed with patients husband that the patients son requested the flowers be removed from the patients room.  RN attempted to clarify with Mr. Pollinger about who was designated to make the medical decisions for his wife as he is the patients husband and there is currently no POA on file.  Mr. Fracassi stated "her son is in charge of all that."  RN clarified with  Mr. Guerriero if that meant he was not going to make medical decisions for his wife.  Mr. Korth stated "I'm letting him do all that" referencing patients son.  Charge RN Christian asked if Mr. Hieronymus would like to see his wife and he stated "no I don't want to upset her."  The patients flowers were then given to Mr. Smyser and he left with security.

## 2018-11-29 NOTE — Progress Notes (Signed)
  Echocardiogram 2D Echocardiogram has been performed.  Celene Skeen 11/29/2018, 10:42 AM

## 2018-11-29 NOTE — Progress Notes (Signed)
Physical Therapy Wound Treatment Patient Details  Name: Becky Figueroa MRN: [id number] Date of Birth: [birth year]  Today's Date: 11/29/2018 Time: 7782-4235 Time Calculation (min): 34 min  Subjective  Subjective: "ohhh, please stop."  pt only able to have tylenol due to low BP.  breaks for pain provided. Patient and Family Stated Goals: Pt unable to state, hx of dementia Date of Onset: (present on admission) Prior Treatments: wound center, s/p bedside debridement 11/27/2018  Pain Score: Pain Score: pt unable to rate but reports pain, premedicated with tylenol (see above), rest breaks as needed, repositioned end of session  Wound Assessment    11/29/18 1519  Subjective Assessment  Subjective "ohhh, please stop."  pt only able to have tylenol due to low BP.  breaks for pain provided.  Patient and Family Stated Goals Pt unable to state, hx of dementia  Date of Onset  (present on admission)  Prior Treatments wound center, s/p bedside debridement 11/27/2018  Evaluation and Treatment  Evaluation and Treatment Procedures Explained to Patient/Family Yes  Evaluation and Treatment Procedures Patient unable to consent due to mental status  Pressure Injury 11/28/18 Unstageable - Full thickness tissue loss in which the base of the ulcer is covered by slough (yellow, tan, gray, green or brown) and/or eschar (tan, brown or black) in the wound bed. HYDROTHERAPY  Date First Assessed/Time First Assessed: 11/28/18 1330   Location: Sacrum  Location Orientation: Mid  Staging: Unstageable - Full thickness tissue loss in which the base of the ulcer is covered by slough (yellow, tan, gray, green or brown) and/or esch...  Dressing Type Moist to dry;Foam;Gauze (Comment)  Dressing Changed  Dressing Change Frequency Twice a day  State of Healing Eschar  Site / Wound Assessment Black;Red;Purple;Painful  % Wound base Red or Granulating 20%  % Wound base Black/Eschar 80%  Peri-wound Assessment Purple  Margins  Unattached edges (unapproximated)  Drainage Amount Moderate  Drainage Description Odor;Purulent  Treatment Debridement (Selective);Hydrotherapy (Pulse lavage);Packing (Saline gauze)  Hydrotherapy  Pulsed lavage therapy - wound location sacral injury  Pulsed Lavage with Suction (psi) 8 psi  Pulsed Lavage with Suction - Normal Saline Used 1000 mL  Pulsed Lavage Tip Tip with splash shield  Selective Debridement  Selective Debridement - Location sacral injury  Selective Debridement - Tools Used Scissors;Forceps  Selective Debridement - Tissue Removed black nonviable tissue  Wound Therapy - Assess/Plan/Recommendations  Wound Therapy - Clinical Statement Pt admitted with unstageable sacral pressure injury and s/p bedside debridement 11/27/2018.  Possible plans to take pt to OR when available.  Requested Dakins solution (also in [place] note) for wound at this time and PA aware however was not ordered.  Plan to continue with hydrotherapy to assist with removing nonviable tissue.  Wound Therapy - Functional Problem List Large amount of black nonviable tissue still present in wound bed.  Will continue to assist with hydrotherapy.  Plan is for further surgical debridement as well.  Factors Delaying/Impairing Wound Healing Infection - systemic/local;Immobility;Multiple medical problems (osteomyelitis of the sacrum per MRI)  Hydrotherapy Plan Patient/family education;Dressing change;Debridement;Pulsatile lavage with suction  Wound Therapy - Frequency 6X / week  Wound Therapy - Follow Up Recommendations Skilled nursing facility  Wound Plan Will continue to assist with above plan to decrease necrotic nonviable tissue in this unstageable pressure injury of sacrum  Wound Therapy Goals - Improve the function of patient's integumentary system by progressing the wound(s) through the phases of wound healing by:  Decrease Necrotic Tissue to 40  Decrease Necrotic Tissue -  Progress Progressing toward goal  Increase  Granulation Tissue to 60  Increase Granulation Tissue - Progress Progressing toward goal  Improve Drainage Characteristics Mod  Improve Drainage Characteristics - Progress Progressing toward goal  Patient/Family will be able to  Family able to recall good repositioning techniques.  Goals/treatment plan/discharge plan were made with and agreed upon by patient/family Yes  Time For Goal Achievement 2 weeks  Wound Therapy - Potential for Goals Fair                                                                                                                                                                                                                                               Wound / Incision (Open or Dehisced)  (Active)                        Hydrotherapy Pulsed lavage therapy - wound location: sacral injury Pulsed Lavage with Suction (psi): 8 psi Pulsed Lavage with Suction - Normal Saline Used: 1000 mL Pulsed Lavage Tip: Tip with splash shield Selective Debridement Selective Debridement - Location: sacral injury Selective Debridement - Tools Used: Scissors;Forceps Selective Debridement - Tissue Removed: black nonviable tissue   Wound Assessment and Plan  Wound Therapy - Assess/Plan/Recommendations Wound Therapy - Clinical Statement: Pt admitted with unstageable sacral pressure injury and s/p bedside debridement 11/27/2018.  Possible plans to take pt to OR when available.  Requested Dakins solution (also in [place] note) for wound at this time and PA aware however was not ordered.  Plan to continue with hydrotherapy to assist with removing nonviable tissue. Wound Therapy - Functional Problem List: Large amount of black nonviable tissue still present in wound bed.  Will continue to assist with hydrotherapy.  Plan is for further surgical debridement as well. Factors Delaying/Impairing Wound Healing: Infection -  systemic/local;Immobility;Multiple medical problems(osteomyelitis of the sacrum per MRI) Hydrotherapy Plan: Patient/family education;Dressing change;Debridement;Pulsatile lavage with suction Wound Therapy - Frequency: 6X / week Wound Therapy - Follow Up Recommendations: Skilled nursing facility Wound Plan: Will continue to assist with above plan to decrease necrotic nonviable tissue in this unstageable pressure injury of sacrum  Wound Therapy Goals- Improve the function of patient's integumentary system by progressing the wound(s) through the phases of wound healing (inflammation - proliferation - remodeling) by: Decrease Necrotic Tissue to: 40 Decrease Necrotic Tissue - Progress:  Progressing toward goal Increase Granulation Tissue to: 60 Increase Granulation Tissue - Progress: Progressing toward goal Improve Drainage Characteristics: Mod Improve Drainage Characteristics - Progress: Progressing toward goal Patient/Family will be able to : Family able to recall good repositioning techniques. Goals/treatment plan/discharge plan were made with and agreed upon by patient/family: Yes Time For Goal Achievement: 2 weeks Wound Therapy - Potential for Goals: Fair  Goals will be updated until maximal potential achieved or discharge criteria met.  Discharge criteria: when goals achieved, discharge from hospital, MD decision/surgical intervention, no progress towards goals, refusal/missing three consecutive treatments without notification or medical reason.      Carmelia Bake, PT, DPT Acute Rehabilitation Services Office: [phone] Pager: [phone]  Trena Platt 11/29/2018, 3:27 PM
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
## 2018-11-29 NOTE — Progress Notes (Signed)
ANTICOAGULATION CONSULT NOTE - Follow Up Consult  Pharmacy Consult for Heparin Indication: DVT (PTA apixaban on hold)  Allergies  Allergen Reactions  . Sinemet [Carbidopa W-Levodopa] Nausea And Vomiting    Able to tolerate with Zofran  . Sulfa Antibiotics Other (See Comments)    Flushing and hot    Patient Measurements: Height: 5\' 6"  (167.6 cm) Weight: 130 lb 1.1 oz (59 kg) IBW/kg (Calculated) : 59.3 Heparin Dosing Weight: TBW  Vital Signs: Temp: 99.2 F (37.3 C) (02/01 0800) Temp Source: Oral (02/01 0800) BP: 96/44 (02/01 0800) Pulse Rate: 70 (02/01 0800)  Labs: Recent Labs    11/27/18 0530 11/27/18 2201 11/28/18 0243 11/28/18 1001 11/29/18 0415 11/29/18 0557  HGB 10.2*  --  8.8*  --  8.6*  --   HCT 32.0*  --  28.1*  --  29.0*  --   PLT 134*  --  125*  --  367  --   APTT 78* 92* 124* 114*  --   --   HEPARINUNFRC 1.82*  --  0.62 0.36  --  0.32  CREATININE 1.53*  --  0.86  --  0.87  --     Estimated Creatinine Clearance: 54.4 mL/min (by C-G formula based on SCr of 0.87 mg/dL).   Medications:  Infusions:  . sodium chloride 100 mL/hr at 11/29/18 0534  . ceFEPime (MAXIPIME) IV Stopped (11/29/18 0527)  . heparin 1,000 Units/hr (11/29/18 0400)  . sodium chloride    . vancomycin      Assessment: 24 yoF admitted on 1/29 with osteomyelitis of sacral decubitus ulcer with surgical consult for debridement. Pt was recently diagnosed with RLE DVT and started on Eliquis. Pharmacy is consulted to dose Heparin IV while PTA Eliquis for DVT is on hold.   Significant events: 1/29 Last apixaban dose taken PTA 1/30 Heparin held at 08:00 for debridement, resumed heparin s/p procedure Evening of 1/31-2/1 pt pulled out line while in MRI, heparin off ~ 1-2 hours  Today, 11/29/2018:  Heparin level 0.32, therapeutic on heparin at 1000 units/hr  APTT 114, therapeutic and correlating with heparin level (1/31)  CBC:  Hgb decreased to 8.6, Plt increased to 367.  SCr  significantly improved to 0.87  No bleeding or complications reported.   Goal of Therapy:  Heparin level 0.3-0.7 units/ml aPTT 66-102 seconds Monitor platelets by anticoagulation protocol: Yes   Plan:   Continue heparin IV infusion at 1000 units/hr  Daily heparin level and CBC  Follow up plans for further debridement (hold heparin 4 hours prior to OR if further I&D planned)   Arley Phenix RPh 11/29/2018, 9:08 AM Pager [phone]

## 2018-11-30 LAB — BASIC METABOLIC PANEL
Anion gap: 6 (ref 5–15)
BUN: 10 mg/dL (ref 8–23)
CHLORIDE: 112 mmol/L — AB (ref 98–111)
CO2: 18 mmol/L — AB (ref 22–32)
Calcium: 6.7 mg/dL — ABNORMAL LOW (ref 8.9–10.3)
Creatinine, Ser: 0.49 mg/dL (ref 0.44–1.00)
GFR calc Af Amer: >60 mL/min (ref >60)
GFR calc non Af Amer: >60 mL/min (ref >60)
Glucose, Bld: 94 mg/dL (ref 70–99)
Potassium: 2.5 mmol/L — CL (ref 3.5–5.1)
SODIUM: 136 mmol/L (ref 135–145)

## 2018-11-30 LAB — CBC
HCT: 28.8 % — ABNORMAL LOW (ref 36.0–46.0)
HEMOGLOBIN: 9.1 g/dL — AB (ref 12.0–15.0)
MCH: 31 pg (ref 26.0–34.0)
MCHC: 31.6 g/dL (ref 30.0–36.0)
MCV: 98 fL (ref 80.0–100.0)
Platelets: 122 10*3/uL — ABNORMAL LOW (ref 150–400)
RBC: 2.94 MIL/uL — ABNORMAL LOW (ref 3.87–5.11)
RDW: 14 % (ref 11.5–15.5)
WBC: 3.9 10*3/uL — ABNORMAL LOW (ref 4.0–10.5)
nRBC: 0 % (ref 0.0–0.2)

## 2018-11-30 LAB — PROCALCITONIN: Procalcitonin: 0.32 ng/mL

## 2018-11-30 LAB — HEPARIN LEVEL (UNFRACTIONATED)
HEPARIN UNFRACTIONATED: 0.27 [IU]/mL — AB (ref 0.30–0.70)
Heparin Unfractionated: 0.24 IU/mL — ABNORMAL LOW (ref 0.30–0.70)

## 2018-11-30 MED ORDER — POTASSIUM CHLORIDE CRYS ER 20 MEQ PO TBCR
40.0000 meq | EXTENDED_RELEASE_TABLET | Freq: Three times a day (TID) | ORAL | Status: DC
  Administered 2018-11-30: 40 meq via ORAL
  Filled 2018-11-30: qty 2

## 2018-11-30 MED ORDER — SODIUM CHLORIDE 0.9% FLUSH
10.0000 mL | INTRAVENOUS | Status: DC | PRN
  Administered 2018-12-08 – 2018-12-09 (×2): 10 mL
  Filled 2018-11-30 (×2): qty 40

## 2018-11-30 MED ORDER — SODIUM CHLORIDE 0.9% FLUSH
10.0000 mL | Freq: Two times a day (BID) | INTRAVENOUS | Status: DC
  Administered 2018-11-30 – 2018-12-01 (×3): 10 mL
  Administered 2018-12-01: 40 mL
  Administered 2018-12-02: 10 mL
  Administered 2018-12-02: 20 mL
  Administered 2018-12-03 – 2018-12-08 (×11): 10 mL
  Administered 2018-12-09: 20 mL
  Administered 2018-12-09: 40 mL
  Administered 2018-12-10 – 2018-12-16 (×12): 10 mL

## 2018-11-30 MED ORDER — CHLORHEXIDINE GLUCONATE CLOTH 2 % EX PADS
6.0000 | MEDICATED_PAD | Freq: Every day | CUTANEOUS | Status: DC
  Administered 2018-11-30 – 2018-12-15 (×10): 6 via TOPICAL

## 2018-11-30 MED ORDER — POTASSIUM CHLORIDE 10 MEQ/100ML IV SOLN
10.0000 meq | INTRAVENOUS | Status: AC
  Administered 2018-11-30 (×4): 10 meq via INTRAVENOUS
  Filled 2018-11-30 (×4): qty 100

## 2018-11-30 MED ORDER — MAGNESIUM SULFATE 2 GM/50ML IV SOLN
2.0000 g | Freq: Once | INTRAVENOUS | Status: AC
  Administered 2018-11-30: 2 g via INTRAVENOUS
  Filled 2018-11-30: qty 50

## 2018-11-30 MED ORDER — HEPARIN (PORCINE) 25000 UT/250ML-% IV SOLN
1250.0000 [IU]/h | INTRAVENOUS | Status: AC
  Administered 2018-11-30 – 2018-12-01 (×3): 1250 [IU]/h via INTRAVENOUS
  Filled 2018-11-30 (×3): qty 250

## 2018-11-30 MED ORDER — HEPARIN BOLUS VIA INFUSION
900.0000 [IU] | Freq: Once | INTRAVENOUS | Status: AC
  Administered 2018-11-30: 900 [IU] via INTRAVENOUS
  Filled 2018-11-30: qty 900

## 2018-11-30 MED ORDER — PIPERACILLIN-TAZOBACTAM 3.375 G IVPB
3.3750 g | Freq: Three times a day (TID) | INTRAVENOUS | Status: DC
  Administered 2018-11-30 – 2018-12-16 (×47): 3.375 g via INTRAVENOUS
  Filled 2018-11-30 (×50): qty 50

## 2018-11-30 NOTE — Progress Notes (Signed)
Peripherally Inserted Central Catheter/Midline Placement  The IV Nurse has discussed with the patient and/or persons authorized to consent for the patient, the purpose of this procedure and the potential benefits and risks involved with this procedure.  The benefits include less needle sticks, lab draws from the catheter, and the patient may be discharged home with the catheter. Risks include, but not limited to, infection, bleeding, blood clot (thrombus formation), and puncture of an artery; nerve damage and irregular heartbeat and possibility to perform a PICC exchange if needed/ordered by physician.  Alternatives to this procedure were also discussed.  Bard Power PICC patient education guide, fact sheet on infection prevention and patient information card has been provided to patient /or left at bedside.  Consent obtained via telephone, as patient is confused.   PICC/Midline Placement Documentation  PICC Double Lumen 11/30/18 PICC Right Brachial 37 cm 0 cm (Active)  Indication for Insertion or Continuance of Line Vasoactive infusions 11/30/2018  9:00 AM  Exposed Catheter (cm) 0 cm 11/30/2018  9:00 AM  Site Assessment Clean;Dry;Intact 11/30/2018  9:00 AM  Lumen #1 Status Flushed;Blood return noted;Saline locked 11/30/2018  9:00 AM  Lumen #2 Status Flushed;Blood return noted;Saline locked 11/30/2018  9:00 AM  Dressing Type Transparent 11/30/2018  9:00 AM  Dressing Status Clean;Dry;Intact;Antimicrobial disc in place 11/30/2018  9:00 AM  Line Care Connections checked and tightened 11/30/2018  9:00 AM  Dressing Change Due 12/07/18 11/30/2018  9:00 AM       Morrell Riddle, Arnetha Massy 11/30/2018, 9:45 AM

## 2018-11-30 NOTE — Progress Notes (Signed)
PCCM Interval Note  Brief follow-up.  Patient never had to start phenylephrine, responded to volume resuscitation and stimulation.  No evidence for lactic acidosis or evolving renal failure.  She does have hypokalemia.  A PICC line is being placed now to facilitate antibiotics and pressors if needed, especially if she has surgical debridement and require sedation.  We will check on her tomorrow to ensure stability.  Levy Pupa, MD, PhD 11/30/2018, 9:22 AM [facility] [phone] or if no answer [phone]

## 2018-11-30 NOTE — Progress Notes (Signed)
Pharmacy Antibiotic Note  Becky Figueroa is a 73 y.o. female admitted on 11/26/2018 from wound care center with worsening decubitus ulcer, Xray concerning for osteomyelitis.  S/p surgical debridement on 1/30.  Previously on Cefepime and Vancomycin. ID now prefers Zosyn.  Today, 11/30/2018:  Afebrile  WBC now low  AKI resolved   Plan:  Zosyn 3.375g IV q8 hr  Pharmacy will sign off, following peripherally for renal adjustments and culture results   Height: 5\' 6"  (167.6 cm) Weight: 130 lb 1.1 oz (59 kg) IBW/kg (Calculated) : 59.3  Temp (24hrs), Avg:98.1 F (36.7 C), Min:97.9 F (36.6 C), Max:98.4 F (36.9 C)  Recent Labs  Lab 11/26/18 1232 11/26/18 1502 11/27/18 0530 11/28/18 0243 11/29/18 0415 11/29/18 1722 11/30/18 0334  WBC 13.1*  --  10.1 5.8 17.0*  --  3.9*  CREATININE 1.68*  --  1.53* 0.86 0.87  --  0.49  LATICACIDVEN  --  1.0  --   --   --  1.6  --     Estimated Creatinine Clearance: 59.2 mL/min (by C-G formula based on SCr of 0.49 mg/dL).    Allergies  Allergen Reactions  . Sinemet [Carbidopa W-Levodopa] Nausea And Vomiting    Able to tolerate with Zofran  . Sulfa Antibiotics Other (See Comments)    Flushing and hot    Antimicrobials this admission:  PTA cipro 1/28 >> (7 days Rx) 1/29 Vancomycin >> 2/1 1/29 Cefepime >>   Dose adjustments this admission:  1/31 Vancomycin, Cefepime empirically adjusted for renal function  Microbiology results:  1/23 Sacral wound Cx: E coli R-amp, unasyn, ancef 1/29 JOA:CZYS 1/30 MRSA PCR: neg  Thank you for allowing pharmacy to be a part of this patient's care.  Bernadene Person, PharmD, BCPS [phone] 11/30/2018, 11:32 AM

## 2018-11-30 NOTE — Progress Notes (Signed)
CRITICAL VALUE ALERT  Critical Value:  Potassium 2.5  Date & Time Notied:  0330 11/30/18  Provider Notified: Dr. Rana Snare   Orders Received/Actions taken: 40 meq potassium given po

## 2018-11-30 NOTE — Progress Notes (Signed)
Pharmacy: Re- heparin  Patient's a 73 y.o F with sacral decubitus ulcer and recent DVT currently on heparin drip, while PTA Eliquis is on hold, in case surgical debridement is needed for wound.  Heparin level is sub-therapeutic at 0.27 (goal 0.3-0.7) with rate increased to 1150 units/hr earlier today.  - PICC placed ~10a. Per RN, heparin drip was not hold during PICC placement - no bleeding issues noted   Plan: - increase heparin drip to 1250 units/hr - check 8 hr heparin level - monitor for s/s bleeding  Dorna Leitz, PharmD, BCPS 11/30/2018 4:47 PM

## 2018-11-30 NOTE — Progress Notes (Signed)
ANTICOAGULATION CONSULT NOTE - Follow Up Consult  Pharmacy Consult for Heparin Indication: DVT (PTA apixaban on hold)  Allergies  Allergen Reactions  . Sinemet [Carbidopa W-Levodopa] Nausea And Vomiting    Able to tolerate with Zofran  . Sulfa Antibiotics Other (See Comments)    Flushing and hot    Patient Measurements: Height: 5\' 6"  (167.6 cm) Weight: 130 lb 1.1 oz (59 kg) IBW/kg (Calculated) : 59.3 Heparin Dosing Weight: TBW  Vital Signs: Temp: 98.3 F (36.8 C) (02/02 0358) Temp Source: Oral (02/02 0358) BP: 104/46 (02/02 0701) Pulse Rate: 89 (02/02 0701)  Labs: Recent Labs    11/27/18 2201  11/28/18 0243 11/28/18 1001 11/29/18 0415 11/29/18 0557 11/30/18 0334  HGB  --    < > 8.8*  --  8.6*  --  9.1*  HCT  --   --  28.1*  --  29.0*  --  28.8*  PLT  --   --  125*  --  367  --  122*  APTT 92*  --  124* 114*  --   --   --   HEPARINUNFRC  --    < > 0.62 0.36  --  0.32 0.24*  CREATININE  --   --  0.86  --  0.87  --  0.49   < > = values in this interval not displayed.    Estimated Creatinine Clearance: 59.2 mL/min (by C-G formula based on SCr of 0.49 mg/dL).   Medications:  Infusions:  . sodium chloride 100 mL/hr at 11/30/18 0700  . sodium chloride Stopped (11/29/18 1737)  . ceFEPime (MAXIPIME) IV Stopped (11/30/18 0556)  . heparin 1,000 Units/hr (11/30/18 0700)  . magnesium sulfate 1 - 4 g bolus IVPB 50 mL/hr at 11/30/18 0700  . phenylephrine (NEO-SYNEPHRINE) Adult infusion    . potassium chloride 100 mL/hr at 11/30/18 0645  . sodium chloride      Assessment: 28 yoF admitted on 1/29 with osteomyelitis of sacral decubitus ulcer with surgical consult for debridement. Pt was recently diagnosed with RLE DVT and started on Eliquis. Pharmacy is consulted to dose Heparin IV while PTA Eliquis for DVT is on hold.   Significant events: 1/29 Last apixaban dose taken PTA 1/30 Heparin held at 08:00 for debridement, resumed heparin s/p procedure Evening of 1/31-2/1  pt pulled out line while in MRI, heparin off ~ 1-2 hours  Today, 11/30/2018:  Heparin level 0.24, subtherapeutic on heparin at 1000 units/hr  APTT 114, therapeutic and correlating with heparin level (1/31)  CBC:  Hgb increased to 9.1, Plt decreased to 122  SCr significantly improved to 0.49  No bleeding or complications per RN   Goal of Therapy:  Heparin level 0.3-0.7 units/ml aPTT 66-102 seconds Monitor platelets by anticoagulation protocol: Yes   Plan:   Bolus heparin 900 units x 1  Increase heparin drip to 1150 units/hr  Heparin level in 8 hours  Daily heparin level and CBC  Follow up plans for further debridement (hold heparin 4 hours prior to OR if further I&D planned)   Arley Phenix RPh 11/30/2018, 7:08 AM Pager [phone]
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
## 2018-12-01 DIAGNOSIS — I96 Gangrene, not elsewhere classified: Secondary | ICD-10-CM

## 2018-12-01 DIAGNOSIS — B966 Bacteroides fragilis [B. fragilis] as the cause of diseases classified elsewhere: Secondary | ICD-10-CM

## 2018-12-01 DIAGNOSIS — R6521 Severe sepsis with septic shock: Secondary | ICD-10-CM

## 2018-12-01 DIAGNOSIS — L89156 Pressure-induced deep tissue damage of sacral region: Secondary | ICD-10-CM

## 2018-12-01 DIAGNOSIS — M4628 Osteomyelitis of vertebra, sacral and sacrococcygeal region: Secondary | ICD-10-CM

## 2018-12-01 DIAGNOSIS — G9341 Metabolic encephalopathy: Secondary | ICD-10-CM

## 2018-12-01 DIAGNOSIS — B9620 Unspecified Escherichia coli [E. coli] as the cause of diseases classified elsewhere: Secondary | ICD-10-CM

## 2018-12-01 DIAGNOSIS — K5900 Constipation, unspecified: Secondary | ICD-10-CM

## 2018-12-01 DIAGNOSIS — Z95828 Presence of other vascular implants and grafts: Secondary | ICD-10-CM

## 2018-12-01 LAB — HEPARIN LEVEL (UNFRACTIONATED)
Heparin Unfractionated: 0.33 IU/mL (ref 0.30–0.70)
Heparin Unfractionated: 0.34 IU/mL (ref 0.30–0.70)

## 2018-12-01 LAB — COMPREHENSIVE METABOLIC PANEL
ALK PHOS: 133 U/L — AB (ref 38–126)
ALT: 29 U/L (ref 0–44)
AST: 82 U/L — ABNORMAL HIGH (ref 15–41)
Albumin: 1.5 g/dL — ABNORMAL LOW (ref 3.5–5.0)
Anion gap: 4 — ABNORMAL LOW (ref 5–15)
BUN: 10 mg/dL (ref 8–23)
CALCIUM: 6.7 mg/dL — AB (ref 8.9–10.3)
CO2: 19 mmol/L — ABNORMAL LOW (ref 22–32)
CREATININE: 0.48 mg/dL (ref 0.44–1.00)
Chloride: 114 mmol/L — ABNORMAL HIGH (ref 98–111)
GFR calc Af Amer: >60 mL/min (ref >60)
GFR calc non Af Amer: >60 mL/min (ref >60)
Glucose, Bld: 98 mg/dL (ref 70–99)
Potassium: 2.9 mmol/L — ABNORMAL LOW (ref 3.5–5.1)
SODIUM: 137 mmol/L (ref 135–145)
Total Bilirubin: 0.3 mg/dL (ref 0.3–1.2)
Total Protein: 4.5 g/dL — ABNORMAL LOW (ref 6.5–8.1)

## 2018-12-01 LAB — SEDIMENTATION RATE: SED RATE: 25 mm/h — AB (ref 0–22)

## 2018-12-01 LAB — CORTISOL: CORTISOL PLASMA: 15.8 ug/dL

## 2018-12-01 LAB — CBC
HCT: 28.8 % — ABNORMAL LOW (ref 36.0–46.0)
Hemoglobin: 9.2 g/dL — ABNORMAL LOW (ref 12.0–15.0)
MCH: 30.5 pg (ref 26.0–34.0)
MCHC: 31.9 g/dL (ref 30.0–36.0)
MCV: 95.4 fL (ref 80.0–100.0)
NRBC: 0 % (ref 0.0–0.2)
Platelets: 155 10*3/uL (ref 150–400)
RBC: 3.02 MIL/uL — ABNORMAL LOW (ref 3.87–5.11)
RDW: 14.2 % (ref 11.5–15.5)
WBC: 5.3 10*3/uL (ref 4.0–10.5)

## 2018-12-01 LAB — CULTURE, BLOOD (ROUTINE X 2)
Culture: NO GROWTH
Culture: NO GROWTH
Special Requests: ADEQUATE
Special Requests: ADEQUATE

## 2018-12-01 LAB — C-REACTIVE PROTEIN: CRP: 4.7 mg/dL — ABNORMAL HIGH (ref <1.0)

## 2018-12-01 LAB — PROCALCITONIN: Procalcitonin: 0.37 ng/mL

## 2018-12-01 MED ORDER — POTASSIUM CHLORIDE 10 MEQ/100ML IV SOLN
10.0000 meq | INTRAVENOUS | Status: AC
  Administered 2018-12-01 (×4): 10 meq via INTRAVENOUS
  Filled 2018-12-01 (×4): qty 100

## 2018-12-01 MED ORDER — HYDROCORTISONE NA SUCCINATE PF 100 MG IJ SOLR
50.0000 mg | Freq: Four times a day (QID) | INTRAMUSCULAR | Status: DC
  Administered 2018-12-01 – 2018-12-11 (×40): 50 mg via INTRAVENOUS
  Filled 2018-12-01 (×40): qty 2

## 2018-12-01 MED ORDER — MAGNESIUM SULFATE 2 GM/50ML IV SOLN
2.0000 g | Freq: Once | INTRAVENOUS | Status: AC
  Administered 2018-12-01: 2 g via INTRAVENOUS
  Filled 2018-12-01: qty 50

## 2018-12-01 MED ORDER — POTASSIUM CHLORIDE CRYS ER 20 MEQ PO TBCR
40.0000 meq | EXTENDED_RELEASE_TABLET | Freq: Once | ORAL | Status: AC
  Administered 2018-12-01: 40 meq via ORAL
  Filled 2018-12-01: qty 2

## 2018-12-01 NOTE — Care Management Note (Signed)
Case Management Note  Patient Details  Name: NYELA SLUYTER MRN: [id number] Date of Birth: 05-Oct-1946  Subjective/Objective:                  Discharge Readiness Return to top of Sepsis and Other Febrile Illness, without Focal Infection RRG - ISC  Discharge readiness is indicated by patient meeting Recovery Milestones, including ALL of the following: ? Hemodynamic stability YES ON PRESSORS ? Fever absent or reduced YES ? Hypoxemia absent YES ? Mental status at baseline YES ? End-organ dysfunction (eg, myocardial ischemia, renal failure) absent YES ? Metabolic derangement (eg, dehydration, acidosis) absent K+=2.9 ? Cultures negative or infection identified and under adequate treatment YES ? Ambulatory YES ? Oral hydration, medications[P]  ? IV NEO SYNEPHRINE, IV HEPARIN, IV ZOSYN ? LEVEL OF CARE ICU NOT READEY FOR NEXT LEVEL OF CARE   Action/Plan: Following for progression of care. Following for cm needs none present at this time. Expected Discharge Date:  (unknown)               Expected Discharge Plan:     In-House Referral:     Discharge planning Services     Post Acute Care Choice:    Choice offered to:     DME Arranged:    DME Agency:     HH Arranged:    HH Agency:     Status of Service:     If discussed at [employer], dates discussed:    Additional Comments:  Golda Acre, RN 12/01/2018, 11:17 AM

## 2018-12-01 NOTE — Progress Notes (Signed)
   12/01/18 1457 Hydrotherapy treatment note  Subjective Assessment  Subjective "ohhh, please stop."  pt only able to have tylenol due to low BP.  breaks for pain provided.  Patient and Family Stated Goals Pt unable to state, hx of dementia  Date of Onset  (present on admission)  Prior Treatments wound center, s/p bedside debridement 11/27/2018  Evaluation and Treatment  Evaluation and Treatment Procedures Explained to Patient/Family Yes  Evaluation and Treatment Procedures Patient unable to consent due to mental status  Pressure Injury 11/28/18 Unstageable - Full thickness tissue loss in which the base of the ulcer is covered by slough (yellow, tan, gray, green or brown) and/or eschar (tan, brown or black) in the wound bed. HYDROTHERAPY  Date First Assessed/Time First Assessed: 11/28/18 1330   Location: Sacrum  Location Orientation: Mid  Staging: Unstageable - Full thickness tissue loss in which the base of the ulcer is covered by slough (yellow, tan, gray, green or brown) and/or esch...  Dressing Type Moist to dry;Foam;Gauze (Comment)  Dressing Changed  Dressing Change Frequency Twice a day  State of Healing Eschar  Site / Wound Assessment Black;Red;Purple;Painful  % Wound base Red or Granulating 20%  % Wound base Black/Eschar 75%  % Wound base Other/Granulation Tissue (Comment) 5% (bone)  Peri-wound Assessment Purple  Wound Length (cm) 8 cm  Wound Width (cm) 11 cm  Wound Depth (cm)  (unable to determine with nonvialble ts. in bed)  Wound Surface Area (cm^2) 88 cm^2  Undermining (cm) 3 (from 12--5 oclock and at 10:00)  Margins Unattached edges (unapproximated)  Drainage Amount Moderate  Drainage Description Odor;Purulent  Treatment Hydrotherapy (Pulse lavage);Packing (Saline gauze)  Hydrotherapy  Pulsed lavage therapy - wound location sacral injury  Pulsed Lavage with Suction (psi) 8 psi  Pulsed Lavage with Suction - Normal Saline Used 1000 mL  Pulsed Lavage Tip Tip with splash  shield  Selective Debridement  Selective Debridement - Tissue Removed  (pt to go to surgery for further debridement)  Wound Therapy - Assess/Plan/Recommendations  Wound Therapy - Clinical Statement Pt admitted with unstageable sacral pressure injury and s/p bedside debridement 11/27/2018.  CCS to return to surgery in 1-2 days for debridement. Current wound has black strungy slough.  Plan to continue with hydrotherapy to assist with removing nonviable tissue.  Wound Therapy - Functional Problem List bed bound, incontinent B/B  Factors Delaying/Impairing Wound Healing Infection - systemic/local;Immobility;Multiple medical problems (osteomyelitis of the sacrum per MRI)  Hydrotherapy Plan Patient/family education;Dressing change;Debridement;Pulsatile lavage with suction  Wound Therapy - Frequency 6X / week  Wound Therapy - Follow Up Recommendations Skilled nursing facility  Wound Plan Will continue to assist with above plan to decrease necrotic nonviable tissue in this unstageable pressure injury of sacrum  Wound Therapy Goals - Improve the function of patient's integumentary system by progressing the wound(s) through the phases of wound healing by:  Decrease Necrotic Tissue to 40  Decrease Necrotic Tissue - Progress Progressing toward goal  Increase Granulation Tissue to 60  Increase Granulation Tissue - Progress Progressing toward goal  Improve Drainage Characteristics Mod  Improve Drainage Characteristics - Progress Progressing toward goal  Patient/Family will be able to  Family able to recall good repositioning techniques.  Goals/treatment plan/discharge plan were made with and agreed upon by patient/family Yes  Time For Goal Achievement 2 weeks  Wound Therapy - Potential for Goals Fair  Blanchard Kelch PT Acute Rehabilitation Services Pager [phone] Office [phone]
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
## 2018-12-01 NOTE — Progress Notes (Signed)
NAME:  Becky Figueroa, MRN:  [id number], DOB:  09-Jul-1946, LOS: 5 ADMISSION DATE:  11/26/2018, CONSULTATION DATE:  2/1 REFERRING MD:  Dr Ashley Royalty, CHIEF COMPLAINT:  Septic Shock   Brief History   73 year old woman with Parkinson's disease, history right LE DVT, admitted with septic shock due to large decubitus ulcer and osteomyelitis.  History of present illness   73 year old woman with Parkinson's disease and dementia, depression, right lower extremity DVT on anticoagulation.  She has been dealing with a large sacral decubitus ulcer, receiving wound care as an outpatient.  She was admitted with lethargy, toxic metabolic encephalopathy in the setting of apparent severe sepsis and osteomyelitis.  MRI of the spine and sacrum 1/31 confirm the osteomyelitis and surrounding myositis.  She had acute renal insufficiency and hyponatremia which have improved with volume resuscitation.  Started on broad-spectrum antibiotics, now on cefepime alone.  She is 7.4 L positive for the admission so far.  2/1 she has developed progressive hypotension.  She received low-dose Lasix earlier in the day (only 10 mg).   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   Significant Hospital Events     Consults:  General surgery Wound care Infectious diseases Cardiology   Procedures:    Significant Diagnostic Tests:  Head CT 1/29 >> no acute abnormality MRI sacrum 1/31 >> osteomyelitis of the lower sacrum and coccyx associated with large decubitus ulcer, diffuse gluteal edema and presacral edema compatible with myositis.  No pyomyositis seen Echocardiogram 2/1 >>  normal left ventricular size and function, EF 60 to 65%, no pericardial effusion, normal valvular function  Micro Data:  Blood 1/29 >>   Antimicrobials:  Vancomycin 1/29 > 1/31 Cefepime 1/29 >>   Interim history/subjective:  No events overnight, remains on low dose pressors  Objective   Blood pressure (!) 92/44, pulse 84, temperature 98.4 F (36.9 C), temperature source Oral, resp. rate 17, height 5\' 6"  (1.676 m), weight 59 kg, SpO2 95 %.        Intake/Output Summary (Last 24 hours) at 12/01/2018 0959 Last data filed at 12/01/2018 4098 Gross per 24 hour  Intake 3754.11 ml  Output 1575 ml  Net 2179.11 ml   Filed Weights   11/26/18 2033 11/27/18 1400  Weight: 59 kg 59 kg    Examination: General: Chronically ill appearing female, NAD HENT: [place]/AT, PERRL, EOM-I and MMM Lungs: CTA bilaterally Cardiovascular: RRR, Nl S1/S2 and -M/R/G Abdomen: Soft, NT, ND and +BS Extremities: 1+ pitting edema Neuro: Awake and interactive, following basic commands Skin: WOC exam reviewed, intact other than decub  Resolved Hospital Problem list   Acute renal failure Hyponatremia  Assessment & Plan:  Septic shock.  Source is sacral osteomyelitis with surrounding myositis due to decubitus ulcer. -Volume resuscitation as she can tolerate.  This been some concern that she may be developing evidence for pulmonary edema.  Lasix was given earlier 2/1 but the dose was low and I do not think it necessarily precipitated her current hypotension.  Hold off any further diuresis for now - Continue neo peripherally - Check cortisol level and start stress dose steroids, if >20 will D/C in AM - Continue zosyn for now - Note CCS recommendation that she may require debridement.  Her anticoagulation is being held to facilitate  Toxic metabolic encephalopathy due to sepsis and metabolic disarray - D/C all sedating medications - Monitor clinically  History of right lower extremity DVT. - Has been treated with  Eliquis, currently on hold to facilitate bedside debridement.  Covering with heparin infusion  History of Parkinson's and dementia - On Sinemet   Relative anemia - Follow CBC  PCCM will continue to follow while pressors are needed.  Best practice:  Diet: Regular Pain/Anxiety/Delirium protocol (if indicated): Ultram VAP protocol (if indicated): N/A DVT prophylaxis: Heparin drip GI prophylaxis: N/A Glucose control: NA Mobility: Bedrest Code Status: Full Family Communication:  Disposition: SDU, may need to move to ICU depending on persistence of her hypotension  Labs   CBC: Recent Labs  Lab 11/26/18 1232 11/27/18 0530 11/28/18 0243 11/29/18 0415 11/30/18 0334 12/01/18 0143  WBC 13.1* 10.1 5.8 17.0* 3.9* 5.3  NEUTROABS 11.8*  --   --   --   --   --   HGB 10.9* 10.2* 8.8* 8.6* 9.1* 9.2*  HCT 33.3* 32.0* 28.1* 29.0* 28.8* 28.8*  MCV 95.1 96.1 100.0 87.1 98.0 95.4  PLT 127* 134* 125* 367 122* 155    Basic Metabolic Panel: Recent Labs  Lab 11/27/18 0530 11/28/18 0243 11/29/18 0415 11/30/18 0334 12/01/18 0143  NA 130* 136 145 136 137  K 4.3 3.4* 3.8 2.5* 2.9*  CL 100 113* 111 112* 114*  CO2 22 19* 24 18* 19*  GLUCOSE 127* 101* 125* 94 98  BUN 50* 27* 21 10 10   CREATININE 1.53* 0.86 0.87 0.49 0.48  CALCIUM 7.7* 6.7* 7.7* 6.7* 6.7*   GFR: Estimated Creatinine Clearance: 59.2 mL/min (by C-G formula based on SCr of 0.48 mg/dL). Recent Labs  Lab 11/26/18 1502  11/28/18 0243 11/29/18 0415 11/29/18 1722 11/30/18 0334 12/01/18 0143  PROCALCITON  --   --   --   --  0.35 0.32 0.37  WBC  --    < > 5.8 17.0*  --  3.9* 5.3  LATICACIDVEN 1.0  --   --   --  1.6  --   --    < > = values in this interval not displayed.    Liver Function Tests: Recent Labs  Lab 11/26/18 1232 11/27/18 0530 12/01/18 0143  AST 37 47* 82*  ALT 24 22 29   ALKPHOS 137* 123 133*  BILITOT 1.2 1.0 0.3  PROT 6.6 6.1* 4.5*  ALBUMIN 2.6* 2.3* 1.5*   No results for input(s): LIPASE,  AMYLASE in the last 168 hours. No results for input(s): AMMONIA in the last 168 hours.  ABG    Component Value Date/Time   PHART 7.452 (H) 11/26/2018 1430   PCO2ART 36.1 11/26/2018 1430   PO2ART 70.1 (L) 11/26/2018 1430   HCO3 24.8 11/26/2018 1430   TCO2 27 11/02/2018 1242   O2SAT 93.7 11/26/2018 1430     Coagulation Profile: No results for input(s): INR, PROTIME in the last 168 hours.  Cardiac Enzymes: No results for input(s): CKTOTAL, CKMB, CKMBINDEX, TROPONINI in the last 168 hours.  HbA1C: No results found for: HGBA1C  CBG: Recent Labs  Lab 11/26/18 1358  GLUCAP 121*    Review of Systems:   Denies pain. Marginal appetite. Otherwise negative   Past Medical History  She,  has a past medical history of Anal stenosis, Anxiety, Decubitus ulcer, Dementia in Parkinson's disease ([facility]) (09/18/2018), Depression, Fatty liver, GERD (gastroesophageal reflux disease), Hiatal hernia, History of anal fissures, History of chronic constipation,  History of kidney stones, Hyperlipidemia, Left ureteral calculus, Parkinson's disease ([facility]), and Wears glasses.   Surgical History    Past Surgical History:  Procedure Laterality Date  . ANAL FISSURECTOMY  1994   and Hemorrhoidectomy  . BOTOX INJECTION N/A 11/18/2013   Procedure: BOTOX INJECTION;  Surgeon: Hart Carwinora M Brodie, MD;  Location: [facility];  Service: Endoscopy;  Laterality: N/A;  . CHOLECYSTECTOMY OPEN  1982  . CYSTOSCOPY WITH RETROGRADE PYELOGRAM, URETEROSCOPY AND STENT PLACEMENT Left 06/06/2016   Procedure: CYSTOSCOPY WITH RETROGRADE PYELOGRAM, URETEROSCOPY AND STENT PLACEMENT;  Surgeon: Barron Alvineavid Grapey, MD;  [facility] [place];  Service: Urology;  Laterality: Left;  . FLEXIBLE SIGMOIDOSCOPY N/A 11/18/2013   Procedure: FLEXIBLE SIGMOIDOSCOPY/ with BOTOX;  Surgeon: Hart Carwinora M Brodie, MD;  Location: [facility];  Service: Endoscopy;  Laterality: N/A;  . HOLMIUM LASER APPLICATION Left 06/06/2016   Procedure: HOLMIUM LASER  APPLICATION;  Surgeon: Barron Alvineavid Grapey, MD;  [facility] [place];  Service: Urology;  Laterality: Left;  . TONSILLECTOMY  age 73  . TOTAL ABDOMINAL HYSTERECTOMY W/ BILATERAL SALPINGOOPHORECTOMY  1984   and Appendectomy     Social History   reports that she has never smoked. She has never used smokeless tobacco. She reports that she does not drink alcohol or use drugs.   Family History   Her family history includes Breast cancer in her maternal aunt; Diabetes in her brother; Pancreatic cancer (age of onset: 6256) in her maternal uncle. There is no history of Colon cancer.   Allergies Allergies  Allergen Reactions  . Sinemet [Carbidopa W-Levodopa] Nausea And Vomiting    Able to tolerate with Zofran  . Sulfa Antibiotics Other (See Comments)    Flushing and hot     Home Medications  Prior to Admission medications   Medication Sig Start Date End Date Taking? Authorizing Provider  ALPRAZolam Prudy Feeler(XANAX) 0.5 MG tablet Take 1 tablet (0.5 mg total) by mouth 3 (three) times daily as needed for anxiety. 11/07/18  Yes Gwyneth SproutPlunkett, Whitney, MD  apixaban (ELIQUIS) 5 MG TABS tablet Take 10 mg by mouth 2 (two) times daily.   Yes [provider]  carbidopa-levodopa (SINEMET IR) 25-100 MG tablet 1/2 tablet twice a day for 3 weeks, then take 1/2 tablet three times a day Patient taking differently: Take 0.5 tablets by mouth 3 (three) times daily.  09/18/18  Yes York SpanielWillis, Charles K, MD  ciprofloxacin (CIPRO) 500 MG tablet Take 500 mg by mouth 2 (two) times daily.   Yes [provider]  lansoprazole (PREVACID) 30 MG capsule Take 30 mg by mouth every morning.    Yes [provider]  Multiple Vitamins-Minerals (ONE-A-DAY WOMENS 50 PLUS PO) Take 1 tablet by mouth daily.   Yes [provider]  Nutritional Supplements (RESOURCE 2.0) LIQD Take 120 mLs by mouth 2 (two) times daily.   Yes [provider]  NUTRITIONAL SUPPLEMENTS PO Take 1 Units by mouth daily at 12  noon. "Magic Cup."   Yes [provider]  ondansetron (ZOFRAN) 4 MG tablet Take 1 tablet (4 mg total) by mouth every 8 (eight) hours as needed for nausea or vomiting. Patient taking differently: Take 4 mg by mouth every 8 (eight) hours. Takes with carbidopa-levodopa 01/16/18  Yes Huston FoleyAthar, Saima, MD  traMADol (ULTRAM) 50 MG tablet Take 50 mg by mouth every 6 (six) hours as needed for moderate pain.    Yes [provider]  fluconazole (DIFLUCAN) 100 MG tablet Take 1 tablet (100 mg total) by mouth  daily. Patient not taking: Reported on 11/26/2018 11/06/18   Darlin Drop, DO    The patient is critically ill with multiple organ systems failure and requires high complexity decision making for assessment and support, frequent evaluation and titration of therapies, application of advanced monitoring technologies and extensive interpretation of multiple databases.   Critical Care Time devoted to patient care services described in this note is  31  Minutes. This time reflects time of care of this signee Dr Koren Bound. This critical care time does not reflect procedure time, or teaching time or supervisory time of PA/NP/Med student/Med Resident etc but could involve care discussion time.  Alyson Reedy, M.D. [facility] Pulmonary/Critical Care Medicine. Pager: [phone]. After hours pager: [phone].

## 2018-12-01 NOTE — Progress Notes (Signed)
Patient ID: Becky Figueroa, female   DOB: Jan 24, 1946, 73 y.o.   MRN: [id number]         [facility]  Date of Admission:  11/26/2018   Total days of antibiotics 6        Day 2 piperacillin tazobactam         ASSESSMENT: She has a large necrotic sacral pressure ulcer complicated by sacrococcygeal osteomyelitis.  A wound culture obtained at the wound center on 11/20/2018 grew E. coli and Bacteroides.  I will continue Pipracil and tazobactam for now.  Although she has been on antibiotics for nearly a week, I would agree with bone biopsy for stain and culture to help guide future antibiotic therapy.  PLAN: 1. 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 carbidopa-levodopa  0.5 tablet Oral TID WC  . Chlorhexidine Gluconate Cloth  6 each Topical Daily  . feeding supplement (ENSURE ENLIVE)  237 mL Oral BID BM  . hydrocortisone sodium succinate  50 mg Intravenous Q6H  . mouth rinse  15 mL Mouth Rinse BID  . sodium chloride flush  10-40 mL Intracatheter Q12H   Continuous Infusions: . sodium chloride 100 mL/hr at 12/01/18 0739  . sodium chloride Stopped (11/29/18 1737)  . heparin 1,250 Units/hr (12/01/18 1709)  . phenylephrine (NEO-SYNEPHRINE) Adult infusion 25 mcg/min (12/01/18 1509)  . piperacillin-tazobactam (ZOSYN)  IV Stopped (12/01/18 1707)  . potassium chloride 10 mEq (12/01/18 1706)   PRN Meds:.acetaminophen **OR** acetaminophen, ALPRAZolam, ondansetron **OR** ondansetron (ZOFRAN) IV, sodium chloride flush, traMADol   SUBJECTIVE: She tells me that it is very painful when she has hydrotherapy and sacral wound care.  Review of Systems: Review of Systems  Unable to perform ROS: Mental acuity    Allergies  Allergen Reactions  . Sinemet  [Carbidopa W-Levodopa] Nausea And Vomiting    Able to tolerate with Zofran  . Sulfa Antibiotics Other (See Comments)    Flushing and hot    OBJECTIVE: Vitals:   12/01/18 1400 12/01/18 1500 12/01/18 1600 12/01/18 1700  BP: (!) 100/55 (!) 110/48 (!) 108/57 106/63  Pulse: 81 73 76 79  Resp: (!) 25 (!) 24 (!) 22 (!) 23  Temp:      TempSrc:      SpO2: 94% 96% 96% 96%  Weight:      Height:       Body mass index is 20.99 kg/m.  Physical Exam Constitutional:      Comments: She is alert and resting quietly in bed.  She is cachectic.  Her son and daughter-in-law are at the bedside.  Skin:    Comments: Right arm PICC.  Psychiatric:        Mood and Affect: Mood normal.       Lab Results Lab Results  Component Value Date   WBC 5.3 12/01/2018   HGB 9.2 (L) 12/01/2018   HCT 28.8 (L) 12/01/2018   MCV 95.4 12/01/2018   PLT 155 12/01/2018    Lab Results  Component Value Date   CREATININE 0.48 12/01/2018   BUN 10 12/01/2018   NA 137 12/01/2018   K 2.9 (L) 12/01/2018   CL 114 (H) 12/01/2018   CO2 19 (L) 12/01/2018    Lab Results  Component Value Date   ALT 29 12/01/2018  AST 82 (H) 12/01/2018   ALKPHOS 133 (H) 12/01/2018   BILITOT 0.3 12/01/2018     Microbiology: Recent Results (from the past 240 hour(s))  Culture, blood (routine x 2)     Status: None   Collection Time: 11/26/18  3:02 PM  Result Value Ref Range Status   Specimen Description   Final    BLOOD RIGHT ANTECUBITAL Performed at [facility], [address] [place], [place] [address]    Special Requests   Final    BOTTLES DRAWN AEROBIC AND ANAEROBIC Blood Culture adequate volume Performed at [facility], [address] [place], [place] [address]    Culture   Final    NO GROWTH 5 DAYS Performed at [facility], [address] [place], [place] [address]    Report Status 12/01/2018 FINAL  Final  Culture, blood (routine x 2)     Status: None   Collection  Time: 11/26/18  3:02 PM  Result Value Ref Range Status   Specimen Description   Final    BLOOD LEFT ANTECUBITAL Performed at [facility], [address], [place], [place] [address]    Special Requests   Final    BOTTLES DRAWN AEROBIC AND ANAEROBIC Blood Culture adequate volume Performed at [facility], [address] [place], [place] [address]    Culture   Final    NO GROWTH 5 DAYS Performed at [facility], [address] [place], [place] [address]    Report Status 12/01/2018 FINAL  Final  MRSA PCR Screening     Status: None   Collection Time: 11/27/18  2:43 PM  Result Value Ref Range Status   MRSA by PCR NEGATIVE NEGATIVE Final    Comment:        The GeneXpert MRSA Assay (FDA approved for NASAL specimens only), is one component of a comprehensive MRSA colonization surveillance program. It is not intended to diagnose MRSA infection nor to guide or monitor treatment for MRSA infections. Performed at [facility], [address], [place], [place] [address]     Cliffton Asters, MD [facility] [phone] pager   [phone] cell 12/01/2018, 5:48 PM

## 2018-12-01 NOTE — Progress Notes (Signed)
[facility] Progress Note     Subjective: CC- sacral wound Seen with Hydrotherapy.   Objective: Vital signs in last 24 hours: Temp:  [97.5 F (36.4 C)-98.4 F (36.9 C)] 97.9 F (36.6 C) (02/03 0800) Pulse Rate:  [25-96] 74 (02/03 1052) Resp:  [15-33] 22 (02/03 1052) BP: (73-124)/(34-73) 84/35 (02/03 1052) SpO2:  [92 %-98 %] 96 % (02/03 1052) Last BM Date: 11/29/18  Intake/Output from previous day: 02/02 0701 - 02/03 0700 In: 3537.4 [P.O.:120; I.V.:2975.2; IV Piggyback:442.2] Out: 1575 [Urine:1575] Intake/Output this shift: Total I/O In: 755.9 [I.V.:755.9] Out: -   PE: Gen:  Alert, NAD, pleasant GU: Sacral wound as below with necrotic tissue and exposed bone     Lab Results:  Recent Labs    11/30/18 0334 12/01/18 0143  WBC 3.9* 5.3  HGB 9.1* 9.2*  HCT 28.8* 28.8*  PLT 122* 155   BMET Recent Labs    11/30/18 0334 12/01/18 0143  NA 136 137  K 2.5* 2.9*  CL 112* 114*  CO2 18* 19*  GLUCOSE 94 98  BUN 10 10  CREATININE 0.49 0.48  CALCIUM 6.7* 6.7*   PT/INR No results for input(s): LABPROT, INR in the last 72 hours. CMP     Component Value Date/Time   NA 137 12/01/2018 0143   K 2.9 (L) 12/01/2018 0143   CL 114 (H) 12/01/2018 0143   CO2 19 (L) 12/01/2018 0143   GLUCOSE 98 12/01/2018 0143   BUN 10 12/01/2018 0143   CREATININE 0.48 12/01/2018 0143   CALCIUM 6.7 (L) 12/01/2018 0143   PROT 4.5 (L) 12/01/2018 0143   ALBUMIN 1.5 (L) 12/01/2018 0143   AST 82 (H) 12/01/2018 0143   ALT 29 12/01/2018 0143   ALKPHOS 133 (H) 12/01/2018 0143   BILITOT 0.3 12/01/2018 0143   GFRNONAA >60 12/01/2018 0143   GFRAA >60 12/01/2018 0143   Lipase  No results found for: LIPASE     Studies/Results: [place] Ekg Site Rite  Result Date: 11/29/2018 If Site Rite image not attached, placement could not be confirmed due to current cardiac rhythm.   Anti-infectives: Anti-infectives (From admission, onward)   Start     Dose/Rate Route Frequency Ordered  Stop   11/30/18 1200  piperacillin-tazobactam (ZOSYN) IVPB 3.375 g     3.375 g 12.5 mL/hr over 240 Minutes Intravenous Every 8 hours 11/30/18 1133     11/29/18 1200  vancomycin (VANCOCIN) IVPB 1000 mg/200 mL premix  Status:  Discontinued     1,000 mg 200 mL/hr over 60 Minutes Intravenous Every 24 hours 11/28/18 1304 11/29/18 0913   11/28/18 1800  ceFEPIme (MAXIPIME) 2 g in sodium chloride 0.9 % 100 mL IVPB  Status:  Discontinued     2 g 200 mL/hr over 30 Minutes Intravenous Every 12 hours 11/28/18 1304 11/30/18 1117   11/28/18 1200  vancomycin (VANCOCIN) 1,250 mg in sodium chloride 0.9 % 250 mL IVPB  Status:  Discontinued     1,250 mg 166.7 mL/hr over 90 Minutes Intravenous Every 48 hours 11/26/18 1715 11/28/18 1304   11/27/18 0600  ceFEPIme (MAXIPIME) 1 g in sodium chloride 0.9 % 100 mL IVPB  Status:  Discontinued     1 g 200 mL/hr over 30 Minutes Intravenous Every 12 hours 11/26/18 1715 11/28/18 1304   11/26/18 1430  ceFEPIme (MAXIPIME) 2 g in sodium chloride 0.9 % 100 mL IVPB     2 g 200 mL/hr over 30 Minutes Intravenous STAT 11/26/18 1429 11/26/18 1604   11/26/18  1430  vancomycin (VANCOCIN) IVPB 1000 mg/200 mL premix     1,000 mg 200 mL/hr over 60 Minutes Intravenous STAT 11/26/18 1429 11/26/18 1621       Assessment/Plan Parkinson's disease Dementia Anxiety/depression Malnutrition H/o RLE DVT - hold eliquis Septic shock/encephalopathy  Sacral decubitus ulcer with osteomyelitis - Continue PT/hydrotherapy and BID wet to dry dressing changes. Patient would benefit from further debridement, will discuss timing with MD. Continue to hold eliquis. Appreciate ID recommendations regarding antibiotics/osteomyelitis.  ID -maxipime/vancomycin 1/29>>2/2, zosyn 2/2>> VTE -SCDs, IV heparin FEN - reg diet, NPO after MN Follow up -TBD   LOS: 5 days    Wells GuilesKelly Rayburn , [facility] 12/01/2018, 1:47 PM Pager: [phone] Consults: [phone] Mon-Fri 7:00  am-4:30 pm Sat-Sun 7:00 am-11:30 am

## 2018-12-01 NOTE — Progress Notes (Signed)
ANTICOAGULATION CONSULT NOTE - Follow Up Consult  Pharmacy Consult for Heparin Indication: DVT (PTA apixaban on hold)  Allergies  Allergen Reactions  . Sinemet [Carbidopa W-Levodopa] Nausea And Vomiting    Able to tolerate with Zofran  . Sulfa Antibiotics Other (See Comments)    Flushing and hot    Patient Measurements: Height: 5\' 6"  (167.6 cm) Weight: 130 lb 1.1 oz (59 kg) IBW/kg (Calculated) : 59.3 Heparin Dosing Weight: TBW  Vital Signs: Temp: 98.4 F (36.9 C) (02/03 0309) Temp Source: Oral (02/03 0309) BP: 92/44 (02/03 0900) Pulse Rate: 84 (02/03 0900)  Labs: Recent Labs    11/29/18 0415  11/30/18 0334 11/30/18 1530 12/01/18 0143  HGB 8.6*  --  9.1*  --  9.2*  HCT 29.0*  --  28.8*  --  28.8*  PLT 367  --  122*  --  155  HEPARINUNFRC  --    < > 0.24* 0.27* 0.33  CREATININE 0.87  --  0.49  --  0.48   < > = values in this interval not displayed.    Estimated Creatinine Clearance: 59.2 mL/min (by C-G formula based on SCr of 0.48 mg/dL).   Medications:  Infusions:  . sodium chloride 100 mL/hr at 12/01/18 0739  . sodium chloride Stopped (11/29/18 1737)  . heparin 1,250 Units/hr (12/01/18 0616)  . phenylephrine (NEO-SYNEPHRINE) Adult infusion 25 mcg/min (12/01/18 0906)  . piperacillin-tazobactam (ZOSYN)  IV 3.375 g (12/01/18 9191)  . sodium chloride      Assessment: Becky Figueroa admitted on 1/29 with osteomyelitis of sacral decubitus ulcer with surgical consult for debridement. Pt was recently diagnosed with RLE DVT and started on Eliquis. Pharmacy is consulted to dose Heparin IV while PTA Eliquis for DVT is on hold.   Significant events: 1/29 Last apixaban dose taken PTA 1/30 Heparin held at 08:00 for debridement, resumed heparin s/p procedure Evening of 1/31-2/1 pt pulled out line while in MRI, heparin off ~ 1-2 hours  Today, 12/01/2018:  Heparin level 0.34, therapeutic on heparin at 1250 units/hr  CBC:  Hgb stable at 9.2, Plt WNL  SCr significantly  improved since admission  AST increasing (37 > 47 > 82), ALT and Tbili WNL  No bleeding or complications per RN   Goal of Therapy:  Heparin level 0.3-0.7 units/ml aPTT 66-102 seconds Monitor platelets by anticoagulation protocol: Yes   Plan:  Continue heparin IV infusion at 1250 units/hr Daily heparin level and CBC  Continue to monitor H&H and platelets  Follow up plans for further debridement (hold heparin 4 hours per previous notes prior to further I&D)   Lynann Beaver PharmD, BCPS Pager [phone] 12/01/2018 10:18 AM

## 2018-12-01 NOTE — Progress Notes (Signed)
Brief Pharmacy Note re: IV Heparin  O: Heparin level= 0.33 on 1250 units/hr      No bleeding or infusion related issues reported by RN  A/P: Heparin therapeutic.           Repeat heparin level in 8h to confirm heparin at steady-state         Daily heparin level & CBC   Junita Push, PharmD, BCPS 12/01/2018@3 :24 AM

## 2018-12-02 ENCOUNTER — Encounter (HOSPITAL_COMMUNITY): Payer: Self-pay | Admitting: Anesthesiology

## 2018-12-02 ENCOUNTER — Inpatient Hospital Stay (HOSPITAL_COMMUNITY): Payer: Medicare Other | Admitting: Anesthesiology

## 2018-12-02 ENCOUNTER — Encounter (HOSPITAL_COMMUNITY)
Admission: EM | Disposition: A | Discharge status: Skilled Nursing Facility | Payer: Self-pay | Source: Ambulatory Visit | Attending: Internal Medicine

## 2018-12-02 HISTORY — PX: IRRIGATION AND DEBRIDEMENT BUTTOCKS: SHX6601

## 2018-12-02 LAB — COMPREHENSIVE METABOLIC PANEL
ALT: 36 U/L (ref 0–44)
AST: 44 U/L — ABNORMAL HIGH (ref 15–41)
Albumin: 1.5 g/dL — ABNORMAL LOW (ref 3.5–5.0)
Alkaline Phosphatase: 140 U/L — ABNORMAL HIGH (ref 38–126)
Anion gap: 4 — ABNORMAL LOW (ref 5–15)
BUN: 8 mg/dL (ref 8–23)
CO2: 21 mmol/L — ABNORMAL LOW (ref 22–32)
Calcium: 6.9 mg/dL — ABNORMAL LOW (ref 8.9–10.3)
Chloride: 114 mmol/L — ABNORMAL HIGH (ref 98–111)
Creatinine, Ser: 0.46 mg/dL (ref 0.44–1.00)
GFR calc Af Amer: >60 mL/min (ref >60)
GFR calc non Af Amer: >60 mL/min (ref >60)
GLUCOSE: 139 mg/dL — AB (ref 70–99)
Potassium: 3.1 mmol/L — ABNORMAL LOW (ref 3.5–5.1)
Sodium: 139 mmol/L (ref 135–145)
Total Bilirubin: 0.2 mg/dL — ABNORMAL LOW (ref 0.3–1.2)
Total Protein: 4.5 g/dL — ABNORMAL LOW (ref 6.5–8.1)

## 2018-12-02 LAB — POCT I-STAT 7, (LYTES, BLD GAS, ICA,H+H)
Acid-base deficit: 5 mmol/L — ABNORMAL HIGH (ref 0.0–2.0)
BICARBONATE: 22.1 mmol/L (ref 20.0–28.0)
Calcium, Ion: 1.15 mmol/L (ref 1.15–1.40)
HCT: 28 % — ABNORMAL LOW (ref 36.0–46.0)
Hemoglobin: 9.5 g/dL — ABNORMAL LOW (ref 12.0–15.0)
O2 Saturation: 100 %
Potassium: 3.9 mmol/L (ref 3.5–5.1)
SODIUM: 140 mmol/L (ref 135–145)
TCO2: 24 mmol/L (ref 22–32)
pCO2 arterial: 47.3 mmHg (ref 32.0–48.0)
pH, Arterial: 7.278 — ABNORMAL LOW (ref 7.350–7.450)
pO2, Arterial: 486 mmHg — ABNORMAL HIGH (ref 83.0–108.0)

## 2018-12-02 LAB — CBC
HCT: 30.8 % — ABNORMAL LOW (ref 36.0–46.0)
HEMOGLOBIN: 9.8 g/dL — AB (ref 12.0–15.0)
MCH: 30.2 pg (ref 26.0–34.0)
MCHC: 31.8 g/dL (ref 30.0–36.0)
MCV: 94.8 fL (ref 80.0–100.0)
Platelets: 162 10*3/uL (ref 150–400)
RBC: 3.25 MIL/uL — ABNORMAL LOW (ref 3.87–5.11)
RDW: 14.5 % (ref 11.5–15.5)
WBC: 4.8 10*3/uL (ref 4.0–10.5)
nRBC: 0 % (ref 0.0–0.2)

## 2018-12-02 LAB — HEPARIN LEVEL (UNFRACTIONATED): Heparin Unfractionated: <0.1 IU/mL — ABNORMAL LOW (ref 0.30–0.70)

## 2018-12-02 LAB — MAGNESIUM: Magnesium: 1.6 mg/dL — ABNORMAL LOW (ref 1.7–2.4)

## 2018-12-02 LAB — PHOSPHORUS: Phosphorus: 2 mg/dL — ABNORMAL LOW (ref 2.5–4.6)

## 2018-12-02 SURGERY — IRRIGATION AND DEBRIDEMENT BUTTOCKS
Anesthesia: General | Site: Back

## 2018-12-02 MED ORDER — FENTANYL CITRATE (PF) 100 MCG/2ML IJ SOLN
INTRAMUSCULAR | Status: AC
  Filled 2018-12-02: qty 2

## 2018-12-02 MED ORDER — FENTANYL CITRATE (PF) 100 MCG/2ML IJ SOLN
INTRAMUSCULAR | Status: DC | PRN
  Administered 2018-12-02: 75 ug via INTRAVENOUS
  Administered 2018-12-02: 25 ug via INTRAVENOUS

## 2018-12-02 MED ORDER — ROCURONIUM BROMIDE 100 MG/10ML IV SOLN
INTRAVENOUS | Status: AC
  Filled 2018-12-02: qty 1

## 2018-12-02 MED ORDER — ACETAMINOPHEN 500 MG PO TABS
1000.0000 mg | ORAL_TABLET | Freq: Once | ORAL | Status: AC
  Administered 2018-12-02: 1000 mg via ORAL
  Filled 2018-12-02: qty 2

## 2018-12-02 MED ORDER — SODIUM BICARBONATE 8.4 % IV SOLN
50.0000 meq | Freq: Once | INTRAVENOUS | Status: AC
  Administered 2018-12-02: 50 mL via INTRAVENOUS
  Filled 2018-12-02: qty 50

## 2018-12-02 MED ORDER — LIP MEDEX EX OINT
TOPICAL_OINTMENT | CUTANEOUS | Status: AC
  Administered 2018-12-02: 18:00:00
  Filled 2018-12-02: qty 7

## 2018-12-02 MED ORDER — DEXAMETHASONE SODIUM PHOSPHATE 10 MG/ML IJ SOLN
INTRAMUSCULAR | Status: DC | PRN
  Administered 2018-12-02: 4 mg via INTRAVENOUS

## 2018-12-02 MED ORDER — PROPOFOL 10 MG/ML IV BOLUS
INTRAVENOUS | Status: AC
  Filled 2018-12-02: qty 20

## 2018-12-02 MED ORDER — PHENYLEPHRINE 40 MCG/ML (10ML) SYRINGE FOR IV PUSH (FOR BLOOD PRESSURE SUPPORT)
PREFILLED_SYRINGE | INTRAVENOUS | Status: DC | PRN
  Administered 2018-12-02: 120 ug via INTRAVENOUS

## 2018-12-02 MED ORDER — POTASSIUM CHLORIDE 10 MEQ/100ML IV SOLN
10.0000 meq | INTRAVENOUS | Status: AC
  Administered 2018-12-02 (×4): 10 meq via INTRAVENOUS
  Filled 2018-12-02 (×5): qty 100

## 2018-12-02 MED ORDER — PROPOFOL 10 MG/ML IV BOLUS
INTRAVENOUS | Status: DC | PRN
  Administered 2018-12-02: 50 mg via INTRAVENOUS

## 2018-12-02 MED ORDER — POTASSIUM CHLORIDE CRYS ER 20 MEQ PO TBCR: 30.0000 meq | EXTENDED_RELEASE_TABLET | ORAL | Status: DC

## 2018-12-02 MED ORDER — VITAMINS A & D EX OINT
TOPICAL_OINTMENT | CUTANEOUS | Status: AC
  Administered 2018-12-02: 10:00:00
  Filled 2018-12-02: qty 5

## 2018-12-02 MED ORDER — LACTATED RINGERS IV SOLN
INTRAVENOUS | Status: DC | PRN
  Administered 2018-12-02: 12:00:00 via INTRAVENOUS
  Administered 2018-12-02: 1000 mL via INTRAVENOUS

## 2018-12-02 MED ORDER — ROCURONIUM BROMIDE 10 MG/ML (PF) SYRINGE
PREFILLED_SYRINGE | INTRAVENOUS | Status: DC | PRN
  Administered 2018-12-02: 40 mg via INTRAVENOUS
  Administered 2018-12-02 (×2): 10 mg via INTRAVENOUS

## 2018-12-02 MED ORDER — HEPARIN (PORCINE) 25000 UT/250ML-% IV SOLN
1250.0000 [IU]/h | INTRAVENOUS | Status: DC
  Administered 2018-12-03: 1250 [IU]/h via INTRAVENOUS
  Filled 2018-12-02: qty 250

## 2018-12-02 MED ORDER — MAGNESIUM SULFATE 2 GM/50ML IV SOLN
2.0000 g | Freq: Once | INTRAVENOUS | Status: AC
  Administered 2018-12-02: 2 g via INTRAVENOUS
  Filled 2018-12-02: qty 50

## 2018-12-02 MED ORDER — FENTANYL CITRATE (PF) 100 MCG/2ML IJ SOLN
INTRAMUSCULAR | Status: AC
  Administered 2018-12-02: 50 ug via INTRAVENOUS
  Filled 2018-12-02: qty 4

## 2018-12-02 MED ORDER — SUGAMMADEX SODIUM 500 MG/5ML IV SOLN
INTRAVENOUS | Status: DC | PRN
  Administered 2018-12-02: 200 mg via INTRAVENOUS

## 2018-12-02 MED ORDER — ALBUMIN HUMAN 5 % IV SOLN
INTRAVENOUS | Status: AC
  Filled 2018-12-02: qty 250

## 2018-12-02 MED ORDER — SODIUM BICARBONATE 8.4 % IV SOLN
INTRAVENOUS | Status: AC
  Filled 2018-12-02: qty 50

## 2018-12-02 MED ORDER — LIDOCAINE 2% (20 MG/ML) 5 ML SYRINGE
INTRAMUSCULAR | Status: DC | PRN
  Administered 2018-12-02: 60 mg via INTRAVENOUS

## 2018-12-02 MED ORDER — ONDANSETRON HCL 4 MG/2ML IJ SOLN
INTRAMUSCULAR | Status: DC | PRN
  Administered 2018-12-02: 4 mg via INTRAVENOUS

## 2018-12-02 MED ORDER — FENTANYL CITRATE (PF) 100 MCG/2ML IJ SOLN
25.0000 ug | INTRAMUSCULAR | Status: DC | PRN
  Administered 2018-12-02 (×3): 50 ug via INTRAVENOUS

## 2018-12-02 MED ORDER — SODIUM CHLORIDE 0.9 % IR SOLN
Status: DC | PRN
  Administered 2018-12-02: 1000 mL

## 2018-12-02 SURGICAL SUPPLY — 6 items
BNDG GAUZE ELAST 4 BULKY (GAUZE/BANDAGES/DRESSINGS) ×3 IMPLANT
GOWN STRL REUS W/TWL XL LVL3 (GOWN DISPOSABLE) ×6 IMPLANT
KIT BASIN OR (CUSTOM PROCEDURE TRAY) ×3 IMPLANT
PACK GENERAL/GYN (CUSTOM PROCEDURE TRAY) ×3 IMPLANT
PAD ABD 7.5X8 STRL (GAUZE/BANDAGES/DRESSINGS) ×3 IMPLANT
TAPE CLOTH SURG 6X10 WHT LF (GAUZE/BANDAGES/DRESSINGS) ×3 IMPLANT

## 2018-12-02 NOTE — Progress Notes (Signed)
Patient ID: Becky Figueroa, female   DOB: 1946-03-30, 73 y.o.   MRN: [id number]          [facility]    Date of Admission:  11/26/2018   Total days of antibiotics 7        Day 3 piperacillin tazobactam  Ms. Schmiesing remains afebrile.  She has recent 30 pound weight loss and has developed a large sacral pressure ulcer complicated by sacrococcygeal osteomyelitis.  She is going to the OR for debridement today.  I recommend submitting bone for Gram stain, aerobic and anaerobic cultures.         Cliffton Asters, MD [facility] [phone] pager   [phone] cell 12/02/2018, 9:16 AM

## 2018-12-02 NOTE — Progress Notes (Signed)
[facility] ADULT ICU REPLACEMENT PROTOCOL FOR AM LAB REPLACEMENT ONLY  The patient does apply for the [facility] Adult ICU Electrolyte Replacment Protocol based on the criteria listed below:   1. Is GFR >/= 40 ml/min? Yes.    Patient's GFR today is >60 2. Is urine output >/= 0.5 ml/kg/hr for the last 6 hours? Yes.   Patient's UOP is 1.5 ml/kg/hr 3. Is BUN < 60 mg/dL? Yes.    Patient's BUN today is 8 4. Abnormal electrolyte  K 3.1 5. Ordered repletion with: per protocol 6. If a panic level lab has been reported, has the CCM MD in charge been notified? Yes.  .   Physician:  Tonny Branch, Lang Snow 12/02/2018 6:55 AM

## 2018-12-02 NOTE — Anesthesia Procedure Notes (Signed)
Procedure Name: Intubation Date/Time: 12/02/2018 2:15 PM Performed by: Lavina Hamman, CRNA Pre-anesthesia Checklist: Patient identified, Emergency Drugs available, Suction available, Patient being monitored and Timeout performed Patient Re-evaluated:Patient Re-evaluated prior to induction Oxygen Delivery Method: Circle system utilized Preoxygenation: Pre-oxygenation with 100% oxygen Induction Type: IV induction Ventilation: Mask ventilation without difficulty Laryngoscope Size: Mac and 3 Grade View: Grade I Tube type: Oral Tube size: 7.0 mm Number of attempts: 1 Airway Equipment and Method: Stylet Placement Confirmation: ETT inserted through vocal cords under direct vision,  positive ETCO2,  CO2 detector and breath sounds checked- equal and bilateral Secured at: 22 cm Tube secured with: Tape Dental Injury: Teeth and Oropharynx as per pre-operative assessment

## 2018-12-02 NOTE — Progress Notes (Signed)
LCSW following for disposition.   [facility]. Patient not medically stable for dc per notes.   LCSW will continue to follow.

## 2018-12-02 NOTE — Consult Note (Addendum)
WOC follow-up: Pt plans to have surgical debridement of sacral wound today in the OR according to progress notes.  Surgical team following for assessment and plan of care; please refer to their team for further questions.  Please re-consult if further assistance is needed.  Thank-you,  Cammie Mcgee MSN, RN, CWOCN, [place], CNS [phone]

## 2018-12-02 NOTE — Progress Notes (Signed)
ANTICOAGULATION CONSULT NOTE - Follow Up Consult  Pharmacy Consult for Heparin Indication: DVT (PTA apixaban on hold)  Allergies  Allergen Reactions  . Sinemet [Carbidopa W-Levodopa] Nausea And Vomiting    Able to tolerate with Zofran  . Sulfa Antibiotics Other (See Comments)    Flushing and hot    Patient Measurements: Height: 5\' 6"  (167.6 cm) Weight: 130 lb 1.1 oz (59 kg) IBW/kg (Calculated) : 59.3 Heparin Dosing Weight: TBW  Vital Signs: Temp: 97.2 F (36.2 C) (02/04 0400) Temp Source: Axillary (02/04 0400) BP: 113/57 (02/04 0600) Pulse Rate: 81 (02/04 0600)  Labs: Recent Labs    11/30/18 0334  12/01/18 0143 12/01/18 1102 12/02/18 0500 12/02/18 0528  HGB 9.1*  --  9.2*  --  9.8*  --   HCT 28.8*  --  28.8*  --  30.8*  --   PLT 122*  --  155  --  162  --   HEPARINUNFRC 0.24*   < > 0.33 0.34  --  <0.10*  CREATININE 0.49  --  0.48  --  0.46  --    < > = values in this interval not displayed.    Estimated Creatinine Clearance: 59.2 mL/min (by C-G formula based on SCr of 0.46 mg/dL).   Medications:  Infusions:  . sodium chloride 100 mL/hr at 12/02/18 0600  . sodium chloride Stopped (11/29/18 1737)  . phenylephrine (NEO-SYNEPHRINE) Adult infusion 24 mcg/min (12/02/18 0600)  . piperacillin-tazobactam (ZOSYN)  IV 12.5 mL/hr at 12/02/18 0600    Assessment: Becky Figueroa admitted on 1/29 with osteomyelitis of sacral decubitus ulcer with surgical consult for debridement. Pt was recently diagnosed with RLE DVT and started on Eliquis. Pharmacy is consulted to dose Heparin IV while PTA Eliquis for DVT is on hold.   Significant events: 1/29 Last apixaban dose taken PTA 1/30 Heparin held at 08:00 for debridement, resumed heparin s/p procedure Evening of 1/31-2/1 pt pulled out line while in MRI, heparin off ~ 1-2 hours 2/4 Heparin off at 02:00, planning further debridement.  Today, 12/02/2018:  Heparin level <0.1, subtherapeutic as expected since heparin turned off about 2.5  hours before lab draw.  CBC:  Hgb remains low/stable at 9.8, Plt WNL  SCr significantly improved since admission  No bleeding or complications per RN   Goal of Therapy:  Heparin level 0.3-0.7 units/ml aPTT 66-102 seconds Monitor platelets by anticoagulation protocol: Yes   Plan:  Follow up plans to resume heparin IV infusion at 1250 units/hr - timing per MD. Check 8 hour heparin level after resuming Daily heparin level and CBC  Continue to monitor H&H and platelets  Follow up plans for further debridement (hold heparin 4 hours per previous notes prior to further I&D)   Lynann Beaver PharmD, BCPS Pager [phone] 12/02/2018 7:09 AM

## 2018-12-02 NOTE — Progress Notes (Addendum)
ANTICOAGULATION CONSULT NOTE - Follow Up Consult  Pharmacy Consult for Heparin Indication: DVT (PTA apixaban on hold)  Allergies  Allergen Reactions  . Sinemet [Carbidopa W-Levodopa] Nausea And Vomiting    Able to tolerate with Zofran  . Sulfa Antibiotics Other (See Comments)    Flushing and hot    Patient Measurements: Height: 5\' 6"  (167.6 cm) Weight: 130 lb 1.1 oz (59 kg) IBW/kg (Calculated) : 59.3 Heparin Dosing Weight: TBW  Vital Signs: Temp: 97.2 F (36.2 C) (02/04 1635) Temp Source: Oral (02/04 0800) BP: 125/83 (02/04 1600) Pulse Rate: 85 (02/04 1730)  Labs: Recent Labs    11/30/18 0334  12/01/18 0143 12/01/18 1102 12/02/18 0500 12/02/18 0528 12/02/18 1445  HGB 9.1*  --  9.2*  --  9.8*  --  9.5*  HCT 28.8*  --  28.8*  --  30.8*  --  28.0*  PLT 122*  --  155  --  162  --   --   HEPARINUNFRC 0.24*   < > 0.33 0.34  --  <0.10*  --   CREATININE 0.49  --  0.48  --  0.46  --   --    < > = values in this interval not displayed.    Estimated Creatinine Clearance: 59.2 mL/min (by C-G formula based on SCr of 0.46 mg/dL).   Medications:  Infusions:  . sodium chloride 100 mL/hr at 12/02/18 0600  . sodium chloride Stopped (11/29/18 1737)  . [START ON 12/03/2018] heparin    . phenylephrine (NEO-SYNEPHRINE) Adult infusion 30 mcg/min (12/02/18 1737)  . piperacillin-tazobactam (ZOSYN)  IV 0 g (12/02/18 1027)    Assessment: 72 yoF admitted on 1/29 with osteomyelitis of sacral decubitus ulcer with surgical consult for debridement. Pt was recently diagnosed with RLE DVT and started on Eliquis. Pharmacy is consulted to dose Heparin IV while PTA Eliquis for DVT is on hold.   Significant events: 1/29 Last apixaban dose taken PTA 1/30 Heparin held at 08:00 for debridement, resumed heparin s/p procedure Evening of 1/31-2/1 pt pulled out line while in MRI, heparin off ~ 1-2 hours 2/4 Heparin off at 02:00, planning further debridement.  Today, 12/02/2018 5:49 PM  Heparin  level <0.1, subtherapeutic as expected since heparin turned off about 2.5 hours before lab draw.  CBC:  Hgb remains low/stable at 9.8, Plt WNL  SCr significantly improved since admission  No bleeding or complications per RN  Post-op 12/02/18 5:50 PM    Per Dr. Michaell Cowing, resume heparin 12 hours postoperatively without initial bolus  Goal of Therapy:  Heparin level 0.3-0.7 units/ml aPTT 66-102 seconds Monitor platelets by anticoagulation protocol: Yes   Plan:  Resume heparin infusion at 0330 2/5 at previous rate of 1250 units/hr- RN informed of plan and will pass along in report Check 8 hour heparin level after resuming Daily heparin level and CBC  Continue to monitor H&H and platelets  Follow up plans for further debridement (hold heparin 4 hours per previous notes prior to further I&D)   Luisa Hart, PharmD Clinical Pharmacist Pager # [phone]  12/02/2018 5:43 PM

## 2018-12-02 NOTE — Progress Notes (Signed)
[facility] Progress Note  Day of Surgery  Subjective: CC-  Slept well last night. No new complaints. Continues to have pain from sacral wound.   Objective: Vital signs in last 24 hours: Temp:  [97.2 F (36.2 C)-97.7 F (36.5 C)] 97.2 F (36.2 C) (02/04 0400) Pulse Rate:  [45-236] 72 (02/04 0800) Resp:  [14-42] 18 (02/04 0800) BP: (83-116)/(31-68) 107/61 (02/04 0800) SpO2:  [94 %-100 %] 99 % (02/04 0800) Last BM Date: 12/01/18  Intake/Output from previous day: 02/03 0701 - 02/04 0700 In: 4402 [P.O.:120; I.V.:3737.2; IV Piggyback:544.9] Out: 1950 [Urine:1950] Intake/Output this shift: Total I/O In: 269.2 [I.V.:269.2] Out: -   PE: Gen:  Alert, NAD, pleasant HEENT: EOM's intact, pupils equal and round Pulm:  effort normal Abd: Soft, NT/ND Skin: warm and dry GU: dressing to sacral wound  Lab Results:  Recent Labs    12/01/18 0143 12/02/18 0500  WBC 5.3 4.8  HGB 9.2* 9.8*  HCT 28.8* 30.8*  PLT 155 162   BMET Recent Labs    12/01/18 0143 12/02/18 0500  NA 137 139  K 2.9* 3.1*  CL 114* 114*  CO2 19* 21*  GLUCOSE 98 139*  BUN 10 8  CREATININE 0.48 0.46  CALCIUM 6.7* 6.9*   PT/INR No results for input(s): LABPROT, INR in the last 72 hours. CMP     Component Value Date/Time   NA 139 12/02/2018 0500   K 3.1 (L) 12/02/2018 0500   CL 114 (H) 12/02/2018 0500   CO2 21 (L) 12/02/2018 0500   GLUCOSE 139 (H) 12/02/2018 0500   BUN 8 12/02/2018 0500   CREATININE 0.46 12/02/2018 0500   CALCIUM 6.9 (L) 12/02/2018 0500   PROT 4.5 (L) 12/02/2018 0500   ALBUMIN 1.5 (L) 12/02/2018 0500   AST 44 (H) 12/02/2018 0500   ALT 36 12/02/2018 0500   ALKPHOS 140 (H) 12/02/2018 0500   BILITOT 0.2 (L) 12/02/2018 0500   GFRNONAA >60 12/02/2018 0500   GFRAA >60 12/02/2018 0500   Lipase  No results found for: LIPASE     Studies/Results: No results found.  Anti-infectives: Anti-infectives (From admission, onward)   Start     Dose/Rate Route Frequency  Ordered Stop   11/30/18 1200  piperacillin-tazobactam (ZOSYN) IVPB 3.375 g     3.375 g 12.5 mL/hr over 240 Minutes Intravenous Every 8 hours 11/30/18 1133     11/29/18 1200  vancomycin (VANCOCIN) IVPB 1000 mg/200 mL premix  Status:  Discontinued     1,000 mg 200 mL/hr over 60 Minutes Intravenous Every 24 hours 11/28/18 1304 11/29/18 0913   11/28/18 1800  ceFEPIme (MAXIPIME) 2 g in sodium chloride 0.9 % 100 mL IVPB  Status:  Discontinued     2 g 200 mL/hr over 30 Minutes Intravenous Every 12 hours 11/28/18 1304 11/30/18 1117   11/28/18 1200  vancomycin (VANCOCIN) 1,250 mg in sodium chloride 0.9 % 250 mL IVPB  Status:  Discontinued     1,250 mg 166.7 mL/hr over 90 Minutes Intravenous Every 48 hours 11/26/18 1715 11/28/18 1304   11/27/18 0600  ceFEPIme (MAXIPIME) 1 g in sodium chloride 0.9 % 100 mL IVPB  Status:  Discontinued     1 g 200 mL/hr over 30 Minutes Intravenous Every 12 hours 11/26/18 1715 11/28/18 1304   11/26/18 1430  ceFEPIme (MAXIPIME) 2 g in sodium chloride 0.9 % 100 mL IVPB     2 g 200 mL/hr over 30 Minutes Intravenous STAT 11/26/18 1429 11/26/18 1604   11/26/18  1430  vancomycin (VANCOCIN) IVPB 1000 mg/200 mL premix     1,000 mg 200 mL/hr over 60 Minutes Intravenous STAT 11/26/18 1429 11/26/18 1621       Assessment/Plan Parkinson's disease Dementia Anxiety/depression Malnutrition H/o RLE DVT - hold eliquis Septic shock/encephalopathy  Sacral decubitus ulcer with osteomyelitis - OR today for debridement.  ID -maxipime/vancomycin 1/29>>2/2, zosyn 2/2>> VTE -SCDs, IV heparin on hold for procedure FEN - NPO for procedure Follow up -TBD   LOS: 6 days    [name] , [facility] 12/02/2018, 8:25 AM Pager: [phone] Mon-Thurs 7:00 am-4:30 pm Fri 7:00 am -11:30 AM Sat-Sun 7:00 am-11:30 am

## 2018-12-02 NOTE — Op Note (Addendum)
12/02/2018  3:11 PM  PATIENT:  Becky JuneauPatricia P Figueroa  73 y.o. female  Patient Care Team: Richmond CampbellKaplan, Kristen W., PA-C as PCP - General (Family Medicine)  PRE-OPERATIVE DIAGNOSIS:  SACRAL DECUBITUS  POST-OPERATIVE DIAGNOSIS:  SACRAL DECUBITUS WITH OSTEOMYELITIS  PROCEDURE:  EXCISIONAL DEBRIDEMENT SACRAL DECUBITUS  SURGEON:  Ardeth SportsmanSteven C. Hien Perreira, MD  ASSISTANT: RN   ANESTHESIA:   general  EBL:  Total I/O In: 705.4 [I.V.:269.2; IV Piggyback:436.2] Out: -   Delay start of Pharmacological VTE agent (>24hrs) due to surgical blood loss or risk of bleeding:  no  DRAINS: none   SPECIMEN:   1.  Sacral bone and periosteum 2.  Necrotic skin, subcutaneous tissue, gluteal muscle  DISPOSITION OF SPECIMEN:  PATHOLOGY  COUNTS:  YES  PLAN OF CARE: Admit to inpatient   PATIENT DISPOSITION:  PACU - hemodynamically stable.  INDICATION: Obese elderly woman with worsening dimension and Parkinson's disease.  Full anticoagulation for DVT.  Developed large sacral decubitus.  Had worsening lethargy necrosis and sepsis I result of the worsening decubitus.  Hospitalization.  Stabilization, antibiotics, and bedside debridement done on admission.  Some medical stabilization but worsening tissue necrosis and persistent infection.  Recommendation made for operative debridement  The anatomy and physiology of skin abscesses was discussed. Pathophysiology of SQ abscess, possible progression to fasciitis & sepsis, etc discussed . I stressed good hygiene & wound care. Possible redebridement was discussed as well.   Possibility of recurrence was discussed. Risks, benefits, alternatives were discussed. I noted a good likelihood this will help address the problem. Risks of anesthesia and other risks discussed. Questions answered. The patient is does wish to proceed.   OR FINDINGS:   Very large skin wound over sacral decubitus with regions of necrosis of skin flaps.  Necrotic fat and subcutaneous tissues with gangrene.   Necrotic bilateral gluteal muscles undermining tracts going down to lateral deep gluteal muscle.  Crumbling sacrum in the midline with dead periosteum.  Aggressively debrided.  40g total excised  Resulting main wound is 15 cm wide by 9 cm tall.  Undermining tracks goes 7 cm along left gluteus towards the thigh.  Tracking on the right gluteus goes down 9 cm.  Some necrotic superficial and mid gluteal muscle sharply debrided away.  Resulting wound below much more clean       DESCRIPTION:   Informed consent was confirmed. The patient received IV antibiotics. The patient underwent general anesthesia without any difficulty. The patient was positioned in prone jackknife positioning.  She has been fully anticoagulation and anticoagulation was appropriately held 6 hours before. The area around the abscess was prepped and draped in a sterile fashion. A surgical timeout confirmed our plan.   I used sharp curved Mayo scissors to excise large chunks of necrotic fat and muscle out of the foul-smelling wound.  Debrided until I came to more healthy bleeding deeper tissue.  I could easily probe in the necrotic gluteal fat along the buttocks more laterally towards the thighs and a deeper plane.  Debrided some dead muscle in this region.  No definite evidence of necrotizing fasciitis.  I used a rongeur to debride off crumbling bone on the mid sacrum and coccyx.  Debrided down to more healthy bleeding bone tissue.  I assured hemostasis.  Sharply freed off frankly dead and somewhat gangrenous periosteum as well.  I excised some necrotic skin flaps as well.  I assured hemostasis.  I did reexploration completely at all levels and assured no more significant remaining tissue.  I assured hemostasis.  I packed the wound with a large Kerlix wrap.  I used corners going down into the deep gluteal wounds laterally and save the central part of the Kerlix mesh to pack the sacral decubitus wound.  Hemostasis was good.  Patient  being extubated recovery room.  I discussed intraoperative findings and postop regulations the patient's family.  Questions answered.  They expressed understanding appreciation.  Okay to resume anticoagulation 12 hours postop.  No bolus on the IV heparin   Ardeth Sportsman, M.D., F.A.C.S. Gastrointestinal and Minimally Invasive Surgery [facility] [address], [address] [place], [place] [address] [phone] Main / Paging

## 2018-12-02 NOTE — Transfer of Care (Signed)
Immediate Anesthesia Transfer of Care Note  Patient: Becky Figueroa  Procedure(s) Performed: IRRIGATION AND DEBRIDEMENT SACRAL DECUBITUS (N/A Back)  Patient Location: PACU  Anesthesia Type:General  Level of Consciousness: awake, alert , oriented and patient cooperative  Airway & Oxygen Therapy: Patient Spontanous Breathing and Patient connected to face mask oxygen  Post-op Assessment: Report given to RN and Post -op Vital signs reviewed and stable  Post vital signs: Reviewed and stable  Last Vitals:  Vitals Value Taken Time  BP 139/76 12/02/2018  3:30 PM  Temp    Pulse 88 12/02/2018  3:32 PM  Resp 16 12/02/2018  3:32 PM  SpO2 100 % 12/02/2018  3:32 PM  Vitals shown include unvalidated device data.  Last Pain:  Vitals:   12/02/18 1212  TempSrc:   PainSc: 6          Complications: No apparent anesthesia complications

## 2018-12-02 NOTE — Anesthesia Preprocedure Evaluation (Addendum)
Anesthesia Evaluation  Patient identified by MRN, date of birth, ID band Patient awake    Reviewed: Allergy & Precautions, NPO status , Patient's Chart, lab work & pertinent test results  Airway Mallampati: IV  TM Distance: >3 FB Neck ROM: Full    Dental  (+) Edentulous Upper   Pulmonary neg pulmonary ROS,    Pulmonary exam normal breath sounds clear to auscultation       Cardiovascular + DVT (on eliquis)  Normal cardiovascular exam Rhythm:Regular Rate:Normal  TTE 10/2018  1. The left ventricle has normal systolic function of 60-65%. The cavity size is normal. There is no left ventricular wall thickness. Echo evidence of normal diastolic filling patterns.  2. Normal left atrial size.  3. Normal right atrial size.  4. Normal tricuspid valve.  5. No atrial level shunt detected by color flow Doppler.   Neuro/Psych PSYCHIATRIC DISORDERS Anxiety Depression Dementia Parkinson's negative neurological ROS     GI/Hepatic Neg liver ROS, hiatal hernia, GERD  Medicated and Controlled,  Endo/Other  negative endocrine ROS  Renal/GU negative Renal ROS  negative genitourinary   Musculoskeletal negative musculoskeletal ROS (+)   Abdominal   Peds  Hematology  (+) Blood dyscrasia, anemia ,   Anesthesia Other Findings Sacral decubitius wound  Reproductive/Obstetrics negative OB ROS                          Anesthesia Physical Anesthesia Plan  ASA: III  Anesthesia Plan: General   Post-op Pain Management:    Induction: Intravenous  PONV Risk Score and Plan: 3 and Ondansetron, Dexamethasone and Treatment may vary due to age or medical condition  Airway Management Planned: Oral ETT  Additional Equipment:   Intra-op Plan:   Post-operative Plan: Extubation in OR  Informed Consent: I have reviewed the patients History and Physical, chart, labs and discussed the procedure including the risks, benefits  and alternatives for the proposed anesthesia with the patient or authorized representative who has indicated his/her understanding and acceptance.     Dental advisory given  Plan Discussed with: CRNA  Anesthesia Plan Comments:        Anesthesia Quick Evaluation

## 2018-12-02 NOTE — Anesthesia Postprocedure Evaluation (Signed)
Anesthesia Post Note  Patient: Becky Figueroa  Procedure(s) Performed: IRRIGATION AND DEBRIDEMENT SACRAL DECUBITUS (N/A Back)     Patient location during evaluation: PACU Anesthesia Type: General Level of consciousness: awake Pain management: pain level controlled Vital Signs Assessment: post-procedure vital signs reviewed and stable Cardiovascular status: stable Postop Assessment: no apparent nausea or vomiting Anesthetic complications: no    Last Vitals:  Vitals:   12/02/18 1545 12/02/18 1550  BP:    Pulse: 84 84  Resp: 17 15  Temp:    SpO2: 100% 100%    Last Pain:  Vitals:   12/02/18 1530  TempSrc:   PainSc: 7                  Kaedynce Tapp

## 2018-12-02 NOTE — Progress Notes (Addendum)
NAME:  Becky Figueroa, MRN:  [id number], DOB:  [birth year], LOS: 6 ADMISSION DATE:  11/26/2018, CONSULTATION DATE:  2/1 REFERRING MD:  Dr Ashley Royalty, CHIEF COMPLAINT:  Septic Shock   Brief History   73 year old woman with Parkinson's disease, history right LE DVT, admitted with septic shock due to large decubitus ulcer and osteomyelitis.  History of present illness   73 year old woman with Parkinson's disease and dementia, depression, right lower extremity DVT on anticoagulation.  She has been dealing with a large sacral decubitus ulcer, receiving wound care as an outpatient.  She was admitted with lethargy, toxic metabolic encephalopathy in the setting of apparent severe sepsis and osteomyelitis.  MRI of the spine and sacrum 1/31 confirm the osteomyelitis and surrounding myositis.  She had acute renal insufficiency and hyponatremia which have improved with volume resuscitation.  Started on broad-spectrum antibiotics, now on cefepime alone.  She is 13.2 L positive for the admission so far.  2/1 she has developed progressive hypotension.  She received low-dose Lasix earlier in the day (only 10 mg).   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   Significant Hospital Events     Consults:  General surgery Wound care Infectious diseases Cardiology   Procedures:    Significant Diagnostic Tests:  Head CT 1/29 >> no acute abnormality MRI sacrum 1/31 >> osteomyelitis of the lower sacrum and coccyx associated with large decubitus ulcer, diffuse gluteal edema and presacral edema compatible with myositis.  No pyomyositis seen Echocardiogram 2/1 >>  normal left ventricular size and function, EF 60 to 65%, no pericardial effusion, normal valvular function  Micro Data:  Blood 1/29 >> No growth  Antimicrobials:  Vancomycin 1/29 > 1/31 Cefepime 1/29 >>   Interim history/subjective:  No events overnight, remains on 23 mcg of neo, 2 L [place] + 13.2 L since admission Afebrile Repletion of potassium and Mag per Dr. Ashley Royalty For debridement in OR 2/4  Objective   Blood pressure 107/61, pulse 72, temperature 97.6 F (36.4 C), temperature source Oral, resp. rate 18, height 5\' 6"  (1.676 m), weight 59 kg, SpO2 99 %.        Intake/Output Summary (Last 24 hours) at 12/02/2018 0855 Last data filed at 12/02/2018 0800 Gross per 24 hour  Intake 4671.23 ml  Output 1950 ml  Net 2721.23 ml   Filed Weights   11/26/18 2033 11/27/18 1400  Weight: 59 kg 59 kg    Examination: General: Chronically ill appearing female, weak, NAD HENT: [place]/AT, PERRL, EOM-I and MMM Lungs: CTA bilaterally, diminished per bases, on RA Cardiovascular: RRR, Nl S1/S2 and -M/R/G Abdomen: Soft, NT, ND and +BS, Body mass index is 20.99 kg/m. Extremities: 1+ pitting edema, warm and dry, no obvious deformities Neuro: Awake and interactive, following basic commands, very weak and deconditioned Skin: WOC exam reviewed, intact other than decub  Resolved Hospital Problem list   Acute renal failure Hyponatremia  Assessment & Plan:  Septic shock.  Source is sacral osteomyelitis with surrounding myositis due to decubitus ulcer. -Volume resuscitation as she can tolerate.  This been some concern that she may be developing evidence for pulmonary edema.  Lasix was given earlier 2/1 but the dose was low and I do not think it necessarily precipitated  her current hypotension.  Hold off any further diuresis for now - Continue neo peripherally, weaned to 23 mcg/ min on  2/4 - Cortisol level 15.8 >> continue  stress dose steroids - Continue zosyn for now - CCS taking for  Debridement 2/4.   Her anticoagulation is being held to facilitate - Culture as is clinically indicated - Trend WBC, fever - Will re-check PCT with continued hypotension - Will re-culture as continued hypotension   Positive I&O Balance + 13 L  Plan CXR in am Consider lasix and albumin after surgery  Toxic metabolic encephalopathy due to sepsis and metabolic disarray - D/C all sedating medications - Monitor clinically - Frequent reorientation  History of right lower extremity DVT. - Has been treated with Eliquis, currently on hold to facilitate bedside debridement.  Covering with heparin infusion  History of Parkinson's and dementia - On Sinemet   Relative anemia - Trend CBC  PCCM will continue to follow while pressors are needed.  Best practice:  Diet: Regular Pain/Anxiety/Delirium protocol (if indicated): Ultram VAP protocol (if indicated): N/A DVT prophylaxis: Heparin drip GI prophylaxis: N/A Glucose control: NA Mobility: Bedrest Code Status: Full Family Communication:  Disposition: SDU, may need to move to ICU depending on persistence of her hypotension  Labs   CBC: Recent Labs  Lab 11/26/18 1232  11/28/18 0243 11/29/18 0415 11/30/18 0334 12/01/18 0143 12/02/18 0500  WBC 13.1*   < > 5.8 17.0* 3.9* 5.3 4.8  NEUTROABS 11.8*  --   --   --   --   --   --   HGB 10.9*   < > 8.8* 8.6* 9.1* 9.2* 9.8*  HCT 33.3*   < > 28.1* 29.0* 28.8* 28.8* 30.8*  MCV 95.1   < > 100.0 87.1 98.0 95.4 94.8  PLT 127*   < > 125* 367 122* 155 162   < > = values in this interval not displayed.    Basic Metabolic Panel: Recent Labs  Lab 11/28/18 0243 11/29/18 0415 11/30/18 0334 12/01/18 0143 12/02/18 0500  NA 136 145 136 137 139  K 3.4* 3.8 2.5* 2.9* 3.1*  CL 113* 111 112* 114* 114*  CO2 19* 24 18* 19* 21*  GLUCOSE 101* 125* 94 98 139*  BUN 27* 21 10 10 8   CREATININE 0.86 0.87 0.49 0.48 0.46  CALCIUM 6.7* 7.7* 6.7* 6.7* 6.9*  MG  --   --   --   --  1.6*  PHOS  --   --   --   --  2.0*    GFR: Estimated Creatinine Clearance: 59.2 mL/min (by C-G formula based on SCr of 0.46 mg/dL). Recent Labs  Lab 11/26/18 1502  11/29/18 0415 11/29/18 1722 11/30/18 0334 12/01/18 0143 12/02/18 0500  PROCALCITON  --   --   --  0.35 0.32 0.37  --   WBC  --    < > 17.0*  --  3.9* 5.3 4.8  LATICACIDVEN 1.0  --   --  1.6  --   --   --    < > = values in this interval not displayed.    Liver Function Tests: Recent Labs  Lab 11/26/18 1232 11/27/18 0530 12/01/18 0143 12/02/18 0500  AST 37 47* 82* 44*  ALT 24 22 29  36  ALKPHOS 137* 123 133* 140*  BILITOT 1.2 1.0 0.3 0.2*  PROT 6.6 6.1* 4.5* 4.5*  ALBUMIN 2.6* 2.3* 1.5* 1.5*   No results for input(s): LIPASE, AMYLASE in the last 168  hours. No results for input(s): AMMONIA in the last 168 hours.  ABG    Component Value Date/Time   PHART 7.452 (H) 11/26/2018 1430   PCO2ART 36.1 11/26/2018 1430   PO2ART 70.1 (L) 11/26/2018 1430   HCO3 24.8 11/26/2018 1430   TCO2 27 11/02/2018 1242   O2SAT 93.7 11/26/2018 1430     Coagulation Profile: No results for input(s): INR, PROTIME in the last 168 hours.  Cardiac Enzymes: No results for input(s): CKTOTAL, CKMB, CKMBINDEX, TROPONINI in the last 168 hours.  HbA1C: No results found for: HGBA1C  CBG: Recent Labs  Lab 11/26/18 1358  GLUCAP 121*    Review of Systems:   Denies pain. Marginal appetite. 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   Surgical History    Past Surgical History:  Procedure Laterality Date  . ANAL FISSURECTOMY  1994   and Hemorrhoidectomy  . BOTOX INJECTION N/A 11/18/2013    Procedure: BOTOX INJECTION;  Surgeon: Hart Carwinora M Brodie, MD;  Location: [facility];  Service: Endoscopy;  Laterality: N/A;  . CHOLECYSTECTOMY OPEN  1982  . CYSTOSCOPY WITH RETROGRADE PYELOGRAM, URETEROSCOPY AND STENT PLACEMENT Left 06/06/2016   Procedure: CYSTOSCOPY WITH RETROGRADE PYELOGRAM, URETEROSCOPY AND STENT PLACEMENT;  Surgeon: Barron Alvineavid Grapey, MD;  [facility];  Service: Urology;  Laterality: Left;  . FLEXIBLE SIGMOIDOSCOPY N/A 11/18/2013   Procedure: FLEXIBLE SIGMOIDOSCOPY/ with BOTOX;  Surgeon: Hart Carwinora M Brodie, MD;  Location: [facility];  Service: Endoscopy;  Laterality: N/A;  . HOLMIUM LASER APPLICATION Left 06/06/2016   Procedure: HOLMIUM LASER APPLICATION;  Surgeon: Barron Alvineavid Grapey, MD;  [facility];  Service: Urology;  Laterality: Left;  . TONSILLECTOMY  age 73  . TOTAL ABDOMINAL HYSTERECTOMY W/ BILATERAL SALPINGOOPHORECTOMY  1984   and Appendectomy     Social History   reports that she has never smoked. She has never used smokeless tobacco. She reports that she does not drink alcohol or use drugs.   Family History   Her family history includes Breast cancer in her maternal aunt; Diabetes in her brother; Pancreatic cancer (age of onset: 10656) in her maternal uncle. There is no history of Colon cancer.   Allergies Allergies  Allergen Reactions  . Sinemet [Carbidopa W-Levodopa] Nausea And Vomiting    Able to tolerate with Zofran  . Sulfa Antibiotics Other (See Comments)    Flushing and hot     Home Medications  Prior to Admission medications   Medication Sig Start Date End Date Taking? Authorizing Provider  ALPRAZolam Prudy Feeler(XANAX) 0.5 MG tablet Take 1 tablet (0.5 mg total) by mouth 3 (three) times daily as needed for anxiety. 11/07/18  Yes Gwyneth SproutPlunkett, Whitney, MD  apixaban (ELIQUIS) 5 MG TABS tablet Take 10 mg by mouth 2 (two) times daily.   Yes [provider]  carbidopa-levodopa (SINEMET IR) 25-100 MG tablet 1/2 tablet twice a day  for 3 weeks, then take 1/2 tablet three times a day Patient taking differently: Take 0.5 tablets by mouth 3 (three) times daily.  09/18/18  Yes York SpanielWillis, Charles K, MD  ciprofloxacin (CIPRO) 500 MG tablet Take 500 mg by mouth 2 (two) times daily.   Yes [provider]  lansoprazole (PREVACID) 30  MG capsule Take 30 mg by mouth every morning.    Yes [provider]  Multiple Vitamins-Minerals (ONE-A-DAY WOMENS 50 PLUS PO) Take 1 tablet by mouth daily.   Yes [provider]  Nutritional Supplements (RESOURCE 2.0) LIQD Take 120 mLs by mouth 2 (two) times daily.   Yes [provider]  NUTRITIONAL SUPPLEMENTS PO Take 1 Units by mouth daily at 12 noon. "Magic Cup."   Yes [provider]  ondansetron (ZOFRAN) 4 MG tablet Take 1 tablet (4 mg total) by mouth every 8 (eight) hours as needed for nausea or vomiting. Patient taking differently: Take 4 mg by mouth every 8 (eight) hours. Takes with carbidopa-levodopa 01/16/18  Yes Huston Foley, MD  traMADol (ULTRAM) 50 MG tablet Take 50 mg by mouth every 6 (six) hours as needed for moderate pain.    Yes [provider]  fluconazole (DIFLUCAN) 100 MG tablet Take 1 tablet (100 mg total) by mouth daily. Patient not taking: Reported on 11/26/2018 11/06/18   Darlin Drop, DO    Bevelyn Ngo, AGACNP-BC [facility] Pulmonary/Critical Care Medicine Pager # [phone]. After 4 pm call [phone] 12/02/2018 8:56 AM  Attending Note:  73 year old female with a sacral decub infection that requires additional debridement today causing sepsis and hypotension.  No events overnight but continues to need neo and is actually increased in dose.  On exam, she is alert and interactive with bibasilar crackles.  I reviewed CXR myself, mild pulmonary edema noted.  Discussed with PCCM-NP.  Will continue stress dose steroids.  Reculture.  Continue abx.  Check PCT.  Post op if remains stable then will consider albumin and lasix.  PCCM  will continue to follow.  The patient is critically ill with multiple organ systems failure and requires high complexity decision making for assessment and support, frequent evaluation and titration of therapies, application of advanced monitoring technologies and extensive interpretation of multiple databases.   Critical Care Time devoted to patient care services described in this note is  31  Minutes. This time reflects time of care of this signee Dr Koren Bound. This critical care time does not reflect procedure time, or teaching time or supervisory time of PA/NP/Med student/Med Resident etc but could involve care discussion time.  Alyson Reedy, M.D. [facility] Pulmonary/Critical Care Medicine. Pager: [phone]. After hours pager: [phone].

## 2018-12-02 NOTE — Progress Notes (Signed)
PT Cancellation Note  Patient Details Name: Becky Figueroa MRN: [id number] DOB: 1946/03/10   Cancelled Treatment:    Reason Eval/Treat Not Completed: Other (comment)For surgical debridement today. PLEASE reorder Hydrotherapy if indicated.    Rada Hay 12/02/2018, 6:57 AM Blanchard Kelch PT Acute Rehabilitation Services Pager [phone] Office [phone]
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
## 2018-12-03 ENCOUNTER — Inpatient Hospital Stay (HOSPITAL_COMMUNITY): Payer: Medicare Other

## 2018-12-03 ENCOUNTER — Encounter (HOSPITAL_COMMUNITY): Payer: Self-pay | Admitting: Surgery

## 2018-12-03 DIAGNOSIS — I959 Hypotension, unspecified: Secondary | ICD-10-CM

## 2018-12-03 DIAGNOSIS — L8994 Pressure ulcer of unspecified site, stage 4: Secondary | ICD-10-CM

## 2018-12-03 DIAGNOSIS — J810 Acute pulmonary edema: Secondary | ICD-10-CM

## 2018-12-03 DIAGNOSIS — L89154 Pressure ulcer of sacral region, stage 4: Secondary | ICD-10-CM

## 2018-12-03 LAB — CBC
HCT: 30 % — ABNORMAL LOW (ref 36.0–46.0)
Hemoglobin: 9.5 g/dL — ABNORMAL LOW (ref 12.0–15.0)
MCH: 30.2 pg (ref 26.0–34.0)
MCHC: 31.7 g/dL (ref 30.0–36.0)
MCV: 95.2 fL (ref 80.0–100.0)
Platelets: 174 10*3/uL (ref 150–400)
RBC: 3.15 MIL/uL — ABNORMAL LOW (ref 3.87–5.11)
RDW: 14.8 % (ref 11.5–15.5)
WBC: 8 10*3/uL (ref 4.0–10.5)
nRBC: 0 % (ref 0.0–0.2)

## 2018-12-03 LAB — COMPREHENSIVE METABOLIC PANEL
ALT: 23 U/L (ref 0–44)
AST: 31 U/L (ref 15–41)
Albumin: 1.7 g/dL — ABNORMAL LOW (ref 3.5–5.0)
Alkaline Phosphatase: 134 U/L — ABNORMAL HIGH (ref 38–126)
Anion gap: 3 — ABNORMAL LOW (ref 5–15)
BILIRUBIN TOTAL: 0.5 mg/dL (ref 0.3–1.2)
BUN: 9 mg/dL (ref 8–23)
CO2: 23 mmol/L (ref 22–32)
Calcium: 7.3 mg/dL — ABNORMAL LOW (ref 8.9–10.3)
Chloride: 114 mmol/L — ABNORMAL HIGH (ref 98–111)
Creatinine, Ser: 0.5 mg/dL (ref 0.44–1.00)
GFR calc Af Amer: >60 mL/min (ref >60)
GFR calc non Af Amer: >60 mL/min (ref >60)
Glucose, Bld: 138 mg/dL — ABNORMAL HIGH (ref 70–99)
Potassium: 3.6 mmol/L (ref 3.5–5.1)
Sodium: 140 mmol/L (ref 135–145)
Total Protein: 4.5 g/dL — ABNORMAL LOW (ref 6.5–8.1)

## 2018-12-03 LAB — MAGNESIUM: Magnesium: 1.8 mg/dL (ref 1.7–2.4)

## 2018-12-03 LAB — PROCALCITONIN: PROCALCITONIN: 0.13 ng/mL

## 2018-12-03 MED ORDER — OXYCODONE-ACETAMINOPHEN 5-325 MG PO TABS
2.0000 | ORAL_TABLET | Freq: Once | ORAL | Status: AC
  Administered 2018-12-03: 2 via ORAL
  Filled 2018-12-03: qty 2

## 2018-12-03 MED ORDER — ALBUMIN HUMAN 25 % IV SOLN
25.0000 g | Freq: Four times a day (QID) | INTRAVENOUS | Status: AC
  Administered 2018-12-03 – 2018-12-04 (×4): 25 g via INTRAVENOUS
  Filled 2018-12-03 (×2): qty 50
  Filled 2018-12-03 (×3): qty 100

## 2018-12-03 MED ORDER — FENTANYL CITRATE (PF) 100 MCG/2ML IJ SOLN
12.5000 ug | Freq: Four times a day (QID) | INTRAMUSCULAR | Status: DC | PRN
  Administered 2018-12-03 – 2018-12-12 (×12): 12.5 ug via INTRAVENOUS
  Filled 2018-12-03 (×12): qty 2

## 2018-12-03 MED ORDER — ORAL CARE MOUTH RINSE
15.0000 mL | Freq: Two times a day (BID) | OROMUCOSAL | Status: DC
  Administered 2018-12-03 – 2018-12-16 (×22): 15 mL via OROMUCOSAL

## 2018-12-03 MED ORDER — POTASSIUM CHLORIDE CRYS ER 20 MEQ PO TBCR
40.0000 meq | EXTENDED_RELEASE_TABLET | Freq: Three times a day (TID) | ORAL | Status: AC
  Administered 2018-12-03 (×2): 40 meq via ORAL
  Filled 2018-12-03 (×2): qty 2

## 2018-12-03 MED ORDER — FUROSEMIDE 10 MG/ML IJ SOLN
20.0000 mg | Freq: Four times a day (QID) | INTRAMUSCULAR | Status: AC
  Administered 2018-12-03 (×3): 20 mg via INTRAVENOUS
  Filled 2018-12-03 (×3): qty 2

## 2018-12-03 MED ORDER — OXYCODONE-ACETAMINOPHEN 5-325 MG PO TABS: 1.0000 | ORAL_TABLET | Freq: Four times a day (QID) | ORAL | Status: DC | PRN

## 2018-12-03 MED ORDER — TRAMADOL HCL 50 MG PO TABS
50.0000 mg | ORAL_TABLET | Freq: Four times a day (QID) | ORAL | Status: DC | PRN
  Administered 2018-12-04 – 2018-12-14 (×8): 50 mg via ORAL
  Filled 2018-12-03 (×10): qty 1

## 2018-12-03 MED ORDER — OXYCODONE-ACETAMINOPHEN 5-325 MG PO TABS
1.0000 | ORAL_TABLET | Freq: Four times a day (QID) | ORAL | Status: DC | PRN
  Administered 2018-12-03 – 2018-12-05 (×5): 1 via ORAL
  Filled 2018-12-03 (×5): qty 1

## 2018-12-03 MED ORDER — SILVER NITRATE-POT NITRATE 75-25 % EX MISC
10.0000 "application " | Freq: Once | CUTANEOUS | Status: DC | PRN
  Filled 2018-12-03: qty 10

## 2018-12-03 MED ORDER — SODIUM CHLORIDE 0.9 % IV SOLN: INTRAVENOUS | Status: DC | PRN

## 2018-12-03 NOTE — Progress Notes (Addendum)
ANTICOAGULATION CONSULT NOTE - Follow Up Consult  Pharmacy Consult for Heparin Indication: DVT (PTA apixaban on hold)  Allergies  Allergen Reactions  . Sinemet [Carbidopa W-Levodopa] Nausea And Vomiting    Able to tolerate with Zofran  . Sulfa Antibiotics Other (See Comments)    Flushing and hot    Patient Measurements: Height: 5\' 6"  (167.6 cm) Weight: 130 lb 1.1 oz (59 kg) IBW/kg (Calculated) : 59.3 Heparin Dosing Weight: TBW  Vital Signs: Temp: 97.5 F (36.4 C) (02/05 0800) Temp Source: Axillary (02/05 0800) Pulse Rate: 91 (02/05 0805)  Labs: Recent Labs    12/01/18 0143 12/01/18 1102 12/02/18 0500 12/02/18 0528 12/02/18 1445 12/03/18 0412  HGB 9.2*  --  9.8*  --  9.5* 9.5*  HCT 28.8*  --  30.8*  --  28.0* 30.0*  PLT 155  --  162  --   --  174  HEPARINUNFRC 0.33 0.34  --  <0.10*  --   --   CREATININE 0.48  --  0.46  --   --  0.50    Estimated Creatinine Clearance: 59.2 mL/min (by C-G formula based on SCr of 0.5 mg/dL).   Medications:  Infusions:  . sodium chloride Stopped (11/29/18 1737)  . albumin human    . phenylephrine (NEO-SYNEPHRINE) Adult infusion 30 mcg/min (12/03/18 0434)  . piperacillin-tazobactam (ZOSYN)  IV 3.375 g (12/03/18 0507)    Assessment: 72 yoF admitted on 1/29 with osteomyelitis of sacral decubitus ulcer with surgical consult for debridement. Pt was recently diagnosed with RLE DVT and started on Eliquis. Pharmacy is consulted to dose Heparin IV while PTA Eliquis for DVT is on hold.   Significant events: 1/29 Last apixaban dose taken PTA 1/30 Heparin held at 08:00 for debridement, resumed heparin s/p procedure 1/31-2/1 overnight, pt pulled out line while in MRI, heparin off ~ 1-2 hours 2/4 Heparin off at 02:00 prior to OR debridement.  AET 1533.  Heparin to resume 12 hrs postop per Dr. Michaell CowingGross, no bolus. 2/5 Heparin resumed 03:30.  Heparin held ~ 08:00 per RN for oozing/bleeding from wound.  Per CCS, Hold anticoagulation for 24 hours.    Today, 12/03/2018:  Heparin level canceled as heparin remains on hold.  CBC:  Hgb remains low/stable at 9.5, Plt WNL  SCr significantly improved since admission  See CCS notes regarding wound bleeding.  No IV or line issues per RN.   Goal of Therapy:  Heparin level 0.3-0.7 units/ml aPTT 66-102 seconds Monitor platelets by anticoagulation protocol: Yes   Plan:  Holding Heparin for 24 hours (09:00 2/5 - 09:00 2/6) On 2/6 at 09:00 resume heparin IV infusion at 1250 units/hr, NO bolus Check heparin level 8 hours after starting. Daily Heparin level and CBC  Follow up plans for further debridement (hold heparin 4 hours per previous notes prior to further I&D)   Lynann Beaverhristine Johnny Gorter PharmD, BCPS Pager [phone] 12/03/2018 9:26 AM
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
## 2018-12-03 NOTE — Progress Notes (Signed)
NAME:  Becky Figueroa, MRN:  [id number], DOB:  1945/11/14, LOS: 7 ADMISSION DATE:  11/26/2018, CONSULTATION DATE:  2/1 REFERRING MD:  Dr Ashley RoyaltyMatthews, CHIEF COMPLAINT:  Septic Shock   Brief History   73 year old woman with Parkinson's disease, history right LE DVT, admitted with septic shock due to large decubitus ulcer and osteomyelitis.  History of present illness   73 year old woman with Parkinson's disease and dementia, depression, right lower extremity DVT on anticoagulation.  She has been dealing with a large sacral decubitus ulcer, receiving wound care as an outpatient.  She was admitted with lethargy, toxic metabolic encephalopathy in the setting of apparent severe sepsis and osteomyelitis.  MRI of the spine and sacrum 1/31 confirm the osteomyelitis and surrounding myositis.  She had acute renal insufficiency and hyponatremia which have improved with volume resuscitation.  Started on broad-spectrum antibiotics, now on cefepime alone.  She is 13.2 L positive for the admission so far.  2/1 she has developed progressive hypotension.  She received low-dose Lasix earlier in the day (only 10 mg).   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   Significant Hospital Events     Consults:  General surgery Wound care Infectious diseases Cardiology   Procedures:    Significant Diagnostic Tests:  Head CT 1/29 >> no acute abnormality MRI sacrum 1/31 >> osteomyelitis of the lower sacrum and coccyx associated with large decubitus ulcer, diffuse gluteal edema and presacral edema compatible with myositis.  No pyomyositis seen Echocardiogram 2/1 >>  normal left ventricular size and function, EF 60 to 65%, no pericardial effusion, normal valvular function  Micro Data:  Blood 1/29 >> No growth  Antimicrobials:  Vancomycin 1/29 > 1/31 Cefepime 1/29 >>   Interim history/subjective:  Remains on low dose pressors overnight Able to drop dose some this AM  Objective   Blood pressure 125/83, pulse 91, temperature (!) 97.5 F (36.4 C), temperature source Axillary, resp. rate (!) 22, height 5\' 6"  (1.676 m), weight 59 kg, SpO2 99 %.        Intake/Output Summary (Last 24 hours) at 12/03/2018 0856 Last data filed at 12/03/2018 [id number] Gross per 24 hour  Intake 1996.2 ml  Output 650 ml  Net 1346.2 ml   Filed Weights   11/26/18 2033 11/27/18 1400 12/02/18 1212  Weight: 59 kg 59 kg 59 kg    Examination: General: Chronically ill appearing female, weak, NAD HENT: [place]/AT, PERRL, EOM-I and MMM Lungs: Crackles noted on exam diffusely Cardiovascular: RRR, Nl S1/S2 and -M/R/G Abdomen: Soft, NT, ND and +BS Extremities: 2+ edema and -tenderness Neuro: Awake and interactive, following basic commands, very weak and deconditioned Skin: WOC exam reviewed, intact other than decub  I reviewed CXR myself, mild pulmonary edema noted  Resolved Hospital Problem list   Acute renal failure Hyponatremia Discussed with TRH-MD  Assessment & Plan:  Septic shock.  Source is sacral osteomyelitis with surrounding myositis due to decubitus ulcer. - KVO IVF - Continue neo peripherally, weaned to 25 mcg/min on 2/5, will change parameters to MAP of 60 or SBP of 90 - Cortisol level 15.8 >> continue stress dose steroids - Continue zosyn for now - CCS following for wound debridement  - Culture as is clinically indicated -  Trend WBC, fever - Will re-culture as continued hypotension   Positive I&O Balance + 13 L  Plan - CXR in am - Lasix 20 mg IV q6 x3 doses - Albumin 25% 25 ml q6 x4 doses - KVO IVF  Toxic metabolic encephalopathy due to sepsis and  metabolic disarray - D/C all sedating medications - Monitor clinically - Frequent reorientation  History of right lower extremity DVT. - Has been treated with Eliquis, currently on hold to facilitate bedside debridement.  Covering with heparin infusion  History of Parkinson's and dementia - On Sinemet   Relative anemia - Trend CBC  PCCM will continue to follow while pressors are needed.  Best practice:  Diet: Regular Pain/Anxiety/Delirium protocol (if indicated): Ultram VAP protocol (if indicated): N/A DVT prophylaxis: Heparin drip GI prophylaxis: N/A Glucose control: NA Mobility: Bedrest Code Status: Full Family Communication:  Disposition: SDU, may need to move to ICU depending on persistence of her hypotension  Labs   CBC: Recent Labs  Lab 11/26/18 1232  11/29/18 0415 11/30/18 0334 12/01/18 0143 12/02/18 0500 12/02/18 1445 12/03/18 0412  WBC 13.1*   < > 17.0* 3.9* 5.3 4.8  --  8.0  NEUTROABS 11.8*  --   --   --   --   --   --   --   HGB 10.9*   < > 8.6* 9.1* 9.2* 9.8* 9.5* 9.5*  HCT 33.3*   < > 29.0* 28.8* 28.8* 30.8* 28.0* 30.0*  MCV 95.1   < > 87.1 98.0 95.4 94.8  --  95.2  PLT 127*   < > 367 122* 155 162  --  174   < > = values in this interval not displayed.    Basic Metabolic Panel: Recent Labs  Lab 11/29/18 0415 11/30/18 0334 12/01/18 0143 12/02/18 0500 12/02/18 1445 12/03/18 0412  NA 145 136 137 139 140 140  K 3.8 2.5* 2.9* 3.1* 3.9 3.6  CL 111 112* 114* 114*  --  114*  CO2 24 18* 19* 21*  --  23  GLUCOSE 125* 94 98 139*  --  138*  BUN 21 10 10 8   --  9  CREATININE 0.87 0.49 0.48 0.46  --  0.50  CALCIUM 7.7* 6.7* 6.7* 6.9*  --  7.3*  MG  --   --   --  1.6*  --  1.8  PHOS  --   --   --  2.0*  --   --    GFR: Estimated Creatinine Clearance: 59.2 mL/min (by C-G formula based on SCr of 0.5 mg/dL). Recent Labs  Lab 11/26/18 1502  11/29/18 1722 11/30/18 0334 12/01/18 0143 12/02/18 0500 12/03/18 0412  PROCALCITON  --   --  0.35 0.32  0.37  --  0.13  WBC  --    < >  --  3.9* 5.3 4.8 8.0  LATICACIDVEN 1.0  --  1.6  --   --   --   --    < > = values in this interval not displayed.    Liver Function Tests: Recent Labs  Lab 11/26/18 1232 11/27/18 0530 12/01/18 0143 12/02/18 0500 12/03/18 0412  AST 37 47* 82* 44* 31  ALT 24 22 29  36 23  ALKPHOS 137* 123 133* 140* 134*  BILITOT 1.2 1.0 0.3 0.2* 0.5  PROT 6.6 6.1* 4.5* 4.5* 4.5*  ALBUMIN 2.6* 2.3* 1.5* 1.5* 1.7*   No results for input(s): LIPASE, AMYLASE in the last 168 hours. No results for input(s):  AMMONIA in the last 168 hours.  ABG    Component Value Date/Time   PHART 7.278 (L) 12/02/2018 1445   PCO2ART 47.3 12/02/2018 1445   PO2ART 486.0 (H) 12/02/2018 1445   HCO3 22.1 12/02/2018 1445   TCO2 24 12/02/2018 1445   ACIDBASEDEF 5.0 (H) 12/02/2018 1445   O2SAT 100.0 12/02/2018 1445     Coagulation Profile: No results for input(s): INR, PROTIME in the last 168 hours.  Cardiac Enzymes: No results for input(s): CKTOTAL, CKMB, CKMBINDEX, TROPONINI in the last 168 hours.  HbA1C: No results found for: HGBA1C  CBG: Recent Labs  Lab 11/26/18 1358  GLUCAP 121*    Review of Systems:   Denies pain. Marginal appetite. 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   Surgical History    Past Surgical History:  Procedure Laterality Date  . ANAL FISSURECTOMY  1994   and Hemorrhoidectomy  . BOTOX INJECTION N/A 11/18/2013   Procedure: BOTOX INJECTION;  Surgeon: Hart Carwinora M Brodie, MD;  Location: [facility];  Service: Endoscopy;  Laterality: N/A;  . CHOLECYSTECTOMY OPEN  1982  . CYSTOSCOPY  WITH RETROGRADE PYELOGRAM, URETEROSCOPY AND STENT PLACEMENT Left 06/06/2016   Procedure: CYSTOSCOPY WITH RETROGRADE PYELOGRAM, URETEROSCOPY AND STENT PLACEMENT;  Surgeon: Barron Alvineavid Grapey, MD;  [facility] [place];  Service: Urology;  Laterality: Left;  . FLEXIBLE SIGMOIDOSCOPY N/A 11/18/2013   Procedure: FLEXIBLE SIGMOIDOSCOPY/ with BOTOX;  Surgeon: Hart Carwinora M Brodie, MD;  Location: [facility];  Service: Endoscopy;  Laterality: N/A;  . HOLMIUM LASER APPLICATION Left 06/06/2016   Procedure: HOLMIUM LASER APPLICATION;  Surgeon: Barron Alvineavid Grapey, MD;  [facility] [place];  Service: Urology;  Laterality: Left;  . IRRIGATION AND DEBRIDEMENT BUTTOCKS N/A 12/02/2018   Procedure: IRRIGATION AND DEBRIDEMENT SACRAL DECUBITUS;  Surgeon: Karie SodaGross, Steven, MD;  Location: [facility];  Service: General;  Laterality: N/A;  . TONSILLECTOMY  age 73  . TOTAL ABDOMINAL HYSTERECTOMY W/ BILATERAL SALPINGOOPHORECTOMY  1984   and Appendectomy     Social History   reports that she has never smoked. She has never used smokeless tobacco. She reports that she does not drink alcohol or use drugs.   Family History   Her family history includes Breast cancer in her maternal aunt; Diabetes in her brother; Pancreatic cancer (age of onset: 2256) in her maternal uncle. There is no history of Colon cancer.   Allergies Allergies  Allergen Reactions  . Sinemet [Carbidopa W-Levodopa] Nausea And Vomiting    Able to tolerate with Zofran  . Sulfa Antibiotics Other (See Comments)    Flushing and hot     Home Medications  Prior to Admission medications   Medication Sig Start Date End Date Taking? Authorizing Provider  ALPRAZolam Prudy Feeler(XANAX) 0.5 MG tablet Take 1 tablet (0.5 mg total) by mouth 3 (three) times daily as needed for anxiety. 11/07/18  Yes Gwyneth SproutPlunkett, Whitney, MD  apixaban (ELIQUIS) 5 MG TABS tablet Take 10 mg by mouth 2 (two) times daily.   Yes [provider]  carbidopa-levodopa (SINEMET IR) 25-100 MG  tablet 1/2 tablet twice a day for 3 weeks, then take 1/2 tablet three times a day Patient taking differently: Take 0.5 tablets by mouth 3 (three) times daily.  09/18/18  Yes York Spaniel, MD  ciprofloxacin (CIPRO) 500 MG tablet Take 500 mg by mouth 2 (two) times daily.   Yes [provider]  lansoprazole (PREVACID) 30 MG capsule Take 30 mg by mouth every morning.    Yes [provider]  Multiple Vitamins-Minerals (ONE-A-DAY WOMENS 50 PLUS PO) Take 1 tablet by mouth daily.   Yes [provider]  Nutritional Supplements (RESOURCE 2.0) LIQD Take 120 mLs by mouth 2 (two) times daily.   Yes [provider]  NUTRITIONAL SUPPLEMENTS PO Take 1 Units by mouth daily at 12 noon. "Magic Cup."   Yes [provider]  ondansetron (ZOFRAN) 4 MG tablet Take 1 tablet (4 mg total) by mouth every 8 (eight) hours as needed for nausea or vomiting. Patient taking differently: Take 4 mg by mouth every 8 (eight) hours. Takes with carbidopa-levodopa 01/16/18  Yes Huston Foley, MD  traMADol (ULTRAM) 50 MG tablet Take 50 mg by mouth every 6 (six) hours as needed for moderate pain.    Yes [provider]  fluconazole (DIFLUCAN) 100 MG tablet Take 1 tablet (100 mg total) by mouth daily. Patient not taking: Reported on 11/26/2018 11/06/18   Darlin Drop, DO    The patient is critically ill with multiple organ systems failure and requires high complexity decision making for assessment and support, frequent evaluation and titration of therapies, application of advanced monitoring technologies and extensive interpretation of multiple databases.   Critical Care Time devoted to patient care services described in this note is  33  Minutes. This time reflects time of care of this signee Dr Koren Bound. This critical care time does not reflect procedure time, or teaching time or supervisory time of PA/NP/Med student/Med Resident etc but could involve care discussion time.  Alyson Reedy, M.D. [facility] Pulmonary/Critical Care Medicine. Pager: [phone]. After hours pager: [phone].

## 2018-12-03 NOTE — Progress Notes (Signed)
eLink Physician-Brief Progress Note Patient Name: Becky Figueroa DOB: 1946/07/09 MRN: [id number]   Date of Service  12/03/2018  HPI/Events of Note  Patient pulled out her arterial line, hemostasis secured, she is on pressors for septic shock and attempts to wean pressor earlier in the day resulted in hypotension.  eICU Interventions  Respiratory therapy requested to re-insert arterial line.        Thomasene Lot Tredarius Cobern 12/03/2018, 8:30 PM

## 2018-12-03 NOTE — Progress Notes (Signed)
[facility] Progress Note  1 Day Post-Op  Subjective: CC-  Comfortable this morning. Worried about dressing change causing pain.   Objective: Vital signs in last 24 hours: Temp:  [97.2 F (36.2 C)-97.9 F (36.6 C)] 97.5 F (36.4 C) (02/05 0800) Pulse Rate:  [74-90] 85 (02/05 0300) Resp:  [0-27] 20 (02/05 0300) BP: (101-139)/(55-83) 125/83 (02/04 1600) SpO2:  [92 %-100 %] 99 % (02/05 0300) Arterial Line BP: (120-149)/(51-63) 120/55 (02/04 1625) Weight:  [59 kg] 59 kg (02/04 1212) Last BM Date: 12/02/18  Intake/Output from previous day: 02/04 0701 - 02/05 0700 In: 2355.4 [P.O.:440; I.V.:1369.2; IV Piggyback:486.2] Out: 650 [Urine:650] Intake/Output this shift: No intake/output data recorded.  PE: Gen:  Alert, NAD, pleasant HEENT: EOM's intact, pupils equal and round Pulm:  effort normal Abd: Soft, NT/ND Skin: warm and dry GU: sacral wound dressing saturated with bright red blood, dressing changed and wound repacked with saline dampened kerlex  Lab Results:  Recent Labs    12/02/18 0500 12/02/18 1445 12/03/18 0412  WBC 4.8  --  8.0  HGB 9.8* 9.5* 9.5*  HCT 30.8* 28.0* 30.0*  PLT 162  --  174   BMET Recent Labs    12/02/18 0500 12/02/18 1445 12/03/18 0412  NA 139 140 140  K 3.1* 3.9 3.6  CL 114*  --  114*  CO2 21*  --  23  GLUCOSE 139*  --  138*  BUN 8  --  9  CREATININE 0.46  --  0.50  CALCIUM 6.9*  --  7.3*   PT/INR No results for input(s): LABPROT, INR in the last 72 hours. CMP     Component Value Date/Time   NA 140 12/03/2018 0412   K 3.6 12/03/2018 0412   CL 114 (H) 12/03/2018 0412   CO2 23 12/03/2018 0412   GLUCOSE 138 (H) 12/03/2018 0412   BUN 9 12/03/2018 0412   CREATININE 0.50 12/03/2018 0412   CALCIUM 7.3 (L) 12/03/2018 0412   PROT 4.5 (L) 12/03/2018 0412   ALBUMIN 1.7 (L) 12/03/2018 0412   AST 31 12/03/2018 0412   ALT 23 12/03/2018 0412   ALKPHOS 134 (H) 12/03/2018 0412   BILITOT 0.5 12/03/2018 0412   GFRNONAA >60  12/03/2018 0412   GFRAA >60 12/03/2018 0412   Lipase  No results found for: LIPASE     Studies/Results: Dg Chest Port 1 View  Result Date: 12/03/2018 CLINICAL DATA:  Respiratory failure EXAM: PORTABLE CHEST 1 VIEW COMPARISON:  November 28, 2018 FINDINGS: Central catheter tip is in the right atrium. No pneumothorax. There is a small right pleural effusion with atelectasis and suspected mild consolidation in the right base. There is patchy infiltrate in the left base as well. Subtle airspace opacity in the right upper lobe may represent a small focus of pneumonia or alveolar edema. Note that there is cardiomegaly with pulmonary venous hypertension and mild interstitial pulmonary edema. There is aortic atherosclerosis. No bone lesions. IMPRESSION: Pulmonary vascular congestion with areas of interstitial pulmonary edema. Question alveolar edema right upper lobe versus pneumonia. Small right pleural effusion with right base atelectasis. Question edema versus patchy pneumonia in the lung bases currently. There is aortic atherosclerosis. Central catheter tip in right atrium without pneumothorax Electronically Signed   By: Bretta Bang III M.D.   On: 12/03/2018 07:47    Anti-infectives: Anti-infectives (From admission, onward)   Start     Dose/Rate Route Frequency Ordered Stop   11/30/18 1200  piperacillin-tazobactam (ZOSYN) IVPB 3.375 g  3.375 g 12.5 mL/hr over 240 Minutes Intravenous Every 8 hours 11/30/18 1133     11/29/18 1200  vancomycin (VANCOCIN) IVPB 1000 mg/200 mL premix  Status:  Discontinued     1,000 mg 200 mL/hr over 60 Minutes Intravenous Every 24 hours 11/28/18 1304 11/29/18 0913   11/28/18 1800  ceFEPIme (MAXIPIME) 2 g in sodium chloride 0.9 % 100 mL IVPB  Status:  Discontinued     2 g 200 mL/hr over 30 Minutes Intravenous Every 12 hours 11/28/18 1304 11/30/18 1117   11/28/18 1200  vancomycin (VANCOCIN) 1,250 mg in sodium chloride 0.9 % 250 mL IVPB  Status:  Discontinued      1,250 mg 166.7 mL/hr over 90 Minutes Intravenous Every 48 hours 11/26/18 1715 11/28/18 1304   11/27/18 0600  ceFEPIme (MAXIPIME) 1 g in sodium chloride 0.9 % 100 mL IVPB  Status:  Discontinued     1 g 200 mL/hr over 30 Minutes Intravenous Every 12 hours 11/26/18 1715 11/28/18 1304   11/26/18 1430  ceFEPIme (MAXIPIME) 2 g in sodium chloride 0.9 % 100 mL IVPB     2 g 200 mL/hr over 30 Minutes Intravenous STAT 11/26/18 1429 11/26/18 1604   11/26/18 1430  vancomycin (VANCOCIN) IVPB 1000 mg/200 mL premix     1,000 mg 200 mL/hr over 60 Minutes Intravenous STAT 11/26/18 1429 11/26/18 1621       Assessment/Plan Parkinson's disease Dementia Anxiety/depression Malnutrition H/o RLE DVT - hold eliquis Septic shock/encephalopathy  Sacral decubitus ulcerwith osteomyelitis S/p EXCISIONAL DEBRIDEMENT SACRAL DECUBITUS 2/4 Dr. Michaell Cowing - POD 1 - surgical path pending - Hold IV heparin for 24 hours due to bleeding. Reinforce dressing if it becomes saturated again, do not take packing out. Can apply ice packs to wound as well.  ID -maxipime/vancomycin 1/29>>2/2, zosyn 2/2>> VTE -SCDs,hold IV heparin for bleeding FEN - Dysphagia 1 diet Foley - in place   LOS: 7 days    [name] , [facility] 12/03/2018, 8:38 AM Pager: [phone] Mon-Thurs 7:00 am-4:30 pm Fri 7:00 am -11:30 AM Sat-Sun 7:00 am-11:30 am

## 2018-12-03 NOTE — Procedures (Signed)
Arterial Catheter Insertion Procedure Note Becky Figueroa [id number] 04/17/1946  Procedure: Insertion of Arterial Catheter  Indications: Blood pressure monitoring  Procedure Details Consent: Risks of procedure as well as the alternatives and risks of each were explained to the (patient/caregiver).  Consent for procedure obtained. Time Out: Verified patient identification, verified procedure, site/side was marked, verified correct patient position, special equipment/implants available, medications/allergies/relevent history reviewed, required imaging and test results available.  Performed  Maximum sterile technique was used including antiseptics, cap, gloves, gown, hand hygiene and mask. Skin prep: Chlorhexidine; local anesthetic administered 20 gauge catheter was inserted into left radial artery using the Seldinger technique. ULTRASOUND GUIDANCE USED: NO Evaluation Blood flow good; BP tracing good. Complications: No apparent complications.   Becky Figueroa P 12/03/2018
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
## 2018-12-03 NOTE — Progress Notes (Signed)
PT Cancellation Note  Patient Details Name: Becky Figueroa MRN: [id number] DOB: Sep 14, 1946   Cancelled Treatment:    Reason Eval/Treat Not Completed: Other (comment)post surgical debridement 2/4. No hydro today. Dr. Michaell Cowing to advise  tomorrow.   Blanchard Kelch PT Acute Rehabilitation Services Pager [phone] Office [phone]  Rada Hay 12/03/2018, 8:46 AM

## 2018-12-03 NOTE — Progress Notes (Signed)
Patient ID: Becky Figueroa, female   DOB: 25-Jan-1946, 73 y.o.   MRN: [id number]         [facility] [place] for Infectious Disease  Date of Admission:  11/26/2018   Total days of antibiotics 8        Day 4 piperacillin tazobactam         ASSESSMENT: She underwent surgical debridement of her large sacral wound yesterday.  Postoperative pictures of the wound to look much better.  Bone was biopsied but no specimens made their way to the microbiology lab for stain or culture.  I have emphasized to her and her son that care of the infection and healing of her wound will require a long-term, multipronged strategy of improved nutrition, wound care, offloading of pressure and at least 6 weeks of antibiotic therapy.  She continues to have intermittent hypotension.  Repeat blood and urine cultures were obtained today.  I will continue piperacillin tazobactam for now  PLAN: 1. Continue current antibiotics 2. 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 carbidopa-levodopa  0.5 tablet Oral TID WC  . Chlorhexidine Gluconate Cloth  6 each Topical Daily  . feeding supplement (ENSURE ENLIVE)  237 mL Oral BID BM  . furosemide  20 mg Intravenous Q6H  . hydrocortisone sodium succinate  50 mg Intravenous Q6H  . mouth rinse  15 mL Mouth Rinse BID  . mouth rinse  15 mL Mouth Rinse BID  . potassium chloride  40 mEq Oral TID  . sodium chloride flush  10-40 mL Intracatheter Q12H   Continuous Infusions: . sodium chloride Stopped (11/29/18 1737)  . albumin human 60 mL/hr at 12/03/18 1045  . phenylephrine (NEO-SYNEPHRINE) Adult infusion 25 mcg/min (12/03/18 1045)  . piperacillin-tazobactam (ZOSYN)  IV 3.375 g  (12/03/18 1318)   PRN Meds:.acetaminophen **OR** acetaminophen, ALPRAZolam, fentaNYL (SUBLIMAZE) injection, ondansetron **OR** ondansetron (ZOFRAN) IV, oxyCODONE-acetaminophen, silver nitrate applicators, sodium chloride flush, traMADol   SUBJECTIVE: He says that she is feeling better.  She is glad that she slept well last night.  Review of Systems: Review of Systems  Constitutional: Negative for chills, diaphoresis and fever.    Allergies  Allergen Reactions  . Sinemet [Carbidopa W-Levodopa] Nausea And Vomiting    Able to tolerate with Zofran  . Sulfa Antibiotics Other (See Comments)    Flushing and hot    OBJECTIVE: Vitals:   12/03/18 0417 12/03/18 0800 12/03/18 0805 12/03/18 1200  BP:      Pulse:   91   Resp:   (!) 22   Temp: (!) 97.5 F (36.4 C) (!) 97.5 F (36.4 C)  97.7 F (36.5 C)  TempSrc: Oral Axillary  Oral  SpO2:   99%   Weight:      Height:       Body mass index is 20.99 kg/m.  Physical Exam Constitutional:      Comments: She is resting quietly in bed.  Her son is visiting.     Lab Results Lab Results  Component Value Date   WBC 8.0 12/03/2018   HGB 9.5 (L) 12/03/2018   HCT 30.0 (L) 12/03/2018   MCV 95.2 12/03/2018   PLT 174 12/03/2018    Lab  Results  Component Value Date   CREATININE 0.50 12/03/2018   BUN 9 12/03/2018   NA 140 12/03/2018   K 3.6 12/03/2018   CL 114 (H) 12/03/2018   CO2 23 12/03/2018    Lab Results  Component Value Date   ALT 23 12/03/2018   AST 31 12/03/2018   ALKPHOS 134 (H) 12/03/2018   BILITOT 0.5 12/03/2018     Microbiology: Recent Results (from the past 240 hour(s))  Culture, blood (routine x 2)     Status: None   Collection Time: 11/26/18  3:02 PM  Result Value Ref Range Status   Specimen Description   Final    BLOOD RIGHT ANTECUBITAL Performed at [facility], [address] [place], [place] [address]    Special Requests   Final    BOTTLES DRAWN AEROBIC AND ANAEROBIC Blood Culture  adequate volume Performed at [facility], [address] [place], [place] [address]    Culture   Final    NO GROWTH 5 DAYS Performed at [facility], [address] [place], [place] [address]    Report Status 12/01/2018 FINAL  Final  Culture, blood (routine x 2)     Status: None   Collection Time: 11/26/18  3:02 PM  Result Value Ref Range Status   Specimen Description   Final    BLOOD LEFT ANTECUBITAL Performed at [facility], [address] [place], [place] [address]    Special Requests   Final    BOTTLES DRAWN AEROBIC AND ANAEROBIC Blood Culture adequate volume Performed at [facility], [address] [place], [place] [address]    Culture   Final    NO GROWTH 5 DAYS Performed at [facility], [address] [place], [place] [address]    Report Status 12/01/2018 FINAL  Final  MRSA PCR Screening     Status: None   Collection Time: 11/27/18  2:43 PM  Result Value Ref Range Status   MRSA by PCR NEGATIVE NEGATIVE Final    Comment:        The GeneXpert MRSA Assay (FDA approved for NASAL specimens only), is one component of a comprehensive MRSA colonization surveillance program. It is not intended to diagnose MRSA infection nor to guide or monitor treatment for MRSA infections. Performed at [facility], [address] [place], [place] [address]   Culture, blood (routine x 2)     Status: None (Preliminary result)   Collection Time: 12/02/18  9:45 AM  Result Value Ref Range Status   Specimen Description   Final    BLOOD LEFT HAND Performed at [facility], [address] [place], [place] [address]    Special Requests   Final    BOTTLES DRAWN AEROBIC ONLY Blood Culture results may not be optimal due to an inadequate volume of blood received in culture bottles Performed at [facility], [address] [place], [place] [address]    Culture    Final    NO GROWTH 1 DAY Performed at [facility], [address] [place], [place] [address]    Report Status PENDING  Incomplete  Culture, blood (routine x 2)     Status: None (Preliminary result)   Collection Time: 12/02/18  9:53 AM  Result Value Ref Range Status   Specimen Description   Final    BLOOD LEFT HAND Performed at [facility], [address], [place], [place] [address]    Special Requests   Final    BOTTLES DRAWN AEROBIC ONLY Blood Culture results may not be optimal due to an inadequate volume of blood received in culture bottles Performed at [facility], [address], [place], [place] [address]    Culture   Final    NO GROWTH 1 DAY Performed at [facility], [address], [place], [place] [address]    Report Status PENDING  Incomplete    Cliffton Asters, MD [facility] for Infectious Disease [facility] [phone] pager   [phone] cell 12/03/2018, 4:39 PM

## 2018-12-04 DIAGNOSIS — G9340 Encephalopathy, unspecified: Secondary | ICD-10-CM

## 2018-12-04 DIAGNOSIS — D62 Acute posthemorrhagic anemia: Secondary | ICD-10-CM

## 2018-12-04 DIAGNOSIS — E440 Moderate protein-calorie malnutrition: Secondary | ICD-10-CM

## 2018-12-04 LAB — BLOOD CULTURE ID PANEL (REFLEXED)

## 2018-12-04 LAB — PROCALCITONIN

## 2018-12-04 LAB — COMPREHENSIVE METABOLIC PANEL
ALBUMIN: 3 g/dL — AB (ref 3.5–5.0)
ALT: 20 U/L (ref 0–44)
AST: 24 U/L (ref 15–41)
Alkaline Phosphatase: 102 U/L (ref 38–126)
Anion gap: 5 (ref 5–15)
BILIRUBIN TOTAL: 0.4 mg/dL (ref 0.3–1.2)
BUN: 8 mg/dL (ref 8–23)
CO2: 28 mmol/L (ref 22–32)
Calcium: 7.7 mg/dL — ABNORMAL LOW (ref 8.9–10.3)
Chloride: 110 mmol/L (ref 98–111)
Creatinine, Ser: 0.45 mg/dL (ref 0.44–1.00)
GFR calc Af Amer: >60 mL/min (ref >60)
Glucose, Bld: 134 mg/dL — ABNORMAL HIGH (ref 70–99)
Potassium: 2.9 mmol/L — ABNORMAL LOW (ref 3.5–5.1)
Sodium: 143 mmol/L (ref 135–145)
Total Protein: 5 g/dL — ABNORMAL LOW (ref 6.5–8.1)

## 2018-12-04 LAB — CBC
HEMATOCRIT: 22.9 % — AB (ref 36.0–46.0)
Hemoglobin: 7.3 g/dL — ABNORMAL LOW (ref 12.0–15.0)
MCH: 31.1 pg (ref 26.0–34.0)
MCHC: 31.9 g/dL (ref 30.0–36.0)
MCV: 97.4 fL (ref 80.0–100.0)
Platelets: 136 10*3/uL — ABNORMAL LOW (ref 150–400)
RBC: 2.35 MIL/uL — ABNORMAL LOW (ref 3.87–5.11)
RDW: 14.8 % (ref 11.5–15.5)
WBC: 6.6 10*3/uL (ref 4.0–10.5)
nRBC: 0 % (ref 0.0–0.2)

## 2018-12-04 LAB — MAGNESIUM: Magnesium: 1.5 mg/dL — ABNORMAL LOW (ref 1.7–2.4)

## 2018-12-04 LAB — PREPARE RBC (CROSSMATCH)

## 2018-12-04 LAB — PHOSPHORUS: Phosphorus: 2 mg/dL — ABNORMAL LOW (ref 2.5–4.6)

## 2018-12-04 LAB — ABO/RH: ABO/RH(D): AB POS

## 2018-12-04 LAB — URINE CULTURE: Culture: NO GROWTH

## 2018-12-04 LAB — HEMOGLOBIN AND HEMATOCRIT, BLOOD
HCT: 29.9 % — ABNORMAL LOW (ref 36.0–46.0)
Hemoglobin: 9.4 g/dL — ABNORMAL LOW (ref 12.0–15.0)

## 2018-12-04 LAB — HEPARIN LEVEL (UNFRACTIONATED): Heparin Unfractionated: 0.35 IU/mL (ref 0.30–0.70)

## 2018-12-04 MED ORDER — HEPARIN (PORCINE) 25000 UT/250ML-% IV SOLN
950.0000 [IU]/h | INTRAVENOUS | Status: DC
  Administered 2018-12-04 – 2018-12-06 (×3): 1250 [IU]/h via INTRAVENOUS
  Administered 2018-12-07: 1100 [IU]/h via INTRAVENOUS
  Filled 2018-12-04 (×4): qty 250

## 2018-12-04 MED ORDER — POTASSIUM CHLORIDE CRYS ER 20 MEQ PO TBCR
40.0000 meq | EXTENDED_RELEASE_TABLET | Freq: Three times a day (TID) | ORAL | Status: AC
  Administered 2018-12-04 (×2): 40 meq via ORAL
  Filled 2018-12-04 (×2): qty 2

## 2018-12-04 MED ORDER — SODIUM CHLORIDE 0.9% IV SOLUTION
Freq: Once | INTRAVENOUS | Status: AC
  Administered 2018-12-04: 09:00:00 via INTRAVENOUS

## 2018-12-04 MED ORDER — MAGNESIUM SULFATE 2 GM/50ML IV SOLN
2.0000 g | Freq: Once | INTRAVENOUS | Status: AC
  Administered 2018-12-04: 2 g via INTRAVENOUS
  Filled 2018-12-04: qty 50
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
## 2018-12-04 NOTE — Progress Notes (Signed)
[facility] Progress Note  2 Days Post-Op  Subjective: CC-  Comfortable this morning. [name] currently denies any pain. Hemoglobin 7.3 today from 9.5, getting transfused.  Objective: Vital signs in last 24 hours: Temp:  [97.4 F (36.3 C)-97.7 F (36.5 C)] 97.5 F (36.4 C) (02/06 0313) Pulse Rate:  [85-86] 86 (02/05 1600) Resp:  [19-20] 20 (02/05 1600) SpO2:  [99 %] 99 % (02/05 1600) Arterial Line BP: (124)/(58) 124/58 (02/05 0849) Last BM Date: 12/02/18  Intake/Output from previous day: 02/05 0701 - 02/06 0700 In: 970.8 [I.V.:704.6; IV Piggyback:266.1] Out: 2860 [Urine:2860] Intake/Output this shift: No intake/output data recorded.  PE: Gen:  Alert, NAD, pleasant HEENT: EOM's intact, pupils equal and round Pulm:  effort normal Abd: Soft, NT/ND Skin: warm and dry GU: wound with no active bleeding at this time     Lab Results:  Recent Labs    12/03/18 0412 12/04/18 0358  WBC 8.0 6.6  HGB 9.5* 7.3*  HCT 30.0* 22.9*  PLT 174 136*   BMET Recent Labs    12/03/18 0412 12/04/18 0358  NA 140 143  K 3.6 2.9*  CL 114* 110  CO2 23 28  GLUCOSE 138* 134*  BUN 9 8  CREATININE 0.50 0.45  CALCIUM 7.3* 7.7*   PT/INR No results for input(s): LABPROT, INR in the last 72 hours. CMP     Component Value Date/Time   NA 143 12/04/2018 0358   K 2.9 (L) 12/04/2018 0358   CL 110 12/04/2018 0358   CO2 28 12/04/2018 0358   GLUCOSE 134 (H) 12/04/2018 0358   BUN 8 12/04/2018 0358   CREATININE 0.45 12/04/2018 0358   CALCIUM 7.7 (L) 12/04/2018 0358   PROT 5.0 (L) 12/04/2018 0358   ALBUMIN 3.0 (L) 12/04/2018 0358   AST 24 12/04/2018 0358   ALT 20 12/04/2018 0358   ALKPHOS 102 12/04/2018 0358   BILITOT 0.4 12/04/2018 0358   GFRNONAA >60 12/04/2018 0358   GFRAA >60 12/04/2018 0358   Lipase  No results found for: LIPASE     Studies/Results: Dg Chest Port 1 View  Result Date: 12/03/2018 CLINICAL DATA:  Respiratory failure EXAM: PORTABLE CHEST  1 VIEW COMPARISON:  November 28, 2018 FINDINGS: Central catheter tip is in the right atrium. No pneumothorax. There is a small right pleural effusion with atelectasis and suspected mild consolidation in the right base. There is patchy infiltrate in the left base as well. Subtle airspace opacity in the right upper lobe may represent a small focus of pneumonia or alveolar edema. Note that there is cardiomegaly with pulmonary venous hypertension and mild interstitial pulmonary edema. There is aortic atherosclerosis. No bone lesions. IMPRESSION: Pulmonary vascular congestion with areas of interstitial pulmonary edema. Question alveolar edema right upper lobe versus pneumonia. Small right pleural effusion with right base atelectasis. Question edema versus patchy pneumonia in the lung bases currently. There is aortic atherosclerosis. Central catheter tip in right atrium without pneumothorax Electronically Signed   By: Bretta BangWilliam  Woodruff III M.D.   On: 12/03/2018 07:47    Anti-infectives: Anti-infectives (From admission, onward)   Start     Dose/Rate Route Frequency Ordered Stop   11/30/18 1200  piperacillin-tazobactam (ZOSYN) IVPB 3.375 g     3.375 g 12.5 mL/hr over 240 Minutes Intravenous Every 8 hours 11/30/18 1133     11/29/18 1200  vancomycin (VANCOCIN) IVPB 1000 mg/200 mL premix  Status:  Discontinued     1,000 mg 200 mL/hr over 60 Minutes Intravenous Every 24  hours 11/28/18 1304 11/29/18 0913   11/28/18 1800  ceFEPIme (MAXIPIME) 2 g in sodium chloride 0.9 % 100 mL IVPB  Status:  Discontinued     2 g 200 mL/hr over 30 Minutes Intravenous Every 12 hours 11/28/18 1304 11/30/18 1117   11/28/18 1200  vancomycin (VANCOCIN) 1,250 mg in sodium chloride 0.9 % 250 mL IVPB  Status:  Discontinued     1,250 mg 166.7 mL/hr over 90 Minutes Intravenous Every 48 hours 11/26/18 1715 11/28/18 1304   11/27/18 0600  ceFEPIme (MAXIPIME) 1 g in sodium chloride 0.9 % 100 mL IVPB  Status:  Discontinued     1 g 200 mL/hr  over 30 Minutes Intravenous Every 12 hours 11/26/18 1715 11/28/18 1304   11/26/18 1430  ceFEPIme (MAXIPIME) 2 g in sodium chloride 0.9 % 100 mL IVPB     2 g 200 mL/hr over 30 Minutes Intravenous STAT 11/26/18 1429 11/26/18 1604   11/26/18 1430  vancomycin (VANCOCIN) IVPB 1000 mg/200 mL premix     1,000 mg 200 mL/hr over 60 Minutes Intravenous STAT 11/26/18 1429 11/26/18 1621       Assessment/Plan Parkinson's disease Dementia Anxiety/depression Malnutrition H/o RLE DVT - hold eliquis Septic shock/encephalopathy  Sacral decubitus ulcerwith osteomyelitis S/p EXCISIONALDEBRIDEMENT SACRAL DECUBITUS 2/4 Dr. Michaell Cowing - POD 2 - surgical path pending - daily wet to dry dressing changes  ID -maxipime/vancomycin 1/29>>2/2, zosyn 2/2>> VTE -SCDs, restart IV heparin this AM FEN - Dysphagia 1 diet Foley - in place  Plan - Wound with no active bleeding this morning, ok to restart IV heparin. Continue daily wet to dry dressing changes. Transfusion per primary.   LOS: 8 days    Becky Figueroa , [facility] 12/04/2018, 8:22 AM Pager: [phone] Mon-Thurs 7:00 am-4:30 pm Fri 7:00 am -11:30 AM Sat-Sun 7:00 am-11:30 am

## 2018-12-04 NOTE — Progress Notes (Addendum)
PHARMACY - PHYSICIAN COMMUNICATION CRITICAL VALUE ALERT - BLOOD CULTURE IDENTIFICATION (BCID)  Becky Figueroa is an 73 y.o. female who presented to [facility] on 11/26/2018 with a chief complaint of infected decubitus ulcer. ID following for sacral osteo, narrowed abx to Zosyn.  Assessment:  1/2 BCx (1/2 bottles) with MR-CoNS (suspect contam)  Name of physician (or Provider) ContactedVeleta Miners, PCCM  Current antibiotics: Zosyn  Changes to prescribed antibiotics recommended:  No changes to current abx; suspect contamination and would have worsened earlier if true MR-CoNS and vanc stopped 5d ago  Results for orders placed or performed during the hospital encounter of 11/26/18  Blood Culture ID Panel (Reflexed) (Collected: 12/02/2018  9:53 AM)  Result Value Ref Range   Enterococcus species NOT DETECTED NOT DETECTED   Listeria monocytogenes NOT DETECTED NOT DETECTED   Staphylococcus species DETECTED (A) NOT DETECTED   Staphylococcus aureus (BCID) NOT DETECTED NOT DETECTED   Methicillin resistance DETECTED (A) NOT DETECTED   Streptococcus species NOT DETECTED NOT DETECTED   Streptococcus agalactiae NOT DETECTED NOT DETECTED   Streptococcus pneumoniae NOT DETECTED NOT DETECTED   Streptococcus pyogenes NOT DETECTED NOT DETECTED   Acinetobacter baumannii NOT DETECTED NOT DETECTED   Enterobacteriaceae species NOT DETECTED NOT DETECTED   Enterobacter cloacae complex NOT DETECTED NOT DETECTED   Escherichia coli NOT DETECTED NOT DETECTED   Klebsiella oxytoca NOT DETECTED NOT DETECTED   Klebsiella pneumoniae NOT DETECTED NOT DETECTED   Proteus species NOT DETECTED NOT DETECTED   Serratia marcescens NOT DETECTED NOT DETECTED   Haemophilus influenzae NOT DETECTED NOT DETECTED   Neisseria meningitidis NOT DETECTED NOT DETECTED   Pseudomonas aeruginosa NOT DETECTED NOT DETECTED   Candida albicans NOT DETECTED NOT DETECTED   Candida glabrata NOT DETECTED NOT DETECTED   Candida krusei NOT  DETECTED NOT DETECTED   Candida parapsilosis NOT DETECTED NOT DETECTED   Candida tropicalis NOT DETECTED NOT DETECTED    Afreen Siebels A 12/04/2018  8:40 AM

## 2018-12-04 NOTE — Progress Notes (Signed)
Pharmacy: [facility]  Patient's a 73 y.o F with hx recent  DVT on heparin drip while home Eliquis is on hold. Heparin level now back therapeutic at  0.38 (goal 0.3-07)  Significant events: 1/29 Last apixaban dose taken PTA 1/30 Heparin held at 08:00 for debridement, resumed heparin s/p procedure 1/31-2/1 overnight, pt pulled out line while in MRI, heparin off ~ 1-2 hours 2/4 Heparin off at 02:00 prior to OR debridement.  AET 1533.  Heparin to resume 12 hrs postop per Dr. Michaell CowingGross, no bolus. 2/5 Heparin resumed 03:30.  Heparin held ~ 08:00 per RN for oozing/bleeding from wound.  Per CCS, Hold anticoagulation for 24 hours 2/6: Hgb lower today from recent bleeding but no current bleeding per RN and CCS; Patient received 1 unit PRBC today;  Ok to resume heparin  Plan: - continue heparin drip at 1250 units/hr - f/u with AM labs - monitor for s/s bleeding  Dorna LeitzAnh Kyleigh Nannini, PharmD, BCPS 12/04/2018 7:02 PM

## 2018-12-04 NOTE — Progress Notes (Signed)
Nutrition Follow-up  DOCUMENTATION CODES:   Non-severe (moderate) malnutrition in context of acute illness/injury  INTERVENTION:  - Continue Ensure Enlive BID. - Continue to encourage PO intakes.    NUTRITION DIAGNOSIS:   Moderate Malnutrition related to acute illness as evidenced by mild fat depletion, mild muscle depletion. -revised  GOAL:   Patient will meet greater than or equal to 90% of their needs -met on average  MONITOR:   PO intake, Supplement acceptance, Weight trends, Labs, Skin, I & O's  ASSESSMENT:   73 y.o. female with past medical history significant for dementia with Parkinson's disease, depression, and decubitus ulcer. Son also reports recent dx of RLE DVT and that patient was started on anticoagulation. She was referred to the ED from the wound care center d/t worsening of her wound. Patient was noted to be very lethargic and confused in the ED.  Weight stable 1/29-2/4 and no new weight since that time. No intakes documented throughout admission. Spoke with son at bedside on 1/31 and again this AM. Patient denies any abdominal pain or nausea at the time of visit and accepted ~25% of an Ensure during RD visit. Son provided all information.  He stated that patient was holding in urine at the facility and that he feels this may have contributed to poor intakes while she was there. He states that since admission patient had mainly been consuming 100% of breakfast, 75% of lunch, and 50% of dinner meals, on average. He states that this pattern has been patient's usual for a prolonged period. Based on this and review of meals from 2/4, patient consumed 1101 kcal and 33 grams of protein from meals 2/4.  NFPE done this visit and outlined below. Documentation updated.    Medications reviewed; 25 g albumin x4 doses 2/5, 20 mg IV lasix x3 doses 2/5, 50 mg solu-cortef QID, 2 g IV Mg sulfate x1 run 2/6, 40 mEq K-Dur x2 doses 2/6. Labs reviewed; K: 2.9 mmol/l, Ca: 7.7 mg/dl,  Mg: 1.5 mg/dl.     NUTRITION - FOCUSED PHYSICAL EXAM:    Most Recent Value  Orbital Region  Mild depletion  Upper Arm Region  Mild depletion  Thoracic and Lumbar Region  Unable to assess  Buccal Region  Mild depletion  Temple Region  Mild depletion  Clavicle Bone Region  Mild depletion  Clavicle and Acromion Bone Region  Mild depletion  Scapular Bone Region  Unable to assess  Dorsal Hand  Mild depletion  Patellar Region  Unable to assess  Anterior Thigh Region  Unable to assess  Posterior Calf Region  Unable to assess  Edema (RD Assessment)  Unable to assess  Hair  Reviewed  Eyes  Reviewed  Mouth  Reviewed  Skin  Reviewed  Nails  Reviewed       Diet Order:   Diet Order            DIET - DYS 1 Room service appropriate? Yes; Fluid consistency: Thin  Diet effective now              EDUCATION NEEDS:   No education needs have been identified at this time  Skin:  Skin Assessment: Skin Integrity Issues: Skin Integrity Issues:: DTI, Unstageable DTI: sacrum Unstageable: full thickness to sacrum  Last BM:  1/31  Height:   Ht Readings from Last 1 Encounters:  12/02/18 5' 6" (1.676 m)    Weight:   Wt Readings from Last 1 Encounters:  12/02/18 59 kg    Ideal Body   Weight:  59.09 kg  BMI:  Body mass index is 20.99 kg/m.  Estimated Nutritional Needs:   Kcal:  1900-2100  Protein:  90-105 grams  Fluid:  >/= 2 L/day      , MS, RD, LDN, CNSC Inpatient Clinical Dietitian Pager # [phone] After hours/weekend pager # [phone]  

## 2018-12-04 NOTE — Progress Notes (Signed)
ANTICOAGULATION CONSULT NOTE - Follow Up Consult  Pharmacy Consult for Heparin Indication: DVT (PTA apixaban on hold)  Allergies  Allergen Reactions  . Sinemet [Carbidopa W-Levodopa] Nausea And Vomiting    Able to tolerate with Zofran  . Sulfa Antibiotics Other (See Comments)    Flushing and hot    Patient Measurements: Height: 5\' 6"  (167.6 cm) Weight: 130 lb 1.1 oz (59 kg) IBW/kg (Calculated) : 59.3 Heparin Dosing Weight: TBW  Vital Signs: Temp: 97.6 F (36.4 C) (02/06 0800) Temp Source: Axillary (02/06 0800)  Labs: Recent Labs    12/01/18 1102  12/02/18 0500 12/02/18 0528 12/02/18 1445 12/03/18 0412 12/04/18 0358  HGB  --    < > 9.8*  --  9.5* 9.5* 7.3*  HCT  --    < > 30.8*  --  28.0* 30.0* 22.9*  PLT  --   --  162  --   --  174 136*  HEPARINUNFRC 0.34  --   --  <0.10*  --   --   --   CREATININE  --   --  0.46  --   --  0.50 0.45   < > = values in this interval not displayed.    Estimated Creatinine Clearance: 59.2 mL/min (by C-G formula based on SCr of 0.45 mg/dL).   Medications:  Infusions:  . sodium chloride Stopped (11/29/18 1737)  . sodium chloride    . heparin    . magnesium sulfate 1 - 4 g bolus IVPB 2 g (12/04/18 0856)  . phenylephrine (NEO-SYNEPHRINE) Adult infusion 25 mcg/min (12/04/18 0600)  . piperacillin-tazobactam (ZOSYN)  IV Stopped (12/04/18 0222)    Assessment: 52 yoF admitted on 1/29 with osteomyelitis of sacral decubitus ulcer with surgical consult for debridement. Pt was recently diagnosed with RLE DVT and started on Eliquis. Pharmacy is consulted to dose Heparin IV while PTA Eliquis for DVT is on hold.   Significant events: 1/29 Last apixaban dose taken PTA 1/30 Heparin held at 08:00 for debridement, resumed heparin s/p procedure 1/31-2/1 overnight, pt pulled out line while in MRI, heparin off ~ 1-2 hours 2/4 Heparin off at 02:00 prior to OR debridement.  AET 1533.  Heparin to resume 12 hrs postop per Dr. Michaell Cowing, no bolus. 2/5  Heparin resumed 03:30.  Heparin held ~ 08:00 per RN for oozing/bleeding from wound.  Per CCS, Hold anticoagulation for 24 hours 2/6: Hgb lower today from recent bleeding but no current bleeding per RN and CCS; Ok to resume heparin  Today, 12/04/2018:  CBC:  Hgb lower d/t previous bleeding; transfusing 1 unit PRBC today  SCr significantly improved since admission  See CCS notes regarding wound bleeding.  No IV or line issues per RN.   Goal of Therapy:  Heparin level 0.3-0.7 units/ml Monitor platelets by anticoagulation protocol: Yes   Plan:  Resume heparin IV infusion at 1250 units/hr, NO bolus Check heparin level 8 hours after starting. Daily Heparin level and CBC  No current plans for further debridement  Bernadene Person, PharmD, BCPS [phone] 12/04/2018, 9:51 AM

## 2018-12-04 NOTE — Progress Notes (Signed)
Patient ID: Becky Figueroa, female   DOB: 10-08-1946, 73 y.o.   MRN: [id number]          [facility]    Date of Admission:  11/26/2018   Total days of antibiotics 9        Day 5 piperacillin tazobactam  She is alert and appears comfortable resting quietly in bed.  Her son is at the bedside.  She remains afebrile but is also still hypotensive and on pressors.  Repeat blood cultures grew methicillin-resistant coag negative staph in 1 set.  This probably represents a contaminant.  Urine culture was negative.  I have no recent cultures from her large sacral wound and osteomyelitis.  A swab wound culture done at the wound center on 11/20/2018 showed gram-positive cocci and gram-negative rods on stain and grew E. coli and Bacteroides.  I will continue piperacillin tazobactam for now.  If she continues to be hypotensive I will consider a change in therapy tomorrow.         Cliffton Asters, MD [facility] for Infectious Disease [facility] [phone] pager   [phone] cell 12/04/2018, 10:20 AM

## 2018-12-04 NOTE — Progress Notes (Signed)
NAME:  Becky Figueroa, MRN:  [id number], DOB:  07-Aug-1946, LOS: 8 ADMISSION DATE:  11/26/2018, CONSULTATION DATE:  2/1 REFERRING MD:  Dr Ashley Royalty, CHIEF COMPLAINT:  Septic Shock   Brief History   72 year old woman with Parkinson's disease, history right LE DVT, admitted with septic shock due to large decubitus ulcer and osteomyelitis.  History of present illness   73 year old woman with Parkinson's disease and dementia, depression, right lower extremity DVT on anticoagulation.  She has been dealing with a large sacral decubitus ulcer, receiving wound care as an outpatient.  She was admitted with lethargy, toxic metabolic encephalopathy in the setting of apparent severe sepsis and osteomyelitis.  MRI of the spine and sacrum 1/31 confirm the osteomyelitis and surrounding myositis.  She had acute renal insufficiency and hyponatremia which have improved with volume resuscitation.  Started on broad-spectrum antibiotics, now on cefepime alone.  She is 13.2 L positive for the admission so far.  2/1 she has developed progressive hypotension.  She received low-dose Lasix earlier in the day (only 10 mg).   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   Significant Hospital Events     Consults:  General surgery Wound care Infectious diseases Cardiology   Procedures:    Significant Diagnostic Tests:  Head CT 1/29 >> no acute abnormality MRI sacrum 1/31 >> osteomyelitis of the lower sacrum and coccyx associated with large decubitus ulcer, diffuse gluteal edema and presacral edema compatible with myositis.  No pyomyositis seen Echocardiogram 2/1 >>  normal left ventricular size and function, EF 60 to 65%, no pericardial effusion, normal valvular function  Micro Data:  Blood 1/29 >> No growth  Antimicrobials:  Vancomycin 1/29 > 1/31 Cefepime 1/29 >>   Interim history/subjective:  Significant Hg drop overnight to 7 from 9 Continues to be on low dose neo  Objective   Blood pressure 125/83, pulse 86, temperature 97.6 F (36.4 C), temperature source Axillary, resp. rate 20, height 5\' 6"  (1.676 m), weight 59 kg, SpO2 99 %.        Intake/Output Summary (Last 24 hours) at 12/04/2018 0831 Last data filed at 12/04/2018 0600 Gross per 24 hour  Intake 1429.01 ml  Output 2860 ml  Net -1430.99 ml   Filed Weights   11/26/18 2033 11/27/18 1400 12/02/18 1212  Weight: 59 kg 59 kg 59 kg   Examination: General: Chronically ill appearing female, NAD, very weak HENT: [place]/AT, PERRL EOM-I and MMM Lungs: Bibasilar crackles Cardiovascular: RRR, Nl S1/S2 and -M/R/G Abdomen: Soft, NT, ND and +BS Extremities: 2+ edema and -tenderness Neuro: Awake and interactive, moving all ext to command Skin: Wound noted  I reviewed CXR myself, mild pulmonary edema noted  Resolved Hospital Problem list   Acute renal failure Hyponatremia Discussed with TRH-MD  Assessment & Plan:  Septic shock.  Source is sacral osteomyelitis with surrounding myositis due to decubitus ulcer. - KVO IVF - Change parameters on neo to SBP of 90 and nurse informed - Cortisol level 15.8 >> continue stress dose steroids - Continue zosyn for now - CCS following for wound debridement  - Culture as is clinically indicated - Trend WBC, fever - Will re-culture as continued hypotension  - Transfuse 1 unit pRBC  Positive I&O Balance +  13 L and -1.5 liters overnight  Plan - CXR in am - Lasix 20 mg IV q6 x3 doses - Transfuse - KVO IVF  Toxic metabolic encephalopathy due to sepsis and metabolic disarray - D/C all sedating medications - Monitor clinically - Frequent  re-orientation  History of right lower extremity DVT. - Has been treated with Eliquis, currently on hold to facilitate bedside debridement and heparin infusion off due to Hg drop  History of Parkinson's and dementia - On Sinemet   Relative anemia - Trend CBC  PCCM will continue to follow while pressors are needed.  Labs   CBC: Recent Labs  Lab 11/30/18 0334 12/01/18 0143 12/02/18 0500 12/02/18 1445 12/03/18 0412 12/04/18 0358  WBC 3.9* 5.3 4.8  --  8.0 6.6  HGB 9.1* 9.2* 9.8* 9.5* 9.5* 7.3*  HCT 28.8* 28.8* 30.8* 28.0* 30.0* 22.9*  MCV 98.0 95.4 94.8  --  95.2 97.4  PLT 122* 155 162  --  174 136*   Basic Metabolic Panel: Recent Labs  Lab 11/30/18 0334 12/01/18 0143 12/02/18 0500 12/02/18 1445 12/03/18 0412 12/04/18 0358  NA 136 137 139 140 140 143  K 2.5* 2.9* 3.1* 3.9 3.6 2.9*  CL 112* 114* 114*  --  114* 110  CO2 18* 19* 21*  --  23 28  GLUCOSE 94 98 139*  --  138* 134*  BUN 10 10 8   --  9 8  CREATININE 0.49 0.48 0.46  --  0.50 0.45  CALCIUM 6.7* 6.7* 6.9*  --  7.3* 7.7*  MG  --   --  1.6*  --  1.8 1.5*  PHOS  --   --  2.0*  --   --  2.0*   GFR: Estimated Creatinine Clearance: 59.2 mL/min (by C-G formula based on SCr of 0.45 mg/dL). Recent Labs  Lab 11/29/18 1722 11/30/18 0334 12/01/18 0143 12/02/18 0500 12/03/18 0412 12/04/18 0358  PROCALCITON 0.35 0.32 0.37  --  0.13 <0.10  WBC  --  3.9* 5.3 4.8 8.0 6.6  LATICACIDVEN 1.6  --   --   --   --   --     Liver Function Tests: Recent Labs  Lab 12/01/18 0143 12/02/18 0500 12/03/18 0412 12/04/18 0358  AST 82* 44* 31 24  ALT 29 36 23 20  ALKPHOS 133* 140* 134* 102  BILITOT 0.3 0.2* 0.5 0.4  PROT 4.5* 4.5* 4.5* 5.0*  ALBUMIN 1.5* 1.5* 1.7* 3.0*   No results for input(s): LIPASE, AMYLASE in the last 168 hours. No results for input(s): AMMONIA in the last 168 hours.  ABG    Component Value Date/Time   PHART 7.278 (L) 12/02/2018 1445   PCO2ART 47.3 12/02/2018 1445   PO2ART 486.0 (H)  12/02/2018 1445   HCO3 22.1 12/02/2018 1445   TCO2 24 12/02/2018 1445   ACIDBASEDEF 5.0 (H) 12/02/2018 1445   O2SAT 100.0 12/02/2018 1445     Coagulation Profile: No results for input(s): INR, PROTIME in the last 168 hours.  Cardiac Enzymes: No results for input(s): CKTOTAL, CKMB, CKMBINDEX, TROPONINI in the last 168 hours.  HbA1C: No results found for: HGBA1C  CBG: No results for input(s): GLUCAP in the last 168 hours.  Review of Systems:   Denies pain. Marginal appetite. Otherwise negative   Past Medical History  She,  has a past medical history of Anal stenosis, Anxiety, Decubitus ulcer, Dementia in Parkinson's disease (HCC) (09/18/2018), Depression, Fatty liver, GERD (gastroesophageal reflux disease), Hiatal hernia, History of anal fissures, History  of chronic constipation, History of kidney stones, Hyperlipidemia, Left ureteral calculus, Parkinson's disease (HCC), and Wears glasses.   Surgical History    Past Surgical History:  Procedure Laterality Date  . ANAL FISSURECTOMY  1994   and Hemorrhoidectomy  . BOTOX INJECTION N/A 11/18/2013   Procedure: BOTOX INJECTION;  Surgeon: Hart Carwin, MD;  Location: [facility];  Service: Endoscopy;  Laterality: N/A;  . CHOLECYSTECTOMY OPEN  1982  . CYSTOSCOPY WITH RETROGRADE PYELOGRAM, URETEROSCOPY AND STENT PLACEMENT Left 06/06/2016   Procedure: CYSTOSCOPY WITH RETROGRADE PYELOGRAM, URETEROSCOPY AND STENT PLACEMENT;  Surgeon: Barron Alvine, MD;  Location: [facility];  Service: Urology;  Laterality: Left;  . FLEXIBLE SIGMOIDOSCOPY N/A 11/18/2013   Procedure: FLEXIBLE SIGMOIDOSCOPY/ with BOTOX;  Surgeon: Hart Carwin, MD;  Location: [facility];  Service: Endoscopy;  Laterality: N/A;  . HOLMIUM LASER APPLICATION Left 06/06/2016   Procedure: HOLMIUM LASER APPLICATION;  Surgeon: Barron Alvine, MD;  Location: [facility];  Service: Urology;  Laterality: Left;  . IRRIGATION AND DEBRIDEMENT BUTTOCKS N/A 12/02/2018     Procedure: IRRIGATION AND DEBRIDEMENT SACRAL DECUBITUS;  Surgeon: Karie Soda, MD;  Location: [facility];  Service: General;  Laterality: N/A;  . TONSILLECTOMY  age 1  . TOTAL ABDOMINAL HYSTERECTOMY W/ BILATERAL SALPINGOOPHORECTOMY  1984   and Appendectomy     Social History   reports that she has never smoked. She has never used smokeless tobacco. She reports that she does not drink alcohol or use drugs.   Family History   Her family history includes Breast cancer in her maternal aunt; Diabetes in her brother; Pancreatic cancer (age of onset: 33) in her maternal uncle. There is no history of Colon cancer.   Allergies Allergies  Allergen Reactions  . Sinemet [Carbidopa W-Levodopa] Nausea And Vomiting    Able to tolerate with Zofran  . Sulfa Antibiotics Other (See Comments)    Flushing and hot     Home Medications  Prior to Admission medications   Medication Sig Start Date End Date Taking? Authorizing Provider  ALPRAZolam Prudy Feeler) 0.5 MG tablet Take 1 tablet (0.5 mg total) by mouth 3 (three) times daily as needed for anxiety. 11/07/18  Yes Gwyneth Sprout, MD  apixaban (ELIQUIS) 5 MG TABS tablet Take 10 mg by mouth 2 (two) times daily.   Yes [provider]  carbidopa-levodopa (SINEMET IR) 25-100 MG tablet 1/2 tablet twice a day for 3 weeks, then take 1/2 tablet three times a day Patient taking differently: Take 0.5 tablets by mouth 3 (three) times daily.  09/18/18  Yes York Spaniel, MD  ciprofloxacin (CIPRO) 500 MG tablet Take 500 mg by mouth 2 (two) times daily.   Yes [provider]  lansoprazole (PREVACID) 30 MG capsule Take 30 mg by mouth every morning.    Yes [provider]  Multiple Vitamins-Minerals (ONE-A-DAY WOMENS 50 PLUS PO) Take 1 tablet by mouth daily.   Yes [provider]  Nutritional Supplements (RESOURCE 2.0) LIQD Take 120 mLs by mouth 2 (two) times daily.   Yes [provider]  NUTRITIONAL SUPPLEMENTS PO Take 1  Units by mouth daily at 12 noon. "Magic Cup."   Yes [provider]  ondansetron (ZOFRAN) 4 MG tablet Take 1 tablet (4 mg total) by mouth every 8 (eight) hours as needed for nausea or vomiting. Patient taking differently: Take 4 mg by mouth every 8 (eight) hours. Takes with carbidopa-levodopa 01/16/18  Yes Huston Foley, MD  traMADol (ULTRAM) 50 MG tablet  Take 50 mg by mouth every 6 (six) hours as needed for moderate pain.    Yes [provider]  fluconazole (DIFLUCAN) 100 MG tablet Take 1 tablet (100 mg total) by mouth daily. Patient not taking: Reported on 11/26/2018 11/06/18   Darlin DropHall, Carole N, DO    The patient is critically ill with multiple organ systems failure and requires high complexity decision making for assessment and support, frequent evaluation and titration of therapies, application of advanced monitoring technologies and extensive interpretation of multiple databases.   Critical Care Time devoted to patient care services described in this note is  33  Minutes. This time reflects time of care of this signee Dr Koren BoundWesam Yacoub. This critical care time does not reflect procedure time, or teaching time or supervisory time of PA/NP/Med student/Med Resident etc but could involve care discussion time.  Alyson ReedyWesam G. Yacoub, M.D. [facility]. Pager: [phone]. After hours pager: [phone].

## 2018-12-05 ENCOUNTER — Inpatient Hospital Stay (HOSPITAL_COMMUNITY): Payer: Medicare Other

## 2018-12-05 DIAGNOSIS — R7881 Bacteremia: Secondary | ICD-10-CM

## 2018-12-05 DIAGNOSIS — B9562 Methicillin resistant Staphylococcus aureus infection as the cause of diseases classified elsewhere: Secondary | ICD-10-CM

## 2018-12-05 DIAGNOSIS — B957 Other staphylococcus as the cause of diseases classified elsewhere: Secondary | ICD-10-CM

## 2018-12-05 LAB — CBC
HCT: 28.3 % — ABNORMAL LOW (ref 36.0–46.0)
HEMOGLOBIN: 9.2 g/dL — AB (ref 12.0–15.0)
MCH: 31.5 pg (ref 26.0–34.0)
MCHC: 32.5 g/dL (ref 30.0–36.0)
MCV: 96.9 fL (ref 80.0–100.0)
Platelets: 119 10*3/uL — ABNORMAL LOW (ref 150–400)
RBC: 2.92 MIL/uL — AB (ref 3.87–5.11)
RDW: 15.4 % (ref 11.5–15.5)
WBC: 4.5 10*3/uL (ref 4.0–10.5)
nRBC: 0 % (ref 0.0–0.2)

## 2018-12-05 LAB — BASIC METABOLIC PANEL
Anion gap: 4 — ABNORMAL LOW (ref 5–15)
BUN: 10 mg/dL (ref 8–23)
CO2: 27 mmol/L (ref 22–32)
Calcium: 7.7 mg/dL — ABNORMAL LOW (ref 8.9–10.3)
Chloride: 110 mmol/L (ref 98–111)
Creatinine, Ser: 0.45 mg/dL (ref 0.44–1.00)
GFR calc Af Amer: >60 mL/min (ref >60)
GFR calc non Af Amer: >60 mL/min (ref >60)
Glucose, Bld: 154 mg/dL — ABNORMAL HIGH (ref 70–99)
POTASSIUM: 3.9 mmol/L (ref 3.5–5.1)
Sodium: 141 mmol/L (ref 135–145)

## 2018-12-05 LAB — TYPE AND SCREEN
ABO/RH(D): AB POS
Antibody Screen: NEGATIVE
Unit division: 0

## 2018-12-05 LAB — BPAM RBC
Blood Product Expiration Date: 202002262359
ISSUE DATE / TIME: 202002061149
UNIT TYPE AND RH: 6200

## 2018-12-05 LAB — PHOSPHORUS: Phosphorus: 1.4 mg/dL — ABNORMAL LOW (ref 2.5–4.6)

## 2018-12-05 LAB — HEPARIN LEVEL (UNFRACTIONATED)
Heparin Unfractionated: 0.64 IU/mL (ref 0.30–0.70)
Heparin Unfractionated: 0.59 IU/mL (ref 0.30–0.70)

## 2018-12-05 LAB — MAGNESIUM: Magnesium: 1.9 mg/dL (ref 1.7–2.4)

## 2018-12-05 MED ORDER — OXYCODONE-ACETAMINOPHEN 5-325 MG PO TABS
1.0000 | ORAL_TABLET | Freq: Four times a day (QID) | ORAL | Status: DC | PRN
  Administered 2018-12-08: 2 via ORAL
  Administered 2018-12-08: 1 via ORAL
  Administered 2018-12-09 (×2): 2 via ORAL
  Administered 2018-12-09: 1 via ORAL
  Administered 2018-12-10 – 2018-12-15 (×4): 2 via ORAL
  Filled 2018-12-05 (×2): qty 1
  Filled 2018-12-05 (×7): qty 2

## 2018-12-05 MED ORDER — VANCOMYCIN HCL IN DEXTROSE 1-5 GM/200ML-% IV SOLN
1000.0000 mg | Freq: Once | INTRAVENOUS | Status: AC
  Administered 2018-12-05: 1000 mg via INTRAVENOUS
  Filled 2018-12-05: qty 200

## 2018-12-05 MED ORDER — VANCOMYCIN HCL 10 G IV SOLR
1250.0000 mg | INTRAVENOUS | Status: AC
  Administered 2018-12-05 – 2018-12-09 (×5): 1250 mg via INTRAVENOUS
  Filled 2018-12-05 (×5): qty 1250

## 2018-12-05 MED ORDER — MORPHINE SULFATE (PF) 2 MG/ML IV SOLN
1.0000 mg | INTRAVENOUS | Status: DC | PRN
  Administered 2018-12-05 (×2): 2 mg via INTRAVENOUS
  Administered 2018-12-06: 1 mg via INTRAVENOUS
  Administered 2018-12-06: 2 mg via INTRAVENOUS
  Administered 2018-12-07: 1 mg via INTRAVENOUS
  Administered 2018-12-08 – 2018-12-14 (×14): 2 mg via INTRAVENOUS
  Administered 2018-12-14: 1 mg via INTRAVENOUS
  Administered 2018-12-15 – 2018-12-16 (×6): 2 mg via INTRAVENOUS
  Filled 2018-12-05 (×27): qty 1

## 2018-12-05 MED ORDER — SODIUM PHOSPHATES 45 MMOLE/15ML IV SOLN
30.0000 mmol | Freq: Once | INTRAVENOUS | Status: AC
  Administered 2018-12-05: 30 mmol via INTRAVENOUS
  Filled 2018-12-05: qty 10

## 2018-12-05 NOTE — Care Management Note (Signed)
Case Management Note  Patient Details  Name: Becky Figueroa MRN: [id number] Date of Birth: 1945/11/03  Subjective/Objective:                  Discharge Readiness Return to top of Osteomyelitis RRG - ISC  Discharge readiness is indicated by patient meeting Recovery Milestones, including ALL of the following: ? Hemodynamic stability NO ARTERIAL BLOOD PRESSURE LINES ? Afebrile TEMP=98.6 ? Most recent blood culture negative N/A ? Need for inpatient surgical management absent  NOT AT THIS TIME ? IV HEPARIN DUE TO DVT,IV NEO DRIP,IV ZOSYN,IV VANCOMYCIN   Action/Plan: Will follow for progression of care and clinical status. Will follow for case management needs none present at this time.  Expected Discharge Date:  (unknown)               Expected Discharge Plan:     In-House Referral:     Discharge planning Services     Post Acute Care Choice:    Choice offered to:     DME Arranged:    DME Agency:     HH Arranged:    HH Agency:     Status of Service:     If discussed at [employer], dates discussed:    Additional Comments:  Golda Acre, RN 12/05/2018, 10:22 AM

## 2018-12-05 NOTE — Progress Notes (Signed)
NAME:  Becky Figueroa, MRN:  [id number], DOB:  [birth year], LOS: 9 ADMISSION DATE:  11/26/2018, CONSULTATION DATE:  2/1 REFERRING MD:  Dr Ashley Royalty, CHIEF COMPLAINT:  Septic Shock   Brief History   73 year old woman with Parkinson's disease, history right LE DVT, admitted with septic shock due to large decubitus ulcer and osteomyelitis.  History of present illness   73 year old woman with Parkinson's disease and dementia, depression, right lower extremity DVT on anticoagulation.  She has been dealing with a large sacral decubitus ulcer, receiving wound care as an outpatient.  She was admitted with lethargy, toxic metabolic encephalopathy in the setting of apparent severe sepsis and osteomyelitis.  MRI of the spine and sacrum 1/31 confirm the osteomyelitis and surrounding myositis.  She had acute renal insufficiency and hyponatremia which have improved with volume resuscitation.  Started on broad-spectrum antibiotics, now on cefepime alone.  She is 13.2 L positive for the admission so far.  2/1 she has developed progressive hypotension.  She received low-dose Lasix earlier in the day (only 10 mg).   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   Significant Hospital Events     Consults:  General surgery Wound care Infectious diseases Cardiology  Procedures:    Significant Diagnostic Tests:  Head CT 1/29 >> no acute abnormality MRI sacrum 1/31 >> osteomyelitis of the lower sacrum and coccyx associated with large decubitus ulcer, diffuse gluteal edema and presacral edema compatible with myositis.  No pyomyositis seen Echocardiogram 2/1 >>  normal left ventricular size and function, EF 60 to 65%, no pericardial effusion, normal valvular function  Micro Data:  Blood 1/29 >> No growth  Antimicrobials:  Vancomycin 1/29 > 1/31 Cefepime 1/29 >>   Interim history/subjective:  No events overnight Off pressors  Objective   Blood pressure 125/83, pulse 93, temperature (!) 97 F (36.1 C), temperature source Axillary, resp. rate (!) 21, height 5\' 6"  (1.676 m), weight 80.1 kg, SpO2 97 %.        Intake/Output Summary (Last 24 hours) at 12/05/2018 0827 Last data filed at 12/05/2018 0522 Gross per 24 hour  Intake 2118.63 ml  Output 1175 ml  Net 943.63 ml   Filed Weights   11/27/18 1400 12/02/18 1212 12/05/18 0513  Weight: 59 kg 59 kg 80.1 kg   Examination: General: Chronically ill appearing female, NAD HENT: [place]/AT, PERRL, EOM-I and MMM Lungs: Bibasilar crackles Cardiovascular: RRR, Nl S1/S2 and -M/R/G Abdomen: Soft, NT, ND and +BS Extremities: 2+ edema and -tenderness Neuro: Awake and interactive, moving all ext to command Skin: Wound picture noted  I reviewed CXR myself, pulmonary edema noted  Resolved Hospital Problem list   Acute renal failure Hyponatremia Discussed with PCCM-NP  Assessment & Plan:  Septic shock.  Source is sacral osteomyelitis with surrounding myositis due to decubitus ulcer. - KVO IVF - D/C neo - Maintain stress dose steroids for now, may need to consider PO supplementation is hemodynamics remain an issue - May need to consider midodrine if BP drops again - Continue zosyn for now - CCS following for wound debridement  - Send cultures as clinically indicated  - Monitor WBC and fever curves - Will re-culture as continued hypotension   Positive I&O Balance + 13 L  and -1.5 liters overnight  Plan - Change CXR to PRN at this point - Will defer lasix to primary at this point - KVO IVF  Toxic metabolic encephalopathy due to sepsis and metabolic disarray - D/C all sedating medications -  Monitor clinically - Frequent re-orientation  History of right lower extremity DVT. - Has been treated with Eliquis, currently on hold to facilitate bedside debridement and heparin infusion off due to Hg drop  History of Parkinson's and dementia - On Sinemet   Relative anemia - Trend CBC  PCCM will sign off, please call back if needed  Labs   CBC: Recent Labs  Lab 12/01/18 0143 12/02/18 0500 12/02/18 1445 12/03/18 0412 12/04/18 0358 12/04/18 1726 12/05/18 0500  WBC 5.3 4.8  --  8.0 6.6  --  4.5  HGB 9.2* 9.8* 9.5* 9.5* 7.3* 9.4* 9.2*  HCT 28.8* 30.8* 28.0* 30.0* 22.9* 29.9* 28.3*  MCV 95.4 94.8  --  95.2 97.4  --  96.9  PLT 155 162  --  174 136*  --  119*   Basic Metabolic Panel: Recent Labs  Lab 12/01/18 0143 12/02/18 0500 12/02/18 1445 12/03/18 0412 12/04/18 0358 12/05/18 0500  NA 137 139 140 140 143 141  K 2.9* 3.1* 3.9 3.6 2.9* 3.9  CL 114* 114*  --  114* 110 110  CO2 19* 21*  --  23 28 27   GLUCOSE 98 139*  --  138* 134* 154*  BUN 10 8  --  9 8 10   CREATININE 0.48 0.46  --  0.50 0.45 0.45  CALCIUM 6.7* 6.9*  --  7.3* 7.7* 7.7*  MG  --  1.6*  --  1.8 1.5* 1.9  PHOS  --  2.0*  --   --  2.0* 1.4*   GFR: Estimated Creatinine Clearance: 67.8 mL/min (by C-G formula based on SCr of 0.45 mg/dL). Recent Labs  Lab 11/29/18 1722 11/30/18 0334 12/01/18 0143 12/02/18 0500 12/03/18 0412 12/04/18 0358 12/05/18 0500  PROCALCITON 0.35 0.32 0.37  --  0.13 <0.10  --   WBC  --  3.9* 5.3 4.8 8.0 6.6 4.5  LATICACIDVEN 1.6  --   --   --   --   --   --     Liver Function Tests: Recent Labs  Lab 12/01/18 0143 12/02/18 0500 12/03/18 0412 12/04/18 0358  AST 82* 44* 31 24  ALT 29 36 23 20  ALKPHOS 133* 140* 134* 102  BILITOT 0.3 0.2* 0.5 0.4  PROT 4.5* 4.5* 4.5* 5.0*  ALBUMIN 1.5* 1.5* 1.7* 3.0*   No results for input(s): LIPASE, AMYLASE in the last 168 hours. No results for input(s): AMMONIA in the last 168 hours.  ABG    Component Value Date/Time    PHART 7.278 (L) 12/02/2018 1445   PCO2ART 47.3 12/02/2018 1445   PO2ART 486.0 (H) 12/02/2018 1445   HCO3 22.1 12/02/2018 1445   TCO2 24 12/02/2018 1445   ACIDBASEDEF 5.0 (H) 12/02/2018 1445   O2SAT 100.0 12/02/2018 1445     Coagulation Profile: No results for input(s): INR, PROTIME in the last 168 hours.  Cardiac Enzymes: No results for input(s): CKTOTAL, CKMB, CKMBINDEX, TROPONINI in the last 168 hours.  HbA1C: No results found for: HGBA1C  CBG: No results for input(s): GLUCAP in the last 168 hours.  Review of Systems:   Denies pain. Marginal appetite. Otherwise negative   Past Medical History  She,  has a past medical history of Anal stenosis, Anxiety, Decubitus ulcer, Dementia  in Parkinson's disease (HCC) (09/18/2018), Depression, Fatty liver, GERD (gastroesophageal reflux disease), Hiatal hernia, History of anal fissures, History of chronic constipation, History of kidney stones, Hyperlipidemia, Left ureteral calculus, Parkinson's disease (HCC), and Wears glasses.   Surgical History    Past Surgical History:  Procedure Laterality Date  . ANAL FISSURECTOMY  1994   and Hemorrhoidectomy  . BOTOX INJECTION N/A 11/18/2013   Procedure: BOTOX INJECTION;  Surgeon: Hart Carwinora M Brodie, MD;  Location: [facility];  Service: Endoscopy;  Laterality: N/A;  . CHOLECYSTECTOMY OPEN  1982  . CYSTOSCOPY WITH RETROGRADE PYELOGRAM, URETEROSCOPY AND STENT PLACEMENT Left 06/06/2016   Procedure: CYSTOSCOPY WITH RETROGRADE PYELOGRAM, URETEROSCOPY AND STENT PLACEMENT;  Surgeon: Barron Alvineavid Grapey, MD;  [facility];  Service: Urology;  Laterality: Left;  . FLEXIBLE SIGMOIDOSCOPY N/A 11/18/2013   Procedure: FLEXIBLE SIGMOIDOSCOPY/ with BOTOX;  Surgeon: Hart Carwinora M Brodie, MD;  Location: [facility];  Service: Endoscopy;  Laterality: N/A;  . HOLMIUM LASER APPLICATION Left 06/06/2016   Procedure: HOLMIUM LASER APPLICATION;  Surgeon: Barron Alvineavid Grapey, MD;  [facility] [place];   Service: Urology;  Laterality: Left;  . IRRIGATION AND DEBRIDEMENT BUTTOCKS N/A 12/02/2018   Procedure: IRRIGATION AND DEBRIDEMENT SACRAL DECUBITUS;  Surgeon: Karie SodaGross, Steven, MD;  Location: [facility];  Service: General;  Laterality: N/A;  . TONSILLECTOMY  age 73  . TOTAL ABDOMINAL HYSTERECTOMY W/ BILATERAL SALPINGOOPHORECTOMY  1984   and Appendectomy     Social History   reports that she has never smoked. She has never used smokeless tobacco. She reports that she does not drink alcohol or use drugs.   Family History   Her family history includes Breast cancer in her maternal aunt; Diabetes in her brother; Pancreatic cancer (age of onset: 6656) in her maternal uncle. There is no history of Colon cancer.   Allergies Allergies  Allergen Reactions  . Sinemet [Carbidopa W-Levodopa] Nausea And Vomiting    Able to tolerate with Zofran  . Sulfa Antibiotics Other (See Comments)    Flushing and hot     Home Medications  Prior to Admission medications   Medication Sig Start Date End Date Taking? Authorizing Provider  ALPRAZolam Prudy Feeler(XANAX) 0.5 MG tablet Take 1 tablet (0.5 mg total) by mouth 3 (three) times daily as needed for anxiety. 11/07/18  Yes Gwyneth SproutPlunkett, Whitney, MD  apixaban (ELIQUIS) 5 MG TABS tablet Take 10 mg by mouth 2 (two) times daily.   Yes [provider]  carbidopa-levodopa (SINEMET IR) 25-100 MG tablet 1/2 tablet twice a day for 3 weeks, then take 1/2 tablet three times a day Patient taking differently: Take 0.5 tablets by mouth 3 (three) times daily.  09/18/18  Yes York SpanielWillis, Charles K, MD  ciprofloxacin (CIPRO) 500 MG tablet Take 500 mg by mouth 2 (two) times daily.   Yes [provider]  lansoprazole (PREVACID) 30 MG capsule Take 30 mg by mouth every morning.    Yes [provider]  Multiple Vitamins-Minerals (ONE-A-DAY WOMENS 50 PLUS PO) Take 1 tablet by mouth daily.   Yes [provider]  Nutritional Supplements (RESOURCE 2.0) LIQD Take 120 mLs by mouth 2  (two) times daily.   Yes [provider]  NUTRITIONAL SUPPLEMENTS PO Take 1 Units by mouth daily at 12 noon. "Magic Cup."   Yes [provider]  ondansetron (ZOFRAN) 4 MG tablet Take 1 tablet (4 mg total) by mouth every 8 (eight) hours as needed for nausea or vomiting. Patient taking differently: Take 4 mg by mouth every  8 (eight) hours. Takes with carbidopa-levodopa 01/16/18  Yes Huston Foley, MD  traMADol (ULTRAM) 50 MG tablet Take 50 mg by mouth every 6 (six) hours as needed for moderate pain.    Yes [provider]  fluconazole (DIFLUCAN) 100 MG tablet Take 1 tablet (100 mg total) by mouth daily. Patient not taking: Reported on 11/26/2018 11/06/18   Darlin Drop, DO

## 2018-12-05 NOTE — Progress Notes (Signed)
[facility]  Date of Admission:  11/26/2018   Total days of antibiotics 10        Day 6 piperacillin tazobactam         ASSESSMENT: Overall, Ms. Edmunds is improving slowly on therapy for large sacral decubitus wound infection complicated by sacrococcygeal osteomyelitis.  However, both sets of recent blood cultures are growing methicillin-resistant coagulase-negative staph.  This may account for her recent hypotension.  I have added vancomycin and ordered repeat blood cultures.  If repeat blood cultures are negative again I would recommend removal of her PICC.    PLAN: 1. Continue piperacillin tazobactam 2. Start vancomycin after repeat blood cultures 3. 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 carbidopa-levodopa  0.5 tablet Oral TID WC  . Chlorhexidine Gluconate Cloth  6 each Topical Daily  . feeding supplement (ENSURE ENLIVE)  237 mL Oral BID BM  . hydrocortisone sodium succinate  50 mg Intravenous Q6H  . mouth rinse  15 mL Mouth Rinse BID  . sodium chloride flush  10-40 mL Intracatheter Q12H   Continuous Infusions: . sodium chloride Stopped (11/29/18 1737)  . sodium chloride    . heparin 1,250 Units/hr (12/05/18 1100)  . phenylephrine (NEO-SYNEPHRINE) Adult infusion Stopped (12/05/18 0645)  . piperacillin-tazobactam (ZOSYN)  IV Stopped (12/05/18 0929)  . sodium phosphate  Dextrose 5% IVPB    . vancomycin     PRN Meds:.Place/Maintain arterial line **AND** sodium chloride, acetaminophen **OR** acetaminophen, ALPRAZolam, fentaNYL (SUBLIMAZE) injection, ondansetron **OR** ondansetron (ZOFRAN) IV,  oxyCODONE-acetaminophen, silver nitrate applicators, sodium chloride flush, traMADol   SUBJECTIVE: She is feeling better.  Her appetite is improving.  Review of Systems: Review of Systems  Constitutional: Positive for weight loss. Negative for chills, diaphoresis and fever.    Allergies  Allergen Reactions  . Sinemet [Carbidopa W-Levodopa] Nausea And Vomiting    Able to tolerate with Zofran  . Sulfa Antibiotics Other (See Comments)    Flushing and hot    OBJECTIVE: Vitals:   12/05/18 1000 12/05/18 1100 12/05/18 1200 12/05/18 1300  BP:      Pulse: 91 92 83 93  Resp: (!) 24 (!) 25 (!) 24 (!) 22  Temp:   (!) 97.4 F (36.3 C)   TempSrc:   Axillary   SpO2: 96% 95% 96% 96%  Weight:      Height:       Body mass index is 28.5 kg/m.  Physical Exam Constitutional:      Comments: She is more alert and talkative.  Her son is visiting.  She is off pressors now.  Skin:    Findings: No rash.     Comments: Right arm PICC site appears normal.  Psychiatric:        Mood and Affect: Mood normal.     Lab Results Lab Results  Component Value Date   WBC 4.5 12/05/2018   HGB 9.2 (L) 12/05/2018   HCT 28.3 (L) 12/05/2018   MCV 96.9 12/05/2018   PLT 119 (L) 12/05/2018    Lab Results  Component Value Date   CREATININE 0.45 12/05/2018  BUN 10 12/05/2018   NA 141 12/05/2018   K 3.9 12/05/2018   CL 110 12/05/2018   CO2 27 12/05/2018    Lab Results  Component Value Date   ALT 20 12/04/2018   AST 24 12/04/2018   ALKPHOS 102 12/04/2018   BILITOT 0.4 12/04/2018     Microbiology: Recent Results (from the past 240 hour(s))  Culture, blood (routine x 2)     Status: None   Collection Time: 11/26/18  3:02 PM  Result Value Ref Range Status   Specimen Description   Final    BLOOD RIGHT ANTECUBITAL Performed at [facility], [address] [place], [place] [address]    Special Requests   Final    BOTTLES DRAWN AEROBIC AND ANAEROBIC Blood Culture adequate  volume Performed at [facility], [address] [place], [place] [address]    Culture   Final    NO GROWTH 5 DAYS Performed at [facility], [address] [place], [place] [address]    Report Status 12/01/2018 FINAL  Final  Culture, blood (routine x 2)     Status: None   Collection Time: 11/26/18  3:02 PM  Result Value Ref Range Status   Specimen Description   Final    BLOOD LEFT ANTECUBITAL Performed at [facility], [address] [place], [place] [address]    Special Requests   Final    BOTTLES DRAWN AEROBIC AND ANAEROBIC Blood Culture adequate volume Performed at [facility], [address] [place], [place] [address]    Culture   Final    NO GROWTH 5 DAYS Performed at [facility], [address] [place], [place] [address]    Report Status 12/01/2018 FINAL  Final  MRSA PCR Screening     Status: None   Collection Time: 11/27/18  2:43 PM  Result Value Ref Range Status   MRSA by PCR NEGATIVE NEGATIVE Final    Comment:        The GeneXpert MRSA Assay (FDA approved for NASAL specimens only), is one component of a comprehensive MRSA colonization surveillance program. It is not intended to diagnose MRSA infection nor to guide or monitor treatment for MRSA infections. Performed at [facility], [address] [place], [place] [address]   Culture, blood (routine x 2)     Status: Abnormal (Preliminary result)   Collection Time: 12/02/18  9:45 AM  Result Value Ref Range Status   Specimen Description   Final    BLOOD LEFT HAND Performed at [facility], [address] [place], [place] [address]    Special Requests   Final    BOTTLES DRAWN AEROBIC ONLY Blood Culture results may not be optimal due to an inadequate volume of blood received in culture bottles Performed at [facility], [address] [place], [place] [address]    Culture   Setup Time   Final    GRAM POSITIVE COCCI IN CLUSTERS AEROBIC BOTTLE ONLY CRITICAL VALUE NOTED.  VALUE IS CONSISTENT WITH PREVIOUSLY REPORTED AND CALLED VALUE. Performed at [facility], [address] [place], [place] [address]    Culture STAPHYLOCOCCUS SPECIES (COAGULASE NEGATIVE) (A)  Final   Report Status PENDING  Incomplete  Culture, blood (routine x 2)     Status: Abnormal (Preliminary result)   Collection Time: 12/02/18  9:53 AM  Result Value Ref Range Status   Specimen Description  Final    BLOOD LEFT HAND Performed at [facility], [address] [place], [place] [address]    Special Requests   Final    BOTTLES DRAWN AEROBIC ONLY Blood Culture results may not be optimal due to an inadequate volume of blood received in culture bottles Performed at [facility], [address] [place], [place] [address]    Culture  Setup Time   Final    GRAM POSITIVE COCCI AEROBIC BOTTLE ONLY Organism ID to follow CRITICAL RESULT CALLED TO, READ BACK BY AND VERIFIED WITH: PHARMD J GADHIA [phone] MLM    Culture (A)  Final    STAPHYLOCOCCUS SPECIES (COAGULASE NEGATIVE) SUSCEPTIBILITIES TO FOLLOW Performed at [facility], [address] [place], [place] [address]    Report Status PENDING  Incomplete  Blood Culture ID Panel (Reflexed)     Status: Abnormal   Collection Time: 12/02/18  9:53 AM  Result Value Ref Range Status   Enterococcus species NOT DETECTED NOT DETECTED Final   Listeria monocytogenes NOT DETECTED NOT DETECTED Final   Staphylococcus species DETECTED (A) NOT DETECTED Final    Comment: Methicillin (oxacillin) resistant coagulase negative staphylococcus. Possible blood culture contaminant (unless isolated from more than one blood culture draw or clinical case suggests pathogenicity). No antibiotic treatment is indicated for blood  culture contaminants. CRITICAL RESULT CALLED TO, READ BACK BY AND VERIFIED WITH: PHARMD J  GADHIA [phone] MLM    Staphylococcus aureus (BCID) NOT DETECTED NOT DETECTED Final   Methicillin resistance DETECTED (A) NOT DETECTED Final    Comment: CRITICAL RESULT CALLED TO, READ BACK BY AND VERIFIED WITH: PHARMD J GADHIA [phone] MLM    Streptococcus species NOT DETECTED NOT DETECTED Final   Streptococcus agalactiae NOT DETECTED NOT DETECTED Final   Streptococcus pneumoniae NOT DETECTED NOT DETECTED Final   Streptococcus pyogenes NOT DETECTED NOT DETECTED Final   Acinetobacter baumannii NOT DETECTED NOT DETECTED Final   Enterobacteriaceae species NOT DETECTED NOT DETECTED Final   Enterobacter cloacae complex NOT DETECTED NOT DETECTED Final   Escherichia coli NOT DETECTED NOT DETECTED Final   Klebsiella oxytoca NOT DETECTED NOT DETECTED Final   Klebsiella pneumoniae NOT DETECTED NOT DETECTED Final   Proteus species NOT DETECTED NOT DETECTED Final   Serratia marcescens NOT DETECTED NOT DETECTED Final   Haemophilus influenzae NOT DETECTED NOT DETECTED Final   Neisseria meningitidis NOT DETECTED NOT DETECTED Final   Pseudomonas aeruginosa NOT DETECTED NOT DETECTED Final   Candida albicans NOT DETECTED NOT DETECTED Final   Candida glabrata NOT DETECTED NOT DETECTED Final   Candida krusei NOT DETECTED NOT DETECTED Final   Candida parapsilosis NOT DETECTED NOT DETECTED Final   Candida tropicalis NOT DETECTED NOT DETECTED Final    Comment: Performed at [facility], [address] [place], [place] [address]  Culture, Urine     Status: None   Collection Time: 12/02/18  6:23 PM  Result Value Ref Range Status   Specimen Description   Final    URINE, CLEAN CATCH Performed at [facility], [address] [place], [place] [address]    Special Requests   Final    NONE Performed at [facility], [address] [place], [place] [address]    Culture   Final    NO GROWTH Performed at [facility], [address]  [place], [place] [address]    Report Status 12/04/2018 FINAL  Final  Cliffton Asters, MD [facility] [phone] pager   [phone] cell 12/05/2018, 2:02 PMPatient ID: Fidela [place], female   DOB: [birth year], 73 y.o.   MRN: [id number]

## 2018-12-05 NOTE — Progress Notes (Addendum)
ANTICOAGULATION CONSULT NOTE  Pharmacy Consult for Heparin Indication: DVT (PTA apixaban on hold)  Allergies  Allergen Reactions  . Sinemet [Carbidopa W-Levodopa] Nausea And Vomiting    Able to tolerate with Zofran  . Sulfa Antibiotics Other (See Comments)    Flushing and hot    Patient Measurements: Height: 5\' 6"  (167.6 cm) Weight: 176 lb 9.4 oz (80.1 kg) IBW/kg (Calculated) : 59.3 Heparin Dosing Weight: TBW  Vital Signs: Temp: 97.4 F (36.3 C) (02/07 1200) Temp Source: Axillary (02/07 1200) Pulse Rate: 93 (02/07 1300)  Labs: Recent Labs    12/03/18 0412 12/04/18 0358 12/04/18 1726 12/04/18 1828 12/05/18 0500 12/05/18 1415  HGB 9.5* 7.3* 9.4*  --  9.2*  --   HCT 30.0* 22.9* 29.9*  --  28.3*  --   PLT 174 136*  --   --  119*  --   HEPARINUNFRC  --   --   --  0.35 0.64 0.59  CREATININE 0.50 0.45  --   --  0.45  --     Estimated Creatinine Clearance: 67.8 mL/min (by C-G formula based on SCr of 0.45 mg/dL).   Medications:  Infusions:  . sodium chloride Stopped (11/29/18 1737)  . sodium chloride    . heparin 1,250 Units/hr (12/05/18 1100)  . phenylephrine (NEO-SYNEPHRINE) Adult infusion Stopped (12/05/18 0645)  . piperacillin-tazobactam (ZOSYN)  IV Stopped (12/05/18 0929)  . sodium phosphate  Dextrose 5% IVPB    . vancomycin      Assessment: 70 yoF admitted on 1/29 with osteomyelitis of sacral decubitus ulcer with surgical consult for debridement. Pt was recently diagnosed with RLE DVT and started on Eliquis. Pharmacy is consulted to dose Heparin IV while PTA Eliquis for DVT is on hold.   Significant events: 1/29 Last apixaban dose taken PTA 1/30 Heparin held at 08:00 for debridement, resumed heparin s/p procedure 1/31-2/1 overnight, pt pulled out line while in MRI, heparin off ~ 1-2 hours 2/4 Heparin off at 02:00 prior to OR debridement.  AET 1533.  Heparin to resume 12 hrs postop per Dr. Michaell Cowing, no bolus. 2/5 Heparin resumed 03:30.  Heparin held ~ 08:00  per RN for oozing/bleeding from wound.  Per CCS, Hold anticoagulation for 24 hours 2/6: Hgb lower today from recent bleeding but no current bleeding per RN and CCS; Ok to resume heparin  Today, 12/05/2018:  CBC:  Hgb but stable after transfused 1 unit; Plt lower but consistent with earlier counts this admission  AM heparin level therapeutic but much higher than yesterday  AKI resolved  See CCS notes regarding wound bleeding.  No IV or line issues per RN.   Goal of Therapy:  Heparin level 0.3-0.7 units/ml Monitor platelets by anticoagulation protocol: Yes   Plan:  Continue heparin IV infusion at 1250 units/hr Will recheck confirmatory level at 8 hrs given steep rise in heparin level overnight Daily Heparin level and CBC  No current plans for further debridement  Bernadene Person, PharmD, BCPS [phone] 12/05/2018, 3:00 PM   ADDENDUM  Repeat heparin level reassuring; will continue at current infusion rate  Daily heparin level and CBC  Bernadene Person, PharmD, BCPS [phone] 12/05/2018, 3:01 PM

## 2018-12-05 NOTE — Progress Notes (Signed)
PROGRESS NOTE    Becky Figueroa  NWG:[id number] DOB: Jun 01, 1946 DOA: 11/26/2018 PCP: Richmond CampbellKaplan, Kristen W., PA-C    Brief Narrative:  73 y.o.femalewith past medical history significant for dementia with Parkinson's disease, depression, decubitus ulcer getting careatoutpatient wound care centerwho was referred from wound care center due to worsening wound.   Per son, patient was also recently diagnosed with DVT on her right lower extremity andwasstarted on anticoagulation. He was found to have a large sacral decubitus ulcer findings worrisome for osteomyelitis.  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 Continue IV antibiotics. Underwent debridement by general surgery Continue dressing changes as per recommendations. Blood cultures have been negative so far.   Volume overload secondary to volume resuscitation in the setting of shock. Repeat chest x-ray today and restart Lasix.   Right lower extremity DVT On IV heparin.   Acute blood loss anemia possibly from debridement. She underwent 1 unit of PRBC transfusion on 2/6.  Hemoglobin stable around 9.   Hypophosphatemia Replaced   Hypomagnesemia and hypokalemia Replaced and repeat level within normal limits.    Coag negative staph in 1 of 4 blood culture bottles Probably a contaminant. Continue to follow blood cultures.    History of Parkinson's disease/dementia Continue  with Sinemet and avoid sedatives at this time.  Toxic metabolic encephalopathy improving.   Moderate protein calorie malnutrition Supplementations as recommended by nutritionist.     DVT prophylaxis: IV heparin Code Status: Full code  family Communication: None at bedside today Disposition Plan: Pending clinical improvement possible transfer to floor today.   Consultants:   PCCM  Wound care  General surgery  ID  Procedures: Debridement of sacral decubitus ulcer Antimicrobials: Vancomycin Zosyn Subjective: Patient reports feeling better today she denies any complaints at this time  Objective: Vitals:   12/05/18 0513 12/05/18 0800 12/05/18 0900 12/05/18 1000  BP:      Pulse:  89 93 91  Resp:   18 (!) 24  Temp:  (!) 97.4 F (36.3 C)    TempSrc:  Oral    SpO2:  98% 94% 96%  Weight: 80.1 kg     Height:        Intake/Output Summary (Last 24 hours) at 12/05/2018 1132 Last data filed at 12/05/2018 1000 Gross per 24 hour  Intake 1856.05 ml  Output 1175 ml  Net 681.05 ml   Filed Weights   11/27/18 1400 12/02/18 1212 12/05/18 0513  Weight: 59 kg 59 kg 80.1 kg    Examination:  General exam: Ill-appearing but not in any distress at this time Respiratory system: Diminished air entry at the bases, no wheezing or rhonchi Cardiovascular system: S1 & S2 heard, RRR. No JVD,  Gastrointestinal system: Abdomen is nondistended, soft and nontender. No organomegaly or masses felt. Normal bowel sounds heard. Central nervous system: Alert and able to answer all questions appropriately  extremities: Symmetric 5 x 5 power. Skin: No rashes, lesions or ulcers Psychiatry:  Mood & affect appropriate.  Data Reviewed: I have personally reviewed following labs and imaging studies  CBC: Recent Labs  Lab 12/01/18 0143 12/02/18 0500 12/02/18 1445 12/03/18 0412 12/04/18 0358 12/04/18 1726 12/05/18 0500  WBC 5.3 4.8  --  8.0 6.6  --  4.5  HGB 9.2* 9.8* 9.5* 9.5* 7.3* 9.4*  9.2*  HCT 28.8* 30.8* 28.0* 30.0* 22.9* 29.9* 28.3*  MCV 95.4 94.8  --  95.2 97.4  --  96.9  PLT 155 162  --  174 136*  --  119*   Basic Metabolic Panel: Recent Labs  Lab 12/01/18 0143 12/02/18 0500 12/02/18 1445 12/03/18 0412 12/04/18 0358 12/05/18 0500  NA 137 139 140 140 143 141  K 2.9* 3.1* 3.9 3.6 2.9* 3.9  CL 114* 114*  --  114* 110 110  CO2 19* 21*  --  23 28 27   GLUCOSE 98 139*  --  138* 134* 154*  BUN 10 8  --  9 8 10   CREATININE 0.48 0.46  --  0.50 0.45 0.45  CALCIUM 6.7* 6.9*  --  7.3* 7.7* 7.7*  MG  --  1.6*  --  1.8 1.5* 1.9  PHOS  --  2.0*  --   --  2.0* 1.4*   GFR: Estimated Creatinine Clearance: 67.8 mL/min (by C-G formula based on SCr of 0.45 mg/dL). Liver Function Tests: Recent Labs  Lab 12/01/18 0143 12/02/18 0500 12/03/18 0412 12/04/18 0358  AST 82* 44* 31 24  ALT 29 36 23 20  ALKPHOS 133* 140* 134* 102  BILITOT 0.3 0.2* 0.5 0.4  PROT 4.5* 4.5* 4.5* 5.0*  ALBUMIN 1.5* 1.5* 1.7* 3.0*   No results for input(s): LIPASE, AMYLASE in the last 168 hours. No results for input(s): AMMONIA in the last 168 hours. Coagulation Profile: No results for input(s): INR, PROTIME in the last 168 hours. Cardiac Enzymes: No results for input(s): CKTOTAL, CKMB, CKMBINDEX, TROPONINI in the last 168 hours. BNP (last 3 results) No results for input(s): PROBNP in the last 8760 hours. HbA1C: No results for input(s): HGBA1C in the last 72 hours. CBG: No results for input(s): GLUCAP in the last 168 hours. Lipid Profile: No results for input(s): CHOL, HDL, LDLCALC, TRIG, CHOLHDL, LDLDIRECT in the last 72 hours. Thyroid Function Tests: No results for input(s): TSH, T4TOTAL, FREET4, T3FREE, THYROIDAB in the last 72 hours. Anemia Panel: No results for input(s): VITAMINB12, FOLATE, FERRITIN, TIBC, IRON, RETICCTPCT in the last 72 hours. Sepsis Labs: Recent Labs  Lab 11/29/18 1722 11/30/18 0334 12/01/18 0143 12/03/18 0412 12/04/18 0358  PROCALCITON 0.35 0.32 0.37  0.13 <0.10  LATICACIDVEN 1.6  --   --   --   --     Recent Results (from the past 240 hour(s))  Culture, blood (routine x 2)     Status: None   Collection Time: 11/26/18  3:02 PM  Result Value Ref Range Status   Specimen Description   Final    BLOOD RIGHT ANTECUBITAL Performed at [facility], [address] [place], [place] [address]    Special Requests   Final    BOTTLES DRAWN AEROBIC AND ANAEROBIC Blood Culture adequate volume Performed at [facility], [address] [place], [place] [address]    Culture   Final    NO GROWTH 5 DAYS Performed at [facility], [address] [place], [place] [address]    Report Status 12/01/2018 FINAL  Final  Culture, blood (routine x 2)     Status: None  Collection Time: 11/26/18  3:02 PM  Result Value Ref Range Status   Specimen Description   Final    BLOOD LEFT ANTECUBITAL Performed at [facility], [address] [place], [place] [address]    Special Requests   Final    BOTTLES DRAWN AEROBIC AND ANAEROBIC Blood Culture adequate volume Performed at [facility], [address], [place], [place] [address]    Culture   Final    NO GROWTH 5 DAYS Performed at [facility], [address] [place], [place] [address]    Report Status 12/01/2018 FINAL  Final  MRSA PCR Screening     Status: None   Collection Time: 11/27/18  2:43 PM  Result Value Ref Range Status   MRSA by PCR NEGATIVE NEGATIVE Final    Comment:        The GeneXpert MRSA Assay (FDA approved for NASAL specimens only), is one component of a comprehensive MRSA colonization surveillance program. It is not intended to diagnose MRSA infection nor to guide or monitor treatment for MRSA infections. Performed at [facility], [address], [place], [place] [address]   Culture, blood (routine x 2)     Status: None (Preliminary result)   Collection Time: 12/02/18   9:45 AM  Result Value Ref Range Status   Specimen Description   Final    BLOOD LEFT HAND Performed at [facility], [address], [place], [place] [address]    Special Requests   Final    BOTTLES DRAWN AEROBIC ONLY Blood Culture results may not be optimal due to an inadequate volume of blood received in culture bottles Performed at [facility], [address] [place], [place] [address]    Culture  Setup Time   Final    GRAM POSITIVE COCCI IN CLUSTERS AEROBIC BOTTLE ONLY CRITICAL VALUE NOTED.  VALUE IS CONSISTENT WITH PREVIOUSLY REPORTED AND CALLED VALUE. Performed at [facility], [address] [place], [place] [address]    Culture GRAM POSITIVE COCCI  Final   Report Status PENDING  Incomplete  Culture, blood (routine x 2)     Status: None (Preliminary result)   Collection Time: 12/02/18  9:53 AM  Result Value Ref Range Status   Specimen Description   Final    BLOOD LEFT HAND Performed at [facility], [address] [place], [place] [address]    Special Requests   Final    BOTTLES DRAWN AEROBIC ONLY Blood Culture results may not be optimal due to an inadequate volume of blood received in culture bottles Performed at [facility], [address], [place], [place] [address]    Culture  Setup Time   Final    GRAM POSITIVE COCCI AEROBIC BOTTLE ONLY Organism ID to follow CRITICAL RESULT CALLED TO, READ BACK BY AND VERIFIED WITH: Thana Ates [id number] MLM Performed at [facility], [address], [place], [place] [address]    Culture GRAM POSITIVE COCCI  Final   Report Status PENDING  Incomplete  Blood Culture ID Panel (Reflexed)     Status: Abnormal   Collection Time: 12/02/18  9:53 AM  Result Value Ref Range Status   Enterococcus species NOT DETECTED NOT DETECTED Final   Listeria monocytogenes NOT DETECTED NOT DETECTED Final   Staphylococcus species DETECTED (A) NOT DETECTED  Final    Comment: Methicillin (oxacillin) resistant coagulase negative staphylococcus. Possible blood culture  contaminant (unless isolated from more than one blood culture draw or clinical case suggests pathogenicity). No antibiotic treatment is indicated for blood  culture contaminants. CRITICAL RESULT CALLED TO, READ BACK BY AND VERIFIED WITH: PHARMD J GADHIA [id number] MLM    Staphylococcus aureus (BCID) NOT DETECTED NOT DETECTED Final   Methicillin resistance DETECTED (A) NOT DETECTED Final    Comment: CRITICAL RESULT CALLED TO, READ BACK BY AND VERIFIED WITH: PHARMD J GADHIA [id number] MLM    Streptococcus species NOT DETECTED NOT DETECTED Final   Streptococcus agalactiae NOT DETECTED NOT DETECTED Final   Streptococcus pneumoniae NOT DETECTED NOT DETECTED Final   Streptococcus pyogenes NOT DETECTED NOT DETECTED Final   Acinetobacter baumannii NOT DETECTED NOT DETECTED Final   Enterobacteriaceae species NOT DETECTED NOT DETECTED Final   Enterobacter cloacae complex NOT DETECTED NOT DETECTED Final   Escherichia coli NOT DETECTED NOT DETECTED Final   Klebsiella oxytoca NOT DETECTED NOT DETECTED Final   Klebsiella pneumoniae NOT DETECTED NOT DETECTED Final   Proteus species NOT DETECTED NOT DETECTED Final   Serratia marcescens NOT DETECTED NOT DETECTED Final   Haemophilus influenzae NOT DETECTED NOT DETECTED Final   Neisseria meningitidis NOT DETECTED NOT DETECTED Final   Pseudomonas aeruginosa NOT DETECTED NOT DETECTED Final   Candida albicans NOT DETECTED NOT DETECTED Final   Candida glabrata NOT DETECTED NOT DETECTED Final   Candida krusei NOT DETECTED NOT DETECTED Final   Candida parapsilosis NOT DETECTED NOT DETECTED Final   Candida tropicalis NOT DETECTED NOT DETECTED Final    Comment: Performed at [facility], [address] [place], [place] [address]  Culture, Urine     Status: None   Collection Time: 12/02/18  6:23 PM  Result Value Ref Range Status   Specimen  Description   Final    URINE, CLEAN CATCH Performed at [facility], [address] [place], [place] [address]    Special Requests   Final    NONE Performed at [facility], [address] [place], [place] [address]    Culture   Final    NO GROWTH Performed at [facility], [address], [place], [place] [address]    Report Status 12/04/2018 FINAL  Final         Radiology Studies: No results found.      Scheduled Meds: . carbidopa-levodopa  0.5 tablet Oral TID WC  . Chlorhexidine Gluconate Cloth  6 each Topical Daily  . feeding supplement (ENSURE ENLIVE)  237 mL Oral BID BM  . hydrocortisone sodium succinate  50 mg Intravenous Q6H  . mouth rinse  15 mL Mouth Rinse BID  . sodium chloride flush  10-40 mL Intracatheter Q12H   Continuous Infusions: . sodium chloride Stopped (11/29/18 1737)  . sodium chloride    . heparin 1,250 Units/hr (12/05/18 1000)  . phenylephrine (NEO-SYNEPHRINE) Adult infusion Stopped (12/05/18 0645)  . piperacillin-tazobactam (ZOSYN)  IV Stopped (12/05/18 0929)  . vancomycin       LOS: 9 days    Time spent: 36 minutes    Kathlen Mody, MD [facility] Pager [phone]  If 7PM-7AM, please contact night-coverage [email or web address] Password [facility] 12/05/2018, 11:32 AM

## 2018-12-05 NOTE — Progress Notes (Signed)
LCSW following for disposition.   LCSW received call from son, Lorin PicketScott who now feels that facility cannot meet patients needs. [facility]  Family would like a new facility. LCSW will fax patient out to facilities.   Beulah GandyBernette Krissi Willaims, LSCW [facility] CSW [phone]

## 2018-12-05 NOTE — Progress Notes (Addendum)
Pharmacy Antibiotic Note  Becky Figueroa is a 73 y.o. female admitted on 11/26/2018 with septic shock d/t large decubitus ulcer and osteomyelitis. Started on Zosyn but with failure to improve, ID restarting vancomycin with Pharmacy to dose.  Today, 12/05/2018:  D10 abx  WBC stable WNL  Afebrile since admission  SCr back to baseline (falsely low d/t Parkinson's)  Plan:  Vancomycin 1000 mg IV now, then 1250 mg IV q24 hr (est AUC 494 based on SCr rounded to 1 with Parkinson's Dz; Vd 0.72)  Measure vancomycin AUC at steady state as indicated  Continue Zosyn 3.375 g IV q8 hr as ordered  Daily SCr while on Vanc/Zosyn combo   Height: 5\' 6"  (167.6 cm) Weight: 176 lb 9.4 oz (80.1 kg) IBW/kg (Calculated) : 59.3  Temp (24hrs), Avg:97.7 F (36.5 C), Min:97 F (36.1 C), Max:98.6 F (37 C)  Recent Labs  Lab 11/29/18 1722  12/01/18 0143 12/02/18 0500 12/03/18 0412 12/04/18 0358 12/05/18 0500  WBC  --    < > 5.3 4.8 8.0 6.6 4.5  CREATININE  --    < > 0.48 0.46 0.50 0.45 0.45  LATICACIDVEN 1.6  --   --   --   --   --   --    < > = values in this interval not displayed.    Estimated Creatinine Clearance: 67.8 mL/min (by C-G formula based on SCr of 0.45 mg/dL).    Allergies  Allergen Reactions  . Sinemet [Carbidopa W-Levodopa] Nausea And Vomiting    Able to tolerate with Zofran  . Sulfa Antibiotics Other (See Comments)    Flushing and hot    Antimicrobials this admission:  PTA cipro 1/28 >> (7 days Rx) 1/29 Vancomycin >> 2/1; resume 2/7 >> 1/29 Cefepime >> 2/2 2/2 Zosyn >>   Dose adjustments this admission:   Microbiology results:  1/23 Sacral wound Cx: E coli R-Amp, Unasyn, Ancef 1/29 BCx: NGF 1/30 MRSA PCR: neg 2/4 BCx: 1/2 (1/2 bottles) MR-CoNS 2/4 UCx: NGF  Thank you for allowing pharmacy to be a part of this patient's care.  Bernadene Person, PharmD, BCPS [phone] 12/05/2018, 10:42 AM

## 2018-12-06 LAB — CULTURE, BLOOD (ROUTINE X 2)

## 2018-12-06 LAB — MAGNESIUM: Magnesium: 1.6 mg/dL — ABNORMAL LOW (ref 1.7–2.4)

## 2018-12-06 LAB — CBC
HCT: 27.7 % — ABNORMAL LOW (ref 36.0–46.0)
HEMOGLOBIN: 8.6 g/dL — AB (ref 12.0–15.0)
MCH: 29.8 pg (ref 26.0–34.0)
MCHC: 31 g/dL (ref 30.0–36.0)
MCV: 95.8 fL (ref 80.0–100.0)
Platelets: 124 10*3/uL — ABNORMAL LOW (ref 150–400)
RBC: 2.89 MIL/uL — ABNORMAL LOW (ref 3.87–5.11)
RDW: 15.4 % (ref 11.5–15.5)
WBC: 3.1 10*3/uL — ABNORMAL LOW (ref 4.0–10.5)
nRBC: 0 % (ref 0.0–0.2)

## 2018-12-06 LAB — BASIC METABOLIC PANEL
Anion gap: 5 (ref 5–15)
BUN: 11 mg/dL (ref 8–23)
CO2: 28 mmol/L (ref 22–32)
Calcium: 7.6 mg/dL — ABNORMAL LOW (ref 8.9–10.3)
Chloride: 108 mmol/L (ref 98–111)
Creatinine, Ser: 0.44 mg/dL (ref 0.44–1.00)
GFR calc Af Amer: >60 mL/min (ref >60)
GFR calc non Af Amer: >60 mL/min (ref >60)
Glucose, Bld: 149 mg/dL — ABNORMAL HIGH (ref 70–99)
Potassium: 3.6 mmol/L (ref 3.5–5.1)
Sodium: 141 mmol/L (ref 135–145)

## 2018-12-06 LAB — PHOSPHORUS: Phosphorus: 2.4 mg/dL — ABNORMAL LOW (ref 2.5–4.6)

## 2018-12-06 LAB — HEPARIN LEVEL (UNFRACTIONATED): HEPARIN UNFRACTIONATED: 0.66 [IU]/mL (ref 0.30–0.70)

## 2018-12-06 MED ORDER — POTASSIUM CHLORIDE CRYS ER 20 MEQ PO TBCR
40.0000 meq | EXTENDED_RELEASE_TABLET | Freq: Once | ORAL | Status: AC
  Administered 2018-12-06: 40 meq via ORAL
  Filled 2018-12-06: qty 2

## 2018-12-06 MED ORDER — FUROSEMIDE 10 MG/ML IJ SOLN
40.0000 mg | Freq: Two times a day (BID) | INTRAMUSCULAR | Status: DC
  Administered 2018-12-06 – 2018-12-11 (×11): 40 mg via INTRAVENOUS
  Filled 2018-12-06 (×11): qty 4

## 2018-12-06 MED ORDER — MAGNESIUM SULFATE 2 GM/50ML IV SOLN
2.0000 g | Freq: Once | INTRAVENOUS | Status: AC
  Administered 2018-12-06: 2 g via INTRAVENOUS
  Filled 2018-12-06: qty 50

## 2018-12-06 MED ORDER — SODIUM PHOSPHATES 45 MMOLE/15ML IV SOLN
30.0000 mmol | Freq: Once | INTRAVENOUS | Status: AC
  Administered 2018-12-06: 30 mmol via INTRAVENOUS
  Filled 2018-12-06: qty 10
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
## 2018-12-06 NOTE — Progress Notes (Signed)
ANTICOAGULATION CONSULT NOTE  Pharmacy Consult for Heparin Indication: DVT (PTA apixaban on hold)  Allergies  Allergen Reactions  . Sinemet [Carbidopa W-Levodopa] Nausea And Vomiting    Able to tolerate with Zofran  . Sulfa Antibiotics Other (See Comments)    Flushing and hot   Patient Measurements: Height: 5\' 6"  (167.6 cm) Weight: 176 lb 9.4 oz (80.1 kg) IBW/kg (Calculated) : 59.3 Heparin Dosing Weight: TBW  Vital Signs: Temp: 98 F (36.7 C) (02/08 0353) Temp Source: Axillary (02/08 0353) Pulse Rate: 87 (02/08 0400)  Labs: Recent Labs    12/04/18 0358 12/04/18 1726  12/05/18 0500 12/05/18 1415 12/06/18 0247  HGB 7.3* 9.4*  --  9.2*  --  8.6*  HCT 22.9* 29.9*  --  28.3*  --  27.7*  PLT 136*  --   --  119*  --  124*  HEPARINUNFRC  --   --    < > 0.64 0.59 0.66  CREATININE 0.45  --   --  0.45  --  0.44   < > = values in this interval not displayed.   Estimated Creatinine Clearance: 67.8 mL/min (by C-G formula based on SCr of 0.44 mg/dL).  Medications:  Infusions:  . sodium chloride Stopped (11/29/18 1737)  . sodium chloride    . heparin 1,250 Units/hr (12/06/18 0402)  . phenylephrine (NEO-SYNEPHRINE) Adult infusion Stopped (12/05/18 0645)  . piperacillin-tazobactam (ZOSYN)  IV 3.375 g (12/06/18 0502)  . vancomycin Stopped (12/05/18 2234)   Assessment: 61 yoF admitted on 1/29 with osteomyelitis of sacral decubitus ulcer with surgical consult for debridement. Pt was recently diagnosed with RLE DVT and started on Eliquis. Pharmacy is consulted to dose Heparin IV while PTA Eliquis for DVT is on hold.   Significant events: 1/29 Last apixaban dose taken PTA 1/30 Heparin held at 08:00 for debridement, resumed heparin s/p procedure 1/31-2/1 overnight, pt pulled out line while in MRI, heparin off ~ 1-2 hours 2/4 Heparin off at 02:00 prior to OR debridement.  AET 1533.  Heparin to resume 12 hrs postop per Dr. Michaell Cowing, no bolus. 2/5 Heparin resumed 03:30.  Heparin held ~  08:00 per RN for oozing/bleeding from wound.  Per CCS, Hold anticoagulation for 24 hours 2/6: Hgb lower today from recent bleeding but no current bleeding per RN and CCS; Ok to resume heparin  Today, 12/06/2018:  CBC:  Hgb remains low, but stable   AM heparin level therapeutic at 0.66 but increased  AKI resolved  See CCS notes regarding wound bleeding.   IV site with some oozing per RN.   Goal of Therapy:  Heparin level 0.3-0.7 units/ml Monitor platelets by anticoagulation protocol: Yes   Plan:  Reduce heparin IV infusion to 1100 units/hr Daily Heparin level and CBC  No current plans for further debridement  Otho Bellows PharmD Pager [phone] 12/06/2018, 9:09 AM

## 2018-12-07 LAB — CBC
HEMATOCRIT: 27.3 % — AB (ref 36.0–46.0)
HEMOGLOBIN: 8.6 g/dL — AB (ref 12.0–15.0)
MCH: 31 pg (ref 26.0–34.0)
MCHC: 31.5 g/dL (ref 30.0–36.0)
MCV: 98.6 fL (ref 80.0–100.0)
Platelets: 124 10*3/uL — ABNORMAL LOW (ref 150–400)
RBC: 2.77 MIL/uL — ABNORMAL LOW (ref 3.87–5.11)
RDW: 15.3 % (ref 11.5–15.5)
WBC: 2.9 10*3/uL — ABNORMAL LOW (ref 4.0–10.5)
nRBC: 0 % (ref 0.0–0.2)

## 2018-12-07 LAB — BASIC METABOLIC PANEL
Anion gap: 7 (ref 5–15)
BUN: 10 mg/dL (ref 8–23)
CO2: 34 mmol/L — ABNORMAL HIGH (ref 22–32)
Calcium: 7.3 mg/dL — ABNORMAL LOW (ref 8.9–10.3)
Chloride: 101 mmol/L (ref 98–111)
Creatinine, Ser: 0.43 mg/dL — ABNORMAL LOW (ref 0.44–1.00)
GFR calc Af Amer: >60 mL/min (ref >60)
Glucose, Bld: 143 mg/dL — ABNORMAL HIGH (ref 70–99)
Potassium: 2.1 mmol/L — CL (ref 3.5–5.1)
Sodium: 142 mmol/L (ref 135–145)

## 2018-12-07 LAB — MAGNESIUM: Magnesium: 1.7 mg/dL (ref 1.7–2.4)

## 2018-12-07 LAB — HEPARIN LEVEL (UNFRACTIONATED): Heparin Unfractionated: 0.65 IU/mL (ref 0.30–0.70)

## 2018-12-07 LAB — PHOSPHORUS: PHOSPHORUS: 3 mg/dL (ref 2.5–4.6)

## 2018-12-07 MED ORDER — POTASSIUM CHLORIDE CRYS ER 20 MEQ PO TBCR
40.0000 meq | EXTENDED_RELEASE_TABLET | Freq: Four times a day (QID) | ORAL | Status: AC
  Administered 2018-12-07 (×3): 40 meq via ORAL
  Filled 2018-12-07 (×3): qty 2

## 2018-12-07 MED ORDER — POTASSIUM CHLORIDE CRYS ER 20 MEQ PO TBCR: 40.0000 meq | EXTENDED_RELEASE_TABLET | Freq: Three times a day (TID) | ORAL | Status: DC

## 2018-12-07 MED ORDER — APIXABAN 5 MG PO TABS
5.0000 mg | ORAL_TABLET | Freq: Two times a day (BID) | ORAL | Status: DC
  Administered 2018-12-07 – 2018-12-16 (×18): 5 mg via ORAL
  Filled 2018-12-07 (×18): qty 1

## 2018-12-07 MED ORDER — POTASSIUM CHLORIDE CRYS ER 20 MEQ PO TBCR
40.0000 meq | EXTENDED_RELEASE_TABLET | Freq: Two times a day (BID) | ORAL | Status: DC
  Administered 2018-12-07: 40 meq via ORAL
  Filled 2018-12-07: qty 2

## 2018-12-07 NOTE — Progress Notes (Signed)
On assessment of PICC line ,old bloody drainage noted on 12/06/18 after dressing change done the night before. Consulted with IV team . Recommended putting pressure on the dressing change, and leaving the old dressing on for 48hrs. Will change dressing on 12/08/18.
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
## 2018-12-07 NOTE — Progress Notes (Addendum)
ANTICOAGULATION CONSULT NOTE  Pharmacy Consult for apixaban Indication: DVT   Allergies  Allergen Reactions  . Sinemet [Carbidopa W-Levodopa] Nausea And Vomiting    Able to tolerate with Zofran  . Sulfa Antibiotics Other (See Comments)    Flushing and hot   Patient Measurements: Height: 5\' 6"  (167.6 cm) Weight: 176 lb 9.4 oz (80.1 kg) IBW/kg (Calculated) : 59.3 Heparin Dosing Weight: TBW  Vital Signs: Temp: 97.6 F (36.4 C) (02/09 1200) Temp Source: Oral (02/09 1200) Pulse Rate: 88 (02/09 0800)  Labs: Recent Labs    12/05/18 0500 12/05/18 1415 12/06/18 0247 12/07/18 0338  HGB 9.2*  --  8.6* 8.6*  HCT 28.3*  --  27.7* 27.3*  PLT 119*  --  124* 124*  HEPARINUNFRC 0.64 0.59 0.66 0.65  CREATININE 0.45  --  0.44 0.43*   Estimated Creatinine Clearance: 67.8 mL/min (A) (by C-G formula based on SCr of 0.43 mg/dL (L)).  Medications:  Infusions:  . sodium chloride Stopped (11/29/18 1737)  . phenylephrine (NEO-SYNEPHRINE) Adult infusion Stopped (12/05/18 0645)  . piperacillin-tazobactam (ZOSYN)  IV Stopped (12/07/18 0902)  . vancomycin Stopped (12/07/18 0008)   Assessment: 71 yoF admitted on 1/29 with osteomyelitis of sacral decubitus ulcer with surgical consult for debridement. Pt was recently diagnosed with RLE DVT and started on Eliquis. Pharmacy is consulted to dose Heparin IV while PTA Eliquis for DVT is on hold.   Significant events: 1/29 Last apixaban dose taken PTA 1/30 Heparin held at 08:00 for debridement, resumed heparin s/p procedure 1/31-2/1 overnight, pt pulled out line while in MRI, heparin off ~ 1-2 hours 2/4 Heparin off at 02:00 prior to OR debridement.  AET 1533.  Heparin to resume 12 hrs postop per Dr. Michaell Cowing, no bolus. 2/5 Heparin resumed 03:30.  Heparin held ~ 08:00 per RN for oozing/bleeding from wound.  Per CCS, Hold anticoagulation for 24 hours 2/6: Hgb lower today from recent bleeding but no current bleeding per RN and CCS; Ok to resume  heparin  Today, 12/07/2018:  CBC:  Hgb remains low, but stable   AM heparin level therapeutic at 0.65, remains at high end of range despite rate decrease  AKI resolved  IV site with some oozing 2/8 per RN  PM 12/07/18 2:55 PM  Orders to transition back to apixaban   Plan:   Transition back to apixaban 5 mg bid  Will instruct RN to discontinue heparin infusion at time of initial apixaban dose  Luisa Hart, PharmD Clinical Pharmacist Pager # [phone]  12/07/2018, 2:55 PM

## 2018-12-07 NOTE — Progress Notes (Signed)
ANTICOAGULATION CONSULT NOTE  Pharmacy Consult for Heparin Indication: DVT (PTA apixaban on hold)  Allergies  Allergen Reactions  . Sinemet [Carbidopa W-Levodopa] Nausea And Vomiting    Able to tolerate with Zofran  . Sulfa Antibiotics Other (See Comments)    Flushing and hot   Patient Measurements: Height: 5\' 6"  (167.6 cm) Weight: 176 lb 9.4 oz (80.1 kg) IBW/kg (Calculated) : 59.3 Heparin Dosing Weight: TBW  Vital Signs: Temp: 97.5 F (36.4 C) (02/09 0814) Temp Source: Axillary (02/09 0814) Pulse Rate: 88 (02/09 0800)  Labs: Recent Labs    12/05/18 0500 12/05/18 1415 12/06/18 0247 12/07/18 0338  HGB 9.2*  --  8.6* 8.6*  HCT 28.3*  --  27.7* 27.3*  PLT 119*  --  124* 124*  HEPARINUNFRC 0.64 0.59 0.66 0.65  CREATININE 0.45  --  0.44 0.43*   Estimated Creatinine Clearance: 67.8 mL/min (A) (by C-G formula based on SCr of 0.43 mg/dL (L)).  Medications:  Infusions:  . sodium chloride Stopped (11/29/18 1737)  . sodium chloride    . heparin 1,100 Units/hr (12/07/18 0600)  . phenylephrine (NEO-SYNEPHRINE) Adult infusion Stopped (12/05/18 0645)  . piperacillin-tazobactam (ZOSYN)  IV 12.5 mL/hr at 12/07/18 0600  . vancomycin Stopped (12/07/18 0008)   Assessment: 26 yoF admitted on 1/29 with osteomyelitis of sacral decubitus ulcer with surgical consult for debridement. Pt was recently diagnosed with RLE DVT and started on Eliquis. Pharmacy is consulted to dose Heparin IV while PTA Eliquis for DVT is on hold.   Significant events: 1/29 Last apixaban dose taken PTA 1/30 Heparin held at 08:00 for debridement, resumed heparin s/p procedure 1/31-2/1 overnight, pt pulled out line while in MRI, heparin off ~ 1-2 hours 2/4 Heparin off at 02:00 prior to OR debridement.  AET 1533.  Heparin to resume 12 hrs postop per Dr. Michaell Cowing, no bolus. 2/5 Heparin resumed 03:30.  Heparin held ~ 08:00 per RN for oozing/bleeding from wound.  Per CCS, Hold anticoagulation for 24 hours 2/6: Hgb  lower today from recent bleeding but no current bleeding per RN and CCS; Ok to resume heparin  Today, 12/07/2018:  CBC:  Hgb remains low, but stable   AM heparin level therapeutic at 0.65, remains at high end of range despite rate decrease  AKI resolved  IV site with some oozing 2/8 per RN  Goal of Therapy:  Heparin level 0.3-0.7 units/ml Monitor platelets by anticoagulation protocol: Yes   Plan:  Reduce heparin IV infusion to 950 units/hr Daily Heparin level and CBC  No current plans noted for further debridement  May transition back to Elesa Massed PharmD Pager [phone] 12/07/2018, 8:47 AM

## 2018-12-07 NOTE — Progress Notes (Signed)
CRITICAL VALUE ALERT  Critical Value:  K+ 2.1  Date & Time Notied:  12/07/2018 0625  Provider Notified: Bruna Potter, NP  Orders Received/Actions taken: awaiting new orders

## 2018-12-07 NOTE — Evaluation (Signed)
Clinical/Bedside Swallow Evaluation Patient Details  Name: Becky Figueroa MRN: [id number] Date of Birth: [birth year]  Today's Date: 12/07/2018 Time: SLP Start Time (ACUTE ONLY): 1713 SLP Stop Time (ACUTE ONLY): 1729 SLP Time Calculation (min) (ACUTE ONLY): 16 min  Past Medical History:  Past Medical History:  Diagnosis Date  . Anal stenosis   . Anxiety   . Decubitus ulcer   . 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 Depression   . Fatty liver   . GERD (gastroesophageal reflux disease)   . Hiatal hernia   . History of anal fissures   . History of chronic constipation   . History of kidney stones    1973  . Hyperlipidemia   . Left ureteral calculus   . Parkinson's disease (HCC)   . Wears glasses    Past Surgical History:  Past Surgical History:  Procedure Laterality Date  . ANAL FISSURECTOMY  1994   and Hemorrhoidectomy  . BOTOX INJECTION N/A 11/18/2013   Procedure: BOTOX INJECTION;  Surgeon: Hart Carwinora M Brodie, MD;  Location: [facility];  Service: Endoscopy;  Laterality: N/A;  . CHOLECYSTECTOMY OPEN  1982  . CYSTOSCOPY WITH RETROGRADE PYELOGRAM, URETEROSCOPY AND STENT PLACEMENT Left 06/06/2016   Procedure: CYSTOSCOPY WITH RETROGRADE PYELOGRAM, URETEROSCOPY AND STENT PLACEMENT;  Surgeon: Barron Alvineavid Grapey, MD;  [facility];  Service: Urology;  Laterality: Left;  . FLEXIBLE SIGMOIDOSCOPY N/A 11/18/2013   Procedure: FLEXIBLE SIGMOIDOSCOPY/ with BOTOX;  Surgeon: Hart Carwinora M Brodie, MD;  Location: [facility];  Service: Endoscopy;  Laterality: N/A;  . HOLMIUM LASER APPLICATION Left 06/06/2016   Procedure: HOLMIUM LASER APPLICATION;  Surgeon: Barron Alvineavid Grapey, MD;  [facility];  Service: Urology;  Laterality: Left;  . IRRIGATION AND DEBRIDEMENT BUTTOCKS N/A 12/02/2018   Procedure: IRRIGATION AND DEBRIDEMENT SACRAL DECUBITUS;  Surgeon: Karie SodaGross, Steven, MD;  Location: [facility];  Service: General;  Laterality: N/A;  . TONSILLECTOMY  age 73  .  TOTAL ABDOMINAL HYSTERECTOMY W/ BILATERAL SALPINGOOPHORECTOMY  1984   and Appendectomy   HPI:  73 y.o.femalewith past medical history significant for dementia with Parkinson's disease, GERD, hiatal hernia, depression, decubitus ulcer admitted from wound center for suspicion of infected wound and worseningmental status.RN requests swallow assessment for advancement of diet from puree texture. BSE 11/03/18 (+) s/s aspiration with thin via straw and 3 oz water test; regular and thin liquids recommended. RN stated pt was thought to have aspirated. CXR Similar appearance to the study of 2 days ago. Probable mild pulmonary edema, possibly with associated scattered infiltrates. Small right effusion.   Assessment / Plan / Recommendation Clinical Impression  Pt pleasant and cooperative however confused, hard of hearing and anxious (stated "if I could get over this depression"). Slightly reclined as pt declined upright positioning despite encouragement (likely due to wound). There were no outward/detectable indications of aspiration however she is deconditioned, weak, cognitively impaired with Parkinson's all which increase risk of dysphagia. Mastication with solid texture appeared timely, independently checking/removing debris from cracker. Recommend pt upgrade to Dys 2 (ground-minced), continue thin, pills whole in puree or crush if needed. Family was not present to discuss goals related to nutrition (comfort versus more aggresive). Recommend follow up with upgraded texture and family education/discussion re: dysphagia which will likely worsen as disease progresses.        **OF NOTE: Pt eating dinner with RN and pt has had several coughing episodes.    SLP Visit Diagnosis: Dysphagia, unspecified (R13.10)    Aspiration Risk  Moderate  aspiration risk;Mild aspiration risk    Diet Recommendation Dysphagia 2 (Fine chop);Thin liquid   Liquid Administration via: Cup;Straw Medication Administration: Whole  meds with puree Supervision: Staff to assist with self feeding;Full supervision/cueing for compensatory strategies Compensations: Slow rate;Small sips/bites;Minimize environmental distractions Postural Changes: Seated upright at 90 degrees;Remain upright for at least 30 minutes after po intake    Other  Recommendations Oral Care Recommendations: Oral care BID   Follow up Recommendations Skilled Nursing facility      Frequency and Duration min 2x/week  2 weeks       Prognosis Prognosis for Safe Diet Advancement: Fair Barriers to Reach Goals: Cognitive deficits;Severity of deficits      Swallow Study   General HPI: 73 y.o.femalewith past medical history significant for dementia with Parkinson's disease, GERD, hiatal hernia, depression, decubitus ulcer admitted from wound center for suspicion of infected wound and worseningmental status.RN requests swallow assessment for advancement of diet from puree texture. BSE 11/03/18 (+) s/s aspiration with thin via straw and 3 oz water test; regular and thin liquids recommended. RN stated pt was thought to have aspirated. CXR Similar appearance to the study of 2 days ago. Probable mild pulmonary edema, possibly with associated scattered infiltrates. Small right effusion. Type of Study: Bedside Swallow Evaluation Previous Swallow Assessment: see HPI Diet Prior to this Study: Dysphagia 1 (puree) Temperature Spikes Noted: No Respiratory Status: Room air History of Recent Intubation: No Behavior/Cognition: Alert;Cooperative;Requires cueing Oral Cavity Assessment: Other (comment)(lingual candidia) Oral Care Completed by SLP: No Oral Cavity - Dentition: Dentures, top(missing posterior lower) Vision: Functional for self-feeding Self-Feeding Abilities: Needs set up;Needs assist Patient Positioning: Upright in bed Baseline Vocal Quality: Normal Volitional Cough: Strong Volitional Swallow: Able to elicit    Oral/Motor/Sensory Function Overall Oral  Motor/Sensory Function: Mild impairment Facial Symmetry: Abnormal symmetry left Facial Strength: Reduced left Lingual ROM: Within Functional Limits Lingual Symmetry: Within Functional Limits   Ice Chips Ice chips: Not tested   Thin Liquid Thin Liquid: Within functional limits Presentation: Cup;Straw    Nectar Thick Nectar Thick Liquid: Not tested   Honey Thick Honey Thick Liquid: Not tested   Puree Puree: Within functional limits   Solid     Solid: Within functional limits      Royce Macadamia 12/07/2018,5:46 PM   Breck Coons Jeff.Ed Nurse, children's [phone] Office [phone]

## 2018-12-07 NOTE — Progress Notes (Signed)
Spoke with family and patient at bedside in regards to current bed offers and SNF placement at d/c. Family does not want patient to return to [facility].  Provided current offers: [facility]  Family has strong preference for [facility].   CSW will continue to follow throughout hospitalization to prepare for discharge to facility.   Enid Cutter, MSW, LCSWA Clinical Social Work [phone]

## 2018-12-08 LAB — IRON AND TIBC
IRON: 38 ug/dL (ref 28–170)
Saturation Ratios: 25 % (ref 10.4–31.8)
TIBC: 155 ug/dL — AB (ref 250–450)
UIBC: 117 ug/dL

## 2018-12-08 LAB — BASIC METABOLIC PANEL
Anion gap: 7 (ref 5–15)
BUN: 11 mg/dL (ref 8–23)
CO2: 38 mmol/L — ABNORMAL HIGH (ref 22–32)
Calcium: 7.2 mg/dL — ABNORMAL LOW (ref 8.9–10.3)
Chloride: 97 mmol/L — ABNORMAL LOW (ref 98–111)
Creatinine, Ser: 0.44 mg/dL (ref 0.44–1.00)
GFR calc Af Amer: >60 mL/min (ref >60)
GFR calc non Af Amer: >60 mL/min (ref >60)
Glucose, Bld: 138 mg/dL — ABNORMAL HIGH (ref 70–99)
POTASSIUM: 2.3 mmol/L — AB (ref 3.5–5.1)
Sodium: 142 mmol/L (ref 135–145)

## 2018-12-08 LAB — RETICULOCYTES
Immature Retic Fract: 22.6 % — ABNORMAL HIGH (ref 2.3–15.9)
RBC.: 2.92 MIL/uL — ABNORMAL LOW (ref 3.87–5.11)
Retic Count, Absolute: 115 10*3/uL (ref 19.0–186.0)
Retic Ct Pct: 3.9 % — ABNORMAL HIGH (ref 0.4–3.1)

## 2018-12-08 LAB — CBC
HCT: 28.4 % — ABNORMAL LOW (ref 36.0–46.0)
Hemoglobin: 8.8 g/dL — ABNORMAL LOW (ref 12.0–15.0)
MCH: 30.1 pg (ref 26.0–34.0)
MCHC: 31 g/dL (ref 30.0–36.0)
MCV: 97.3 fL (ref 80.0–100.0)
Platelets: 134 10*3/uL — ABNORMAL LOW (ref 150–400)
RBC: 2.92 MIL/uL — ABNORMAL LOW (ref 3.87–5.11)
RDW: 15.7 % — ABNORMAL HIGH (ref 11.5–15.5)
WBC: 3.2 10*3/uL — ABNORMAL LOW (ref 4.0–10.5)
nRBC: 0 % (ref 0.0–0.2)

## 2018-12-08 LAB — FOLATE: Folate: 9.1 ng/mL (ref >5.9)

## 2018-12-08 LAB — FERRITIN: Ferritin: 292 ng/mL (ref 11–307)

## 2018-12-08 LAB — MAGNESIUM: Magnesium: 1.5 mg/dL — ABNORMAL LOW (ref 1.7–2.4)

## 2018-12-08 LAB — VITAMIN B12: Vitamin B-12: 317 pg/mL (ref 180–914)

## 2018-12-08 MED ORDER — POTASSIUM CHLORIDE CRYS ER 20 MEQ PO TBCR: 40.0000 meq | EXTENDED_RELEASE_TABLET | Freq: Two times a day (BID) | ORAL | Status: DC

## 2018-12-08 MED ORDER — MAGNESIUM SULFATE 2 GM/50ML IV SOLN
2.0000 g | Freq: Once | INTRAVENOUS | Status: AC
  Administered 2018-12-08: 2 g via INTRAVENOUS
  Filled 2018-12-08: qty 50

## 2018-12-08 MED ORDER — POTASSIUM CHLORIDE CRYS ER 20 MEQ PO TBCR
40.0000 meq | EXTENDED_RELEASE_TABLET | Freq: Three times a day (TID) | ORAL | Status: DC
  Administered 2018-12-08 – 2018-12-16 (×24): 40 meq via ORAL
  Filled 2018-12-08 (×24): qty 2

## 2018-12-08 NOTE — Evaluation (Addendum)
Physical Therapy Evaluation Patient Details Name: Becky Figueroa MRN: [id number] DOB: 05-15-1946 Today's Date: 12/08/2018   History of Present Illness  Becky Figueroa is an 73 y.o. female past medical history of Parkinson's disease, dementia, essential hypertension, admitted with Septic shock due to sacral osteomyelitis, unstageable sacral pressure injury   Clinical Impression  .Pt admitted with above diagnosis. Pt currently with functional limitations due to the deficits listed below (see PT Problem List).  Pt with significant deconditioning and reluctant to move at all, requires maximum encouragement.Sat EOB x 9 minutes requiring min assist for static/dynamic balance activities; will need SNF post acute. Will follow in acute setting. VSS during PT/OT session. Pt may benefit from Palliative Care Consult to determine goals of care.  Pt will benefit from skilled PT to increase their independence and safety with mobility to allow discharge to the venue listed below.     Follow Up Recommendations SNF    Equipment Recommendations  None recommended by PT    Recommendations for Other Services       Precautions / Restrictions Precautions Precautions: Fall Precaution Comments: incontinent Restrictions Weight Bearing Restrictions: No      Mobility  Bed Mobility Overal bed mobility: Needs Assistance       Supine to sit: and sit to supine +2 for safety/equipment;+2 for physical assistance;Max assist     General bed mobility comments: assist for trunk, LEs in both directions;  incr time needed and maximium encouragement/multi-modal cues   Transfers                 General transfer comment: NT/unable   Ambulation/Gait             General Gait Details: NT/unable  Stairs            Wheelchair Mobility    Modified Rankin (Stroke Patients Only)       Balance Overall balance assessment: Needs assistance Sitting-balance support: Feet  supported Sitting balance-Leahy Scale: Poor Sitting balance - Comments: assist to maintain midline Postural control: Posterior lean     Standing balance comment: NT/unable                             Pertinent Vitals/Pain Pain Assessment: Faces Faces Pain Scale: Hurts whole lot Pain Location: c/o diffuse pain everywhere Pain Descriptors / Indicators: Discomfort;Grimacing    Home Living Family/patient expects to be discharged to:: (pt was at [facility] since last admission)                      Prior Function                 Hand Dominance        Extremity/Trunk Assessment        Lower Extremity Assessment Lower Extremity Assessment: Generalized weakness;RLE deficits/detail;LLE deficits/detail RLE Deficits / Details: AAROM/PROM limited by pain, ankles to neutral LLE Deficits / Details: same as above        Communication      Cognition Arousal/Alertness: Awake/alert Behavior During Therapy: WFL for tasks assessed/performed Overall Cognitive Status: History of cognitive impairments - at baseline                                 General Comments: oriented to self, place, her son      General Comments      Exercises  Assessment/Plan    PT Assessment Patient needs continued PT services  PT Problem List Decreased strength;Decreased activity tolerance;Decreased balance;Decreased mobility;Decreased knowledge of precautions;Decreased safety awareness;Decreased knowledge of use of DME;Decreased cognition;Pain       PT Treatment Interventions DME instruction;Gait training;Functional mobility training;Therapeutic activities;Patient/family education;Cognitive remediation;Therapeutic exercise;Balance training    PT Goals (Current goals can be found in the Care Plan section)       Frequency Min 2X/week   Barriers to discharge        Co-evaluation PT/OT/SLP Co-Evaluation/Treatment: Yes Reason for Co-Treatment: For  patient/therapist safety PT goals addressed during session: Mobility/safety with mobility         AM-PAC PT "6 Clicks" Mobility  Outcome Measure Help needed turning from your back to your side while in a flat bed without using bedrails?: Total Help needed moving from lying on your back to sitting on the side of a flat bed without using bedrails?: Total Help needed moving to and from a bed to a chair (including a wheelchair)?: Total Help needed standing up from a chair using your arms (e.g., wheelchair or bedside chair)?: Total Help needed to walk in hospital room?: Total   6 Click Score: 5    End of Session   Activity Tolerance: Patient limited by fatigue;Patient limited by pain;Other (comment)(self limiting) Patient left: with call bell/phone within reach;with family/visitor present;in bed;with bed alarm set Nurse Communication: Mobility status PT Visit Diagnosis: Unsteadiness on feet (R26.81);Difficulty in walking, not elsewhere classified (R26.2)    Time: 6314-9702 PT Time Calculation (min) (ACUTE ONLY): 26 min   Charges:   PT Evaluation $PT Eval Moderate Complexity: 1 Mod          Drucilla Chalet, PT  Pager: [phone] Acute Rehab Dept [facility]): [phone]   12/08/2018   [facility] 12/08/2018, 1:28 PM

## 2018-12-08 NOTE — Progress Notes (Signed)
Patient ID: Becky Figueroa, female   DOB: [birth year], 73 y.o.   MRN: [id number]          [facility]    Date of Admission:  11/26/2018   Total days of antibiotics 13        Day 9 piperacillin tazobactam        Day 4 vancomycin  [name] seems to be improving slowly on piperacillin tazobactam for her sacral wound infection and osteomyelitis.  Recommend 6 weeks of total therapy.  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  She remains afebrile and repeat blood cultures were negative before vancomycin was started.  I will continue vancomycin 1 more day.        Cliffton Asters, MD [facility] [phone] pager   [phone] cell 12/08/2018, 5:13 PM

## 2018-12-08 NOTE — Progress Notes (Signed)
Pharmacy Antibiotic Note  Becky Figueroa is a 73 y.o. female admitted on 11/26/2018 with septic shock d/t large decubitus ulcer and osteomyelitis. Started on Zosyn but with failure to improve, ID restarting vancomycin with Pharmacy to dose.  Plan:  Continue Zosyn 3.375g IV Q8H infused over 4hrs.  Continue Vancomycin 1250 mg IV Q 24 hrs. Goal AUC 400-550.  Expected AUC: 494 using rounded SCr 1  Follow up renal function, culture results, and clinical course.   Height: 5\' 6"  (167.6 cm) Weight: 176 lb 9.4 oz (80.1 kg) IBW/kg (Calculated) : 59.3  Temp (24hrs), Avg:97.8 F (36.6 C), Min:97.4 F (36.3 C), Max:98.5 F (36.9 C)  Recent Labs  Lab 12/04/18 0358 12/05/18 0500 12/06/18 0247 12/07/18 0338 12/08/18 0326  WBC 6.6 4.5 3.1* 2.9* 3.2*  CREATININE 0.45 0.45 0.44 0.43* 0.44    Estimated Creatinine Clearance: 67.8 mL/min (by C-G formula based on SCr of 0.44 mg/dL).    Allergies  Allergen Reactions  . Sinemet [Carbidopa W-Levodopa] Nausea And Vomiting    Able to tolerate with Zofran  . Sulfa Antibiotics Other (See Comments)    Flushing and hot    Antimicrobials this admission:  PTA cipro 1/28 >> (7 days Rx) 1/29 Vancomycin >> 2/1; resume 2/7 >> 1/29 Cefepime >> 2/2 2/2 Zosyn >>   Dose adjustments this admission:  1/31 Vancomycin, Cefepime empirically adjusted for renal function  Microbiology results:  1/23 Sacral wound Cx: E coli R-Amp, Unasyn, Ancef 1/29 BCx: NGF 1/30 MRSA PCR: neg 2/4 BCx: updated to 2/4 bottles (aerobic) Staph Epi (Sens: gent, tetracycline, vanc, clinda)     2/6 BCID:  MR Staph sp 2/4 UCx: NGF 2/7 rpt BCx: ngtd  Thank you for allowing pharmacy to be a part of this patient's care.  Lynann Beaver PharmD, BCPS Pager [phone] 12/08/2018 12:40 PM

## 2018-12-08 NOTE — Progress Notes (Signed)
Transferred to room 1227 via bed.
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
## 2018-12-08 NOTE — Progress Notes (Signed)
  Speech Language Pathology Treatment: Dysphagia  Patient Details Name: Becky Figueroa MRN: [id number] DOB: 1946/08/10 Today's Date: 12/08/2018 Time: 1414-1430 SLP Time Calculation (min) (ACUTE ONLY): 16 min  Assessment / Plan / Recommendation Clinical Impression  Pt familiar to this SLP from prior hospital admission.  Today she clinically appears improved.  3 ounce water test given but pt required rest break in between however she did not demonstrate indication of aspiration.  Excellent management of solids requiring mastication noted without residuals nor indications of pharyngeal residuals.  Pt reports her swallow ability is at baseline.  Spoke to son who reports pt was coughing "a lot" yesterday - ? during intake - and advised today much improved.  Son states pt is eating much better.    Will follow up for tolerance of advancement and indication of instrumental evaluation - at this time instrumental evaluation deemed not indicated.  Pt and son express understanding to information provided.  Dys3 diet recommended for energy conservation, and to accommodate poor dentition and positoning difficulties.     HPI HPI: 73 y.o.femalewith past medical history significant for dementia with Parkinson's disease, GERD, hiatal hernia, depression, decubitus ulcer admitted from wound center for suspicion of infected wound and worseningmental status.RN requests swallow assessment for advancement of diet from puree texture. BSE 11/03/18 (+) s/s aspiration with thin via straw and 3 oz water test; regular and thin liquids recommended. RN stated pt was thought to have aspirated. CXR Similar appearance to the study of 2 days ago. Probable mild pulmonary edema, possibly with associated scattered infiltrates. Small right effusion.      SLP Plan  Continue with current plan of care       Recommendations  Diet recommendations: Dysphagia 3 (mechanical soft);Thin liquid(for energy conservation, poor dentition  and positoning difficulties) Liquids provided via: Cup;Straw Medication Administration: Whole meds with puree Supervision: Full supervision/cueing for compensatory strategies;Staff to assist with self feeding Compensations: Slow rate;Small sips/bites;Minimize environmental distractions Postural Changes and/or Swallow Maneuvers: Seated upright 90 degrees;Upright 30-60 min after meal(as upright as able)                Oral Care Recommendations: Oral care BID Follow up Recommendations: Skilled Nursing facility SLP Visit Diagnosis: Dysphagia, unspecified (R13.10) Plan: Continue with current plan of care       GO                Chales Abrahams 12/08/2018, 3:34 PM  Donavan Burnet, MS [facility] SLP Acute Rehab Services Pager [phone] Office [phone]

## 2018-12-08 NOTE — Evaluation (Addendum)
Occupational Therapy Evaluation Patient Details Name: Becky Figueroa MRN: [id number] DOB: [birth year] Today's Date: 12/08/2018    History of Present Illness Becky Figueroa is an 73 y.o. female past medical history of Parkinson's disease, dementia, essential hypertension, admitted with Septic shock due to sacral osteomyelitis, unstageable sacral pressure injury    Clinical Impression   This 73 y/o female presents with the above. Pt was most recently receiving therapy services in SNF setting PTA, required assist for bathing/dressing ADL. Pt presenting with pain, weakness, decreased sitting balance and functional performance. She currently requires two person assist for safe completion of bed mobility, requires min-modA for sitting balance EOB; pt requiring minA for seated grooming ADL, totalA for LB ADL. Pt requiring encouragement to participate initially this session. She will benefit from continued acute OT services and recommend follow up therapy services in SNF setting after discharge to maximize her safety and independence with ADL and mobility. Will follow.     Follow Up Recommendations  SNF;Supervision/Assistance - 24 hour    Equipment Recommendations  Other (comment)(defer to next venue)           Precautions / Restrictions Precautions Precautions: Fall Precaution Comments: incontinent; sacral wound Restrictions Weight Bearing Restrictions: No      Mobility Bed Mobility Overal bed mobility: Needs Assistance Bed Mobility: Supine to Sit;Sit to Supine     Supine to sit: +2 for safety/equipment;+2 for physical assistance;Max assist Sit to supine: Max assist;+2 for physical assistance;+2 for safety/equipment   General bed mobility comments: assist for trunk, LEs in both directions;  incr time needed and maximium encouragement/multi-modal cues   Transfers                 General transfer comment: NT/unable     Balance Overall balance assessment: Needs  assistance Sitting-balance support: Feet supported Sitting balance-Leahy Scale: Poor Sitting balance - Comments: assist to maintain midline Postural control: Posterior lean     Standing balance comment: NT/unable                           ADL either performed or assessed with clinical judgement   ADL Overall ADL's : Needs assistance/impaired Eating/Feeding: Set up;Sitting   Grooming: Minimal assistance;Sitting;Wash/dry face;Brushing hair;Moderate assistance Grooming Details (indicate cue type and reason): assist to bring UE towards face for facewashing task, encouragement to perform task; assist for thoroughness with combing hair Upper Body Bathing: Moderate assistance;Sitting       Upper Body Dressing : Minimal assistance;Moderate assistance;Sitting   Lower Body Dressing: Total assistance;Bed level Lower Body Dressing Details (indicate cue type and reason): assist to don socks bed level               General ADL Comments: pt able to tolerate sitting EOB with +2 assist today; limited due to pain and weakness; requires encouragement     Vision         Perception     Praxis      Pertinent Vitals/Pain Pain Assessment: Faces Faces Pain Scale: Hurts whole lot Pain Location: c/o diffuse pain everywhere Pain Descriptors / Indicators: Discomfort;Grimacing Pain Intervention(s): Limited activity within patient's tolerance;Monitored during session;Repositioned     Hand Dominance     Extremity/Trunk Assessment Upper Extremity Assessment Upper Extremity Assessment: Generalized weakness RUE Deficits / Details: grossly 2/5 strength LUE Deficits / Details: grossly 2/5 strength    Lower Extremity Assessment Lower Extremity Assessment: Defer to PT evaluation RLE Deficits / Details: AAROM/PROM  limited by pain, ankles to neutral LLE Deficits / Details: same as above        Communication Communication Communication: No difficulties   Cognition  Arousal/Alertness: Awake/alert Behavior During Therapy: WFL for tasks assessed/performed Overall Cognitive Status: History of cognitive impairments - at baseline                                 General Comments: oriented to self, place, her son   General Comments  VSS    Exercises     Shoulder Instructions      Home Living Family/patient expects to be discharged to:: Skilled nursing facility(pt was at [facility] since previous admission)                                        Prior Functioning/Environment Level of Independence: Needs assistance    ADL's / Homemaking Assistance Needed: most recently receiving assist for bathing/dressing            OT Problem List: Decreased strength;Decreased range of motion;Decreased activity tolerance;Impaired balance (sitting and/or standing);Pain      OT Treatment/Interventions: Self-care/ADL training;Therapeutic exercise;Neuromuscular education;Energy conservation;DME and/or AE instruction;Therapeutic activities;Patient/family education;Balance training    OT Goals(Current goals can be found in the care plan section) Acute Rehab OT Goals Patient Stated Goal: to get better OT Goal Formulation: With patient Time For Goal Achievement: 12/22/18 Potential to Achieve Goals: Good  OT Frequency: Min 2X/week   Barriers to D/C:            Co-evaluation PT/OT/SLP Co-Evaluation/Treatment: Yes Reason for Co-Treatment: For patient/therapist safety;To address functional/ADL transfers PT goals addressed during session: Mobility/safety with mobility OT goals addressed during session: Strengthening/ROM;ADL's and self-care      AM-PAC OT "6 Clicks" Daily Activity     Outcome Measure Help from another person eating meals?: A Little Help from another person taking care of personal grooming?: A Little Help from another person toileting, which includes using toliet, bedpan, or urinal?: Total Help from another  person bathing (including washing, rinsing, drying)?: Total Help from another person to put on and taking off regular upper body clothing?: A Lot Help from another person to put on and taking off regular lower body clothing?: Total 6 Click Score: 11   End of Session Equipment Utilized During Treatment: Oxygen Nurse Communication: Mobility status  Activity Tolerance: Patient tolerated treatment well;Patient limited by pain Patient left: in bed;with call bell/phone within reach;with bed alarm set;with family/visitor present  OT Visit Diagnosis: Muscle weakness (generalized) (M62.81);Other abnormalities of gait and mobility (R26.89)                Time: 6734-1937 OT Time Calculation (min): 26 min Charges:  OT General Charges $OT Visit: 1 Visit OT Evaluation $OT Eval Moderate Complexity: 1 Mod  Marcy Siren, OT [employer] Pager [phone] Office [phone]   Orlando Penner 12/08/2018, 2:16 PM

## 2018-12-08 NOTE — Care Management Note (Signed)
Case Management Note  Patient Details  Name: JAQUANDA PIROLLI MRN: [id number] Date of Birth: 1946-04-22  Subjective/Objective:                  Discharge Readiness Return to top of Pressure Ulcer Closure by Musculocutaneous or Free Flap: Sacral, Ischial, or Trochanteric Region RRG - ISC  Discharge readiness is indicated by patient meeting Recovery Milestones, including ALL of the following: ? Hemodynamic stabilityHAS ARTERIAL LINE IN DUE TO HYPOTENSION ? Pain absent or managed MANAGED ? No evidence of postoperative or surgical site infection DECUBITS WITH NECROSIS  ? Acceptable flap healing NOT AT THIS TIMER ? Drains absent, or management at next level of care arranged DAILY DRESSING CHANGES ? Oral hydration, medication, and diet ? IV NEO DRIP, IV ZOSYN, IV VANCOMYCIN ? LEVEL OF CARE -ICU DUE TO HYPOTENSIVE STATE   Action/Plan: Will follow for progression of care and clinical status. Will follow for case management needs none present at this time.  Expected Discharge Date:  (unknown)               Expected Discharge Plan:     In-House Referral:     Discharge planning Services     Post Acute Care Choice:    Choice offered to:     DME Arranged:    DME Agency:     HH Arranged:    HH Agency:     Status of Service:     If discussed at [employer], dates discussed:    Additional Comments:  Golda Acre, RN 12/08/2018, 10:56 AM

## 2018-12-08 NOTE — Progress Notes (Signed)
[facility] Progress Note  6 Days Post-Op  Subjective: CC: wound Patient reports pain with dressing changes, otherwise pain is not bad. Dressing changes once daily. No family at bedside this AM.   Objective: Vital signs in last 24 hours: Temp:  [97.4 F (36.3 C)-98.5 F (36.9 C)] 97.5 F (36.4 C) (02/10 0400) Pulse Rate:  [70-89] 70 (02/10 0635) Resp:  [17-25] 17 (02/10 0635) SpO2:  [88 %-93 %] 91 % (02/10 0635) Arterial Line BP: (99-134)/(39-61) 108/43 (02/10 0635) Last BM Date: 12/05/18  Intake/Output from previous day: 02/09 0701 - 02/10 0700 In: 834.6 [P.O.:340; I.V.:110; IV Piggyback:384.6] Out: 3480 [Urine:3480] Intake/Output this shift: No intake/output data recorded.  PE: Gen:  Alert, NAD, pleasant Card:  Regular rate and rhythm Pulm:  Normal effort Abd: Soft, non-tender, non-distended GU: wound as below, clean with small amount of necrosis at base     Lab Results:  Recent Labs    12/07/18 0338 12/08/18 0326  WBC 2.9* 3.2*  HGB 8.6* 8.8*  HCT 27.3* 28.4*  PLT 124* 134*   BMET Recent Labs    12/07/18 0338 12/08/18 0326  NA 142 142  K 2.1* 2.3*  CL 101 97*  CO2 34* 38*  GLUCOSE 143* 138*  BUN 10 11  CREATININE 0.43* 0.44  CALCIUM 7.3* 7.2*   PT/INR No results for input(s): LABPROT, INR in the last 72 hours. CMP     Component Value Date/Time   NA 142 12/08/2018 0326   K 2.3 (LL) 12/08/2018 0326   CL 97 (L) 12/08/2018 0326   CO2 38 (H) 12/08/2018 0326   GLUCOSE 138 (H) 12/08/2018 0326   BUN 11 12/08/2018 0326   CREATININE 0.44 12/08/2018 0326   CALCIUM 7.2 (L) 12/08/2018 0326   PROT 5.0 (L) 12/04/2018 0358   ALBUMIN 3.0 (L) 12/04/2018 0358   AST 24 12/04/2018 0358   ALT 20 12/04/2018 0358   ALKPHOS 102 12/04/2018 0358   BILITOT 0.4 12/04/2018 0358   GFRNONAA >60 12/08/2018 0326   GFRAA >60 12/08/2018 0326   Lipase  No results found for: LIPASE     Studies/Results: No results  found.  Anti-infectives: Anti-infectives (From admission, onward)   Start     Dose/Rate Route Frequency Ordered Stop   12/05/18 2200  vancomycin (VANCOCIN) 1,250 mg in sodium chloride 0.9 % 250 mL IVPB     1,250 mg 166.7 mL/hr over 90 Minutes Intravenous Every 24 hours 12/05/18 1035     12/05/18 0915  vancomycin (VANCOCIN) IVPB 1000 mg/200 mL premix     1,000 mg 200 mL/hr over 60 Minutes Intravenous  Once 12/05/18 0906 12/05/18 1053   11/30/18 1200  piperacillin-tazobactam (ZOSYN) IVPB 3.375 g     3.375 g 12.5 mL/hr over 240 Minutes Intravenous Every 8 hours 11/30/18 1133     11/29/18 1200  vancomycin (VANCOCIN) IVPB 1000 mg/200 mL premix  Status:  Discontinued     1,000 mg 200 mL/hr over 60 Minutes Intravenous Every 24 hours 11/28/18 1304 11/29/18 0913   11/28/18 1800  ceFEPIme (MAXIPIME) 2 g in sodium chloride 0.9 % 100 mL IVPB  Status:  Discontinued     2 g 200 mL/hr over 30 Minutes Intravenous Every 12 hours 11/28/18 1304 11/30/18 1117   11/28/18 1200  vancomycin (VANCOCIN) 1,250 mg in sodium chloride 0.9 % 250 mL IVPB  Status:  Discontinued     1,250 mg 166.7 mL/hr over 90 Minutes Intravenous Every 48 hours 11/26/18 1715 11/28/18 1304   11/27/18  0600  ceFEPIme (MAXIPIME) 1 g in sodium chloride 0.9 % 100 mL IVPB  Status:  Discontinued     1 g 200 mL/hr over 30 Minutes Intravenous Every 12 hours 11/26/18 1715 11/28/18 1304   11/26/18 1430  ceFEPIme (MAXIPIME) 2 g in sodium chloride 0.9 % 100 mL IVPB     2 g 200 mL/hr over 30 Minutes Intravenous STAT 11/26/18 1429 11/26/18 1604   11/26/18 1430  vancomycin (VANCOCIN) IVPB 1000 mg/200 mL premix     1,000 mg 200 mL/hr over 60 Minutes Intravenous STAT 11/26/18 1429 11/26/18 1621       Assessment/Plan Parkinson's disease Dementia Anxiety/depression Malnutrition H/o RLE DVT - eliquis restarted 2/9 Septic shock/encephalopathy Hypokalemia - K 2.3 this AM, replacement per primary team   Sacral decubitus ulcerwith  osteomyelitis S/pEXCISIONALDEBRIDEMENT SACRAL DECUBITUS2/4 Dr. Michaell CowingGross - POD 6 - daily wet to dry dressing changes - will discuss with MD, may benefit from hydrotherapy   ID -maxipime/vancomycin 1/29>>2/2, zosyn 2/2>>, vanc 2/7>> VTE -SCDs, eliquis FEN -Dysphagia 1 diet Foley - in place  Plan - Continue dressing changes, minimize pressure to wound. May start hydrotherapy tomorrow but will discuss with MD.   LOS: 12 days    Wells GuilesKelly Rayburn , [facility] 12/08/2018, 7:51 AM Pager: [phone] Consults: [phone]

## 2018-12-08 NOTE — Progress Notes (Signed)
CRITICAL VALUE ALERT  Critical Value:  K+ 2.3  Date & Time Notied:  12/08/2018 0627  Provider Notified: Bruna Potter  Orders Received/Actions taken: awaiting new orders

## 2018-12-09 DIAGNOSIS — E440 Moderate protein-calorie malnutrition: Secondary | ICD-10-CM

## 2018-12-09 DIAGNOSIS — Z6828 Body mass index (BMI) 28.0-28.9, adult: Secondary | ICD-10-CM

## 2018-12-09 DIAGNOSIS — L89159 Pressure ulcer of sacral region, unspecified stage: Secondary | ICD-10-CM

## 2018-12-09 LAB — BASIC METABOLIC PANEL
Anion gap: 10 (ref 5–15)
BUN: 11 mg/dL (ref 8–23)
CHLORIDE: 94 mmol/L — AB (ref 98–111)
CO2: 38 mmol/L — ABNORMAL HIGH (ref 22–32)
Calcium: 7.5 mg/dL — ABNORMAL LOW (ref 8.9–10.3)
Creatinine, Ser: 0.47 mg/dL (ref 0.44–1.00)
GFR calc Af Amer: >60 mL/min (ref >60)
GFR calc non Af Amer: >60 mL/min (ref >60)
Glucose, Bld: 147 mg/dL — ABNORMAL HIGH (ref 70–99)
Potassium: 2.9 mmol/L — ABNORMAL LOW (ref 3.5–5.1)
Sodium: 142 mmol/L (ref 135–145)

## 2018-12-09 LAB — MAGNESIUM: Magnesium: 1.8 mg/dL (ref 1.7–2.4)

## 2018-12-09 LAB — CBC
HCT: 30 % — ABNORMAL LOW (ref 36.0–46.0)
HEMOGLOBIN: 9.1 g/dL — AB (ref 12.0–15.0)
MCH: 30.5 pg (ref 26.0–34.0)
MCHC: 30.3 g/dL (ref 30.0–36.0)
MCV: 100.7 fL — AB (ref 80.0–100.0)
Platelets: 121 10*3/uL — ABNORMAL LOW (ref 150–400)
RBC: 2.98 MIL/uL — ABNORMAL LOW (ref 3.87–5.11)
RDW: 15.7 % — ABNORMAL HIGH (ref 11.5–15.5)
WBC: 4.5 10*3/uL (ref 4.0–10.5)
nRBC: 0 % (ref 0.0–0.2)

## 2018-12-09 MED ORDER — MAGNESIUM SULFATE 2 GM/50ML IV SOLN
2.0000 g | Freq: Once | INTRAVENOUS | Status: AC
  Administered 2018-12-09: 2 g via INTRAVENOUS
  Filled 2018-12-09: qty 50

## 2018-12-09 MED ORDER — MIDODRINE HCL 2.5 MG PO TABS
2.5000 mg | ORAL_TABLET | Freq: Three times a day (TID) | ORAL | Status: DC
  Administered 2018-12-09 – 2018-12-16 (×20): 2.5 mg via ORAL
  Filled 2018-12-09 (×22): qty 1

## 2018-12-09 NOTE — Progress Notes (Signed)
PT HYDROTHERAPY EVAL AND TX  12/09/18 1500  Subjective Assessment  Subjective please don't hurt me  Patient and Family Stated Goals Pt unable to state, hx of dementia  Date of Onset  (present on admission)  Prior Treatments wound center, s/p bedside debridement 11/27/2018 and surgical deb  this admission  Evaluation and Treatment  Evaluation and Treatment Procedures Explained to Patient/Family Yes  Evaluation and Treatment Procedures Patient unable to consent due to mental status  Pressure Injury 11/28/18 Unstageable - Full thickness tissue loss in which the base of the ulcer is covered by slough (yellow, tan, gray, green or brown) and/or eschar (tan, brown or black) in the wound bed. HYDROTHERAPY  Date First Assessed/Time First Assessed: 11/28/18 1330   Location: Sacrum  Location Orientation: Mid  Staging: Unstageable - Full thickness tissue loss in which the base of the ulcer is covered by slough (yellow, tan, gray, green or brown) and/or esch...  Dressing Type Moist to dry;Foam;Gauze (Comment)  Dressing Changed  Dressing Change Frequency Twice a day  State of Healing Early/partial granulation  Site / Wound Assessment Black;Granulation tissue;Red;Yellow  % Wound base Red or Granulating 80%  % Wound base Black/Eschar 5%  % Wound base Other/Granulation Tissue (Comment) 15% (bone, fascia/ligaments)  Peri-wound Assessment Maceration;Pink;Purple  Wound Length (cm) 8 cm  Wound Width (cm) 14 cm  Wound Depth (cm) 4.5 cm  Wound Surface Area (cm^2) 112 cm^2  Wound Volume (cm^3) 504 cm^3  Undermining (cm) 1.5 (undermining 10:00 to 12:00)  Margins Unattached edges (unapproximated)  Drainage Amount Copious  Drainage Description No odor;Serosanguineous  Treatment Cleansed;Debridement (Selective);Hydrotherapy (Pulse lavage);Packing (Saline gauze)  Hydrotherapy  Pulsed lavage therapy - wound location sacral pressure injury  Pulsed Lavage with Suction (psi) 4 psi  Pulsed Lavage with Suction -  Normal Saline Used 1000 mL  Pulsed Lavage Tip Tip with splash shield  Selective Debridement  Selective Debridement - Location sacral injury  Selective Debridement - Tools Used Scissors;Forceps  Selective Debridement - Tissue Removed yellow grey slough/nonviable tissue  Wound Therapy - Assess/Plan/Recommendations  Wound Therapy - Clinical Statement pt seen for follow up wound care and furthter bedside debridement  (s/p surgical debridement);  Pt wound with mostly viable granulation tissue at this time, copious exudate and  small amount of nonviable tissue present, feel pt will benefit from 1-2 more hydrotherapy visits and likely will be ready for d/c from our services at that time.   Wound Therapy - Functional Problem List limited mobility/grossly bedbound/painful with any all movment  Factors Delaying/Impairing Wound Healing Infection - systemic/local;Immobility;Multiple medical problems (sacral osteo)  Hydrotherapy Plan Debridement;Dressing change;Patient/family education  Wound Therapy - Frequency 6X / week  Wound Therapy - Follow Up Recommendations Skilled nursing facility  Wound Plan Will continue to assist with above plan to decrease necrotic nonviable tissue in this  pressure injury of sacrum  Wound Therapy Goals - Improve the function of patient's integumentary system by progressing the wound(s) through the phases of wound healing by:  Decrease Necrotic Tissue to 0  Decrease Necrotic Tissue - Progress Goal set today  Increase Granulation Tissue to 90  Increase Granulation Tissue - Progress Goal set today  Improve Drainage Characteristics Mod  Improve Drainage Characteristics - Progress Goal set today  Patient/Family will be able to  Family able to recall good repositioning techniques.  Patient/Family Instruction Goal - Progress Goal set today  Drucilla Chalet, PT  Pager: [phone] Acute Rehab Dept [facility]): [phone]   12/09/2018

## 2018-12-09 NOTE — Progress Notes (Signed)
Patient ID: Becky Mountainatricia P XXXCaviness, female   DOB: 1946-03-11, 73 y.o.   MRN: [id number]         [facility]  Date of Admission:  11/26/2018   Total days of antibiotics 14        Day 10 piperacillin tazobactam        Day 5 vancomycin         ASSESSMENT: Her sacral decubitus wound is looking much better but she still has a major uphill battle to recover from her sacral osteomyelitis and heal that wound given her debilitated state and malnutrition.  I recommend at least 4 more weeks of piperacillin tazobactam.  Blood cultures last week grew methicillin-resistant coag negative staph but repeat cultures were negative prior to institution of vancomycin.  I will stop vancomycin today.  PLAN: 1. Continue piperacillin tazobactam for 4 more weeks through 01/06/2019 2. Discontinue vancomycin 3. 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 apixaban  5 mg Oral BID  . carbidopa-levodopa  0.5 tablet Oral TID WC  . Chlorhexidine Gluconate Cloth  6 each Topical Daily  . feeding supplement (ENSURE ENLIVE)  237 mL Oral BID BM  . furosemide  40 mg Intravenous BID  . hydrocortisone sodium succinate  50 mg Intravenous Q6H  . mouth rinse  15 mL Mouth Rinse BID  . potassium chloride  40 mEq Oral TID  . sodium chloride flush  10-40 mL Intracatheter Q12H   Continuous Infusions: . sodium chloride Stopped (11/29/18 1737)  . magnesium sulfate 1 - 4 g bolus IVPB 2 g (12/09/18 1048)  . piperacillin-tazobactam (ZOSYN)  IV 3.375 g (12/09/18 11910611)  . vancomycin Stopped (12/08/18 2307)   PRN Meds:.acetaminophen  **OR** acetaminophen, ALPRAZolam, fentaNYL (SUBLIMAZE) injection, morphine injection, ondansetron **OR** ondansetron (ZOFRAN) IV, oxyCODONE-acetaminophen, silver nitrate applicators, sodium chloride flush, traMADol   SUBJECTIVE: She says that she is feeling a little better.  Review of Systems: Review of Systems  Unable to perform ROS: Medical condition    Allergies  Allergen Reactions  . Sinemet [Carbidopa W-Levodopa] Nausea And Vomiting    Able to tolerate with Zofran  . Sulfa Antibiotics Other (See Comments)    Flushing and hot    OBJECTIVE: Vitals:   12/09/18 0600 12/09/18 0606 12/09/18 0703 12/09/18 1007  BP: (!) 110/53     Pulse: (!) 156     Resp: (!) 22     Temp:   98.2 F (36.8 C)   TempSrc:   Axillary   SpO2: 90% 92%  92%  Weight:      Height:       Body mass index is 28.5 kg/m.  Physical Exam Constitutional:      Comments: She still appears very weak.  She is very pleasant resting quietly in bed.  Skin:    Comments: PICC site looks good.    Photo taken on admission    Photo yesterday  Lab Results Lab Results  Component Value Date   WBC 4.5 12/09/2018   HGB 9.1 (L) 12/09/2018   HCT 30.0 (L) 12/09/2018   MCV 100.7 (H) 12/09/2018   PLT  121 (L) 12/09/2018    Lab Results  Component Value Date   CREATININE 0.47 12/09/2018   BUN 11 12/09/2018   NA 142 12/09/2018   K 2.9 (L) 12/09/2018   CL 94 (L) 12/09/2018   CO2 38 (H) 12/09/2018    Lab Results  Component Value Date   ALT 20 12/04/2018   AST 24 12/04/2018   ALKPHOS 102 12/04/2018   BILITOT 0.4 12/04/2018     Microbiology: Recent Results (from the past 240 hour(s))  Culture, blood (routine x 2)     Status: Abnormal   Collection Time: 12/02/18  9:45 AM  Result Value Ref Range Status   Specimen Description   Final    BLOOD LEFT HAND Performed at [facility], [address], [place], [place] [address]    Special Requests   Final    BOTTLES DRAWN AEROBIC ONLY Blood  Culture results may not be optimal due to an inadequate volume of blood received in culture bottles Performed at [facility], [address] [place], [place] [address]    Culture  Setup Time   Final    GRAM POSITIVE COCCI IN CLUSTERS AEROBIC BOTTLE ONLY CRITICAL VALUE NOTED.  VALUE IS CONSISTENT WITH PREVIOUSLY REPORTED AND CALLED VALUE.    Culture (A)  Final    STAPHYLOCOCCUS EPIDERMIDIS SUSCEPTIBILITIES PERFORMED ON PREVIOUS CULTURE WITHIN THE LAST 5 DAYS. Performed at [facility], [address] [place], [place] [address]    Report Status 12/06/2018 FINAL  Final  Culture, blood (routine x 2)     Status: Abnormal   Collection Time: 12/02/18  9:53 AM  Result Value Ref Range Status   Specimen Description   Final    BLOOD LEFT HAND Performed at [facility], [address], [place], [place] [address]    Special Requests   Final    BOTTLES DRAWN AEROBIC ONLY Blood Culture results may not be optimal due to an inadequate volume of blood received in culture bottles Performed at [facility], [address] [place], [place] [address]    Culture  Setup Time   Final    GRAM POSITIVE COCCI AEROBIC BOTTLE ONLY CRITICAL RESULT CALLED TO, READ BACK BY AND VERIFIED WITH: Thana Ates [id number] MLM Performed at [facility], [address] [place], [place] [address]    Culture STAPHYLOCOCCUS EPIDERMIDIS (A)  Final   Report Status 12/06/2018 FINAL  Final   Organism ID, Bacteria STAPHYLOCOCCUS EPIDERMIDIS  Final      Susceptibility   Staphylococcus epidermidis - MIC*    CIPROFLOXACIN >=8 RESISTANT Resistant     ERYTHROMYCIN >=8 RESISTANT Resistant     GENTAMICIN <=0.5 SENSITIVE Sensitive     OXACILLIN >=4 RESISTANT Resistant     TETRACYCLINE 2 SENSITIVE Sensitive     VANCOMYCIN 1 SENSITIVE Sensitive     TRIMETH/SULFA 80 RESISTANT Resistant     CLINDAMYCIN >=8 RESISTANT Resistant     RIFAMPIN <=0.5 SENSITIVE  Sensitive     Inducible Clindamycin NEGATIVE Sensitive     * STAPHYLOCOCCUS EPIDERMIDIS  Blood Culture ID Panel (Reflexed)     Status: Abnormal   Collection Time: 12/02/18  9:53 AM  Result Value Ref Range Status   Enterococcus species NOT DETECTED NOT DETECTED Final   Listeria monocytogenes NOT DETECTED NOT DETECTED Final   Staphylococcus species DETECTED (A) NOT DETECTED Final    Comment: Methicillin (oxacillin) resistant coagulase negative staphylococcus. Possible blood culture  contaminant (unless isolated from more than one blood culture draw or clinical case suggests pathogenicity). No antibiotic treatment is indicated for blood  culture contaminants. CRITICAL RESULT CALLED TO, READ BACK BY AND VERIFIED WITH: PHARMD J GADHIA [id number] MLM    Staphylococcus aureus (BCID) NOT DETECTED NOT DETECTED Final   Methicillin resistance DETECTED (A) NOT DETECTED Final    Comment: CRITICAL RESULT CALLED TO, READ BACK BY AND VERIFIED WITH: PHARMD J GADHIA [id number] MLM    Streptococcus species NOT DETECTED NOT DETECTED Final   Streptococcus agalactiae NOT DETECTED NOT DETECTED Final   Streptococcus pneumoniae NOT DETECTED NOT DETECTED Final   Streptococcus pyogenes NOT DETECTED NOT DETECTED Final   Acinetobacter baumannii NOT DETECTED NOT DETECTED Final   Enterobacteriaceae species NOT DETECTED NOT DETECTED Final   Enterobacter cloacae complex NOT DETECTED NOT DETECTED Final   Escherichia coli NOT DETECTED NOT DETECTED Final   Klebsiella oxytoca NOT DETECTED NOT DETECTED Final   Klebsiella pneumoniae NOT DETECTED NOT DETECTED Final   Proteus species NOT DETECTED NOT DETECTED Final   Serratia marcescens NOT DETECTED NOT DETECTED Final   Haemophilus influenzae NOT DETECTED NOT DETECTED Final   Neisseria meningitidis NOT DETECTED NOT DETECTED Final   Pseudomonas aeruginosa NOT DETECTED NOT DETECTED Final   Candida albicans NOT DETECTED NOT DETECTED Final   Candida glabrata NOT DETECTED NOT  DETECTED Final   Candida krusei NOT DETECTED NOT DETECTED Final   Candida parapsilosis NOT DETECTED NOT DETECTED Final   Candida tropicalis NOT DETECTED NOT DETECTED Final    Comment: Performed at [facility], [address], [place], [place] [address]  Culture, Urine     Status: None   Collection Time: 12/02/18  6:23 PM  Result Value Ref Range Status   Specimen Description   Final    URINE, CLEAN CATCH Performed at [facility], [address] [place], [place] [address]    Special Requests   Final    NONE Performed at [facility], [address] [place], [place] [address]    Culture   Final    NO GROWTH Performed at [facility], [address], [place], [place] [address]    Report Status 12/04/2018 FINAL  Final  Culture, blood (routine x 2)     Status: None (Preliminary result)   Collection Time: 12/05/18  2:15 PM  Result Value Ref Range Status   Specimen Description   Final    BLOOD LEFT HAND Performed at [facility], [address], [place], [place] [address]    Special Requests   Final    BOTTLES DRAWN AEROBIC ONLY Blood Culture adequate volume Performed at [facility], [address], [place], [place] [address]    Culture   Final    NO GROWTH 4 DAYS Performed at [facility], [address], [place], [place] [address]    Report Status PENDING  Incomplete  Culture, blood (routine x 2)     Status: None (Preliminary result)   Collection Time: 12/05/18  2:15 PM  Result Value Ref Range Status   Specimen Description   Final    BLOOD LEFT HAND Performed at [facility], [address], [place], [place] [address]    Special Requests   Final    BOTTLES DRAWN AEROBIC ONLY Blood Culture adequate volume Performed at [facility], [address] [place], [place] [address]    Culture  Final    NO GROWTH 4 DAYS Performed at [facility], [address] [place], [place] [address]    Report Status PENDING  Incomplete    Cliffton Asters, MD [facility] [phone] pager   [phone] cell 12/09/2018, 11:44 AM

## 2018-12-09 NOTE — Progress Notes (Signed)
Pharmacy Antibiotic Note  Becky Figueroa is a 73 y.o. female admitted on 11/26/2018 with septic shock d/t large decubitus ulcer and osteomyelitis. Pharmacy is consulted to dose Zosyn.    Plan:  Continue Zosyn 3.375g IV Q8H infused over 4hrs.  Dosage remains stable and need for further dosage adjustment appears unlikely at present.  Pharmacy will sign off at this time.  Please reconsult if a change in clinical status warrants re-evaluation of dosage.  Height: 5\' 6"  (167.6 cm) Weight: 176 lb 9.4 oz (80.1 kg) IBW/kg (Calculated) : 59.3  Temp (24hrs), Avg:98.1 F (36.7 C), Min:97.8 F (36.6 C), Max:98.6 F (37 C)  Recent Labs  Lab 12/05/18 0500 12/06/18 0247 12/07/18 0338 12/08/18 0326 12/09/18 0317  WBC 4.5 3.1* 2.9* 3.2* 4.5  CREATININE 0.45 0.44 0.43* 0.44 0.47    Estimated Creatinine Clearance: 67.8 mL/min (by C-G formula based on SCr of 0.47 mg/dL).    Allergies  Allergen Reactions  . Sinemet [Carbidopa W-Levodopa] Nausea And Vomiting    Able to tolerate with Zofran  . Sulfa Antibiotics Other (See Comments)    Flushing and hot    Antimicrobials this admission:  PTA cipro 1/28 >> (7 days Rx) 1/29 Vancomycin >> 2/1; resume 2/7 >>2/11 1/29 Cefepime >> 2/2 2/2 Zosyn >>   Dose adjustments this admission:  1/31 Vancomycin, Cefepime empirically adjusted for renal function  Microbiology results:  1/23 Sacral wound Cx: E coli R-Amp, Unasyn, Ancef 1/29 BCx: NGF 1/30 MRSA PCR: neg 2/4 BCx: updated to 2/4 bottles (aerobic) Staph Epi (Sens: gent, tetracycline, vanc, clinda)     2/6 BCID:  MR Staph sp 2/4 UCx: NGF 2/7 rpt BCx: ngtd  Thank you for allowing pharmacy to be a part of this patient's care.  Lynann Beaver PharmD, BCPS Pager [phone] 12/09/2018 12:13 PM
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
## 2018-12-10 DIAGNOSIS — R7881 Bacteremia: Secondary | ICD-10-CM

## 2018-12-10 LAB — CBC
HCT: 29.1 % — ABNORMAL LOW (ref 36.0–46.0)
Hemoglobin: 8.6 g/dL — ABNORMAL LOW (ref 12.0–15.0)
MCH: 29.8 pg (ref 26.0–34.0)
MCHC: 29.6 g/dL — ABNORMAL LOW (ref 30.0–36.0)
MCV: 100.7 fL — ABNORMAL HIGH (ref 80.0–100.0)
Platelets: 114 10*3/uL — ABNORMAL LOW (ref 150–400)
RBC: 2.89 MIL/uL — ABNORMAL LOW (ref 3.87–5.11)
RDW: 15.7 % — ABNORMAL HIGH (ref 11.5–15.5)
WBC: 3.4 10*3/uL — ABNORMAL LOW (ref 4.0–10.5)
nRBC: 0 % (ref 0.0–0.2)

## 2018-12-10 LAB — BASIC METABOLIC PANEL
Anion gap: 10 (ref 5–15)
BUN: 14 mg/dL (ref 8–23)
CHLORIDE: 91 mmol/L — AB (ref 98–111)
CO2: 41 mmol/L — ABNORMAL HIGH (ref 22–32)
CREATININE: 0.48 mg/dL (ref 0.44–1.00)
Calcium: 7.4 mg/dL — ABNORMAL LOW (ref 8.9–10.3)
GFR calc Af Amer: >60 mL/min (ref >60)
GFR calc non Af Amer: >60 mL/min (ref >60)
Glucose, Bld: 159 mg/dL — ABNORMAL HIGH (ref 70–99)
POTASSIUM: 3.1 mmol/L — AB (ref 3.5–5.1)
Sodium: 142 mmol/L (ref 135–145)

## 2018-12-10 LAB — CULTURE, BLOOD (ROUTINE X 2)
Culture: NO GROWTH
Culture: NO GROWTH
SPECIAL REQUESTS: ADEQUATE
Special Requests: ADEQUATE

## 2018-12-10 LAB — MAGNESIUM: Magnesium: 2 mg/dL (ref 1.7–2.4)

## 2018-12-10 MED ORDER — POTASSIUM CHLORIDE 10 MEQ/100ML IV SOLN
10.0000 meq | INTRAVENOUS | Status: AC
  Administered 2018-12-10 (×2): 10 meq via INTRAVENOUS
  Filled 2018-12-10 (×2): qty 100

## 2018-12-10 MED ORDER — JUVEN PO PACK
1.0000 | PACK | Freq: Two times a day (BID) | ORAL | Status: DC
  Administered 2018-12-10 – 2018-12-16 (×10): 1 via ORAL
  Filled 2018-12-10 (×14): qty 1

## 2018-12-10 NOTE — Progress Notes (Signed)
LCSW following for SNF placement.   Patient will dc to SNF when medically stable. Family has made bed choice.   Beulah Gandy Mick[place] Long CSW [phone]

## 2018-12-10 NOTE — Progress Notes (Addendum)
Nutrition Follow-up  DOCUMENTATION CODES:   Non-severe (moderate) malnutrition in context of acute illness/injury  INTERVENTION:  - Continue Ensure Enlive BID. - Will order Juven BID, each packet provides 80 caloriesand 14 grams of amino acids; supplement contains CaHMB, glutamine, and arginine, to promote wound healing. - Continue to encourage PO intakes.   NUTRITION DIAGNOSIS:   Moderate Malnutrition related to acute illness as evidenced by mild fat depletion, mild muscle depletion. -ongoing  GOAL:   Patient will meet greater than or equal to 90% of their needs -unmet but improving  MONITOR:   PO intake, Supplement acceptance, Weight trends, Labs, Skin, I & O's  ASSESSMENT:   73 y.o. female with past medical history significant for dementia with Parkinson's disease, depression, and decubitus ulcer. Son also reports recent dx of RLE DVT and that patient was started on anticoagulation. She was referred to the ED from the wound care center d/t worsening of her wound. Patient was noted to be very lethargic and confused in the ED.  Weight recorded as significantly up from 2/4-2/7 and then back down from 2/7-2/12; will continue to monitor closely. Patient sleeping during the time of visit. Son at bedside and provides information. He reports that patient is beginning to be able to drink without a straw and that over the past few days she has been able to feed herself for at least part of each meal.   He again reports that patient eats the best at breakfast and lunch and then only eats a small portion at dinner. Per review of flow sheet, she consumed 50% of breakfast and lunch on 2/9; 20% of breakfast, 30% of lunch, and 100% of dinner on 2/10; 80% of lunch on 2/11; 25% of breakfast this AM. Son reports she drinks 1-2 bottles of Ensure/day.   Plan is for d/c to SNF when medically ready. Per Dr. Raynelle Dick note yesterday afternoon, patient is volume overloaded with plan for ongoing diuresis to  reach goal of negative balance.   Son asks if current diet order is the one patient will remain on or if she will be changed to a regular diet. SLP saw patient on 2/10 and note indicates plan to monitor for when/if patient may be able to advance.    Medications reviewed; 40 mg IV lasix BID, 2 g IV Mg sulfate x1 run 2/11, 50 mg solu-cortef QID, 40 mEq K-Dur TID. Labs reviewed; K: 3.1 mmol/l, Cl: 91 mmol/l, Ca: 7.4 mg/dl.     Diet Order:   Diet Order            DIET DYS 3 Room service appropriate? Yes; Fluid consistency: Thin  Diet effective now              EDUCATION NEEDS:   No education needs have been identified at this time  Skin:  Skin Assessment: Skin Integrity Issues: Skin Integrity Issues:: DTI, Unstageable DTI: sacrum Unstageable: full thickness to sacrum  Last BM:  1/31  Height:   Ht Readings from Last 1 Encounters:  12/02/18 5\' 6"  (1.676 m)    Weight:   Wt Readings from Last 1 Encounters:  12/10/18 73.6 kg    Ideal Body Weight:  59.09 kg  BMI:  Body mass index is 26.19 kg/m.  Estimated Nutritional Needs:   Kcal:  1900-2100  Protein:  90-105 grams  Fluid:  >/= 2 L/day     Trenton Gammon, MS, RD, LDN, [facility] Inpatient Clinical Dietitian Pager # [phone] After hours/weekend pager # [phone]

## 2018-12-10 NOTE — Progress Notes (Signed)
   12/10/18 1600 Hydrotherapy note. [id number]  Subjective Assessmen  Subjective please don't hurt me  Patient and Family Stated Goals Pt unable to state, hx of dementia  Date of Onset  (present on admission)  Prior Treatments wound center, s/p bedside debridement 11/27/2018 and surgical deb  this admission  Evaluation and Treatment  Evaluation and Treatment Procedures Explained to Patient/Family Yes  Evaluation and Treatment Procedures Patient unable to consent due to mental status  Pressure Injury 11/28/18 Unstageable - Full thickness tissue loss in which the base of the ulcer is covered by slough (yellow, tan, gray, green or brown) and/or eschar (tan, brown or black) in the wound bed. HYDROTHERAPY  Date First Assessed/Time First Assessed: 11/28/18 1330   Location: Sacrum  Location Orientation: Mid  Staging: Unstageable - Full thickness tissue loss in which the base of the ulcer is covered by slough (yellow, tan, gray, green or brown) and/or esch...  Dressing Type Moist to dry;Foam;Gauze (Comment)  Dressing Changed  Dressing Change Frequency Twice a day  State of Healing Early/partial granulation  Site / Wound Assessment Black;Granulation tissue;Red;Yellow  % Wound base Red or Granulating 80%  % Wound base Yellow/Fibrinous Exudate 5%  % Wound base Other/Granulation Tissue (Comment) 15% (bone, fascia/ligaments)  Peri-wound Assessment Maceration;Pink;Purple  Wound Length (cm) 8 cm  Wound Width (cm) 11 cm  Wound Depth (cm) 2 cm  Wound Surface Area (cm^2) 88 cm^2  Wound Volume (cm^3) 176 cm^3  Undermining (cm) 2 (from 12 to 5)  Margins Unattached edges (unapproximated)  Drainage Amount Copious  Drainage Description No odor;Serosanguineous  Treatment Hydrotherapy (Pulse lavage)  Hydrotherapy  Pulsed lavage therapy - wound location sacral pressure injury  Pulsed Lavage with Suction (psi) 4 psi  Pulsed Lavage with Suction - Normal Saline Used 500 mL  Pulsed Lavage Tip Tip with splash  shield  Selective Debridement  Selective Debridement - Location sacral injury  Wound Therapy - Assess/Plan/Recommendations  Wound Therapy - Clinical Statement pt seen for follow up wound care. Very little yellow slough in wound bed. areas of dark on perimeter. The  wound is at a stage where PLS is more injurious to the granulation tissue with minimal yellow slough. Will most likely discontinue Hydro therpay after discussion with surgery. Patient is to go to SNF soon.   Wound Therapy - Functional Problem List limited mobility/grossly bedbound/painful with any all movment  Factors Delaying/Impairing Wound Healing Infection - systemic/local;Immobility;Multiple medical problems (sacral osteo)  Hydrotherapy Plan Debridement;Dressing change;Patient/family education  Wound Therapy - Frequency 6X / week  Wound Therapy - Follow Up Recommendations Skilled nursing facility  Wound Plan plans discontinue PLS   Wound Therapy Goals - Improve the function of patient's integumentary system by progressing the wound(s) through the phases of wound healing by:  Decrease Necrotic Tissue to 0  Decrease Necrotic Tissue - Progress Progressing toward goal  Increase Granulation Tissue to 90  Increase Granulation Tissue - Progress Progressing toward goal  Improve Drainage Characteristics Mod  Improve Drainage Characteristics - Progress Progressing toward goal  Patient/Family will be able to  Family able to recall good repositioning techniques.  Patient/Family Instruction Goal - Progress Progressing toward goal  Goals/treatment plan/discharge plan were made with and agreed upon by patient/family Yes  Time For Goal Achievement 2 weeks  Wound Therapy - Potential for Goals Fair  Blanchard Kelch PT Acute Rehabilitation Services Pager [phone] Office [phone]

## 2018-12-10 NOTE — Progress Notes (Signed)
PROGRESS NOTE    Becky Figueroa  ZOX:[id number] DOB: [birth year] DOA: 11/26/2018 PCP: Richmond CampbellKaplan, Kristen W., PA-C    Brief Narrative:  73 y.o.femalewith past medical history significant for dementia with Parkinson's disease, depression, decubitus ulcer getting careatoutpatient wound care centerwho was referred from wound care center due to worsening wound.   Per son, patient was also recently diagnosed with DVT on her right lower extremity andwasstarted on anticoagulation. He was found to have a large sacral decubitus ulcer findings worrisome for osteomyelitis.  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  Continue IV antibiotics IV zosyn , stop date is 01/06/2019.  Underwent debridement by general surgery, plan for hydrotherapy.  Continue dressing changes as per recommendations. Blood cultures have been negative so far.   Volume overload secondary to volume resuscitation in the setting of shock. Lasix prn.  Echocardiogram reviewed.  Last CXR from 2/7, with some mild pulm edema.    Right lower extremity DVT On IV heparin transitioned to DOAC.   Acute blood loss anemia possibly from debridement. She underwent 1 unit of PRBC transfusion on 2/6.  Hemoglobin stable BETWEEN 8 TO 9.  Anemia panel reviewed.   Hypophosphatemia Replaced   Hypomagnesemia and hypokalemia Replaced , repeat in am.     Coag negative  staph in 1 of 4 blood culture bottles Probably a contaminant. Continue to follow blood cultures.    History of Parkinson's disease/dementia Continue with Sinemet and avoid sedatives at this time.  Toxic metabolic encephalopathy improving.pt appears to be back to baseline.    Moderate protein calorie malnutrition Supplementations as recommended by nutritionist.     DVT prophylaxis: Eliquis Code Status: Full code  family Communication: None at bedside today Disposition Plan: Pending clinical improvement possible transfer to floor today.   Consultants:   PCCM  Wound care  General surgery  ID  Procedures: Debridement of sacral decubitus ulcer Antimicrobials: Vancomycin Zosyn Subjective: Patient reports feeling better today she denies any complaints at this time  Objective: Vitals:   12/10/18 1200 12/10/18 1300 12/10/18 1400 12/10/18 1500  BP: (!) 122/48 (!) 114/44 (!) 118/47 (!) 114/48  Pulse: 73 77 85 77  Resp: 19 20 (!) 21 18  Temp:      TempSrc:      SpO2: 91% 91% 93% 96%  Weight:      Height:        Intake/Output Summary (Last 24 hours) at 12/10/2018 1526 Last data filed at 12/10/2018 1500 Gross per 24 hour  Intake 1174.3 ml  Output 2000 ml  Net -825.7 ml   Filed Weights   12/02/18 1212 12/05/18 0513 12/10/18 0400  Weight: 59 kg 80.1 kg 73.6 kg    Examination:  General exam: Ill-appearing but not in any distress at this time Respiratory system: Diminished air entry at the bases, no wheezing or rhonchi Cardiovascular system: S1 & S2 heard, RRR. No JVD,  Gastrointestinal system:abd is soft , non tender non distended bowel sounds good.  Central nervous system: Alert and able to answer all questions appropriately  extremities: Symmetric 5 x 5 power. Skin: unstageable sacral ulcer.  Psychiatry:  Mood & affect appropriate.     Data Reviewed: I have personally reviewed following labs and imaging studies  CBC: Recent Labs  Lab 12/06/18 0247  12/07/18 0338 12/08/18 0326 12/09/18 0317 12/10/18 0245  WBC 3.1* 2.9* 3.2* 4.5 3.4*  HGB 8.6* 8.6* 8.8* 9.1* 8.6*  HCT 27.7* 27.3* 28.4* 30.0* 29.1*  MCV 95.8 98.6 97.3 100.7* 100.7*  PLT 124* 124* 134* 121* 114*   Basic Metabolic Panel: Recent Labs  Lab 12/04/18 0358 12/05/18 0500 12/06/18 0247 12/07/18 0338 12/08/18 0326 12/09/18 0317 12/10/18 0245  NA 143 141 141 142 142 142 142  K 2.9* 3.9 3.6 2.1* 2.3* 2.9* 3.1*  CL 110 110 108 101 97* 94* 91*  CO2 28 27 28  34* 38* 38* 41*  GLUCOSE 134* 154* 149* 143* 138* 147* 159*  BUN 8 10 11 10 11 11 14   CREATININE 0.45 0.45 0.44 0.43* 0.44 0.47 0.48  CALCIUM 7.7* 7.7* 7.6* 7.3* 7.2* 7.5* 7.4*  MG 1.5* 1.9 1.6* 1.7 1.5* 1.8 2.0  PHOS 2.0* 1.4* 2.4* 3.0  --   --   --    GFR: Estimated Creatinine Clearance: 65.2 mL/min (by C-G formula based on SCr of 0.48 mg/dL). Liver Function Tests: Recent Labs  Lab 12/04/18 0358  AST 24  ALT 20  ALKPHOS 102  BILITOT 0.4  PROT 5.0*  ALBUMIN 3.0*   No results for input(s): LIPASE, AMYLASE in the last 168 hours. No results for input(s): AMMONIA in the last 168 hours. Coagulation Profile: No results for input(s): INR, PROTIME in the last 168 hours. Cardiac Enzymes: No results for input(s): CKTOTAL, CKMB, CKMBINDEX, TROPONINI in the last 168 hours. BNP (last 3 results) No results for input(s): PROBNP in the last 8760 hours. HbA1C: No results for input(s): HGBA1C in the last 72 hours. CBG: No results for input(s): GLUCAP in the last 168 hours. Lipid Profile: No results for input(s): CHOL, HDL, LDLCALC, TRIG, CHOLHDL, LDLDIRECT in the last 72 hours. Thyroid Function Tests: No results for input(s): TSH, T4TOTAL, FREET4, T3FREE, THYROIDAB in the last 72 hours. Anemia Panel: Recent Labs    12/08/18 0326  VITAMINB12 317  FOLATE 9.1  FERRITIN 292  TIBC 155*  IRON 38  RETICCTPCT 3.9*   Sepsis Labs: Recent Labs  Lab 12/04/18 0358  PROCALCITON <0.10    Recent Results (from  the past 240 hour(s))  Culture, blood (routine x 2)     Status: Abnormal   Collection Time: 12/02/18  9:45 AM  Result Value Ref Range Status   Specimen Description   Final    BLOOD LEFT HAND Performed at [facility], [address] [place], [place] [address]    Special Requests   Final    BOTTLES DRAWN AEROBIC ONLY Blood Culture results may not be optimal due to an inadequate volume of blood received in culture bottles Performed at [facility], [address] [place], [place] [address]    Culture  Setup Time   Final    GRAM POSITIVE COCCI IN CLUSTERS AEROBIC BOTTLE ONLY CRITICAL VALUE NOTED.  VALUE IS CONSISTENT WITH PREVIOUSLY REPORTED AND CALLED VALUE.    Culture (A)  Final    STAPHYLOCOCCUS EPIDERMIDIS SUSCEPTIBILITIES PERFORMED ON PREVIOUS CULTURE WITHIN THE LAST 5 DAYS. Performed at [facility], [address] [place], [place] [address]  Report Status 12/06/2018 FINAL  Final  Culture, blood (routine x 2)     Status: Abnormal   Collection Time: 12/02/18  9:53 AM  Result Value Ref Range Status   Specimen Description   Final    BLOOD LEFT HAND Performed at [facility], [address], [place], [place] [address]    Special Requests   Final    BOTTLES DRAWN AEROBIC ONLY Blood Culture results may not be optimal due to an inadequate volume of blood received in culture bottles Performed at [facility], [address], [place], [place] [address]    Culture  Setup Time   Final    GRAM POSITIVE COCCI AEROBIC BOTTLE ONLY CRITICAL RESULT CALLED TO, READ BACK BY AND VERIFIED WITH: Thana Ates [id number] MLM Performed at [facility], [address], [place], [place] [address]    Culture STAPHYLOCOCCUS EPIDERMIDIS (A)  Final   Report Status 12/06/2018 FINAL  Final   Organism ID, Bacteria STAPHYLOCOCCUS EPIDERMIDIS  Final      Susceptibility   Staphylococcus epidermidis - MIC*     CIPROFLOXACIN >=8 RESISTANT Resistant     ERYTHROMYCIN >=8 RESISTANT Resistant     GENTAMICIN <=0.5 SENSITIVE Sensitive     OXACILLIN >=4 RESISTANT Resistant     TETRACYCLINE 2 SENSITIVE Sensitive     VANCOMYCIN 1 SENSITIVE Sensitive     TRIMETH/SULFA 80 RESISTANT Resistant     CLINDAMYCIN >=8 RESISTANT Resistant     RIFAMPIN <=0.5 SENSITIVE Sensitive     Inducible Clindamycin NEGATIVE Sensitive     * STAPHYLOCOCCUS EPIDERMIDIS  Blood Culture ID Panel (Reflexed)     Status: Abnormal   Collection Time: 12/02/18  9:53 AM  Result Value Ref Range Status   Enterococcus species NOT DETECTED NOT DETECTED Final   Listeria monocytogenes NOT DETECTED NOT DETECTED Final   Staphylococcus species DETECTED (A) NOT DETECTED Final    Comment: Methicillin (oxacillin) resistant coagulase negative staphylococcus. Possible blood culture contaminant (unless isolated from more than one blood culture draw or clinical case suggests pathogenicity). No antibiotic treatment is indicated for blood  culture contaminants. CRITICAL RESULT CALLED TO, READ BACK BY AND VERIFIED WITH: PHARMD J GADHIA [id number] MLM    Staphylococcus aureus (BCID) NOT DETECTED NOT DETECTED Final   Methicillin resistance DETECTED (A) NOT DETECTED Final    Comment: CRITICAL RESULT CALLED TO, READ BACK BY AND VERIFIED WITH: PHARMD J GADHIA [id number] MLM    Streptococcus species NOT DETECTED NOT DETECTED Final   Streptococcus agalactiae NOT DETECTED NOT DETECTED Final   Streptococcus pneumoniae NOT DETECTED NOT DETECTED Final   Streptococcus pyogenes NOT DETECTED NOT DETECTED Final   Acinetobacter baumannii NOT DETECTED NOT DETECTED Final   Enterobacteriaceae species NOT DETECTED NOT DETECTED Final   Enterobacter cloacae complex NOT DETECTED NOT DETECTED Final   Escherichia coli NOT DETECTED NOT DETECTED Final   Klebsiella oxytoca NOT DETECTED NOT DETECTED Final   Klebsiella pneumoniae NOT DETECTED NOT DETECTED Final   Proteus  species NOT DETECTED NOT DETECTED Final   Serratia marcescens NOT DETECTED NOT DETECTED Final   Haemophilus influenzae NOT DETECTED NOT DETECTED Final   Neisseria meningitidis NOT DETECTED NOT DETECTED Final   Pseudomonas aeruginosa NOT DETECTED NOT DETECTED Final   Candida albicans NOT DETECTED NOT DETECTED Final   Candida glabrata NOT DETECTED NOT DETECTED Final   Candida krusei NOT DETECTED NOT DETECTED Final   Candida parapsilosis NOT DETECTED NOT DETECTED Final  Candida tropicalis NOT DETECTED NOT DETECTED Final    Comment: Performed at [facility], [address], [place], [place] [address]  Culture, Urine     Status: None   Collection Time: 12/02/18  6:23 PM  Result Value Ref Range Status   Specimen Description   Final    URINE, CLEAN CATCH Performed at [facility], [address], [place], [place] [address]    Special Requests   Final    NONE Performed at [facility], [address] [place], [place] [address]    Culture   Final    NO GROWTH Performed at [facility], [address], [place], [place] [address]    Report Status 12/04/2018 FINAL  Final  Culture, blood (routine x 2)     Status: None   Collection Time: 12/05/18  2:15 PM  Result Value Ref Range Status   Specimen Description   Final    BLOOD LEFT HAND Performed at [facility], [address] [place], [place] [address]    Special Requests   Final    BOTTLES DRAWN AEROBIC ONLY Blood Culture adequate volume Performed at [facility], [address], [place], [place] [address]    Culture   Final    NO GROWTH 5 DAYS Performed at [facility], [address], [place], [place] [address]    Report Status 12/10/2018 FINAL  Final  Culture, blood (routine x 2)     Status: None   Collection Time: 12/05/18  2:15 PM  Result Value Ref Range Status   Specimen Description   Final    BLOOD LEFT HAND Performed at [facility], [address], [place], [place] [address]    Special Requests   Final    BOTTLES DRAWN AEROBIC ONLY Blood Culture adequate volume Performed at [facility], [address] [place], [place] [address]    Culture   Final    NO GROWTH 5 DAYS Performed at [facility], [address] [place], [place] [address]    Report Status 12/10/2018 FINAL  Final         Radiology Studies: No results found.      Scheduled Meds: . apixaban  5 mg Oral BID  . carbidopa-levodopa  0.5 tablet Oral TID WC  . Chlorhexidine Gluconate Cloth  6 each Topical Daily  . feeding supplement (ENSURE ENLIVE)  237 mL Oral BID BM  . furosemide  40 mg Intravenous BID  . hydrocortisone sodium succinate  50 mg Intravenous Q6H  . mouth rinse  15 mL Mouth Rinse BID  . midodrine  2.5 mg Oral TID WC  . nutrition supplement (JUVEN)  1 packet Oral BID BM  . potassium chloride  40 mEq Oral TID  . sodium chloride flush  10-40 mL Intracatheter Q12H   Continuous Infusions: . sodium chloride Stopped (11/29/18 1737)  . piperacillin-tazobactam (ZOSYN)  IV 12.5 mL/hr at 12/10/18 1500     LOS: 14 days    Time spent: 35 minutes    Kathlen Mody, MD [facility] Pager [phone]  If 7PM-7AM, please contact night-coverage [email or web address] Password [facility] 12/10/2018, 3:26 PM

## 2018-12-10 NOTE — Progress Notes (Addendum)
PT Cancellation Note  Patient Details Name: Becky Figueroa MRN: [id number] DOB: [birth year]   Cancelled Treatment:    Reason Eval/Treat Not Completed: Fatigue/lethargy limiting ability to participate. Patient asleep after hydrotherapy was performed.    Rada Hay 12/10/2018, 4:16 PM Blanchard Kelch PT Acute Rehabilitation Services Pager [phone] Office [phone]

## 2018-12-10 NOTE — Care Management Note (Signed)
Case Management Note  Patient Details  Name: Becky Figueroa MRN: [id number] Date of Birth: April 30, 1946  Subjective/Objective:                  Discharge Readiness Return to top of Sepsis and Other Febrile Illness, without Focal Infection RRG - ISC  Discharge readiness is indicated by patient meeting Recovery Milestones, including ALL of the following: ? Hemodynamic stability YES  ? Fever absent or reduced YES ? Hypoxemia absent ON O2 VIA [place] ? Mental status at baseline YES ? End-organ dysfunction (eg, myocardial ischemia, renal failure) absent YESA ? Metabolic derangement (eg, dehydration, acidosis) absent YES ? Cultures negative or infection identified and under adequate treatment YES ? Ambulatory YES ? Oral hydration, medications[P] ? iV SOLUMEDROL AND NEBS ? LEVEL OF CARE -ICU MAY BE READY FOR SDU IF NOT FLOOR   Action/Plan: Will follow for progression of care and clinical status. Will follow for case management needs none present at this time.  Expected Discharge Date:  (unknown)               Expected Discharge Plan:     In-House Referral:     Discharge planning Services     Post Acute Care Choice:    Choice offered to:     DME Arranged:    DME Agency:     HH Arranged:    HH Agency:     Status of Service:     If discussed at [employer], dates discussed:    Additional Comments:  Golda Acre, RN 12/10/2018, 11:20 AM

## 2018-12-11 ENCOUNTER — Inpatient Hospital Stay (HOSPITAL_COMMUNITY): Payer: Medicare Other

## 2018-12-11 ENCOUNTER — Other Ambulatory Visit: Payer: Self-pay | Admitting: Neurology

## 2018-12-11 LAB — BASIC METABOLIC PANEL
Anion gap: 6 (ref 5–15)
BUN: 17 mg/dL (ref 8–23)
CHLORIDE: 92 mmol/L — AB (ref 98–111)
CO2: 44 mmol/L — AB (ref 22–32)
Calcium: 7.9 mg/dL — ABNORMAL LOW (ref 8.9–10.3)
Creatinine, Ser: 0.49 mg/dL (ref 0.44–1.00)
GFR calc Af Amer: >60 mL/min (ref >60)
GFR calc non Af Amer: >60 mL/min (ref >60)
Glucose, Bld: 142 mg/dL — ABNORMAL HIGH (ref 70–99)
Potassium: 3.7 mmol/L (ref 3.5–5.1)
Sodium: 142 mmol/L (ref 135–145)

## 2018-12-11 LAB — CBC
HEMATOCRIT: 31.3 % — AB (ref 36.0–46.0)
Hemoglobin: 9.5 g/dL — ABNORMAL LOW (ref 12.0–15.0)
MCH: 31.5 pg (ref 26.0–34.0)
MCHC: 30.4 g/dL (ref 30.0–36.0)
MCV: 103.6 fL — AB (ref 80.0–100.0)
Platelets: 117 10*3/uL — ABNORMAL LOW (ref 150–400)
RBC: 3.02 MIL/uL — ABNORMAL LOW (ref 3.87–5.11)
RDW: 15.7 % — ABNORMAL HIGH (ref 11.5–15.5)
WBC: 4.9 10*3/uL (ref 4.0–10.5)
nRBC: 0 % (ref 0.0–0.2)

## 2018-12-11 MED ORDER — APIXABAN 5 MG PO TABS: 5.0000 mg | ORAL_TABLET | Freq: Two times a day (BID) | ORAL | 0 refills | Status: DC

## 2018-12-11 MED ORDER — MIDODRINE HCL 2.5 MG PO TABS: 2.5000 mg | ORAL_TABLET | Freq: Three times a day (TID) | ORAL | 0 refills | Status: AC

## 2018-12-11 MED ORDER — PIPERACILLIN-TAZOBACTAM 3.375 G IVPB: 3.3750 g | Freq: Three times a day (TID) | INTRAVENOUS | Status: AC

## 2018-12-11 MED ORDER — OXYCODONE-ACETAMINOPHEN 5-325 MG PO TABS: 1.0000 | ORAL_TABLET | Freq: Four times a day (QID) | ORAL | 0 refills | Status: DC | PRN

## 2018-12-11 MED ORDER — SILVER NITRATE-POT NITRATE 75-25 % EX MISC: 10.0000 "application " | Freq: Once | CUTANEOUS | 0 refills | Status: AC | PRN

## 2018-12-11 MED ORDER — ALPRAZOLAM 0.5 MG PO TABS: 0.5000 mg | ORAL_TABLET | Freq: Three times a day (TID) | ORAL | 0 refills | Status: DC | PRN

## 2018-12-11 NOTE — Progress Notes (Signed)
Physical Therapy Treatment Patient Details Name: Becky Figueroa MRN: [id number] DOB: 07-29-1946 Today's Date: 12/11/2018    History of Present Illness Becky Figueroa is an 73 y.o. female past medical history of Parkinson's disease, dementia, essential hypertension, admitted with Septic shock due to sacral osteomyelitis, unstageable sacral pressure injury     PT Comments    The patient crying and expresses pain with mobility. Patient seen for  Assessment of wound. Unable to peursue sitting due to patient crying in pain. RN in  To give medication.   Follow Up Recommendations  SNF     Equipment Recommendations  None recommended by PT    Recommendations for Other Services       Precautions / Restrictions Precautions Precaution Comments: incontinent; sacral wound    Mobility  Bed Mobility   Bed Mobility: Rolling Rolling: +2 for safety/equipment;+2 for physical assistance;Total assist         General bed mobility comments: patient requires assist for arms to reach to rail rolling both directions. patient crying in pain with dressing changed. RN in to give medication  Transfers                    Ambulation/Gait                 Stairs             Wheelchair Mobility    Modified Rankin (Stroke Patients Only)       Balance                                            Cognition Arousal/Alertness: Awake/alert                                            Exercises      General Comments        Pertinent Vitals/Pain Faces Pain Scale: Hurts worst Pain Location: patient cries and moans and groans in pain when any body part is moved. Pain Descriptors / Indicators: Discomfort;Grimacing;Crying Pain Intervention(s): Patient requesting pain meds-RN notified    Home Living                      Prior Function            PT Goals (current goals can now be found in the care plan section)  Progress towards PT goals: Not progressing toward goals - comment(patient has been significantly  impaired in mobility for extended time.)    Frequency    Min 2X/week      PT Plan Current plan remains appropriate    Co-evaluation              AM-PAC PT "6 Clicks" Mobility   Outcome Measure  Help needed turning from your back to your side while in a flat bed without using bedrails?: Total Help needed moving from lying on your back to sitting on the side of a flat bed without using bedrails?: Total Help needed moving to and from a bed to a chair (including a wheelchair)?: Total Help needed standing up from a chair using your arms (e.g., wheelchair or bedside chair)?: Total Help needed to walk in hospital room?: Total Help needed climbing 3-5 steps with a  railing? : Total 6 Click Score: 6    End of Session   Activity Tolerance: Patient limited by pain Patient left: in bed;with nursing/sitter in room Nurse Communication: Mobility status;Need for lift equipment PT Visit Diagnosis: Unsteadiness on feet (R26.81);Difficulty in walking, not elsewhere classified (R26.2)     Time: 8182-9937 PT Time Calculation (min) (ACUTE ONLY): 16 min  Charges:  $Therapeutic Activity: 8-22 mins                     Blanchard Kelch PT Acute Rehabilitation Services Pager [phone] Office [phone]    Rada Hay 12/11/2018, 2:23 PM

## 2018-12-11 NOTE — [place] FL2 (Signed)
[place] MEDICAID FL2 LEVEL OF CARE SCREENING TOOL     IDENTIFICATION  Patient Name: Becky Figueroa Birthdate: 31-Mar-1946 Sex: female Admission Date (Current Location): 11/26/2018  [facility] and [place] Number:  Producer, television/film/video and Address:  [facility],  [address] [place] [address]      Provider Number: [id number]  Attending Physician Name and Address:  Kathlen Mody, MD  Relative Name and Phone Number:       Current Level of Care: Hospital Recommended Level of Care: Skilled Nursing Facility Prior Approval Number:    Date Approved/Denied:   PASRR Number:    Discharge Plan: SNF    Current Diagnoses: Patient Active Problem List   Diagnosis Date Noted  . Coagulase negative Staphylococcus bacteremia   . Malnutrition of moderate degree 12/04/2018  . Constipation   . Osteomyelitis of sacroiliac region (HCC)   . AKI (acute kidney injury) (HCC)   . Sepsis (HCC) 11/26/2018  . Osteomyelitis of sacrum (HCC) 11/26/2018  . Decubitus ulcer with gangrene, stage 4 (HCC)   . Toxic encephalopathy 11/04/2018  . Palliative care by specialist   . Goals of care, counseling/discussion   . Pressure injury of skin 11/03/2018  . Acute metabolic encephalopathy 11/03/2018  . Dehydration 11/02/2018  . Protein calorie malnutrition (HCC) 11/02/2018  . 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 Parkinson's disease (HCC) 04/21/2018  . Nausea 01/20/2018  . Diarrhea 11/26/2013  . Fecal incontinence 11/26/2013  . 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 Anal fissure 03/09/2013  . Unspecified constipation 03/09/2013  . ANXIETY 08/01/2010  . GERD 08/01/2010  . DYSPEPSIA 08/01/2010  . IRRITABLE BOWEL SYNDROME 08/01/2010    Orientation RESPIRATION BLADDER Height & Weight     Self  Normal Continent Weight: 162 lb 4.1 oz (73.6 kg) Height:  5\' 6"  (167.6 cm)  BEHAVIORAL SYMPTOMS/MOOD NEUROLOGICAL BOWEL NUTRITION STATUS      Continent Diet(See dc summary)   AMBULATORY STATUS COMMUNICATION OF NEEDS Skin   Extensive Assist Verbally PU Stage and Appropriate Care(sacrum unstagable,  daily wet to dry dressing changes)                       Personal Care Assistance Level of Assistance  Bathing, Feeding, Dressing Bathing Assistance: Maximum assistance Feeding assistance: Independent Dressing Assistance: Maximum assistance     Functional Limitations Info  Sight, Hearing, Speech Sight Info: Impaired(Glasses) Hearing Info: Adequate Speech Info: Adequate    SPECIAL CARE FACTORS FREQUENCY  PT (By licensed PT), OT (By licensed OT)     PT Frequency: 5x/week OT Frequency: 5x/week            Contractures Contractures Info: Not present    Additional Factors Info  Code Status, Allergies Code Status Info: Full Code Allergies Info: Sinemet Carbidopa W-levodopa, Sulfa Antibiotics           Current Medications (12/11/2018):  This is the current hospital active medication list Current Facility-Administered Medications  Medication Dose Route Frequency Provider Last Rate Last Dose  . 0.9 %  sodium chloride infusion  250 mL Intravenous Continuous Karie Soda, MD   Stopped at 11/29/18 1737  . acetaminophen (TYLENOL) tablet 650 mg  650 mg Oral Q6H PRN Karie Soda, MD   650 mg at 12/09/18 1054   Or  . acetaminophen (TYLENOL) suppository 650 mg  650 mg Rectal Q6H PRN Karie Soda, MD      . ALPRAZolam Prudy Feeler) tablet 0.5 mg  0.5 mg Oral BID PRN  Karie Soda, MD   0.5 mg at 12/10/18 2040  . apixaban (ELIQUIS) tablet 5 mg  5 mg Oral BID Tyrone Nine, MD   5 mg at 12/11/18 8563  . carbidopa-levodopa (SINEMET IR) 25-100 MG per tablet immediate release 0.5 tablet  0.5 tablet Oral TID WC Karie Soda, MD   0.5 tablet at 12/11/18 1125  . Chlorhexidine Gluconate Cloth 2 % PADS 6 each  6 each Topical Daily Karie Soda, MD   6 each at 12/11/18 [phone]  . feeding supplement (ENSURE ENLIVE) (ENSURE ENLIVE) liquid 237 mL  237 mL Oral BID BM Karie Soda, MD   237 mL at 12/11/18 0929  . fentaNYL (SUBLIMAZE) injection 12.5 mcg  12.5 mcg Intravenous Q6H PRN Meuth, Brooke A, PA-C   12.5 mcg at 12/09/18 2329  . MEDLINE mouth rinse  15 mL Mouth Rinse BID Tyrone Nine, MD   15 mL at 12/11/18 0930  . midodrine (PROAMATINE) tablet 2.5 mg  2.5 mg Oral TID WC Hazeline Junker B, MD   2.5 mg at 12/11/18 1125  . morphine 2 MG/ML injection 1-2 mg  1-2 mg Intravenous Q4H PRN Kathlen Mody, MD   2 mg at 12/11/18 0263  . nutrition supplement (JUVEN) (JUVEN) powder packet 1 packet  1 packet Oral BID BM Kathlen Mody, MD   1 packet at 12/11/18 0926  . ondansetron (ZOFRAN) tablet 4 mg  4 mg Oral Q6H PRN Karie Soda, MD       Or  . ondansetron (ZOFRAN) injection 4 mg  4 mg Intravenous Q6H PRN Karie Soda, MD      . oxyCODONE-acetaminophen (PERCOCET/ROXICET) 5-325 MG per tablet 1-2 tablet  1-2 tablet Oral Q6H PRN Kathlen Mody, MD   2 tablet at 12/11/18 1034  . piperacillin-tazobactam (ZOSYN) IVPB 3.375 g  3.375 g Intravenous Trixie Deis, MD   Stopped at 12/11/18 [phone]  . potassium chloride SA (K-DUR,KLOR-CON) CR tablet 40 mEq  40 mEq Oral TID Tyrone Nine, MD   40 mEq at 12/11/18 0929  . silver nitrate applicators applicator 10 application  10 application Topical Once PRN Meuth, Brooke A, PA-C      . sodium chloride flush (NS) 0.9 % injection 10-40 mL  10-40 mL Intracatheter Catha Gosselin, MD   10 mL at 12/11/18 0930  . sodium chloride flush (NS) 0.9 % injection 10-40 mL  10-40 mL Intracatheter PRN Karie Soda, MD   10 mL at 12/09/18 8502  . traMADol (ULTRAM) tablet 50 mg  50 mg Oral Q6H PRN Meuth, Brooke A, PA-C   50 mg at 12/10/18 2049     Discharge Medications: Please see discharge summary for a list of discharge medications.  Relevant Imaging Results:  Relevant Lab Results:   Additional Information [id number], IV zosyn until 01/06/2019  Coralyn Helling, LCSW

## 2018-12-11 NOTE — Progress Notes (Signed)
[facility] Progress Note  9 Days Post-Op  Subjective: CC: wound Seen with PT hydro - wound mostly clean with some visible bone. Pain with dressing changes.   Objective: Vital signs in last 24 hours: Temp:  [97.8 F (36.6 C)-99 F (37.2 C)] 99 F (37.2 C) (02/13 0310) Pulse Rate:  [70-88] 83 (02/13 0800) Resp:  [13-25] 18 (02/13 0800) BP: (84-127)/(41-81) 125/81 (02/13 0800) SpO2:  [89 %-96 %] 95 % (02/13 0800) Last BM Date: 12/08/18  Intake/Output from previous day: 02/12 0701 - 02/13 0700 In: 1466.3 [P.O.:1080; IV Piggyback:386.3] Out: 3200 [Urine:3200] Intake/Output this shift: No intake/output data recorded.  PE: Gen:  Alert, NAD Card:  Regular rate and rhythm Pulm:  Normal effort GU: foley present, sacral wound as below     Lab Results:  Recent Labs    12/10/18 0245 12/11/18 0431  WBC 3.4* 4.9  HGB 8.6* 9.5*  HCT 29.1* 31.3*  PLT 114* 117*   BMET Recent Labs    12/10/18 0245 12/11/18 0431  NA 142 142  K 3.1* 3.7  CL 91* 92*  CO2 41* 44*  GLUCOSE 159* 142*  BUN 14 17  CREATININE 0.48 0.49  CALCIUM 7.4* 7.9*   PT/INR No results for input(s): LABPROT, INR in the last 72 hours. CMP     Component Value Date/Time   NA 142 12/11/2018 0431   K 3.7 12/11/2018 0431   CL 92 (L) 12/11/2018 0431   CO2 44 (H) 12/11/2018 0431   GLUCOSE 142 (H) 12/11/2018 0431   BUN 17 12/11/2018 0431   CREATININE 0.49 12/11/2018 0431   CALCIUM 7.9 (L) 12/11/2018 0431   PROT 5.0 (L) 12/04/2018 0358   ALBUMIN 3.0 (L) 12/04/2018 0358   AST 24 12/04/2018 0358   ALT 20 12/04/2018 0358   ALKPHOS 102 12/04/2018 0358   BILITOT 0.4 12/04/2018 0358   GFRNONAA >60 12/11/2018 0431   GFRAA >60 12/11/2018 0431   Lipase  No results found for: LIPASE     Studies/Results: No results found.  Anti-infectives: Anti-infectives (From admission, onward)   Start     Dose/Rate Route Frequency Ordered Stop   12/05/18 2200  vancomycin (VANCOCIN) 1,250 mg in sodium  chloride 0.9 % 250 mL IVPB     1,250 mg 166.7 mL/hr over 90 Minutes Intravenous Every 24 hours 12/05/18 1035 12/10/18 0011   12/05/18 0915  vancomycin (VANCOCIN) IVPB 1000 mg/200 mL premix     1,000 mg 200 mL/hr over 60 Minutes Intravenous  Once 12/05/18 0906 12/05/18 1053   11/30/18 1200  piperacillin-tazobactam (ZOSYN) IVPB 3.375 g     3.375 g 12.5 mL/hr over 240 Minutes Intravenous Every 8 hours 11/30/18 1133     11/29/18 1200  vancomycin (VANCOCIN) IVPB 1000 mg/200 mL premix  Status:  Discontinued     1,000 mg 200 mL/hr over 60 Minutes Intravenous Every 24 hours 11/28/18 1304 11/29/18 0913   11/28/18 1800  ceFEPIme (MAXIPIME) 2 g in sodium chloride 0.9 % 100 mL IVPB  Status:  Discontinued     2 g 200 mL/hr over 30 Minutes Intravenous Every 12 hours 11/28/18 1304 11/30/18 1117   11/28/18 1200  vancomycin (VANCOCIN) 1,250 mg in sodium chloride 0.9 % 250 mL IVPB  Status:  Discontinued     1,250 mg 166.7 mL/hr over 90 Minutes Intravenous Every 48 hours 11/26/18 1715 11/28/18 1304   11/27/18 0600  ceFEPIme (MAXIPIME) 1 g in sodium chloride 0.9 % 100 mL IVPB  Status:  Discontinued  1 g 200 mL/hr over 30 Minutes Intravenous Every 12 hours 11/26/18 1715 11/28/18 1304   11/26/18 1430  ceFEPIme (MAXIPIME) 2 g in sodium chloride 0.9 % 100 mL IVPB     2 g 200 mL/hr over 30 Minutes Intravenous STAT 11/26/18 1429 11/26/18 1604   11/26/18 1430  vancomycin (VANCOCIN) IVPB 1000 mg/200 mL premix     1,000 mg 200 mL/hr over 60 Minutes Intravenous STAT 11/26/18 1429 11/26/18 1621       Assessment/Plan Parkinson's disease Dementia Anxiety/depression Malnutrition H/o RLE DVT - eliquis restarted 2/9 Septic shock/encephalopathy Hypokalemia - K 3.7, improved  Sacral decubitus ulcerwith osteomyelitis S/pEXCISIONALDEBRIDEMENT SACRAL DECUBITUS2/4 Dr. Michaell Cowing - POD#9 - daily wet to dry dressing changes - d/c hydrotx  ID -maxipime/vancomycin 1/29>>2/2, zosyn 2/2>>, vanc 2/7>2/12 VTE  -SCDs, eliquis FEN -Dysphagia 1 diet Foley - in place  Plan - Once daily dressing changes, will follow up next week  LOS: 15 days    Wells Guiles , [facility] 12/11/2018, 8:21 AM Pager: [phone] Consults: [phone]

## 2018-12-11 NOTE — Progress Notes (Signed)
LCSW following for SNF placement.  Facility cannot accept patient until Sunday 12/14/2018.  LCSW notified attending.  LCSW left message for patients son to provide update.   LCSW will continue to follow.   Beulah Gandy [place] CSW [phone]

## 2018-12-11 NOTE — Progress Notes (Signed)
Patient will be transferred to 1605 once son and daughter Lorin Picket & Lawson Fiscal) return to hospital. Will be approximately 18min-1 hr. They are worried patient will be too anxious if we transfer without them here.

## 2018-12-11 NOTE — Progress Notes (Signed)
PROGRESS NOTE    Becky Figueroa  YME:[id number] DOB: 09-Sep-1946 DOA: 11/26/2018 PCP: Richmond Campbell., PA-C    Brief Narrative:  73 y.o.femalewith past medical history significant for dementia with Parkinson's disease, depression, decubitus ulcer getting careatoutpatient wound care centerwho was referred from wound care center due to worsening wound.   Per son, patient was also recently diagnosed with DVT on her right lower extremity andwasstarted on anticoagulation. She was found to have a large sacral decubitus ulcer findings worrisome for osteomyelitis.  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  Continue IV antibiotics IV zosyn , stop date is 01/06/2019.  Underwent debridement by general surgery, as per surgery hydrotherapy can be discontinued.  Continue dressing changes as per recommendations. Blood cultures have been negative so far. Taper the stress dose steroids.    Volume overload secondary to volume resuscitation in the setting of shock. Lasix prn.  Echocardiogram reviewed.  Last CXR from 2/7, with some mild pulm edema.  Will d.c lasix and repeat CXR today.    Right lower extremity DVT On IV heparin transitioned to DOAC.   Acute blood loss anemia possibly from debridement. She underwent 1 unit of PRBC transfusion on 2/6.  Hemoglobin stable  Between 8 TO 9.  Anemia panel reviewed.    Hypophosphatemia Replaced   Hypomagnesemia and hypokalemia Replaced , repeat in am.     Coag negative staph in 1 of 4 blood culture bottles Probably a contaminant. Continue to follow blood cultures.    History of Parkinson's disease/dementia Continue with Sinemet and avoid sedatives at this time.  Toxic metabolic encephalopathy improving.pt appears to be back to baseline.    Moderate protein calorie malnutrition Supplementations as recommended by nutritionist.     DVT prophylaxis: Eliquis Code Status: Full code  family Communication: None at bedside today Disposition Plan: Pending clinical improvement possible transfer to floor today.   Consultants:   PCCM  Wound care  General surgery  ID  Procedures: Debridement of sacral decubitus ulcer Antimicrobials: zosyn till 3/10/.  Subjective: No new complaints.   Objective: Vitals:   12/11/18 0800 12/11/18 0900 12/11/18 1000 12/11/18 1100  BP: 125/81 (!) 106/44 (!) 111/49 (!) 101/45  Pulse: 83 75 83 79  Resp: 18 16 18 17   Temp: 98.7 F (37.1 C)     TempSrc: Oral     SpO2: 95% 93% 93% 95%  Weight:      Height:        Intake/Output Summary (Last 24 hours) at 12/11/2018 1201 Last data filed at 12/11/2018 1000 Gross per 24 hour  Intake 667.37 ml  Output 3200 ml  Net -2532.63 ml   Filed Weights   12/02/18 1212 12/05/18 0513 12/10/18 0400  Weight: 59 kg 80.1 kg 73.6 kg    Examination:  General exam: calm and comfortable.  Respiratory system: air entry fair , no wheezing or rhonchi.  Cardiovascular system: S1 & S2 heard, RRR. No JVD,  Gastrointestinal system:abd is soft ,  non tender non distended bowel sounds good.  Central nervous system: Alert and able to answer all questions appropriately  extremities: no pedal edema.  Skin: unstageable sacral ulcer.  Psychiatry:  Mood & affect appropriate.     Data Reviewed: I have personally reviewed following labs and imaging studies  CBC: Recent Labs   Lab 12/07/18 0338 12/08/18 0326 12/09/18 0317 12/10/18 0245 12/11/18 0431  WBC 2.9* 3.2* 4.5 3.4* 4.9  HGB 8.6* 8.8* 9.1* 8.6* 9.5*  HCT 27.3* 28.4* 30.0* 29.1* 31.3*  MCV 98.6 97.3 100.7* 100.7* 103.6*  PLT 124* 134* 121* 114* 117*   Basic Metabolic Panel: Recent Labs  Lab 12/05/18 0500 12/06/18 0247 12/07/18 0338 12/08/18 0326 12/09/18 0317 12/10/18 0245 12/11/18 0431  NA 141 141 142 142 142 142 142  K 3.9 3.6 2.1* 2.3* 2.9* 3.1* 3.7  CL 110 108 101 97* 94* 91* 92*  CO2 27 28 34* 38* 38* 41* 44*  GLUCOSE 154* 149* 143* 138* 147* 159* 142*  BUN 10 11 10 11 11 14 17   CREATININE 0.45 0.44 0.43* 0.44 0.47 0.48 0.49  CALCIUM 7.7* 7.6* 7.3* 7.2* 7.5* 7.4* 7.9*  MG 1.9 1.6* 1.7 1.5* 1.8 2.0  --   PHOS 1.4* 2.4* 3.0  --   --   --   --    GFR: Estimated Creatinine Clearance: 65.2 mL/min (by C-G formula based on SCr of 0.49 mg/dL). Liver Function Tests: No results for input(s): AST, ALT, ALKPHOS, BILITOT, PROT, ALBUMIN in the last 168 hours. No results for input(s): LIPASE, AMYLASE in the last 168 hours. No results for input(s): AMMONIA in the last 168 hours. Coagulation Profile: No results for input(s): INR, PROTIME in the last 168 hours. Cardiac Enzymes: No results for input(s): CKTOTAL, CKMB, CKMBINDEX, TROPONINI in the last 168 hours. BNP (last 3 results) No results for input(s): PROBNP in the last 8760 hours. HbA1C: No results for input(s): HGBA1C in the last 72 hours. CBG: No results for input(s): GLUCAP in the last 168 hours. Lipid Profile: No results for input(s): CHOL, HDL, LDLCALC, TRIG, CHOLHDL, LDLDIRECT in the last 72 hours. Thyroid Function Tests: No results for input(s): TSH, T4TOTAL, FREET4, T3FREE, THYROIDAB in the last 72 hours. Anemia Panel: No results for input(s): VITAMINB12, FOLATE, FERRITIN, TIBC, IRON, RETICCTPCT in the last 72 hours. Sepsis Labs: No results for input(s): PROCALCITON, LATICACIDVEN in the last 168 hours.  Recent Results (from  the past 240 hour(s))  Culture, blood (routine x 2)     Status: Abnormal   Collection Time: 12/02/18  9:45 AM  Result Value Ref Range Status   Specimen Description   Final    BLOOD LEFT HAND Performed at [facility], [address] [place], [place] [address]    Special Requests   Final    BOTTLES DRAWN AEROBIC ONLY Blood Culture results may not be optimal due to an inadequate volume of blood received in culture bottles Performed at [facility], [address] [place], [place] [address]    Culture  Setup Time   Final    GRAM POSITIVE COCCI IN CLUSTERS AEROBIC BOTTLE ONLY CRITICAL VALUE NOTED.  VALUE IS CONSISTENT WITH PREVIOUSLY REPORTED AND CALLED VALUE.    Culture (A)  Final    STAPHYLOCOCCUS EPIDERMIDIS SUSCEPTIBILITIES PERFORMED ON PREVIOUS CULTURE WITHIN THE LAST 5 DAYS. Performed at [facility], [address] [place], [place] [address]    Report Status 12/06/2018 FINAL  Final  Culture, blood (routine x  2)     Status: Abnormal   Collection Time: 12/02/18  9:53 AM  Result Value Ref Range Status   Specimen Description   Final    BLOOD LEFT HAND Performed at [facility], [address] [place], [place] [address]    Special Requests   Final    BOTTLES DRAWN AEROBIC ONLY Blood Culture results may not be optimal due to an inadequate volume of blood received in culture bottles Performed at [facility], [address] [place], [place] [address]    Culture  Setup Time   Final    GRAM POSITIVE COCCI AEROBIC BOTTLE ONLY CRITICAL RESULT CALLED TO, READ BACK BY AND VERIFIED WITH: Thana Ates [id number] MLM Performed at [facility], [address] [place], [place] [address]    Culture STAPHYLOCOCCUS EPIDERMIDIS (A)  Final   Report Status 12/06/2018 FINAL  Final   Organism ID, Bacteria STAPHYLOCOCCUS EPIDERMIDIS  Final      Susceptibility   Staphylococcus epidermidis - MIC*     CIPROFLOXACIN >=8 RESISTANT Resistant     ERYTHROMYCIN >=8 RESISTANT Resistant     GENTAMICIN <=0.5 SENSITIVE Sensitive     OXACILLIN >=4 RESISTANT Resistant     TETRACYCLINE 2 SENSITIVE Sensitive     VANCOMYCIN 1 SENSITIVE Sensitive     TRIMETH/SULFA 80 RESISTANT Resistant     CLINDAMYCIN >=8 RESISTANT Resistant     RIFAMPIN <=0.5 SENSITIVE Sensitive     Inducible Clindamycin NEGATIVE Sensitive     * STAPHYLOCOCCUS EPIDERMIDIS  Blood Culture ID Panel (Reflexed)     Status: Abnormal   Collection Time: 12/02/18  9:53 AM  Result Value Ref Range Status   Enterococcus species NOT DETECTED NOT DETECTED Final   Listeria monocytogenes NOT DETECTED NOT DETECTED Final   Staphylococcus species DETECTED (A) NOT DETECTED Final    Comment: Methicillin (oxacillin) resistant coagulase negative staphylococcus. Possible blood culture contaminant (unless isolated from more than one blood culture draw or clinical case suggests pathogenicity). No antibiotic treatment is indicated for blood  culture contaminants. CRITICAL RESULT CALLED TO, READ BACK BY AND VERIFIED WITH: PHARMD J GADHIA [id number] MLM    Staphylococcus aureus (BCID) NOT DETECTED NOT DETECTED Final   Methicillin resistance DETECTED (A) NOT DETECTED Final    Comment: CRITICAL RESULT CALLED TO, READ BACK BY AND VERIFIED WITH: PHARMD J GADHIA [id number] MLM    Streptococcus species NOT DETECTED NOT DETECTED Final   Streptococcus agalactiae NOT DETECTED NOT DETECTED Final   Streptococcus pneumoniae NOT DETECTED NOT DETECTED Final   Streptococcus pyogenes NOT DETECTED NOT DETECTED Final   Acinetobacter baumannii NOT DETECTED NOT DETECTED Final   Enterobacteriaceae species NOT DETECTED NOT DETECTED Final   Enterobacter cloacae complex NOT DETECTED NOT DETECTED Final   Escherichia coli NOT DETECTED NOT DETECTED Final   Klebsiella oxytoca NOT DETECTED NOT DETECTED Final   Klebsiella pneumoniae NOT DETECTED NOT DETECTED Final   Proteus  species NOT DETECTED NOT DETECTED Final   Serratia marcescens NOT DETECTED NOT DETECTED Final   Haemophilus influenzae NOT DETECTED NOT DETECTED Final   Neisseria meningitidis NOT DETECTED NOT DETECTED Final   Pseudomonas aeruginosa NOT DETECTED NOT DETECTED Final   Candida albicans NOT DETECTED NOT DETECTED Final   Candida glabrata NOT DETECTED NOT DETECTED Final   Candida krusei NOT DETECTED NOT DETECTED Final   Candida parapsilosis NOT DETECTED NOT DETECTED Final   Candida tropicalis NOT DETECTED NOT DETECTED Final    Comment:  Performed at [facility], [address] [place], [place] [address]  Culture, Urine     Status: None   Collection Time: 12/02/18  6:23 PM  Result Value Ref Range Status   Specimen Description   Final    URINE, CLEAN CATCH Performed at [facility], [address] [place], [place] [address]    Special Requests   Final    NONE Performed at [facility], [address] [place], [place] [address]    Culture   Final    NO GROWTH Performed at [facility], [address] [place], [place] [address]    Report Status 12/04/2018 FINAL  Final  Culture, blood (routine x 2)     Status: None   Collection Time: 12/05/18  2:15 PM  Result Value Ref Range Status   Specimen Description   Final    BLOOD LEFT HAND Performed at [facility], [address] [place], [place] [address]    Special Requests   Final    BOTTLES DRAWN AEROBIC ONLY Blood Culture adequate volume Performed at [facility], [address] [place], [place] [address]    Culture   Final    NO GROWTH 5 DAYS Performed at [facility], [address] [place], [place] [address]    Report Status 12/10/2018 FINAL  Final  Culture, blood (routine x 2)     Status: None   Collection Time: 12/05/18  2:15 PM  Result Value Ref Range Status   Specimen Description   Final    BLOOD LEFT HAND Performed at [facility], [address] [place], [place] [address]    Special Requests   Final    BOTTLES DRAWN AEROBIC ONLY Blood Culture adequate volume Performed at [facility], [address] [place], [place] [address]    Culture   Final    NO GROWTH 5 DAYS Performed at [facility], [address] [place], [place] [address]    Report Status 12/10/2018 FINAL  Final         Radiology Studies: No results found.      Scheduled Meds: . apixaban  5 mg Oral BID  . carbidopa-levodopa  0.5 tablet Oral TID WC  . Chlorhexidine Gluconate Cloth  6 each Topical Daily  . feeding supplement (ENSURE ENLIVE)  237 mL Oral BID BM  . mouth rinse  15 mL Mouth Rinse BID  . midodrine  2.5 mg Oral TID WC  . nutrition supplement (JUVEN)  1 packet Oral BID BM  . potassium chloride  40 mEq Oral TID  . sodium chloride flush  10-40 mL Intracatheter Q12H   Continuous Infusions: . sodium chloride Stopped (11/29/18 1737)  . piperacillin-tazobactam (ZOSYN)  IV Stopped (12/11/18 0947)     LOS: 15 days    Time spent: 32 minutes    Kathlen ModyVijaya Tamon Parkerson, MD [facility] Pager [phone]  If 7PM-7AM, please contact night-coverage [email or web address] Password [email or web address] 12/11/2018, 12:01 PM

## 2018-12-11 NOTE — Progress Notes (Signed)
PT Cancellation Note  Patient Details Name: Becky Figueroa MRN: [id number] DOB: 1945-12-27   Cancelled Treatment:     Patient seen with [facility], will discontinue  Hydrotherapy at this time. Will continue mobility efforts. Blanchard Kelch PT Acute Rehabilitation Services Pager [phone] Office [phone]    Rada Hay 12/11/2018, 8:35 AM

## 2018-12-12 MED ORDER — FUROSEMIDE 10 MG/ML IJ SOLN
40.0000 mg | Freq: Every day | INTRAMUSCULAR | Status: DC
  Administered 2018-12-12 – 2018-12-16 (×5): 40 mg via INTRAVENOUS
  Filled 2018-12-12 (×5): qty 4

## 2018-12-12 NOTE — Care Management Important Message (Signed)
Important Message  Patient Details  Name: JAICEY LOWIS MRN: [id number] Date of Birth: 03-30-1946   Medicare Important Message Given:  Yes    [name] 12/12/2018, 11:35 AMImportant Message  Patient Details  Name: SOFHIA DOMEN MRN: [id number] Date of Birth: 1946/03/27   Medicare Important Message Given:  Yes    [name] 12/12/2018, 11:35 AM

## 2018-12-12 NOTE — Progress Notes (Signed)
PROGRESS NOTE    Becky Figueroa  NHA:[id number] DOB: 05-16-1946 DOA: 11/26/2018 PCP: Richmond Campbell., PA-C    Brief Narrative:  73 y.o.femalewith past medical history significant for dementia with Parkinson's disease, depression, decubitus ulcer [facility] was referred from wound care center due to worsening wound.   Per son, patient was also recently diagnosed with DVT on her right lower extremity andwasstarted on anticoagulation. She was found to have a large sacral decubitus ulcer findings worrisome for osteomyelitis.  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  Continue IV antibiotics IV zosyn , stop date is 01/06/2019.  Underwent debridement by general surgery, as per surgery hydrotherapy can be discontinued.  Continue dressing changes as per recommendations. Blood cultures have been negative so far.   Volume overload secondary to volume resuscitation in the setting of shock. Lasix prn.  Echocardiogram reviewed.  Last CXR from 2/7, with some mild pulm edema. Repeat CXR showed mild edema.     Right lower extremity DVT On IV heparin transitioned to DOAC.   Acute blood loss anemia possibly from debridement. She underwent 1 unit of PRBC transfusion on 2/6.  Hemoglobin stable  Between 8 to 9.  Anemia panel reviewed.   Hypophosphatemia Replaced   Hypomagnesemia and hypokalemia Replaced , repeat in am.       Coag negative staph in 1 of 4 blood culture bottles Probably a contaminant. Negative so far.    History of Parkinson's disease/dementia Continue with Sinemet and avoid sedatives at this time.  Toxic metabolic encephalopathy improving.pt appears to be back to baseline.    Moderate protein calorie malnutrition Supplementations as recommended by nutritionist.     DVT prophylaxis: Eliquis Code Status: Full code  family Communication: None at bedside today Disposition Plan: pending bed availability   Consultants:   PCCM  Wound care  General surgery  ID  Procedures: Debridement of sacral decubitus ulcer Antimicrobials: zosyn till 3/10/.   Subjective: No new complaints.   Objective: Vitals:   12/11/18 1820 12/11/18 2049 12/12/18 0311 12/12/18 0558  BP: 106/61 129/77 118/69 110/69  Pulse: 88 84 96 87  Resp: 17 17 17 18   Temp: 98.9 F (37.2 C) 97.7 F (36.5 C) 98.4 F (36.9 C) 97.8 F (36.6 C)  TempSrc: Axillary Oral Oral Oral  SpO2: 97% 98% 97% 99%  Weight:      Height:        Intake/Output Summary (Last 24 hours) at 12/12/2018 1542 Last data filed at 12/12/2018 0915 Gross per 24 hour  Intake 578.7 ml  Output 2650 ml  Net -2071.3 ml   Filed Weights   12/02/18 1212 12/05/18 0513 12/10/18 0400  Weight: 59 kg 80.1 kg 73.6 kg    Examination:  General exam: in mild distress from pain.  Respiratory system: diminished air entry at bases.  Cardiovascular system: S1 & S2 heard, RRR. No JVD,  Gastrointestinal system:abd is soft , non tender non distended bowel sounds good.  Central nervous system: Alert and  able to answer all questions appropriately  extremities: no pedal edema.  Skin: unstageable sacral ulcer.  Psychiatry:  Slightly anxious today.     Data Reviewed: I have personally reviewed following labs and imaging studies  CBC: Recent Labs  Lab 12/07/18 0338 12/08/18 0326 12/09/18 0317 12/10/18 0245 12/11/18 0431  WBC 2.9* 3.2* 4.5  3.4* 4.9  HGB 8.6* 8.8* 9.1* 8.6* 9.5*  HCT 27.3* 28.4* 30.0* 29.1* 31.3*  MCV 98.6 97.3 100.7* 100.7* 103.6*  PLT 124* 134* 121* 114* 117*   Basic Metabolic Panel: Recent Labs  Lab 12/06/18 0247 12/07/18 0338 12/08/18 0326 12/09/18 0317 12/10/18 0245 12/11/18 0431  NA 141 142 142 142 142 142  K 3.6 2.1* 2.3* 2.9* 3.1* 3.7  CL 108 101 97* 94* 91* 92*  CO2 28 34* 38* 38* 41* 44*  GLUCOSE 149* 143* 138* 147* 159* 142*  BUN 11 10 11 11 14 17   CREATININE 0.44 0.43* 0.44 0.47 0.48 0.49  CALCIUM 7.6* 7.3* 7.2* 7.5* 7.4* 7.9*  MG 1.6* 1.7 1.5* 1.8 2.0  --   PHOS 2.4* 3.0  --   --   --   --    GFR: Estimated Creatinine Clearance: 65.2 mL/min (by C-G formula based on SCr of 0.49 mg/dL). Liver Function Tests: No results for input(s): AST, ALT, ALKPHOS, BILITOT, PROT, ALBUMIN in the last 168 hours. No results for input(s): LIPASE, AMYLASE in the last 168 hours. No results for input(s): AMMONIA in the last 168 hours. Coagulation Profile: No results for input(s): INR, PROTIME in the last 168 hours. Cardiac Enzymes: No results for input(s): CKTOTAL, CKMB, CKMBINDEX, TROPONINI in the last 168 hours. BNP (last 3 results) No results for input(s): PROBNP in the last 8760 hours. HbA1C: No results for input(s): HGBA1C in the last 72 hours. CBG: No results for input(s): GLUCAP in the last 168 hours. Lipid Profile: No results for input(s): CHOL, HDL, LDLCALC, TRIG, CHOLHDL, LDLDIRECT in the last 72 hours. Thyroid Function Tests: No results for input(s): TSH, T4TOTAL, FREET4, T3FREE, THYROIDAB in the last 72 hours. Anemia Panel: No results for input(s): VITAMINB12, FOLATE, FERRITIN, TIBC, IRON, RETICCTPCT in the last 72 hours. Sepsis Labs: No results for input(s): PROCALCITON, LATICACIDVEN in the last 168 hours.  Recent Results (from the past 240 hour(s))  Culture, Urine     Status: None   Collection Time: 12/02/18  6:23 PM  Result Value Ref Range Status   Specimen Description   Final     URINE, CLEAN CATCH Performed at [facility], [address] [place], [place] [address]    Special Requests   Final    NONE Performed at [facility], [address] [place], [place] [address]    Culture   Final    NO GROWTH Performed at [facility], [address] [place], [place] [address]    Report Status 12/04/2018 FINAL  Final  Culture, blood (routine x 2)     Status: None   Collection Time: 12/05/18  2:15 PM  Result Value Ref Range Status   Specimen Description   Final    BLOOD LEFT HAND Performed at [facility], [address] [place], [place] [address]    Special Requests   Final    BOTTLES DRAWN AEROBIC ONLY Blood Culture adequate volume Performed at [facility], [address] [place], [place] [address]    Culture   Final    NO GROWTH 5 DAYS Performed  at [facility], [address], [place], [place] [address]    Report Status 12/10/2018 FINAL  Final  Culture, blood (routine x 2)     Status: None   Collection Time: 12/05/18  2:15 PM  Result Value Ref Range Status   Specimen Description   Final    BLOOD LEFT HAND Performed at [facility], [address], [place], [place] [address]    Special Requests   Final    BOTTLES DRAWN AEROBIC ONLY Blood Culture adequate volume Performed at [facility], [address], [place], [place] [address]    Culture   Final    NO GROWTH 5 DAYS Performed at [facility], [address] [place], [place] [address]    Report Status 12/10/2018 FINAL  Final         Radiology Studies: Dg Chest Port 1 View  Result Date: 12/11/2018 CLINICAL DATA:  Weakness EXAM: PORTABLE CHEST 1 VIEW COMPARISON:  Chest radiograph 12/05/2018 FINDINGS: Right upper extremity PICC line is present with tip projecting over the right atrium. Low lung volumes. Elevation right hemidiaphragm. Similar-appearing  bilateral interstitial pulmonary opacities. More focal patchy consolidation within the right mid lung and left lower lung. Small bilateral pleural effusions. IMPRESSION: Cardiomegaly and interstitial opacities compatible with edema. Scattered patchy consolidative opacities may represent more focal edema. Infection not excluded. Electronically Signed   By: Annia Belt M.D.   On: 12/11/2018 14:25        Scheduled Meds: . apixaban  5 mg Oral BID  . carbidopa-levodopa  0.5 tablet Oral TID WC  . Chlorhexidine Gluconate Cloth  6 each Topical Daily  . feeding supplement (ENSURE ENLIVE)  237 mL Oral BID BM  . mouth rinse  15 mL Mouth Rinse BID  . midodrine  2.5 mg Oral TID WC  . nutrition supplement (JUVEN)  1 packet Oral BID BM  . potassium chloride  40 mEq Oral TID  . sodium chloride flush  10-40 mL Intracatheter Q12H   Continuous Infusions: . sodium chloride Stopped (11/29/18 1737)  . piperacillin-tazobactam (ZOSYN)  IV 3.375 g (12/12/18 1530)     LOS: 16 days    Time spent: 28  minutes    Kathlen Mody, MD [facility] Pager [phone]  If 7PM-7AM, please contact night-coverage [email or web address] Password [facility] 12/12/2018, 3:42 PM

## 2018-12-13 LAB — URINALYSIS, ROUTINE W REFLEX MICROSCOPIC
Bacteria, UA: NONE SEEN
Bilirubin Urine: NEGATIVE
Glucose, UA: NEGATIVE mg/dL
Ketones, ur: NEGATIVE mg/dL
Leukocytes,Ua: NEGATIVE
Nitrite: NEGATIVE
PH: 9 — AB (ref 5.0–8.0)
Protein, ur: NEGATIVE mg/dL
RBC / HPF: >50 RBC/hpf — ABNORMAL HIGH (ref 0–5)
SPECIFIC GRAVITY, URINE: 1.006 (ref 1.005–1.030)

## 2018-12-13 NOTE — Progress Notes (Signed)
PROGRESS NOTE    Becky Figueroa  ONG:[id number] DOB: 1945-11-01 DOA: 11/26/2018 PCP: Richmond CampbellKaplan, Kristen W., PA-C    Brief Narrative:  73 y.o.femalewith past medical history significant for dementia with Parkinson's disease, depression, decubitus ulcer getting careatoutpatient wound care centerwho was referred from wound care center due to worsening wound.   Per son, patient was also recently diagnosed with DVT on her right lower extremity andwasstarted on anticoagulation. She was found to have a large sacral decubitus ulcer findings worrisome for osteomyelitis.  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  Continue IV antibiotics IV zosyn , stop date is 01/06/2019.  Underwent debridement by general surgery, as per surgery hydrotherapy can be discontinued.  Continue dressing changes as per recommendations. Blood cultures have been negative so far.   Volume overload secondary to volume resuscitation in the setting of shock. Lasix prn.  Echocardiogram reviewed.  Last CXR from 2/7, with some mild pulm edema. Repeat CXR showed mild edema.     Right lower extremity DVT On IV heparin transitioned to DOAC.   Acute blood loss anemia possibly from debridement. She underwent 1 unit of PRBC transfusion on 2/6.  Hemoglobin stable  Between 8 to 9.  Anemia panel reviewed.   Hypophosphatemia Replaced   Hypomagnesemia and hypokalemia Replaced , repeat in am.       Coag negative staph in 1 of 4 blood culture bottles Probably a contaminant. Negative so far.    History of Parkinson's disease/dementia Continue with Sinemet and avoid sedatives at this time.  Toxic metabolic encephalopathy improving.pt appears to be back to baseline.    Moderate protein calorie malnutrition Supplementations as recommended by nutritionist.     DVT prophylaxis: Eliquis Code Status: Full code  family Communication: None at bedside today Disposition Plan: pending bed availability   Consultants:   PCCM  Wound care  General surgery  ID  Procedures: Debridement of sacral decubitus ulcer Antimicrobials: zosyn till 3/10/.   Subjective: No new complaints.   Objective: Vitals:   12/12/18 0558 12/12/18 2059 12/13/18 0651 12/13/18 1303  BP: 110/69 135/75 122/73 (!) 117/59  Pulse: 87 97 91 90  Resp: 18 20 20 20   Temp: 97.8 F (36.6 C) 98.1 F (36.7 C) 97.6 F (36.4 C) 97.8 F (36.6 C)  TempSrc: Oral Oral Oral Oral  SpO2: 99% 97% 97% 99%  Weight:      Height:        Intake/Output Summary (Last 24 hours) at 12/13/2018 1737 Last data filed at 12/13/2018 1700 Gross per 24 hour  Intake 928.33 ml  Output 5600 ml  Net -4671.67 ml   Filed Weights   12/02/18 1212 12/05/18 0513 12/10/18 0400  Weight: 59 kg 80.1 kg 73.6 kg    Examination:  General exam: in mild distress from pain.  Respiratory system: diminished air entry at bases.  Cardiovascular system: S1 & S2 heard, RRR. No JVD,  Gastrointestinal system:abd is soft , non tender non distended bowel sounds good.  Central nervous system: Alert  and able to answer all questions appropriately  extremities: no pedal edema.  Skin: unstageable sacral ulcer.  Psychiatry:  Slightly anxious today.     Data Reviewed: I have personally reviewed following labs and imaging studies  CBC: Recent Labs  Lab 12/07/18 0338 12/08/18 0326 12/09/18 0317 12/10/18 0245 12/11/18 0431  WBC 2.9* 3.2* 4.5  3.4* 4.9  HGB 8.6* 8.8* 9.1* 8.6* 9.5*  HCT 27.3* 28.4* 30.0* 29.1* 31.3*  MCV 98.6 97.3 100.7* 100.7* 103.6*  PLT 124* 134* 121* 114* 117*   Basic Metabolic Panel: Recent Labs  Lab 12/07/18 0338 12/08/18 0326 12/09/18 0317 12/10/18 0245 12/11/18 0431  NA 142 142 142 142 142  K 2.1* 2.3* 2.9* 3.1* 3.7  CL 101 97* 94* 91* 92*  CO2 34* 38* 38* 41* 44*  GLUCOSE 143* 138* 147* 159* 142*  BUN 10 11 11 14 17   CREATININE 0.43* 0.44 0.47 0.48 0.49  CALCIUM 7.3* 7.2* 7.5* 7.4* 7.9*  MG 1.7 1.5* 1.8 2.0  --   PHOS 3.0  --   --   --   --    GFR: Estimated Creatinine Clearance: 65.2 mL/min (by C-G formula based on SCr of 0.49 mg/dL). Liver Function Tests: No results for input(s): AST, ALT, ALKPHOS, BILITOT, PROT, ALBUMIN in the last 168 hours. No results for input(s): LIPASE, AMYLASE in the last 168 hours. No results for input(s): AMMONIA in the last 168 hours. Coagulation Profile: No results for input(s): INR, PROTIME in the last 168 hours. Cardiac Enzymes: No results for input(s): CKTOTAL, CKMB, CKMBINDEX, TROPONINI in the last 168 hours. BNP (last 3 results) No results for input(s): PROBNP in the last 8760 hours. HbA1C: No results for input(s): HGBA1C in the last 72 hours. CBG: No results for input(s): GLUCAP in the last 168 hours. Lipid Profile: No results for input(s): CHOL, HDL, LDLCALC, TRIG, CHOLHDL, LDLDIRECT in the last 72 hours. Thyroid Function Tests: No results for input(s): TSH, T4TOTAL, FREET4, T3FREE, THYROIDAB in the last 72 hours. Anemia Panel: No results for input(s): VITAMINB12, FOLATE, FERRITIN, TIBC, IRON, RETICCTPCT in the last 72 hours. Sepsis Labs: No results for input(s): PROCALCITON, LATICACIDVEN in the last 168 hours.  Recent Results (from the past 240 hour(s))  Culture, blood (routine x 2)     Status: None   Collection Time: 12/05/18  2:15 PM  Result Value Ref Range Status   Specimen Description   Final    BLOOD LEFT HAND Performed at [facility], [address], [place], [place] [address]    Special Requests   Final    BOTTLES DRAWN AEROBIC ONLY Blood Culture adequate volume Performed at [facility], [address] [place], [place] [address]    Culture   Final    NO GROWTH 5 DAYS Performed at [facility], [address] [place], [place] [address]    Report Status 12/10/2018 FINAL  Final  Culture, blood (routine x 2)     Status: None   Collection Time: 12/05/18  2:15 PM  Result Value Ref Range Status   Specimen Description   Final    BLOOD LEFT HAND Performed at [facility], [address], [place], [place] [address]    Special Requests   Final    BOTTLES DRAWN AEROBIC ONLY Blood Culture adequate volume Performed at [facility], [address] [place], [place] [address]    Culture   Final    NO GROWTH 5 DAYS  Performed at [facility], [address] [place], [place] [address]    Report Status 12/10/2018 FINAL  Final         Radiology Studies: No results found.      Scheduled Meds: . apixaban  5 mg Oral BID  . carbidopa-levodopa  0.5 tablet Oral TID WC  . Chlorhexidine Gluconate Cloth  6 each Topical Daily  . feeding supplement (ENSURE ENLIVE)  237 mL Oral BID BM  . furosemide  40 mg Intravenous Daily  . mouth rinse  15 mL Mouth Rinse BID  . midodrine  2.5 mg Oral TID WC  . nutrition supplement (JUVEN)  1 packet Oral BID BM  . potassium chloride  40 mEq Oral TID  . sodium chloride flush  10-40 mL Intracatheter Q12H   Continuous Infusions: . sodium chloride Stopped (11/29/18 1737)  . piperacillin-tazobactam (ZOSYN)  IV 3.375 g (12/13/18 1456)     LOS: 17 days    Time spent: 28  minutes    Kathlen Mody, MD [facility] Pager [phone]  If 7PM-7AM, please contact night-coverage [email or web address] Password [facility] 12/13/2018, 5:37 PM

## 2018-12-14 NOTE — Progress Notes (Signed)
Patient is ready to discharge to [facility].  Unfortunately, PASSR number expired recently. Resubmitted for new number and it is level II pending. Will likely receive new number Monday and patient will d/c then.  Will update MD, RN, and family.   Enid Cutter, MSW, LCSWA Clinical Social Work [phone]

## 2018-12-14 NOTE — Discharge Summary (Signed)
Physician Discharge Summary  Becky Figueroa:[id number] DOB: 1946/05/10 DOA: 11/26/2018  PCP: Richmond Campbell., PA-C  Admit date: 11/26/2018 Discharge date: 12/14/2018  Admitted From: SNF.  Disposition: SNF.   Recommendations for Outpatient Follow-up:  1. Follow up with PCP in 1-2 weeks 2. Please obtain BMP/CBC in one week   Discharge Condition: Guarded  CODE STATUS: full code.  Diet recommendation: Heart Healthy  Brief/Interim Summary: 73 y.o.femalewith past medical history significant for dementia with Parkinson's disease, depression, decubitus ulcer getting careatoutpatient wound care centerwho was referred from wound care center due to worsening wound.   Per son, patient was also recently diagnosed with DVT on her right lower extremity andwasstarted on anticoagulation. She was found to have a large sacral decubitus ulcer findings worrisome for osteomyelitis.  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  Continue IV antibiotics IV zosyn , stop date is 01/06/2019.  Underwent debridement by general surgery, as per surgery hydrotherapy can be discontinued.  Continue dressing changes as per recommendations. Blood cultures have been negative so far.   Volume overload secondary to volume resuscitation in the setting of shock. Lasix prn.  Echocardiogram reviewed.  Last CXR from 2/13, with some mild pulm edema.   Right lower extremity  DVT On IV heparin transitioned to DOAC.   Acute blood loss anemia possibly from debridement. She underwent 1 unit of PRBC transfusion on 2/6.  Hemoglobin stable  Between 8 to 9.  Anemia panel reviewed.   Hypophosphatemia Replaced   Hypomagnesemia and hypokalemia Replaced , repeat in am.     Coag negative staph in 1 of 4 blood culture bottles Probably a contaminant. Negative so far.    History of Parkinson's disease/dementia Continue with Sinemet and avoid sedatives at this time.  Toxic metabolic encephalopathy improving.pt appears to be back to baseline.    Moderate protein calorie malnutrition Supplementations as recommended by nutritionist  Discharge Instructions  Discharge Instructions    Diet - low sodium heart healthy   Complete by:  As directed    Diet - low sodium heart healthy   Complete by:  As directed    Discharge instructions   Complete by:  As directed    Please follow up with wound care as recommended by surgery.  Please follow up with PCP in one week.  Please check cbc and bmp in one week.   Discharge instructions   Complete by:  As directed    Please follow up with PCP in one week.     Allergies as of 12/14/2018      Reactions   Sinemet [carbidopa W-levodopa] Nausea And Vomiting   Able to tolerate with Zofran   Sulfa Antibiotics Other (See Comments)   Flushing and hot      Medication List    STOP taking these medications   ciprofloxacin 500 MG tablet Commonly known as:  CIPRO   fluconazole 100 MG tablet Commonly known as:  DIFLUCAN     TAKE these medications   ALPRAZolam 0.5 MG  tablet Commonly known as:  XANAX Take 1 tablet (0.5 mg total) by mouth 3 (three) times daily as needed for anxiety.   apixaban 5 MG Tabs tablet Commonly known as:  ELIQUIS Take 1 tablet (5 mg total) by mouth 2 (two) times daily. What changed:  how much to take   carbidopa-levodopa 25-100 MG tablet Commonly known as:  SINEMET IR Take 0.5  tablets by mouth 3 (three) times daily.   lansoprazole 30 MG capsule Commonly known as:  PREVACID Take 30 mg by mouth every morning.   midodrine 2.5 MG tablet Commonly known as:  PROAMATINE Take 1 tablet (2.5 mg total) by mouth 3 (three) times daily with meals.   ondansetron 4 MG tablet Commonly known as:  ZOFRAN Take 1 tablet (4 mg total) by mouth every 8 (eight) hours as needed for nausea or vomiting. What changed:    when to take this  additional instructions   ONE-A-DAY WOMENS 50 PLUS PO Take 1 tablet by mouth daily.   oxyCODONE-acetaminophen 5-325 MG tablet Commonly known as:  PERCOCET/ROXICET Take 1-2 tablets by mouth every 6 (six) hours as needed for severe pain.   piperacillin-tazobactam 3.375 GM/50ML IVPB Commonly known as:  ZOSYN Inject 50 mLs (3.375 g total) into the vein every 8 (eight) hours for 26 days.   RESOURCE 2.0 Liqd Take 120 mLs by mouth 2 (two) times daily.   NUTRITIONAL SUPPLEMENTS PO Take 1 Units by mouth daily at 12 noon. "Magic Cup."   silver nitrate applicators 75-25 % applicator Apply 10 Sticks (10 application total) topically once as needed for bleeding (bleeding from sacral wound).   traMADol 50 MG tablet Commonly known as:  ULTRAM Take 50 mg by mouth every 6 (six) hours as needed for moderate pain.      Follow-up Information    Richmond Campbell., PA-C. Schedule an appointment as soon as possible for a visit in 1 week(s).   Specialty:  Family Medicine Contact information: [address] [place] [address] [phone]          Allergies  Allergen Reactions  . Sinemet [Carbidopa W-Levodopa] Nausea And Vomiting    Able to tolerate with Zofran  . Sulfa Antibiotics Other (See Comments)    Flushing and hot    Consultations: GENERAL SURGERY PCCM.  ID   Procedures/Studies: Dg Chest 1 View  Result Date: 11/28/2018 CLINICAL DATA:  Hypoxia. EXAM: CHEST  1 VIEW COMPARISON:  11/26/2018. FINDINGS: Stable poor  inspiration and grossly normal sized heart. Slight increase in diffuse prominence of the interstitial markings. Interval minimal right pleural effusion. Diffuse osteopenia and mild thoracic spine degenerative changes. Minimal right shoulder degenerative changes. IMPRESSION: Interval minimal changes of congestive heart failure superimposed on chronic interstitial lung disease. Electronically Signed   By: Beckie Salts M.D.   On: 11/28/2018 15:10   Dg Sacrum/coccyx  Result Date: 11/26/2018 CLINICAL DATA:  Sacral decubitus ulcer. EXAM: SACRUM AND COCCYX - 2+ VIEW COMPARISON:  CT abdomen and pelvis 04/19/2016. FINDINGS: The inferior aspect of the sacrum appears exposed to air. Technique is limited but there appears to be bony destructive change in the underlying distal sacrum worrisome for osteomyelitis. No radiopaque foreign body is identified. IMPRESSION: Large appearing sacral decubitus ulcer with findings worrisome for osteomyelitis in the distal sacrum. MRI of the sacrum with contrast is the best test for further evaluation. Electronically Signed   By: Drusilla Kanner M.D.   On: 11/26/2018 12:58   Dg Abd 1 View  Result Date:  11/26/2018 CLINICAL DATA:  Constipation EXAM: ABDOMEN - 1 VIEW COMPARISON:  08/08/2018 FINDINGS: Moderately dilated colon which is gas-filled. Small bowel nondilated. Surgical clips right upper quadrant. Left renal calculus unchanged. IMPRESSION: Colonic ileus Electronically Signed   By: Marlan Palau M.D.   On: 11/26/2018 16:42   Ct Head Wo Contrast  Result Date: 11/26/2018 CLINICAL DATA:  Encephalopathy EXAM: CT HEAD WITHOUT CONTRAST TECHNIQUE: Contiguous axial images were obtained from the base of the skull through the vertex without intravenous contrast. Sagittal and coronal MPR images reconstructed from axial data set. COMPARISON:  11/02/2018 Correlation: MR brain 11/02/2018. FINDINGS: Brain: Normal ventricular morphology. No midline shift or mass effect. Normal appearance of  brain parenchyma. No intracranial hemorrhage, mass lesion or evidence of acute infarction. Minimal chronic prominence of the extra-axial spaces bilaterally over the convexities correspond to minimal chronic subdural collections likely hygromas, not significantly changed. No new extra-axial collections. Vascular: Unremarkable Skull: Intact though mildly demineralized. Sinuses/Orbits: Clear Other: N/A IMPRESSION: No acute intracranial abnormalities. 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 Electronically Signed   By: Ulyses Southward M.D.   On: 11/26/2018 16:23   Mr Sacrum Si Joints W Wo Contrast  Result Date: 11/28/2018 CLINICAL DATA:  Open sacral decubitus ulcer. EXAM: MRI PELVIS WITHOUT AND WITH CONTRAST TECHNIQUE: Multiplanar multisequence MR imaging of the pelvis was performed both before and after administration of intravenous contrast. CONTRAST:  6.5 cc Gadavist COMPARISON:  11/26/2018 FINDINGS: Urinary Tract:  Decompressed urinary bladder. Bowel: Stool burden within the rectum. No bowel obstruction or inflammation. Vascular/Lymphatic: Negative Reproductive:  Negative Other: There is an approximately 7.4 x 2.2 x 6.8 cm sacral decubitus ulcer to the left midline. Musculoskeletal: Abnormal marrow edema of the S5 segment of the sacrum and coccyx adjacent to the sacral decubitus ulcer consistent with osteomyelitis. Adjacent myositis and edema the gluteal musculature. Mild presacral edema is also noted. IMPRESSION: 1. Osteomyelitis of the lower sacrum and coccyx associated with a large sacral decubitus ulcer measuring 7.4 x 2.2 x 6.8 cm. 2. Diffuse gluteal edema with trace presacral edema compatible with changes of myositis. No pyomyositis is identified. Electronically Signed   By: Tollie Eth M.D.   On: 11/28/2018 23:16   Dg Chest Port 1 View  Result Date: 12/11/2018 CLINICAL DATA:  Weakness EXAM: PORTABLE CHEST 1 VIEW COMPARISON:  Chest radiograph  12/05/2018 FINDINGS: Right upper extremity PICC line is present with tip projecting over the right atrium. Low lung volumes. Elevation right hemidiaphragm. Similar-appearing bilateral interstitial pulmonary opacities. More focal patchy consolidation within the right mid lung and left lower lung. Small bilateral pleural effusions. IMPRESSION: Cardiomegaly and interstitial opacities compatible with edema. Scattered patchy consolidative opacities may represent more focal edema. Infection not excluded. Electronically Signed   By: Annia Belt M.D.   On: 12/11/2018 14:25   Dg Chest Port 1 View  Result Date: 12/05/2018 CLINICAL DATA:  Fluid overload.  Cough. EXAM: PORTABLE CHEST 1 VIEW COMPARISON:  12/03/2018 FINDINGS: Right arm PICC tip in the proximal right atrium as seen previously. Interstitial and alveolar pulmonary density consistent with edema. Coexistent pneumonia is certainly possible. Small amount of pleural fluid noted on the right. IMPRESSION: Similar appearance to the study of 2 days ago. Probable mild pulmonary edema, possibly with associated scattered infiltrates. Small right effusion. Electronically Signed   By: Paulina Fusi M.D.   On: 12/05/2018 14:23   Dg Chest Port 1 View  Result Date: 12/03/2018 CLINICAL DATA:  Respiratory failure EXAM: PORTABLE  CHEST 1 VIEW COMPARISON:  November 28, 2018 FINDINGS: Central catheter tip is in the right atrium. No pneumothorax. There is a small right pleural effusion with atelectasis and suspected mild consolidation in the right base. There is patchy infiltrate in the left base as well. Subtle airspace opacity in the right upper lobe may represent a small focus of pneumonia or alveolar edema. Note that there is cardiomegaly with pulmonary venous hypertension and mild interstitial pulmonary edema. There is aortic atherosclerosis. No bone lesions. IMPRESSION: Pulmonary vascular congestion with areas of interstitial pulmonary edema. Question alveolar edema right upper  lobe versus pneumonia. Small right pleural effusion with right base atelectasis. Question edema versus patchy pneumonia in the lung bases currently. There is aortic atherosclerosis. Central catheter tip in right atrium without pneumothorax Electronically Signed   By: Bretta Bang III M.D.   On: 12/03/2018 07:47   Dg Chest Port 1 View  Result Date: 11/26/2018 CLINICAL DATA:  Leukocytosis, decubitus ulcer, shortness of breath EXAM: PORTABLE CHEST 1 VIEW COMPARISON:  11/02/2018 FINDINGS: Similar low lung volumes with chronic appearing bronchitic changes and interstitial lung disease as before. No definite superimposed pneumonia, collapse, edema, effusion or pneumothorax. Trachea midline. Aorta atherosclerotic. Degenerative changes of the spine. Bowel distension noted in the epigastric region. IMPRESSION: Similar low lung volumes and chronic interstitial lung disease. No significant interval change or superimposed acute process. Electronically Signed   By: Judie Petit.  Shick M.D.   On: 11/26/2018 16:27   [place] Ekg Site Rite  Result Date: 11/29/2018 If Site Rite image not attached, placement could not be confirmed due to current cardiac rhythm.      Subjective: No chest pain or sob   Discharge Exam: Vitals:   12/14/18 0217 12/14/18 0622  BP: (!) 101/57 115/72  Pulse: 80 80  Resp: 16 20  Temp: 98 F (36.7 C) 98.2 F (36.8 C)  SpO2: 99% 100%   Vitals:   12/13/18 1303 12/13/18 2212 12/14/18 0217 12/14/18 0622  BP: (!) 117/59 106/63 (!) 101/57 115/72  Pulse: 90 88 80 80  Resp: 20 20 16 20   Temp: 97.8 F (36.6 C) 98.9 F (37.2 C) 98 F (36.7 C) 98.2 F (36.8 C)  TempSrc: Oral Oral Oral Oral  SpO2: 99% 99% 99% 100%  Weight:      Height:        General: Pt is alert, awake, not in acute distress Cardiovascular: RRR, S1/S2 +, no rubs, no gallops Respiratory: CTA bilaterally, no wheezing, no rhonchi Abdominal: Soft, NT, ND, bowel sounds + Extremities: no edema, no cyanosis    The results  of significant diagnostics from this hospitalization (including imaging, microbiology, ancillary and laboratory) are listed below for reference.     Microbiology: Recent Results (from the past 240 hour(s))  Culture, blood (routine x 2)     Status: None   Collection Time: 12/05/18  2:15 PM  Result Value Ref Range Status   Specimen Description   Final    BLOOD LEFT HAND Performed at [facility], [address] [place], [place] [address]    Special Requests   Final    BOTTLES DRAWN AEROBIC ONLY Blood Culture adequate volume Performed at [facility], [address], [place], [place] [address]    Culture   Final    NO GROWTH 5 DAYS Performed at [facility], [address], [place], [place] [address]    Report Status 12/10/2018 FINAL  Final  Culture, blood (routine x 2)  Status: None   Collection Time: 12/05/18  2:15 PM  Result Value Ref Range Status   Specimen Description   Final    BLOOD LEFT HAND Performed at [facility], [address] [place], [place] [address]    Special Requests   Final    BOTTLES DRAWN AEROBIC ONLY Blood Culture adequate volume Performed at [facility], [address] [place], [place] [address]    Culture   Final    NO GROWTH 5 DAYS Performed at [facility], [address] [place], [place] [address]    Report Status 12/10/2018 FINAL  Final     Labs: BNP (last 3 results) No results for input(s): BNP in the last 8760 hours. Basic Metabolic Panel: Recent Labs  Lab 12/08/18 0326 12/09/18 0317 12/10/18 0245 12/11/18 0431  NA 142 142 142 142  K 2.3* 2.9* 3.1* 3.7  CL 97* 94* 91* 92*  CO2 38* 38* 41* 44*  GLUCOSE 138* 147* 159* 142*  BUN 11 11 14 17   CREATININE 0.44 0.47 0.48 0.49  CALCIUM 7.2* 7.5* 7.4* 7.9*  MG 1.5* 1.8 2.0  --    Liver Function Tests: No results for input(s): AST, ALT, ALKPHOS, BILITOT, PROT, ALBUMIN in the last 168 hours. No  results for input(s): LIPASE, AMYLASE in the last 168 hours. No results for input(s): AMMONIA in the last 168 hours. CBC: Recent Labs  Lab 12/08/18 0326 12/09/18 0317 12/10/18 0245 12/11/18 0431  WBC 3.2* 4.5 3.4* 4.9  HGB 8.8* 9.1* 8.6* 9.5*  HCT 28.4* 30.0* 29.1* 31.3*  MCV 97.3 100.7* 100.7* 103.6*  PLT 134* 121* 114* 117*   Cardiac Enzymes: No results for input(s): CKTOTAL, CKMB, CKMBINDEX, TROPONINI in the last 168 hours. BNP: Invalid input(s): POCBNP CBG: No results for input(s): GLUCAP in the last 168 hours. D-Dimer No results for input(s): DDIMER in the last 72 hours. Hgb A1c No results for input(s): HGBA1C in the last 72 hours. Lipid Profile No results for input(s): CHOL, HDL, LDLCALC, TRIG, CHOLHDL, LDLDIRECT in the last 72 hours. Thyroid function studies No results for input(s): TSH, T4TOTAL, T3FREE, THYROIDAB in the last 72 hours.  Invalid input(s): FREET3 Anemia work up No results for input(s): VITAMINB12, FOLATE, FERRITIN, TIBC, IRON, RETICCTPCT in the last 72 hours. Urinalysis    Component Value Date/Time   COLORURINE STRAW (A) 12/13/2018 1356   APPEARANCEUR CLEAR 12/13/2018 1356   LABSPEC 1.006 12/13/2018 1356   PHURINE 9.0 (H) 12/13/2018 1356   GLUCOSEU NEGATIVE 12/13/2018 1356   HGBUR LARGE (A) 12/13/2018 1356   BILIRUBINUR NEGATIVE 12/13/2018 1356   KETONESUR NEGATIVE 12/13/2018 1356   PROTEINUR NEGATIVE 12/13/2018 1356   UROBILINOGEN 1.0 06/07/2014 2200   NITRITE NEGATIVE 12/13/2018 1356   LEUKOCYTESUR NEGATIVE 12/13/2018 1356   Sepsis Labs Invalid input(s): PROCALCITONIN,  WBC,  LACTICIDVEN Microbiology Recent Results (from the past 240 hour(s))  Culture, blood (routine x 2)     Status: None   Collection Time: 12/05/18  2:15 PM  Result Value Ref Range Status   Specimen Description   Final    BLOOD LEFT HAND Performed at [facility], [address] [place], [place] [address]    Special Requests   Final    BOTTLES  DRAWN AEROBIC ONLY Blood Culture adequate volume Performed at [facility], [address] [place], [place] [address]    Culture   Final    NO GROWTH 5 DAYS Performed at [facility], [address] [place], [place] [address]    Report Status 12/10/2018 FINAL  Final  Culture, blood (routine x 2)     Status: None   Collection Time: 12/05/18  2:15 PM  Result Value Ref Range Status   Specimen Description   Final    BLOOD LEFT HAND Performed at [facility], [address] [place], [place] [address]    Special Requests   Final    BOTTLES DRAWN AEROBIC ONLY Blood Culture adequate volume Performed at [facility], [address] [place], [place] [address]    Culture   Final    NO GROWTH 5 DAYS Performed at [facility], [address] [place], [place] [address]    Report Status 12/10/2018 FINAL  Final     Time coordinating discharge: 35  minutes  SIGNED:   Kathlen ModyVijaya Alisha Burgo, MD  [facility] 12/14/2018, 9:44 AM Pager   If 7PM-7AM, please contact night-coverage [email or web address] Password [email or web address]

## 2018-12-15 NOTE — Care Management Important Message (Signed)
Important Message  Patient Details  Name: Becky Figueroa MRN: [id number] Date of Birth: 10-Oct-1946   Medicare Important Message Given:  Yes    [name] 12/15/2018, 11:44 AMImportant Message  Patient Details  Name: Becky Figueroa MRN: [id number] Date of Birth: 25-Nov-1945   Medicare Important Message Given:  Yes    [name] 12/15/2018, 11:44 AM

## 2018-12-15 NOTE — Progress Notes (Signed)
PT Cancellation Note  Patient Details Name: Becky Figueroa MRN: [id number] DOB: [birth year]   Cancelled Treatment:    Reason Eval/Treat Not Completed: Pain limiting ability to participate Nursing request PT to hold PT today due to patients pain levels and poor tolerance to repositioning in bed. Will continue to follow.   Kipp Laurence, PT, DPT Supplemental Physical Therapist 12/15/18 2:05 PM Pager: [phone] Office: [phone]

## 2018-12-15 NOTE — Progress Notes (Signed)
Pt awaiting PASRR approval prior to being able to transition to SNF. Faxing requested documents to [facility]. Updated facility [facility]).  Ilean Skill, MSW, LCSW Clinical Social Work 12/15/2018 [phone]

## 2018-12-15 NOTE — Progress Notes (Signed)
13 Days Post-Op    CC:  Sacral decubitus  Subjective: Wound remains clean and No new necrosis. Picture below  Objective: Vital signs in last 24 hours: Temp:  [97.7 F (36.5 C)-98.1 F (36.7 C)] 97.8 F (36.6 C) (02/17 0607) Pulse Rate:  [88-98] 96 (02/17 0607) Resp:  [16-20] 20 (02/17 0607) BP: (107-125)/(63-74) 110/63 (02/17 0607) SpO2:  [91 %-100 %] 98 % (02/17 0607) Last BM Date: 12/14/18 600 PO 196 IV Urine 3150 Stool x 1 Afebrile, VSS No labs  Intake/Output from previous day: 02/16 0701 - 02/17 0700 In: 796.7 [P.O.:600; IV Piggyback:196.7] Out: 3150 [Urine:3150] Intake/Output this shift: No intake/output data recorded.  General:  Alert, tearful at the thought of moving.  Wound is painful but clean     Lab Results:  No results for input(s): WBC, HGB, HCT, PLT in the last 72 hours.  BMET No results for input(s): NA, K, CL, CO2, GLUCOSE, BUN, CREATININE, CALCIUM in the last 72 hours. PT/INR No results for input(s): LABPROT, INR in the last 72 hours.  No results for input(s): AST, ALT, ALKPHOS, BILITOT, PROT, ALBUMIN in the last 168 hours.   Lipase  No results found for: LIPASE   Medications: . apixaban  5 mg Oral BID  . carbidopa-levodopa  0.5 tablet Oral TID WC  . Chlorhexidine Gluconate Cloth  6 each Topical Daily  . feeding supplement (ENSURE ENLIVE)  237 mL Oral BID BM  . furosemide  40 mg Intravenous Daily  . mouth rinse  15 mL Mouth Rinse BID  . midodrine  2.5 mg Oral TID WC  . nutrition supplement (JUVEN)  1 packet Oral BID BM  . potassium chloride  40 mEq Oral TID  . sodium chloride flush  10-40 mL Intracatheter Q12H    Assessment/Plan  Parkinson's disease Dementia Anxiety/depression Malnutrition H/o RLE DVT - eliquisrestarted 2/9 Septic shock/encephalopathy Hypokalemia - K 3.7, improved  Sacral decubitus ulcerwith osteomyelitis S/pEXCISIONALDEBRIDEMENT SACRAL DECUBITUS2/4 Dr. Michaell Cowing - POD# 13 - daily wet to dry dressing  changes - d/c hydrotx  ID -maxipime/vancomycin 1/29>>2/2, zosyn 2/2>>, vanc 2/7>2/12 VTE -SCDs,eliquis FEN -Dysphagia 1 diet Foley - in place   PLan:  Continue wound care, no further surgical need.   LOS: 19 days    Becky Figueroa 12/15/2018 [phone]

## 2018-12-15 NOTE — Discharge Summary (Addendum)
Physician Discharge Summary  Becky Mountainatricia P Figueroa WUJ:[id number] DOB: 03/28/1946 DOA: 11/26/2018  PCP: Richmond CampbellKaplan, Kristen W., PA-C  Admit date: 11/26/2018 Discharge date: 12/15/2018  Admitted From: SNF.  Disposition: SNF.   Recommendations for Outpatient Follow-up:  1. Follow up with PCP in 1-2 weeks 2. Please obtain BMP/CBC in one week   Discharge Condition: Guarded  CODE STATUS: full code.  Diet recommendation: Heart Healthy  Brief/Interim Summary: 73 y.o.femalewith past medical history significant for dementia with Parkinson's disease, depression, decubitus ulcer getting care[facility]out[facility] wound care centerwho was referred from wound care center due to worsening wound.   Per son, patient was also recently diagnosed with DVT on her right lower extremity andwasstarted on anticoagulation. She was found to have a large sacral decubitus ulcer findings worrisome for osteomyelitis.  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  Continue IV antibiotics IV zosyn , stop date is 01/06/2019.  Underwent debridement by general surgery, as per surgery hydrotherapy can be discontinued.  Continue dressing changes as per recommendations. Blood cultures have been negative so far.   Volume overload secondary to volume resuscitation in the setting of shock. Echocardiogram reviewed.  Last CXR from 2/13, with some mild pulm edema. Has been diuresed appropriately with lasix.    Right lower extremity  DVT On IV heparin transitioned to DOAC.   Acute blood loss anemia possibly from debridement. She underwent 1 unit of PRBC transfusion on 2/6.  Hemoglobin stable  Between 8 to 9.  Anemia panel reviewed.   Hypophosphatemia Replaced   Hypomagnesemia and hypokalemia Replaced.     Coag negative staph in 1 of 4 blood culture bottles Probably a contaminant. Negative so far.    History of Parkinson's disease/dementia Continue with Sinemet and avoid sedatives at this time.  Toxic metabolic encephalopathy improving.pt appears to be back to baseline.    Moderate protein calorie malnutrition Supplementations as recommended by nutritionist  Discharge Instructions  Discharge Instructions    Diet - low sodium heart healthy   Complete by:  As directed    Diet - low sodium heart healthy   Complete by:  As directed    Discharge instructions   Complete by:  As directed    Please follow up with wound care as recommended by surgery.  Please follow up with PCP in one week.  Please check cbc and bmp in one week.   Discharge instructions   Complete by:  As directed    Please follow up with PCP in one week.     Allergies as of 12/15/2018      Reactions   Sinemet [carbidopa W-levodopa] Nausea And Vomiting   Able to tolerate with Zofran   Sulfa Antibiotics Other (See Comments)   Flushing and hot      Medication List    STOP taking these medications   ciprofloxacin 500 MG tablet Commonly known as:  CIPRO   fluconazole 100 MG tablet Commonly known as:  DIFLUCAN     TAKE these medications   ALPRAZolam 0.5 MG tablet Commonly known as:  XANAX Take 1 tablet (0.5 mg total) by mouth 3 (three) times daily as needed for anxiety.   apixaban 5 MG Tabs tablet Commonly known as:  ELIQUIS Take 1 tablet (5 mg total) by mouth 2 (two) times daily. What changed:  how much to take   carbidopa-levodopa 25-100 MG tablet Commonly known as:  SINEMET IR Take 0.5 tablets by mouth  3 (three) times daily.   lansoprazole 30 MG capsule Commonly known as:  PREVACID Take 30 mg by mouth every morning.   midodrine 2.5 MG tablet Commonly known as:  PROAMATINE Take 1 tablet (2.5 mg total) by mouth 3 (three) times daily with meals.   ondansetron 4 MG tablet Commonly known as:  ZOFRAN Take 1 tablet (4 mg total) by mouth every 8 (eight) hours as needed for nausea or vomiting. What changed:    when to take this  additional instructions   ONE-A-DAY WOMENS 50 PLUS PO Take 1 tablet by mouth daily.   oxyCODONE-acetaminophen 5-325 MG tablet Commonly known as:  PERCOCET/ROXICET Take 1-2 tablets by mouth every 6 (six) hours as needed for severe pain.   piperacillin-tazobactam 3.375 GM/50ML IVPB Commonly known as:  ZOSYN Inject 50 mLs (3.375 g total) into the vein every 8 (eight) hours for 26 days.   RESOURCE 2.0 Liqd Take 120 mLs by mouth 2 (two) times daily.   NUTRITIONAL SUPPLEMENTS PO Take 1 Units by mouth daily at 12 noon. "Magic Cup."   silver nitrate applicators 75-25 % applicator Apply 10 Sticks (10 application total) topically once as needed for bleeding (bleeding from sacral wound).   traMADol 50 MG tablet Commonly known as:  ULTRAM Take 50 mg by mouth every 6 (six) hours as needed for moderate pain.       Contact information for follow-up providers    Richmond Campbell., PA-C. Schedule an appointment as soon as possible for a visit in 1 week(s).   Specialty:  Family Medicine Contact information: [address] [place] [address] [phone]            Contact information for after-discharge care    Destination    [facility]erred [facility] .   Service:  Skilled Nursing Contact information: [address] [place] [address] [phone]                 Allergies  Allergen Reactions  . Sinemet [Carbidopa W-Levodopa] Nausea And Vomiting    Able to tolerate with Zofran  . Sulfa Antibiotics Other (See  Comments)    Flushing and hot    Consultations: GENERAL SURGERY PCCM.  ID   Procedures/Studies: Dg Chest 1 View  Result Date: 11/28/2018 CLINICAL DATA:  Hypoxia. EXAM: CHEST  1 VIEW COMPARISON:  11/26/2018. FINDINGS: Stable poor inspiration and grossly normal sized heart. Slight increase in diffuse prominence of the interstitial markings. Interval minimal right pleural effusion. Diffuse osteopenia and mild thoracic spine degenerative changes. Minimal right shoulder degenerative changes. IMPRESSION: Interval minimal changes of congestive heart failure superimposed on chronic interstitial lung disease. Electronically Signed   By: Beckie Salts M.D.   On: 11/28/2018 15:10   Dg Sacrum/coccyx  Result Date: 11/26/2018 CLINICAL DATA:  Sacral decubitus ulcer. EXAM: SACRUM AND COCCYX - 2+ VIEW COMPARISON:  CT abdomen and pelvis 04/19/2016. FINDINGS: The inferior aspect of the sacrum appears exposed to air. Technique is limited but there appears to be bony destructive change in the underlying distal sacrum worrisome for osteomyelitis. No radiopaque foreign body is identified. IMPRESSION:  Large appearing sacral decubitus ulcer with findings worrisome for osteomyelitis in the distal sacrum. MRI of the sacrum with contrast is the best test for further evaluation. Electronically Signed   By: Drusilla Kannerhomas  Dalessio M.D.   On: 11/26/2018 12:58   Dg Abd 1 View  Result Date: 11/26/2018 CLINICAL DATA:  Constipation EXAM: ABDOMEN - 1 VIEW COMPARISON:  08/08/2018 FINDINGS: Moderately dilated colon which is gas-filled. Small bowel nondilated. Surgical clips right upper quadrant. Left renal calculus unchanged. IMPRESSION: Colonic ileus Electronically Signed   By: Marlan Palauharles  Clark M.D.   On: 11/26/2018 16:42   Ct Head Wo Contrast  Result Date: 11/26/2018 CLINICAL DATA:  Encephalopathy EXAM: CT HEAD WITHOUT CONTRAST TECHNIQUE: Contiguous axial images were obtained from the base of the skull through the vertex without  intravenous contrast. Sagittal and coronal MPR images reconstructed from axial data set. COMPARISON:  11/02/2018 Correlation: MR brain 11/02/2018. FINDINGS: Brain: Normal ventricular morphology. No midline shift or mass effect. Normal appearance of brain parenchyma. No intracranial hemorrhage, mass lesion or evidence of acute infarction. Minimal chronic prominence of the extra-axial spaces bilaterally over the convexities correspond to minimal chronic subdural collections likely hygromas, not significantly changed. No new extra-axial collections. Vascular: Unremarkable Skull: Intact though mildly demineralized. Sinuses/Orbits: Clear Other: N/A IMPRESSION: No acute intracranial abnormalities. 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 Electronically Signed   By: Ulyses SouthwardMark  Boles M.D.   On: 11/26/2018 16:23   Mr Sacrum Si Joints W Wo Contrast  Result Date: 11/28/2018 CLINICAL DATA:  Open sacral decubitus ulcer. EXAM: MRI PELVIS WITHOUT AND WITH CONTRAST TECHNIQUE: Multiplanar multisequence MR imaging of the pelvis was performed both before and after administration of intravenous contrast. CONTRAST:  6.5 cc Gadavist COMPARISON:  11/26/2018 FINDINGS: Urinary Tract:  Decompressed urinary bladder. Bowel: Stool burden within the rectum. No bowel obstruction or inflammation. Vascular/Lymphatic: Negative Reproductive:  Negative Other: There is an approximately 7.4 x 2.2 x 6.8 cm sacral decubitus ulcer to the left midline. Musculoskeletal: Abnormal marrow edema of the S5 segment of the sacrum and coccyx adjacent to the sacral decubitus ulcer consistent with osteomyelitis. Adjacent myositis and edema the gluteal musculature. Mild presacral edema is also noted. IMPRESSION: 1. Osteomyelitis of the lower sacrum and coccyx associated with a large sacral decubitus ulcer measuring 7.4 x 2.2 x 6.8 cm. 2. Diffuse gluteal edema with trace presacral edema compatible with changes  of myositis. No pyomyositis is identified. Electronically Signed   By: Tollie Ethavid  Kwon M.D.   On: 11/28/2018 23:16   Dg Chest Port 1 View  Result Date: 12/11/2018 CLINICAL DATA:  Weakness EXAM: PORTABLE CHEST 1 VIEW COMPARISON:  Chest radiograph 12/05/2018 FINDINGS: Right upper extremity PICC line is present with tip projecting over the right atrium. Low lung volumes. Elevation right hemidiaphragm. Similar-appearing bilateral interstitial pulmonary opacities. More focal patchy consolidation within the right mid lung and left lower lung. Small bilateral pleural effusions. IMPRESSION: Cardiomegaly and interstitial opacities compatible with edema. Scattered patchy consolidative opacities may represent more focal edema. Infection not excluded. Electronically Signed   By: Annia Beltrew  Davis M.D.   On: 12/11/2018 14:25   Dg Chest Port 1 View  Result Date: 12/05/2018 CLINICAL DATA:  Fluid overload.  Cough. EXAM: PORTABLE CHEST 1 VIEW COMPARISON:  12/03/2018 FINDINGS: Right arm PICC tip in the proximal right atrium as seen previously. Interstitial and alveolar pulmonary density consistent with edema. Coexistent pneumonia is certainly possible. Small amount of pleural fluid noted on the right. IMPRESSION: Similar appearance to  the study of 2 days ago. Probable mild pulmonary edema, possibly with associated scattered infiltrates. Small right effusion. Electronically Signed   By: Paulina Fusi M.D.   On: 12/05/2018 14:23   Dg Chest Port 1 View  Result Date: 12/03/2018 CLINICAL DATA:  Respiratory failure EXAM: PORTABLE CHEST 1 VIEW COMPARISON:  November 28, 2018 FINDINGS: Central catheter tip is in the right atrium. No pneumothorax. There is a small right pleural effusion with atelectasis and suspected mild consolidation in the right base. There is patchy infiltrate in the left base as well. Subtle airspace opacity in the right upper lobe may represent a small focus of pneumonia or alveolar edema. Note that there is cardiomegaly  with pulmonary venous hypertension and mild interstitial pulmonary edema. There is aortic atherosclerosis. No bone lesions. IMPRESSION: Pulmonary vascular congestion with areas of interstitial pulmonary edema. Question alveolar edema right upper lobe versus pneumonia. Small right pleural effusion with right base atelectasis. Question edema versus patchy pneumonia in the lung bases currently. There is aortic atherosclerosis. Central catheter tip in right atrium without pneumothorax Electronically Signed   By: Bretta Bang III M.D.   On: 12/03/2018 07:47   Dg Chest Port 1 View  Result Date: 11/26/2018 CLINICAL DATA:  Leukocytosis, decubitus ulcer, shortness of breath EXAM: PORTABLE CHEST 1 VIEW COMPARISON:  11/02/2018 FINDINGS: Similar low lung volumes with chronic appearing bronchitic changes and interstitial lung disease as before. No definite superimposed pneumonia, collapse, edema, effusion or pneumothorax. Trachea midline. Aorta atherosclerotic. Degenerative changes of the spine. Bowel distension noted in the epigastric region. IMPRESSION: Similar low lung volumes and chronic interstitial lung disease. No significant interval change or superimposed acute process. Electronically Signed   By: Judie Petit.  Shick M.D.   On: 11/26/2018 16:27   [place] Ekg Site Rite  Result Date: 11/29/2018 If Site Rite image not attached, placement could not be confirmed due to current cardiac rhythm.     Subjective: No chest pain or sob  Pleasant and in good spirits.   Discharge Exam: Vitals:   12/15/18 0607 12/15/18 1319  BP: 110/63 (!) 99/52  Pulse: 96 91  Resp: 20 16  Temp: 97.8 F (36.6 C) 97.7 F (36.5 C)  SpO2: 98% 97%   Vitals:   12/14/18 2156 12/15/18 0300 12/15/18 0607 12/15/18 1319  BP: 125/71 107/64 110/63 (!) 99/52  Pulse: 98 91 96 91  Resp: 16 16 20 16   Temp: 97.7 F (36.5 C) 98.1 F (36.7 C) 97.8 F (36.6 C) 97.7 F (36.5 C)  TempSrc: Oral Oral Oral Oral  SpO2: 97% 91% 98% 97%  Weight:       Height:        General: Pt is alert, awake, not in acute distress Cardiovascular: RRR, S1/S2 +, no rubs, no gallops Respiratory: CTA bilaterally, no wheezing, no rhonchi Abdominal: Soft, NT, ND, bowel sounds + Extremities: no edema, no cyanosis    The results of significant diagnostics from this hospitalization (including imaging, microbiology, ancillary and laboratory) are listed below for reference.     Microbiology: No results found for this or any previous visit (from the past 240 hour(s)).   Labs: BNP (last 3 results) No results for input(s): BNP in the last 8760 hours. Basic Metabolic Panel: Recent Labs  Lab 12/09/18 0317 12/10/18 0245 12/11/18 0431  NA 142 142 142  K 2.9* 3.1* 3.7  CL 94* 91* 92*  CO2 38* 41* 44*  GLUCOSE 147* 159* 142*  BUN 11 14 17   CREATININE 0.47 0.48 0.49  CALCIUM 7.5* 7.4* 7.9*  MG 1.8 2.0  --    Liver Function Tests: No results for input(s): AST, ALT, ALKPHOS, BILITOT, PROT, ALBUMIN in the last 168 hours. No results for input(s): LIPASE, AMYLASE in the last 168 hours. No results for input(s): AMMONIA in the last 168 hours. CBC: Recent Labs  Lab 12/09/18 0317 12/10/18 0245 12/11/18 0431  WBC 4.5 3.4* 4.9  HGB 9.1* 8.6* 9.5*  HCT 30.0* 29.1* 31.3*  MCV 100.7* 100.7* 103.6*  PLT 121* 114* 117*   Cardiac Enzymes: No results for input(s): CKTOTAL, CKMB, CKMBINDEX, TROPONINI in the last 168 hours. BNP: Invalid input(s): POCBNP CBG: No results for input(s): GLUCAP in the last 168 hours. D-Dimer No results for input(s): DDIMER in the last 72 hours. Hgb A1c No results for input(s): HGBA1C in the last 72 hours. Lipid Profile No results for input(s): CHOL, HDL, LDLCALC, TRIG, CHOLHDL, LDLDIRECT in the last 72 hours. Thyroid function studies No results for input(s): TSH, T4TOTAL, T3FREE, THYROIDAB in the last 72 hours.  Invalid input(s): FREET3 Anemia work up No results for input(s): VITAMINB12, FOLATE, FERRITIN, TIBC, IRON,  RETICCTPCT in the last 72 hours. Urinalysis    Component Value Date/Time   COLORURINE STRAW (A) 12/13/2018 1356   APPEARANCEUR CLEAR 12/13/2018 1356   LABSPEC 1.006 12/13/2018 1356   PHURINE 9.0 (H) 12/13/2018 1356   GLUCOSEU NEGATIVE 12/13/2018 1356   HGBUR LARGE (A) 12/13/2018 1356   BILIRUBINUR NEGATIVE 12/13/2018 1356   KETONESUR NEGATIVE 12/13/2018 1356   PROTEINUR NEGATIVE 12/13/2018 1356   UROBILINOGEN 1.0 06/07/2014 2200   NITRITE NEGATIVE 12/13/2018 1356   LEUKOCYTESUR NEGATIVE 12/13/2018 1356   Sepsis Labs Invalid input(s): PROCALCITONIN,  WBC,  LACTICIDVEN Microbiology No results found for this or any previous visit (from the past 240 hour(s)).   Time coordinating discharge: 35  minutes  SIGNED:   Kathlen Mody, MD  [facility] 12/15/2018, 6:31 PM Pager   If 7PM-7AM, please contact night-coverage [email or web address] Password [email or web address]   Addendum No changes in management. Continue lasix while in hospital.  Awaiting PASSR for discharge.    Kathlen Mody, MD [phone]

## 2018-12-16 NOTE — Progress Notes (Signed)
Occupational Therapy Treatment Patient Details Name: Becky Figueroa MRN: [id number] DOB: Dec 22, 1945 Today's Date: 12/16/2018    History of present illness Becky Figueroa is an 73 y.o. female past medical history of Parkinson's disease, dementia, essential hypertension, admitted with Septic shock due to sacral osteomyelitis, unstageable sacral pressure injury    OT comments  Pt sat EOB with OT and PT this day and did comb hair. Pts family very encouraging  Follow Up Recommendations  SNF;Supervision/Assistance - 24 hour    Equipment Recommendations  Other (comment)(defer to next venue)    Recommendations for Other Services      Precautions / Restrictions Precautions Precautions: Fall Precaution Comments: incontinent; sacral wound Restrictions Weight Bearing Restrictions: No       Mobility Bed Mobility Overal bed mobility: Needs Assistance Bed Mobility: Rolling;Sidelying to Sit;Sit to Sidelying Rolling: Mod assist;Max assist;+2 for physical assistance Sidelying to sit: Mod assist;Max assist;+2 for physical assistance     Sit to sidelying: Max assist;+2 for physical assistance General bed mobility comments: patient able to roll to sidelying to sit EOB x ~6 minutes; requires up to Max A +2 for bed level mobility wtih cueing for hand placement and sequencing  Transfers                 General transfer comment: unable tpday    Balance Overall balance assessment: Needs assistance Sitting-balance support: Bilateral upper extremity supported;Feet supported Sitting balance-Leahy Scale: Poor Sitting balance - Comments: initially requiring Mod/Max support for sitting; able to progress to min guard/supervision Postural control: Posterior lean                                 ADL either performed or assessed with clinical judgement   ADL Overall ADL's : Needs assistance/impaired     Grooming: Minimal assistance;Sitting;Brushing hair                                  General ADL Comments: pt able to tolerate sitting EOB  requires encouragement     Vision Patient Visual Report: No change from baseline     Perception     Praxis      Cognition Arousal/Alertness: Awake/alert Behavior During Therapy: WFL for tasks assessed/performed Overall Cognitive Status: History of cognitive impairments - at baseline                                 General Comments: oriented to self, place, her son              General Comments family present and supportive    Pertinent Vitals/ Pain       Pain Assessment: Faces Faces Pain Scale: Hurts even more Pain Location: with movement; intermittent crying episode Pain Descriptors / Indicators: Discomfort;Grimacing;Crying Pain Intervention(s): Limited activity within patient's tolerance;Repositioned;Patient requesting pain meds-RN notified;Premedicated before session         Frequency  Min 2X/week        Progress Toward Goals  OT Goals(current goals can now be found in the care plan section)  Progress towards OT goals: Progressing toward goals  Acute Rehab OT Goals Patient Stated Goal: to get better  Plan Discharge plan remains appropriate    Co-evaluation    PT/OT/SLP Co-Evaluation/Treatment: Yes Reason for Co-Treatment: Complexity of the patient's impairments (multi-system  involvement);For patient/therapist safety;To address functional/ADL transfers PT goals addressed during session: Mobility/safety with mobility;Balance;Strengthening/ROM OT goals addressed during session: ADL's and self-care      AM-PAC OT "6 Clicks" Daily Activity     Outcome Measure   Help from another person eating meals?: A Little Help from another person taking care of personal grooming?: A Little Help from another person toileting, which includes using toliet, bedpan, or urinal?: Total Help from another person bathing (including washing, rinsing, drying)?:  Total Help from another person to put on and taking off regular upper body clothing?: A Lot Help from another person to put on and taking off regular lower body clothing?: Total 6 Click Score: 11    End of Session Equipment Utilized During Treatment: Oxygen  OT Visit Diagnosis: Muscle weakness (generalized) (M62.81);Other abnormalities of gait and mobility (R26.89)   Activity Tolerance Patient tolerated treatment well;Patient limited by pain   Patient Left in bed;with call bell/phone within reach;with bed alarm set;with family/visitor present   Nurse Communication Mobility status        Time: 5520-8022 OT Time Calculation (min): 22 min  Charges: OT General Charges $OT Visit: 1 Visit OT Treatments $Self Care/Home Management : 8-22 mins  Lise Auer, OT Acute Rehabilitation Services [phone] Office- [phone]      Ralston Venus, Karin Golden D 12/16/2018, 12:04 PM

## 2018-12-16 NOTE — Clinical Social Work Placement (Signed)
Pt received approved PASRR [id number] E. Admitting to [facility] room 604-P, [phone] for report Family at bedside Arranged PT[facility] transportation   CLINICAL SOCIAL WORK PLACEMENT  NOTE  Date:  12/16/2018  Patient Details  Name: Becky Figueroa MRN: [id number] Date of Birth: May 22, 1946  Clinical Social Work is seeking post-discharge placement for this patient at the Skilled  Nursing Facility level of care (*CSW will initial, date and re-position this form in  chart as items are completed):  Yes   Patient/family provided with [facility] Clinical Social Work Department's list of facilities offering this level of care within the geographic area requested by the patient (or if unable, by the patient's family).  Yes   Patient/family informed of their freedom to choose among providers that offer the needed level of care, that participate in Medicare, Medicaid or managed care program needed by the patient, have an available bed and are willing to accept the patient.  Yes   Patient/family informed of [facility]'s ownership interest in [facility] and [facility], as well as of the fact that they are under no obligation to receive care at these facilities.  PASRR submitted to EDS on 12/11/18     PASRR number received on 12/22/18     Existing PASRR number confirmed on       FL2 transmitted to all facilities in geographic area requested by pt/family on 12/11/18     FL2 transmitted to all facilities within larger geographic area on       Patient informed that his/her managed care company has contracts with or will negotiate with certain facilities, including the following:        Yes   Patient/family informed of bed offers received.  Patient chooses bed at [facility]     Physician recommends and patient chooses bed at [facility]    Patient to be transferred to [facility] on 12/16/18.  Patient to be transferred to facility by PTAR     Patient family notified on  12/16/18 of transfer.  Name of family member notified:        PHYSICIAN       Additional Comment:    _______________________________________________ Nelwyn Salisbury, LCSW 12/16/2018, 1:10 PM

## 2018-12-16 NOTE — Progress Notes (Signed)
Physical Therapy Treatment Patient Details Name: Becky Figueroa MRN: [id number] DOB: 14-Dec-1945 Today's Date: 12/16/2018    History of Present Illness Becky Figueroa is an 73 y.o. female past medical history of Parkinson's disease, dementia, essential hypertension, admitted with Septic shock due to sacral osteomyelitis, unstageable sacral pressure injury     PT Comments    Patient seen to attempt mobility progression. Patient requiring cueing and physical assist for bed level mobility for safety and sequencing. Able to reach across for bed rails, but does require trunk support to come into sitting upright. Once in sitting able to progress balance to SUP/min guard with UE support.   Patient with noted preferred ankle plantarflexion with concern for B ankle contractures. PT requesting PRAFO boots - PT educated nursing and family members on application and usage with good understanding. PT to continue to follow.     Follow Up Recommendations  SNF     Equipment Recommendations  None recommended by PT    Recommendations for Other Services       Precautions / Restrictions Precautions Precautions: Fall Precaution Comments: incontinent; sacral wound Restrictions Weight Bearing Restrictions: No    Mobility  Bed Mobility Overal bed mobility: Needs Assistance Bed Mobility: Rolling;Sidelying to Sit;Sit to Sidelying Rolling: Mod assist;Max assist;+2 for physical assistance Sidelying to sit: Mod assist;Max assist;+2 for physical assistance     Sit to sidelying: Max assist;+2 for physical assistance General bed mobility comments: patient able to roll to sidelying to sit EOB x ~6 minutes; requires up to Max A +2 for bed level mobility wtih cueing for hand placement and sequencing  Transfers                 General transfer comment: unable tpday  Ambulation/Gait                 Stairs             Wheelchair Mobility    Modified Rankin (Stroke  Patients Only)       Balance Overall balance assessment: Needs assistance Sitting-balance support: Bilateral upper extremity supported;Feet supported Sitting balance-Leahy Scale: Poor Sitting balance - Comments: initially requiring Mod/Max support for sitting; able to progress to min guard/supervision Postural control: Posterior lean                                  Cognition Arousal/Alertness: Awake/alert Behavior During Therapy: WFL for tasks assessed/performed Overall Cognitive Status: History of cognitive impairments - at baseline                                 General Comments: oriented to self, place, her son      Exercises      General Comments General comments (skin integrity, edema, etc.): family present and supportive      Pertinent Vitals/Pain Pain Assessment: Faces Faces Pain Scale: Hurts even more Pain Location: with movement; intermittent crying episode Pain Descriptors / Indicators: Discomfort;Grimacing;Crying Pain Intervention(s): Limited activity within patient's tolerance;Monitored during session;Repositioned;Premedicated before session    Home Living                      Prior Function            PT Goals (current goals can now be found in the care plan section) Acute Rehab PT Goals Patient Stated Goal: to  get better PT Goal Formulation: With patient Time For Goal Achievement: 12/22/18 Potential to Achieve Goals: Fair Progress towards PT goals: Progressing toward goals    Frequency    Min 2X/week      PT Plan Current plan remains appropriate    Co-evaluation PT/OT/SLP Co-Evaluation/Treatment: Yes Reason for Co-Treatment: Complexity of the patient's impairments (multi-system involvement);For patient/therapist safety;To address functional/ADL transfers PT goals addressed during session: Mobility/safety with mobility;Balance;Strengthening/ROM        AM-PAC PT "6 Clicks" Mobility   Outcome  Measure  Help needed turning from your back to your side while in a flat bed without using bedrails?: A Lot Help needed moving from lying on your back to sitting on the side of a flat bed without using bedrails?: A Lot Help needed moving to and from a bed to a chair (including a wheelchair)?: Total Help needed standing up from a chair using your arms (e.g., wheelchair or bedside chair)?: Total Help needed to walk in hospital room?: Total Help needed climbing 3-5 steps with a railing? : Total 6 Click Score: 8    End of Session Equipment Utilized During Treatment: Oxygen Activity Tolerance: Patient tolerated treatment well;Patient limited by fatigue Patient left: in bed;with call bell/phone within reach;with family/visitor present Nurse Communication: Mobility status PT Visit Diagnosis: Unsteadiness on feet (R26.81);Difficulty in walking, not elsewhere classified (R26.2)     Time: 1287-8676 PT Time Calculation (min) (ACUTE ONLY): 33 min  Charges:  $Therapeutic Activity: 8-22 mins                      Kipp Laurence, PT, DPT Supplemental Physical Therapist 12/16/18 11:37 AM Pager: [phone] Office: [phone]

## 2018-12-19 ENCOUNTER — Ambulatory Visit: Payer: Medicare Other | Admitting: Neurology

## 2018-12-25 ENCOUNTER — Ambulatory Visit: Payer: Medicare Other | Admitting: Neurology

## 2018-12-25 ENCOUNTER — Encounter: Payer: Self-pay | Admitting: Neurology

## 2018-12-25 ENCOUNTER — Ambulatory Visit (INDEPENDENT_AMBULATORY_CARE_PROVIDER_SITE_OTHER): Payer: Medicare Other | Admitting: Neurology

## 2018-12-25 VITALS — BP 105/70 | HR 87 | Ht 65.0 in

## 2018-12-25 DIAGNOSIS — G20 Parkinson's disease: Secondary | ICD-10-CM | POA: Diagnosis not present

## 2018-12-25 MED ORDER — CARBIDOPA-LEVODOPA 25-100 MG PO TABS: 1.0000 | ORAL_TABLET | Freq: Three times a day (TID) | ORAL | 1 refills | Status: AC

## 2018-12-25 NOTE — Progress Notes (Signed)
PATIENT: Becky Figueroa DOB: [birth year]  REASON FOR VISIT: follow up HISTORY FROM: patient  HISTORY OF PRESENT ILLNESS: Today 12/25/18  Becky Figueroa is a 73 year old female who presents for follow-up for Parkinson's disease.  She is currently residing at [facility] rehabilitation center.  She had surgery 3 weeks ago to remove 3 cm of her tailbone after she developed osteomyelitis and sepsis from a sacral pressure ulcer.  She is currently going through a divorce and she presents today with her son who is her power of attorney, and her daughter.  She is currently taking Sinemet 25-100 mg, half tablet 3 times a day.  In the past she has been unable to tolerate a full tablet due to nausea.  Her family reports they have seen a good benefit with the Sinemet.  They have noticed that she has more energy and is able to feed herself by using silverware.  She is currently doing physical therapy to improve her strength and she is working on standing with a walker.  She is currently in a wheelchair today.  She has a Foley catheter, PICC line, and wearing Unna boots.  She reports some trouble sleeping and feeling very anxious at bedtime when her children have to leave.  She also complains of pain to her sacrum.  Her family reports that they feel she becomes more agitated before her next Sinemet dose is due.  In January 2020 she was started on low-dose Seroquel taking 25 mg at night for hallucinations and delusional thinking.  She is no longer taking this as her family reports that Seroquel made this worse and made her start chanting.  She reports feeling depressed, anxious.  She reports a resting tremor to bilateral hands.  She presents today for follow-up   HISTORY Dr. Anne Hahn 09/18/2018 Becky Figueroa is a 72 year old right-handed white female with a history of a tremor that is been present for several years.  She has a brother and a sister and another family member with tremor as well.  However, the  patient has had some progressive changes in her ability to walk over the last year or 2.  The patient has had occasional falls, her activities of daily living have slowed down dramatically, she is now requiring assistance with bathing and dressing.  She does not use a cane or a walker for ambulation.  The patient has also developed a memory problem that has become more acute over the last year, particularly over the last 6 months.  She was seen previously by Dr. Frances Furbish, the patient was placed on Sinemet but she could not tolerate the medication due to nausea and had to stop the drug.  The patient was taking a full tablet 3 times daily.  The patient has not gone on any medication for memory or for Parkinson's disease, these issues have progressed over time.  The patient is still operating motor vehicle on occasion but only going for short distances.  The patient has severe short-term memory problems, she repeats herself frequently.  The patient can no longer keep up with her medications or appointments, she has required assistance over the last 6 months.  The patient has also had some bowel and bladder incontinence over the last 6 months.  The patient does not do the finances, her husband does this.  She denies any numbness, she denies headaches or dizziness.  She comes to this office for an evaluation.   REVIEW OF SYSTEMS: Out of a complete 14 system  review of symptoms, the patient complains only of the following symptoms, and all other reviewed systems are negative.  ALLERGIES: Allergies  Allergen Reactions  . Sinemet [Carbidopa W-Levodopa] Nausea And Vomiting    Able to tolerate with Zofran  . Sulfa Antibiotics Other (See Comments)    Flushing and hot    HOME MEDICATIONS: Outpatient Medications Prior to Visit  Medication Sig Dispense Refill  . acetaminophen (TYLENOL) 500 MG tablet Take 1,000 mg by mouth 3 (three) times daily.    Marland Kitchen ALPRAZolam (XANAX) 0.5 MG tablet Take 1 tablet (0.5 mg total) by  mouth 3 (three) times daily as needed for anxiety. 3 tablet 0  . Amino Acids-Protein Hydrolys (FEEDING SUPPLEMENT, PRO-STAT SUGAR FREE 64,) LIQD Take 30 mLs by mouth at bedtime.    Marland Kitchen apixaban (ELIQUIS) 5 MG TABS tablet Take 1 tablet (5 mg total) by mouth 2 (two) times daily. 60 tablet 0  . b complex vitamins tablet Take 1 tablet by mouth daily.    . ferrous sulfate 325 (65 FE) MG tablet Take 325 mg by mouth every other day.    . lansoprazole (PREVACID) 30 MG capsule Take 30 mg by mouth every morning.     . midodrine (PROAMATINE) 2.5 MG tablet Take 1 tablet (2.5 mg total) by mouth 3 (three) times daily with meals. 90 tablet 0  . Multiple Vitamins-Minerals (ONE-A-DAY WOMENS 50 PLUS PO) Take 1 tablet by mouth daily.    . ondansetron (ZOFRAN) 4 MG tablet Take 1 tablet (4 mg total) by mouth every 8 (eight) hours as needed for nausea or vomiting. (Patient taking differently: Take 4 mg by mouth every 8 (eight) hours. Takes with carbidopa-levodopa) 20 tablet 3  . oxycodone (OXY-IR) 5 MG capsule Take 2.5 mg by mouth every 8 (eight) hours. Stop date 01/01/2019    . oxyCODONE-acetaminophen (PERCOCET/ROXICET) 5-325 MG tablet Take 1-2 tablets by mouth every 6 (six) hours as needed for severe pain. 4 tablet 0  . piperacillin-tazobactam (ZOSYN) 3.375 GM/50ML IVPB Inject 50 mLs (3.375 g total) into the vein every 8 (eight) hours for 26 days. 50 mL   . silver nitrate applicators 75-25 % applicator Apply 10 Sticks (10 application total) topically once as needed for bleeding (bleeding from sacral wound). 100 each 0  . traMADol (ULTRAM) 50 MG tablet Take 50 mg by mouth as needed for moderate pain. Stop date 12/31/2018    . carbidopa-levodopa (SINEMET IR) 25-100 MG tablet Take 0.5 tablets by mouth 3 (three) times daily. 135 tablet 1  . Nutritional Supplements (RESOURCE 2.0) LIQD Take 120 mLs by mouth 2 (two) times daily.    Marland Kitchen NUTRITIONAL SUPPLEMENTS PO Take 1 Units by mouth daily at 12 noon. "Magic Cup."     No  facility-administered medications prior to visit.     PAST MEDICAL HISTORY: Past Medical History:  Diagnosis Date  . Anal stenosis   . Anxiety   . Decubitus ulcer   . 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 Depression   . Fatty liver   . GERD (gastroesophageal reflux disease)   . Hiatal hernia   . History of anal fissures   . History of chronic constipation   . History of kidney stones    1973  . Hyperlipidemia   . Left ureteral calculus   . Parkinson's disease (HCC)   . Wears glasses     PAST SURGICAL HISTORY: Past Surgical History:  Procedure Laterality Date  . ANAL FISSURECTOMY  1994   and Hemorrhoidectomy  .  BOTOX INJECTION N/A 11/18/2013   Procedure: BOTOX INJECTION;  Surgeon: Hart Carwinora M Brodie, MD;  Location: [facility];  Service: Endoscopy;  Laterality: N/A;  . CHOLECYSTECTOMY OPEN  1982  . CYSTOSCOPY WITH RETROGRADE PYELOGRAM, URETEROSCOPY AND STENT PLACEMENT Left 06/06/2016   Procedure: CYSTOSCOPY WITH RETROGRADE PYELOGRAM, URETEROSCOPY AND STENT PLACEMENT;  Surgeon: Barron Alvineavid Grapey, MD;  [facility] [place];  Service: Urology;  Laterality: Left;  . FLEXIBLE SIGMOIDOSCOPY N/A 11/18/2013   Procedure: FLEXIBLE SIGMOIDOSCOPY/ with BOTOX;  Surgeon: Hart Carwinora M Brodie, MD;  Location: [facility];  Service: Endoscopy;  Laterality: N/A;  . HOLMIUM LASER APPLICATION Left 06/06/2016   Procedure: HOLMIUM LASER APPLICATION;  Surgeon: Barron Alvineavid Grapey, MD;  [facility] [place];  Service: Urology;  Laterality: Left;  . IRRIGATION AND DEBRIDEMENT BUTTOCKS N/A 12/02/2018   Procedure: IRRIGATION AND DEBRIDEMENT SACRAL DECUBITUS;  Surgeon: Karie SodaGross, Steven, MD;  Location: [facility];  Service: General;  Laterality: N/A;  . TONSILLECTOMY  age 73  . TOTAL ABDOMINAL HYSTERECTOMY W/ BILATERAL SALPINGOOPHORECTOMY  1984   and Appendectomy    FAMILY HISTORY: Family History  Problem Relation Age of Onset  . Breast cancer Maternal Aunt        x 4  . Pancreatic  cancer Maternal Uncle 56  . Diabetes Brother   . Colon cancer Neg Hx     SOCIAL HISTORY: Social History   Socioeconomic History  . Marital status: Legally Separated    Spouse name: Not on file  . Number of children: 4  . Years of education: Not on file  . Highest education level: Not on file  Occupational History  . Occupation: Engineer, civil (consulting)nurse    Comment: retired  Engineer, productionocial Needs  . Financial resource strain: Not on file  . Food insecurity:    Worry: Not on file    Inability: Not on file  . Transportation needs:    Medical: Not on file    Non-medical: Not on file  Tobacco Use  . Smoking status: Never Smoker  . Smokeless tobacco: Never Used  Substance and Sexual Activity  . Alcohol use: No  . Drug use: No  . Sexual activity: Not on file  Lifestyle  . Physical activity:    Days per week: Not on file    Minutes per session: Not on file  . Stress: Not on file  Relationships  . Social connections:    Talks on phone: Not on file    Gets together: Not on file    Attends religious service: Not on file    Active member of club or organization: Not on file    Attends meetings of clubs or organizations: Not on file    Relationship status: Not on file  . Intimate partner violence:    Fear of current or ex partner: Not on file    Emotionally abused: Not on file    Physically abused: Not on file    Forced sexual activity: Not on file  Other Topics Concern  . Not on file  Social History Narrative  . Not on file      PHYSICAL EXAM  Vitals:   12/25/18 1044  BP: 105/70  Pulse: 87  Height: 5\' 5"  (1.651 m)   Body mass index is 27 kg/m.  Generalized: Well developed, in no acute distress  In a wheelchair, right PICC line, Foley catheter, Unna boots Neurological examination  Mentation: Alert oriented to time, place, history taking. Follows all commands speech and language fluent Cranial nerve II-XII: Pupils  were equal round reactive to light. Extraocular movements were full,  visual field were full on confrontational test. Facial sensation and strength were normal. Uvula tongue midline. Head turning and shoulder shrug were normal and symmetric. Motor: The motor testing reveals 3 over 5 strength of right and left arm. Difficulty moving her legs due to unna boot, any movement causes pain to her sacrum.  Resting tremor to bilateral hands.  Sensory: Sensory testing is intact to soft touch on all 4 extremities. No evidence of extinction is noted.  Coordination: Cerebellar testing reveals some dysmetria with finger-nose-finger bilaterally, unable to perform heel-to-shin Gait and station: In a wheelchair, unable to stand.  Reflexes: Deep tendon reflexes are symmetric and decreased bilaterally  DIAGNOSTIC DATA (LABS, IMAGING, TESTING) - I reviewed patient records, labs, notes, testing and imaging myself where available.  Lab Results  Component Value Date   WBC 4.9 12/11/2018   HGB 9.5 (L) 12/11/2018   HCT 31.3 (L) 12/11/2018   MCV 103.6 (H) 12/11/2018   PLT 117 (L) 12/11/2018      Component Value Date/Time   NA 142 12/11/2018 0431   K 3.7 12/11/2018 0431   CL 92 (L) 12/11/2018 0431   CO2 44 (H) 12/11/2018 0431   GLUCOSE 142 (H) 12/11/2018 0431   BUN 17 12/11/2018 0431   CREATININE 0.49 12/11/2018 0431   CALCIUM 7.9 (L) 12/11/2018 0431   PROT 5.0 (L) 12/04/2018 0358   ALBUMIN 3.0 (L) 12/04/2018 0358   AST 24 12/04/2018 0358   ALT 20 12/04/2018 0358   ALKPHOS 102 12/04/2018 0358   BILITOT 0.4 12/04/2018 0358   GFRNONAA >60 12/11/2018 0431   GFRAA >60 12/11/2018 0431   No results found for: CHOL, HDL, LDLCALC, LDLDIRECT, TRIG, CHOLHDL No results found for: WUJW1X Lab Results  Component Value Date   VITAMINB12 317 12/08/2018   Lab Results  Component Value Date   TSH 2.114 11/28/2018      ASSESSMENT AND PLAN 72 y.o. 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 here with:  1.  Parkinson's disease 2.  Gait disturbance  Overall the patient has had a significant decline since her last visit in November 2019.  She underwent surgery for a sacral wound that resulted in osteomyelitis and sepsis.  She has significant stress in her life as she is going through a divorce.  She is currently living in a rehabilitation center.  She is not able to ambulate, is working with physical therapy to stand.  She has a PICC line and Foley catheter.  She is taking Sinemet 25-100 mg tablets half a tablet 3 times a day.  Her family is requesting to increase her Sinemet as they have seen an improvement in her ability to feed herself and her energy level.  I talked with Dr. Anne Hahn and we will increase slowly.  In the past she has not been able to tolerate full dose Sinemet as result of nausea.  She will start by taking Sinemet 25-100 mg 1 whole tablet in the morning, half tablet at lunch, half tablet at dinner for 1 week.  She will then take a whole tablet for breakfast and lunch with 1/2 tablet at bedtime for 1 week.  If she is able  to tolerate the increase she will then be able to take a whole tablet 3 times a day.  I wrote this out on the nursing home progress note as well as provided a printed prescription with the family.  She also reports feeling depressed and was tearful during our visit today.  We may need to consider medication for depression, but I am hesitant to change more than 1 thing at a time.  I reviewed her med list and she has PRN Xanax at bedtime as needed and I suggested she ask for this if she needs this for her anxiety .  She will follow-up in this office in 3 months with Dr. Anne Hahn.     Margie Ege, AGNP-C,  DNP 12/25/2018, 1:20 PM [facility] [address], [address] [place], [place] [address] [phone]

## 2018-12-25 NOTE — Progress Notes (Signed)
I have read the note, and I agree with the clinical assessment and plan.  Shakerria Parran K Anastasya Jewell   

## 2019-02-13 IMAGING — CR DG ABDOMEN 1V
1 series · 1 of 1 positions shown · non-contrast
Comparison: 08/08/2018

CLINICAL DATA: Constipation

EXAM:
ABDOMEN - 1 VIEW

[x abdomen supine]
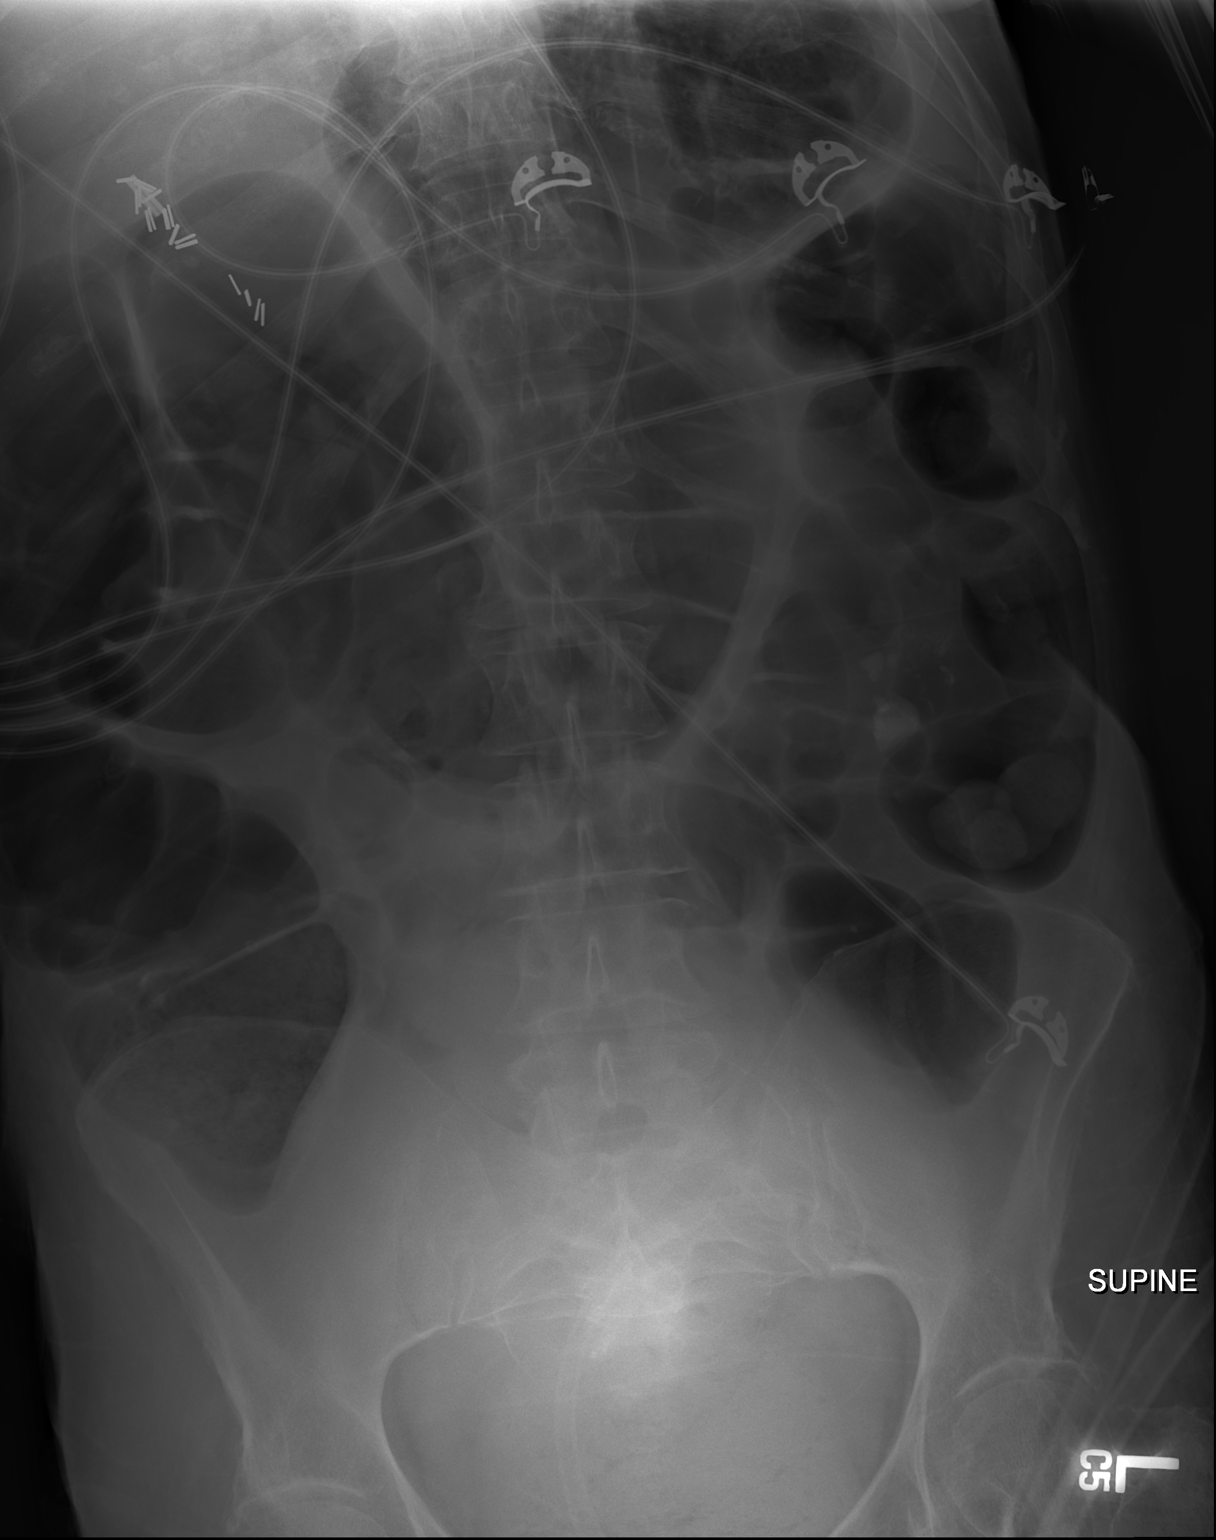

[1 of 1 positions shown; findings below may reference images not displayed]

FINDINGS: Moderately dilated colon which is gas-filled. Small bowel
nondilated. Surgical clips right upper quadrant. Left renal calculus
unchanged.
IMPRESSION: Colonic ileus

## 2019-03-25 ENCOUNTER — Ambulatory Visit: Payer: Medicare Other | Admitting: Neurology

## 2019-04-27 ENCOUNTER — Telehealth: Payer: Self-pay | Admitting: Neurology

## 2019-04-27 NOTE — Telephone Encounter (Signed)
Due to current COVID 19 pandemic, our office is severely reducing in office visits until further notice, in order to minimize the risk to our patients and healthcare providers.   I called patient's living facility, [facility], and left a message for the scheduler regarding patient's 7/2 appointment. Our office is currently not allowing any patients from assisted living facilities in for patient safety. I left this information in the voicemail and requested that the scheduler call our office back to reschedule patient's appointment for August.

## 2019-04-30 ENCOUNTER — Ambulatory Visit: Payer: Medicare Other | Admitting: Neurology

## 2019-07-13 ENCOUNTER — Telehealth: Payer: Self-pay

## 2019-07-13 NOTE — Telephone Encounter (Signed)
I sent email to [facility] for appt for pt.

## 2019-07-13 NOTE — Telephone Encounter (Signed)
Becky Figueroa from [facility] call about pt needing virtual video visit. She stated video will be best for the patient. Her email address is [email or web address] for link to be sent to. Her contact number is [phone]. I advise her to call our office once she receives the link.PT schedule with Judson Roch NP.

## 2019-07-30 ENCOUNTER — Non-Acute Institutional Stay: Payer: Self-pay | Admitting: Internal Medicine

## 2019-08-09 NOTE — Progress Notes (Signed)
Virtual Visit via Video Note  I connected with Becky Figueroa on 08/10/19 at  1:15 PM EDT by a video enabled telemedicine application and verified that I am speaking with the correct person using two identifiers.  Location: Patient: At her facility  Provider: In the office    I discussed the limitations of evaluation and management by telemedicine and the availability of in person appointments. The patient expressed understanding and agreed to proceed.  History of Present Illness: 08/10/2019 SS: Becky Figueroa is a 73 year old female with history of Parkinson's disease.  She remains at [facility].  She no longer has a PICC line or Foley catheter.  She indicates she is doing much better with the addition of Sinemet.  She says she has more strength and energy.  She is able to stand and pivot, stand and use the bathroom with 1 person assist.  She is mostly in a wheelchair.  She has not had recent fall.  She indicates her tremor is under good control, at times it may be worse when she is upset or agitated.  She indicates since last seen she is doing much better.  She is now divorced.  She has not been able to visit with her family, except through the window at her care facility.  She says she has some swelling to her left wrist a few weeks ago, is better after medication.  Her primary care doctor is Dr. Keenan Bachelor.  She denies any episodes of freezing.  She indicates she sleeps fairly well at night.  She has a good appetite.  She says she has gained 7 pounds.  She presents today for follow-up via virtual visit.  12/25/2018 SS: Becky Figueroa is a 73 year old female who presents for follow-up for Parkinson's disease.  She is currently residing at [facility] rehabilitation center.  She had surgery 3 weeks ago to remove 3 cm of her tailbone after she developed osteomyelitis and sepsis from a sacral pressure ulcer.  She is currently going through a divorce and she presents today with her son who  is her power of attorney, and her daughter.  She is currently taking Sinemet 25-100 mg, half tablet 3 times a day.  In the past she has been unable to tolerate a full tablet due to nausea.  Her family reports they have seen a good benefit with the Sinemet.  They have noticed that she has more energy and is able to feed herself by using silverware.  She is currently doing physical therapy to improve her strength and she is working on standing with a walker.  She is currently in a wheelchair today.  She has a Foley catheter, PICC line, and wearing Unna boots.  She reports some trouble sleeping and feeling very anxious at bedtime when her children have to leave.  She also complains of pain to her sacrum.  Her family reports that they feel she becomes more agitated before her next Sinemet dose is due.  In January 2020 she was started on low-dose Seroquel taking 25 mg at night for hallucinations and delusional thinking.  She is no longer taking this as her family reports that Seroquel made this worse and made her start chanting.  She reports feeling depressed, anxious.  She reports a resting tremor to bilateral hands.  She presents today for follow-up  HISTORY Dr. Jannifer Franklin 09/18/2018 Becky Figueroa is a 73 year old right-handed white female with a history of a tremor that is been present for several years.  She has a brother and a sister and another family member with tremor as well. However, the patient has had some progressive changes in her ability to walk over the last year or 2. The patient has had occasional falls, her activities of daily living have slowed down dramatically, she is now requiring assistance with bathing and dressing. She does not use a cane or a walker for ambulation. The patient has also developed a memory problem that has become more acute over the last year, particularlyover the last 6 months. She was seen previously by Dr. Jones Broom patient was placed on Sinemet but she could not  tolerate the medication due to nausea and had to stop the drug. The patient was taking a full tablet 3 times daily. The patient has not gone on any medication for memory or for Parkinson's disease, these issues have progressed over time. The patient is still operating motor vehicle on occasion but onlygoingforshort distances. The patient has severe short-term memory problems, she repeats herself frequently. The patient can no longer keep up with her medications or appointments, she has required assistance over the last 6 months. The patient has also had some bowel and bladder incontinence over the last 6 months. The patient does not do the finances, her husband does this. She denies any numbness, she denies headaches or dizziness. She comes to this office for an evaluation.    Observations/Objective: Via virtual visit, is alert, smiling, laughing, speech is clear, facial symmetry noted, at times jaw tremor is apparent, follows commands, difficulty raising left arm outstretched, resting tremor to right hand noted, left hand appears contracted, able to raise both legs while in the bed  Assessment and Plan: 1.  Parkinson's disease 2.  Gait disturbance  She appears to be doing much better since last seen. Today, she was smiling and laughing. The nursing staff indicates she has been doing well, no problems.  She continues to reside at [facility].  She no longer has a Foley catheter or PICC line. She indicates she has more energy and strength.  Her anxiety and depression is under better control.  She will continue taking Sinemet 25-100 mg, 1 tablet 3 times a day.  This has been beneficial for her.  At her next office visit, we may consider the addition of Namenda for her memory. She indicates her appetite has been well, and she has gained 7 lbs. She is not having any nausea with the Sinemet. She will follow-up in 6 months for an office visit with Dr. Anne Hahn or sooner if needed.  Follow Up  Instructions: 02/10/2020 9:00   I discussed the assessment and treatment plan with the patient. The patient was provided an opportunity to ask questions and all were answered. The patient agreed with the plan and demonstrated an understanding of the instructions.   The patient was advised to call back or seek an in-person evaluation if the symptoms worsen or if the condition fails to improve as anticipated.  I provided 15 minutes of non-face-to-face time during this encounter.  Otila Kluver, DNP  [facility] [address] [place], [place] [address] [phone]

## 2019-08-10 ENCOUNTER — Other Ambulatory Visit: Payer: Self-pay

## 2019-08-10 ENCOUNTER — Encounter: Payer: Self-pay | Admitting: Neurology

## 2019-08-10 ENCOUNTER — Telehealth (INDEPENDENT_AMBULATORY_CARE_PROVIDER_SITE_OTHER): Payer: Medicare Other | Admitting: Neurology

## 2019-08-10 DIAGNOSIS — G20 Parkinson's disease: Secondary | ICD-10-CM | POA: Diagnosis not present

## 2019-08-10 NOTE — Progress Notes (Signed)
I have read the note, and I agree with the clinical assessment and plan.  Shyane Fossum K Juan Olthoff   

## 2019-08-20 ENCOUNTER — Non-Acute Institutional Stay: Payer: Medicare Other | Admitting: Internal Medicine

## 2019-08-20 ENCOUNTER — Other Ambulatory Visit: Payer: Self-pay

## 2019-10-28 ENCOUNTER — Other Ambulatory Visit: Payer: Self-pay

## 2019-10-28 ENCOUNTER — Inpatient Hospital Stay (HOSPITAL_COMMUNITY)
Admission: EM | Admit: 2019-10-28 | Discharge: 2019-11-03 | DRG: 871 | Disposition: A | Discharge status: Skilled Nursing Facility | Payer: Medicare Other | Attending: Internal Medicine | Admitting: Internal Medicine

## 2019-10-28 ENCOUNTER — Encounter (HOSPITAL_COMMUNITY): Payer: Self-pay | Admitting: Emergency Medicine

## 2019-10-28 ENCOUNTER — Emergency Department (HOSPITAL_COMMUNITY): Payer: Medicare Other

## 2019-10-28 DIAGNOSIS — G20A1 Parkinson's disease without dyskinesia, without mention of fluctuations: Secondary | ICD-10-CM | POA: Diagnosis present

## 2019-10-28 DIAGNOSIS — Z882 Allergy status to sulfonamides status: Secondary | ICD-10-CM | POA: Diagnosis not present

## 2019-10-28 DIAGNOSIS — Z90710 Acquired absence of both cervix and uterus: Secondary | ICD-10-CM | POA: Diagnosis not present

## 2019-10-28 DIAGNOSIS — L89153 Pressure ulcer of sacral region, stage 3: Secondary | ICD-10-CM | POA: Diagnosis present

## 2019-10-28 DIAGNOSIS — Z90722 Acquired absence of ovaries, bilateral: Secondary | ICD-10-CM | POA: Diagnosis not present

## 2019-10-28 DIAGNOSIS — Z66 Do not resuscitate: Secondary | ICD-10-CM | POA: Diagnosis present

## 2019-10-28 DIAGNOSIS — Z86718 Personal history of other venous thrombosis and embolism: Secondary | ICD-10-CM | POA: Diagnosis not present

## 2019-10-28 DIAGNOSIS — E785 Hyperlipidemia, unspecified: Secondary | ICD-10-CM | POA: Diagnosis present

## 2019-10-28 DIAGNOSIS — R0902 Hypoxemia: Secondary | ICD-10-CM

## 2019-10-28 DIAGNOSIS — Z7901 Long term (current) use of anticoagulants: Secondary | ICD-10-CM

## 2019-10-28 DIAGNOSIS — K219 Gastro-esophageal reflux disease without esophagitis: Secondary | ICD-10-CM | POA: Diagnosis present

## 2019-10-28 DIAGNOSIS — F0280 Dementia in other diseases classified elsewhere without behavioral disturbance: Secondary | ICD-10-CM | POA: Diagnosis present

## 2019-10-28 DIAGNOSIS — J189 Pneumonia, unspecified organism: Secondary | ICD-10-CM

## 2019-10-28 DIAGNOSIS — R1312 Dysphagia, oropharyngeal phase: Secondary | ICD-10-CM | POA: Diagnosis present

## 2019-10-28 DIAGNOSIS — F419 Anxiety disorder, unspecified: Secondary | ICD-10-CM | POA: Diagnosis present

## 2019-10-28 DIAGNOSIS — J129 Viral pneumonia, unspecified: Secondary | ICD-10-CM | POA: Diagnosis present

## 2019-10-28 DIAGNOSIS — K5909 Other constipation: Secondary | ICD-10-CM | POA: Diagnosis present

## 2019-10-28 DIAGNOSIS — Z20822 Contact with and (suspected) exposure to covid-19: Secondary | ICD-10-CM | POA: Diagnosis present

## 2019-10-28 DIAGNOSIS — K760 Fatty (change of) liver, not elsewhere classified: Secondary | ICD-10-CM | POA: Diagnosis present

## 2019-10-28 DIAGNOSIS — Z888 Allergy status to other drugs, medicaments and biological substances status: Secondary | ICD-10-CM | POA: Diagnosis not present

## 2019-10-28 DIAGNOSIS — G20 Parkinson's disease: Secondary | ICD-10-CM | POA: Diagnosis present

## 2019-10-28 DIAGNOSIS — I82409 Acute embolism and thrombosis of unspecified deep veins of unspecified lower extremity: Secondary | ICD-10-CM

## 2019-10-28 DIAGNOSIS — F329 Major depressive disorder, single episode, unspecified: Secondary | ICD-10-CM | POA: Diagnosis present

## 2019-10-28 DIAGNOSIS — Z79899 Other long term (current) drug therapy: Secondary | ICD-10-CM

## 2019-10-28 DIAGNOSIS — Z87442 Personal history of urinary calculi: Secondary | ICD-10-CM

## 2019-10-28 DIAGNOSIS — R0682 Tachypnea, not elsewhere classified: Secondary | ICD-10-CM

## 2019-10-28 DIAGNOSIS — R0602 Shortness of breath: Secondary | ICD-10-CM | POA: Diagnosis not present

## 2019-10-28 DIAGNOSIS — A419 Sepsis, unspecified organism: Principal | ICD-10-CM | POA: Diagnosis present

## 2019-10-28 DIAGNOSIS — R000 Tachycardia, unspecified: Secondary | ICD-10-CM

## 2019-10-28 LAB — COMPREHENSIVE METABOLIC PANEL
ALT: 7 U/L (ref 0–44)
AST: 19 U/L (ref 15–41)
Albumin: 3.2 g/dL — ABNORMAL LOW (ref 3.5–5.0)
Alkaline Phosphatase: 123 U/L (ref 38–126)
Anion gap: 10 (ref 5–15)
BUN: 30 mg/dL — ABNORMAL HIGH (ref 8–23)
CO2: 31 mmol/L (ref 22–32)
Calcium: 8.9 mg/dL (ref 8.9–10.3)
Chloride: 100 mmol/L (ref 98–111)
Creatinine, Ser: 0.77 mg/dL (ref 0.44–1.00)
Glucose, Bld: 143 mg/dL — ABNORMAL HIGH (ref 70–99)
Potassium: 4.6 mmol/L (ref 3.5–5.1)
Sodium: 141 mmol/L (ref 135–145)
Total Bilirubin: 0.6 mg/dL (ref 0.3–1.2)
Total Protein: 7.4 g/dL (ref 6.5–8.1)

## 2019-10-28 LAB — CBC WITH DIFFERENTIAL/PLATELET
Abs Immature Granulocytes: 0.04 10*3/uL (ref 0.00–0.07)
Basophils Absolute: 0 10*3/uL (ref 0.0–0.1)
Basophils Relative: 0 %
Eosinophils Absolute: 0.1 10*3/uL (ref 0.0–0.5)
Eosinophils Relative: 1 %
HCT: 35.1 % — ABNORMAL LOW (ref 36.0–46.0)
Hemoglobin: 11.2 g/dL — ABNORMAL LOW (ref 12.0–15.0)
Immature Granulocytes: 0 %
Lymphocytes Relative: 14 %
Lymphs Abs: 1.4 10*3/uL (ref 0.7–4.0)
MCH: 32.7 pg (ref 26.0–34.0)
MCHC: 31.9 g/dL (ref 30.0–36.0)
MCV: 102.6 fL — ABNORMAL HIGH (ref 80.0–100.0)
Monocytes Absolute: 0.3 10*3/uL (ref 0.1–1.0)
Monocytes Relative: 4 %
Neutro Abs: 7.8 10*3/uL — ABNORMAL HIGH (ref 1.7–7.7)
Neutrophils Relative %: 81 %
Platelets: 203 10*3/uL (ref 150–400)
RBC: 3.42 MIL/uL — ABNORMAL LOW (ref 3.87–5.11)
RDW: 13.5 % (ref 11.5–15.5)
WBC: 9.7 10*3/uL (ref 4.0–10.5)
nRBC: 0 % (ref 0.0–0.2)

## 2019-10-28 LAB — URINALYSIS, ROUTINE W REFLEX MICROSCOPIC
Bilirubin Urine: NEGATIVE
Glucose, UA: NEGATIVE mg/dL
Ketones, ur: NEGATIVE mg/dL
Leukocytes,Ua: NEGATIVE
Nitrite: NEGATIVE
Protein, ur: 30 mg/dL — AB
RBC / HPF: >50 RBC/hpf — ABNORMAL HIGH (ref 0–5)
Specific Gravity, Urine: 1.018 (ref 1.005–1.030)
pH: 5 (ref 5.0–8.0)

## 2019-10-28 LAB — C-REACTIVE PROTEIN: CRP: 3.6 mg/dL — ABNORMAL HIGH (ref <1.0)

## 2019-10-28 LAB — D-DIMER, QUANTITATIVE: D-Dimer, Quant: 0.94 ug/mL-FEU — ABNORMAL HIGH (ref 0.00–0.50)

## 2019-10-28 LAB — TROPONIN I (HIGH SENSITIVITY)
Troponin I (High Sensitivity): <2 ng/L (ref <18)
Troponin I (High Sensitivity): 2 ng/L (ref <18)

## 2019-10-28 LAB — SARS CORONAVIRUS 2 (TAT 6-24 HRS): SARS Coronavirus 2: NEGATIVE

## 2019-10-28 LAB — LACTIC ACID, PLASMA: Lactic Acid, Venous: 1.8 mmol/L (ref 0.5–1.9)

## 2019-10-28 LAB — FIBRINOGEN: Fibrinogen: 569 mg/dL — ABNORMAL HIGH (ref 210–475)

## 2019-10-28 LAB — LACTATE DEHYDROGENASE: LDH: 116 U/L (ref 98–192)

## 2019-10-28 LAB — FERRITIN: Ferritin: 125 ng/mL (ref 11–307)

## 2019-10-28 LAB — PROCALCITONIN: Procalcitonin: <0.1 ng/mL

## 2019-10-28 LAB — TRIGLYCERIDES: Triglycerides: 99 mg/dL (ref <150)

## 2019-10-28 LAB — POC SARS CORONAVIRUS 2 AG -  ED: SARS Coronavirus 2 Ag: NEGATIVE

## 2019-10-28 MED ORDER — STERILE WATER FOR INJECTION IJ SOLN
INTRAMUSCULAR | Status: AC
  Administered 2019-10-28: 10 mL
  Filled 2019-10-28: qty 10

## 2019-10-28 MED ORDER — SODIUM CHLORIDE 0.9 % IV SOLN
1.0000 g | Freq: Once | INTRAVENOUS | Status: AC
  Administered 2019-10-28: 1 g via INTRAVENOUS
  Filled 2019-10-28: qty 10

## 2019-10-28 MED ORDER — IOHEXOL 350 MG/ML SOLN
100.0000 mL | Freq: Once | INTRAVENOUS | Status: AC | PRN
  Administered 2019-10-28: 100 mL via INTRAVENOUS

## 2019-10-28 MED ORDER — ENOXAPARIN SODIUM 60 MG/0.6ML ~~LOC~~ SOLN
1.0000 mg/kg | Freq: Two times a day (BID) | SUBCUTANEOUS | Status: DC
  Administered 2019-10-28 – 2019-11-01 (×8): 60 mg via SUBCUTANEOUS
  Filled 2019-10-28 (×8): qty 0.6

## 2019-10-28 MED ORDER — METRONIDAZOLE IN NACL 5-0.79 MG/ML-% IV SOLN: 500.0000 mg | Freq: Three times a day (TID) | INTRAVENOUS | Status: DC

## 2019-10-28 MED ORDER — METRONIDAZOLE IN NACL 5-0.79 MG/ML-% IV SOLN
500.0000 mg | Freq: Three times a day (TID) | INTRAVENOUS | Status: DC
  Administered 2019-10-28 – 2019-10-30 (×5): 500 mg via INTRAVENOUS
  Filled 2019-10-28 (×5): qty 100

## 2019-10-28 MED ORDER — PANTOPRAZOLE SODIUM 40 MG IV SOLR
40.0000 mg | INTRAVENOUS | Status: DC
  Administered 2019-10-28 – 2019-10-31 (×4): 40 mg via INTRAVENOUS
  Filled 2019-10-28 (×4): qty 40

## 2019-10-28 MED ORDER — SODIUM CHLORIDE (PF) 0.9 % IJ SOLN
INTRAMUSCULAR | Status: AC
  Filled 2019-10-28: qty 50

## 2019-10-28 MED ORDER — LORAZEPAM 2 MG/ML IJ SOLN
0.5000 mg | INTRAMUSCULAR | Status: DC | PRN
  Administered 2019-10-28 – 2019-11-03 (×6): 0.5 mg via INTRAVENOUS
  Filled 2019-10-28 (×7): qty 1

## 2019-10-28 MED ORDER — SODIUM CHLORIDE 0.9 % IV SOLN
500.0000 mg | Freq: Once | INTRAVENOUS | Status: AC
  Administered 2019-10-28: 500 mg via INTRAVENOUS
  Filled 2019-10-28: qty 500

## 2019-10-28 NOTE — ED Triage Notes (Signed)
Arrives via EMS from [facility], finished abx for PNA this past weekend, nursing staff called out EMS for low O2 and respiratory distress. Patient at baseline, hx of dementia. Negative COVID test 12/22.

## 2019-10-28 NOTE — H&P (Signed)
History and Physical    Becky Juneauatricia P Tinkle ZOX:[id number] DOB: [birth year] DOA: 10/28/2019  PCP: Richmond CampbellKaplan, Kristen W., PA-C  [facility]  Chief Complaint: Hypoxia  HPI: Becky Figueroa is a 73 y.o. 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 Pt has underlying dementia and cannot provide her own history, only yelling constantly when seen. Family at bedside who states pt recently completed course of abx for PNA and has hx of dysphagia and concerns of aspiration several weeks leading up to ED visit. Pt presents from facility which reportedly has case of COVID outbreak. Per EDP, pt noted to be markedly sob and EMS called. On arrival, pt noted to have O2 sats in the low-80's, improved with 2LNC.   ED Course: In the ED, had CXR with findings initially suggestive of chronic interstitial changes. Follow up CT chest with findings worrisome for viral PNA. Initial COVID testing was neg. Repeat covid test ordered and is pending. Pt was started on empiric azithro and rocephin. Inflammatory markers were ordered and remain pending. Pt has since been weaned to room air. Hospitalist consulted for consideration for admission.  Review of Systems:  Review of Systems  Unable to perform ROS: Dementia    Past Medical History:  Diagnosis Date  . Anal stenosis   . Anxiety   . Decubitus ulcer   . 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 Depression   . Fatty liver   . GERD (gastroesophageal reflux disease)   . Hiatal hernia   . History of anal fissures   . History of chronic constipation   . History of kidney stones    1973  . Hyperlipidemia   . Left ureteral calculus   . Parkinson's disease (HCC)   . Wears glasses     Past Surgical History:  Procedure  Laterality Date  . ANAL FISSURECTOMY  1994   and Hemorrhoidectomy  . BOTOX INJECTION N/A 11/18/2013   Procedure: BOTOX INJECTION;  Surgeon: Hart Carwinora M Brodie, MD;  Location: [facility];  Service: Endoscopy;  Laterality: N/A;  . CHOLECYSTECTOMY OPEN  1982  . CYSTOSCOPY WITH RETROGRADE PYELOGRAM, URETEROSCOPY AND STENT PLACEMENT Left 06/06/2016   Procedure: CYSTOSCOPY WITH RETROGRADE PYELOGRAM, URETEROSCOPY AND STENT PLACEMENT;  Surgeon: Barron Alvineavid Grapey, MD;  [facility];  Service: Urology;  Laterality: Left;  . FLEXIBLE SIGMOIDOSCOPY N/A 11/18/2013   Procedure: FLEXIBLE SIGMOIDOSCOPY/ with BOTOX;  Surgeon: Hart Carwinora M Brodie, MD;  Location: [facility];  Service: Endoscopy;  Laterality: N/A;  . HOLMIUM LASER APPLICATION Left 06/06/2016   Procedure: HOLMIUM LASER APPLICATION;  Surgeon: Barron Alvineavid Grapey, MD;  [facility] ;  Service: Urology;  Laterality: Left;  . IRRIGATION AND DEBRIDEMENT BUTTOCKS N/A 12/02/2018   Procedure: IRRIGATION AND DEBRIDEMENT SACRAL DECUBITUS;  Surgeon: Karie SodaGross, Steven, MD;  Location: [facility];  Service: General;  Laterality: N/A;  . TONSILLECTOMY  age 73  . TOTAL ABDOMINAL HYSTERECTOMY W/ BILATERAL SALPINGOOPHORECTOMY  1984   and Appendectomy     reports that she has never smoked. She has never used smokeless tobacco. She reports that she does not drink alcohol or use drugs.  Allergies  Allergen Reactions  . Sinemet [Carbidopa W-Levodopa] Nausea And Vomiting    Able to tolerate with Zofran  . Sulfa Antibiotics Other (See Comments)    Flushing and hot  Family History  Problem Relation Age of Onset  . Breast cancer Maternal Aunt        x 4  . Pancreatic cancer Maternal Uncle 56  . Diabetes Brother   . Colon cancer Neg Hx     Prior to Admission medications   Medication Sig Start Date End Date Taking? Authorizing Provider  Amino Acids-Protein Hydrolys (FEEDING SUPPLEMENT, PRO-STAT SUGAR FREE 64,) LIQD Take 30 mLs by mouth 2 (two)  times daily.    Yes [provider]  apixaban (ELIQUIS) 2.5 MG TABS tablet Take 2.5 mg by mouth every 12 (twelve) hours.   Yes [provider]  carbidopa-levodopa (SINEMET IR) 25-100 MG tablet Take 1 tablet by mouth 3 (three) times daily. We will increase this slowly... For 1 week (take 1 tablet in the morning, 1/2 tablet at lunch, 1/2 tablet at dinner) For 1 week (take 1 tablet in the morning, 1 tablet at lunch, 1/2 tablet at dinner) For 1 week (take 1 tablet in morning, 1 tablet at lunch, 1 tablet at dinner) 12/25/18  Yes Suzzanne Cloud, NP  clonazePAM (KLONOPIN) 1 MG tablet Take 1 mg by mouth 3 (three) times daily. 10/12/19  Yes [provider]  divalproex (DEPAKOTE SPRINKLE) 125 MG capsule Take 250 mg by mouth 3 (three) times daily. 10/15/19  Yes [provider]  DULoxetine (CYMBALTA) 30 MG capsule Take 30 mg by mouth daily. 10/06/19  Yes [provider]  ferrous sulfate 325 (65 FE) MG tablet Take 325 mg by mouth every 12 (twelve) hours.    Yes [provider]  lansoprazole (PREVACID) 30 MG capsule Take 30 mg by mouth every other day.    Yes [provider]  midodrine (PROAMATINE) 2.5 MG tablet Take 1 tablet (2.5 mg total) by mouth 3 (three) times daily with meals. 12/11/18  Yes Hosie Poisson, MD  ondansetron (ZOFRAN) 4 MG tablet Take 1 tablet (4 mg total) by mouth every 8 (eight) hours as needed for nausea or vomiting. 01/16/18  Yes Star Age, MD  risperiDONE (RISPERDAL) 0.5 MG tablet Take 0.25 mg by mouth 2 (two) times daily.   Yes [provider]  silver nitrate applicators 66-44 % applicator Apply 10 Sticks (10 application total) topically once as needed for bleeding (bleeding from sacral wound). 12/11/18  Yes Hosie Poisson, MD  traMADol-acetaminophen (ULTRACET) 37.5-325 MG tablet Take 1 tablet by mouth every 8 (eight) hours as needed for moderate pain.  10/04/19  Yes [provider]  ALPRAZolam Duanne Moron) 0.5 MG  tablet Take 1 tablet (0.5 mg total) by mouth 3 (three) times daily as needed for anxiety. Patient not taking: Reported on 10/28/2019 12/11/18   Hosie Poisson, MD  apixaban (ELIQUIS) 5 MG TABS tablet Take 1 tablet (5 mg total) by mouth 2 (two) times daily. Patient not taking: Reported on 10/28/2019 12/11/18   Hosie Poisson, MD  cefTRIAXone (ROCEPHIN) 1 g injection Inject 1 g into the muscle daily. 10/17/19   [provider]  oxyCODONE-acetaminophen (PERCOCET/ROXICET) 5-325 MG tablet Take 1-2 tablets by mouth every 6 (six) hours as needed for severe pain. Patient not taking: Reported on 10/28/2019 12/11/18   Hosie Poisson, MD    Physical Exam: Vitals:   10/28/19 1430 10/28/19 1500 10/28/19 1530 10/28/19 1600  BP: 128/68 127/75 118/70 117/67  Pulse: (!) 108 (!) 114 (!) 111 (!) 122  Resp:  (!) 21 (!) 21 (!) 22  Temp:      TempSrc:      SpO2: 97%  99% 97% 100%  Weight:      Height:        Constitutional: NAD, yelling Vitals:   10/28/19 1430 10/28/19 1500 10/28/19 1530 10/28/19 1600  BP: 128/68 127/75 118/70 117/67  Pulse: (!) 108 (!) 114 (!) 111 (!) 122  Resp:  (!) 21 (!) 21 (!) 22  Temp:      TempSrc:      SpO2: 97% 99% 97% 100%  Weight:      Height:       Eyes: PERRL, lids and conjunctivae normal ENMT: Mucous membranes are moist. Neck: normal, supple, no masses, no thyromegaly Respiratory: increased resp effort, no audible wheezing Cardiovascular: perfused, tachycardic Abdomen: nondistended, nontender Musculoskeletal: L arm contracted, no clubbing Skin: no rashes, lesions, ulcers. No induration Neurologic: CN 2-12 grossly intact. Sensation intact. Strength 5/5 in all 4.  Psychiatric: Unable to assess given current mentation   Labs on Admission: I have personally reviewed following labs and imaging studies  CBC: Recent Labs  Lab 10/28/19 1152  WBC 9.7  NEUTROABS 7.8*  HGB 11.2*  HCT 35.1*  MCV 102.6*  PLT 203   Basic Metabolic Panel: Recent Labs  Lab  10/28/19 1152  NA 141  K 4.6  CL 100  CO2 31  GLUCOSE 143*  BUN 30*  CREATININE 0.77  CALCIUM 8.9   GFR: Estimated Creatinine Clearance: 58.3 mL/min (by C-G formula based on SCr of 0.77 mg/dL). Liver Function Tests: Recent Labs  Lab 10/28/19 1152  AST 19  ALT 7  ALKPHOS 123  BILITOT 0.6  PROT 7.4  ALBUMIN 3.2*   No results for input(s): LIPASE, AMYLASE in the last 168 hours. No results for input(s): AMMONIA in the last 168 hours. Coagulation Profile: No results for input(s): INR, PROTIME in the last 168 hours. Cardiac Enzymes: No results for input(s): CKTOTAL, CKMB, CKMBINDEX, TROPONINI in the last 168 hours. BNP (last 3 results) No results for input(s): PROBNP in the last 8760 hours. HbA1C: No results for input(s): HGBA1C in the last 72 hours. CBG: No results for input(s): GLUCAP in the last 168 hours. Lipid Profile: No results for input(s): CHOL, HDL, LDLCALC, TRIG, CHOLHDL, LDLDIRECT in the last 72 hours. Thyroid Function Tests: No results for input(s): TSH, T4TOTAL, FREET4, T3FREE, THYROIDAB in the last 72 hours. Anemia Panel: No results for input(s): VITAMINB12, FOLATE, FERRITIN, TIBC, IRON, RETICCTPCT in the last 72 hours. Urine analysis:    Component Value Date/Time   COLORURINE YELLOW 10/28/2019 1152   APPEARANCEUR CLEAR 10/28/2019 1152   LABSPEC 1.018 10/28/2019 1152   PHURINE 5.0 10/28/2019 1152   GLUCOSEU NEGATIVE 10/28/2019 1152   HGBUR MODERATE (A) 10/28/2019 1152   BILIRUBINUR NEGATIVE 10/28/2019 1152   KETONESUR NEGATIVE 10/28/2019 1152   PROTEINUR 30 (A) 10/28/2019 1152   UROBILINOGEN 1.0 06/07/2014 2200   NITRITE NEGATIVE 10/28/2019 1152   LEUKOCYTESUR NEGATIVE 10/28/2019 1152   Sepsis Labs: !!!!!!!!!!!!!!!!!!!!!!!!!!!!!!!!!!!!!!!!!!!! (procalcitonin:4,lacticidven:4) )No results found for this or any previous visit (from the past 240 hour(s)).   Radiological Exams on Admission: CT Angio Chest PE W and/or Wo Contrast  Result  Date: 10/28/2019 CLINICAL DATA:  Positive D-dimer. EXAM: CT ANGIOGRAPHY CHEST WITH CONTRAST TECHNIQUE: Multidetector CT imaging of the chest was performed using the standard protocol during bolus administration of intravenous contrast. Multiplanar CT image reconstructions and MIPs were obtained to evaluate the vascular anatomy. CONTRAST:  OMNIPAQUE IOHEXOL 350 MG/ML SOLN COMPARISON:  None. FINDINGS: Cardiovascular: Evaluation for pulmonary emboli is significantly limited by respiratory motion artifact.Given this  limitation, no large centrally located pulmonary embolism was detected. Detection of smaller pulmonary emboli is significantly limited. The heart size is normal. There is no evidence for a thoracic aortic aneurysm or dissection. Minimal atherosclerotic changes are noted of the thoracic aorta. Mediastinum/Nodes: --No mediastinal or hilar lymphadenopathy. --No axillary lymphadenopathy. --No supraclavicular lymphadenopathy. --there is a 1.4 cm left inferior thyroid nodule. No followup recommended (ref: J Am Coll Radiol. 2015 Feb;12(2): 143-50). --The esophagus is unremarkable Lungs/Pleura: Extensive bilateral ground-glass airspace opacities. These may be superimposed on a background of pleuroparenchymal scarring and emphysematous changes. There is no pneumothorax. The trachea is unremarkable. There is no significant pleural effusion. Upper Abdomen: No acute abnormality. Musculoskeletal: No chest wall abnormality. No acute or significant osseous findings. 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 Review of the MIP images confirms the above findings. IMPRESSION: 1. Evaluation for pulmonary emboli is significantly limited by respiratory motion artifact. Given this limitation, no large centrally located pulmonary embolism was detected. 2. There are extensive bilateral pulmonary opacities concerning for an atypical infectious process such as viral pneumonia. Aortic Atherosclerosis  (ICD10-I70.0) and Emphysema (ICD10-J43.9). Electronically Signed   By: Katherine Mantle M.D.   On: 10/28/2019 15:16   DG Chest Portable 1 View  Result Date: 10/28/2019 CLINICAL DATA:  Cough EXAM: PORTABLE CHEST 1 VIEW COMPARISON:  12/11/2018 FINDINGS: Asymmetric interstitial prominence within the lungs, left greater than right. Previously seen areas of consolidation in the right lung resolved. Heart is normal size. No effusions. No acute bony abnormality. IMPRESSION: Chronic interstitial prominence, left greater than right. This may reflect chronic interstitial lung disease. Electronically Signed   By: Charlett Nose M.D.   On: 10/28/2019 12:02    EKG: Independently reviewed. 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 Multifocal PNA with sepsis present on admit 1. Chest CT personally reviewed. Findings worrisome for viral PNA 2. Initial COVID testing is neg, however, pt resides at facility with recent COVID outbreak 3. Pt currently tachycardic and with tachypnea. Normal WBC 4. Pt reportedly with O2 sats in the low 80's in the field, currently maintaining O2 sats in the upper 90's on RA. 5. Repeat COVID is in progress. If pos, would start decadron and remdesivir 6. Inflammatory markers and procalcitonin ordered by EDP, currently pending 7. Family at bedside. Reports recent hx of aspiration over the past several weeks leading up to admission 8. Cont current abx with addition of flagyl 9. Will request SLP eval and cautiously continue on dysphagia diet for now with thickened liquids. Low threshold for NPO 10. Repeat CMP and CBC in AM 2. GERD 1. Will continue on PPI, IV formulation given concerns of possible aspiration 3. Dementia with Parkinson's 1. Family reports mentation seems stable at this time 2. Will hold parkinson's meds for  now until more stable and cleared for PO by SLP 4. Hx DVT on chronic anticoagulation 1. On eliquis prior to admit 2. Given concerns of aspiration, will hold meds and transition to heparin gtt 5. Anxiety 1. Yelling in room when seen 2. Family reports improvement with xanax PRN 3. Will give trial of PRN ativan 6. Hx aspiration 1. Family in room reports issues with aspiration over past several weeks 2. SLP consulted per above 7. End of Life 1. Discussed with family at bedside 2. Family is  well aware of pt's wishes for DNR/DNI 3. DNR orders placed  DVT prophylaxis: Heparin gtt  Code Status: DNR Family Communication: Pt in room, family at bedside  Disposition Plan: Uncertain at this time  Consults called:  Admission status: Inpatient as would likely require greater than 2 midnight stay to treat sepsis and PNA   Rickey Barbara MD [facility] Pager On Amion  If 7PM-7AM, please contact night-coverage  10/28/2019, 5:17 PM

## 2019-10-28 NOTE — ED Provider Notes (Signed)
[facility]-EMERGENCY DEPT Provider Note   CSN: [id number] Arrival date & time: 10/28/19  1113     History Chief Complaint  Patient presents with  . low oxygen    Becky Figueroa is a 73 y.o. female.  The history is provided by medical records and the EMS personnel. No language interpreter was used.  Shortness of Breath Severity:  Severe Onset quality:  Gradual Duration:  1 day Timing:  Constant Progression:  Waxing and waning Chronicity:  New Relieved by:  Nothing Worsened by:  Nothing Associated symptoms: cough and sputum production     LVL 5 caveat for dementia and respiratory distress     Past Medical History:  Diagnosis Date  . Anal stenosis   . Anxiety   . Decubitus ulcer   . 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 Depression   . Fatty liver   . GERD (gastroesophageal reflux disease)   . Hiatal hernia   . History of anal fissures   . History of chronic constipation   . History of kidney stones    1973  . Hyperlipidemia   . Left ureteral calculus   . Parkinson's disease (HCC)   . Wears glasses     Patient Active Problem List   Diagnosis Date Noted  . Coagulase negative Staphylococcus bacteremia   . Malnutrition of moderate degree 12/04/2018  . Constipation   . Osteomyelitis of sacroiliac region (HCC)   . AKI (acute kidney injury) (HCC)   . Sepsis (HCC) 11/26/2018  . Osteomyelitis of sacrum (HCC) 11/26/2018  . Decubitus ulcer with gangrene, stage 4 (HCC)   . Toxic encephalopathy 11/04/2018  . Palliative care by specialist   . Goals of care, counseling/discussion   . Pressure injury of skin 11/03/2018  . Acute metabolic encephalopathy 11/03/2018  . Dehydration 11/02/2018  . Protein calorie malnutrition (HCC) 11/02/2018  . 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 Parkinson's disease (HCC) 04/21/2018  . Nausea 01/20/2018  . Diarrhea 11/26/2013  . Fecal incontinence 11/26/2013  . Anal stenosis  10/14/2013  . Anal fissure 03/09/2013  . Unspecified constipation 03/09/2013  . ANXIETY 08/01/2010  . GERD 08/01/2010  . DYSPEPSIA 08/01/2010  . IRRITABLE BOWEL SYNDROME 08/01/2010    Past Surgical History:  Procedure Laterality Date  . ANAL FISSURECTOMY  1994   and Hemorrhoidectomy  . BOTOX INJECTION N/A 11/18/2013   Procedure: BOTOX INJECTION;  Surgeon: Hart Carwin, MD;  Location: [facility];  Service: Endoscopy;  Laterality: N/A;  . CHOLECYSTECTOMY OPEN  1982  . CYSTOSCOPY WITH RETROGRADE PYELOGRAM, URETEROSCOPY AND STENT PLACEMENT Left 06/06/2016   Procedure: CYSTOSCOPY WITH RETROGRADE PYELOGRAM, URETEROSCOPY AND STENT PLACEMENT;  Surgeon: Barron Alvine, MD;  Location: [facility];  Service: Urology;  Laterality: Left;  . FLEXIBLE SIGMOIDOSCOPY N/A 11/18/2013   Procedure: FLEXIBLE SIGMOIDOSCOPY/ with BOTOX;  Surgeon: Hart Carwin, MD;  Location: [facility];  Service: Endoscopy;  Laterality: N/A;  . HOLMIUM LASER APPLICATION Left 06/06/2016   Procedure: HOLMIUM LASER APPLICATION;  Surgeon: Barron Alvine, MD;  Location: [facility];  Service: Urology;  Laterality: Left;  . IRRIGATION AND DEBRIDEMENT BUTTOCKS N/A 12/02/2018   Procedure: IRRIGATION AND DEBRIDEMENT SACRAL DECUBITUS;  Surgeon: Karie Soda, MD;  Location: [facility];  Service: General;  Laterality: N/A;  . TONSILLECTOMY  age 42  . TOTAL ABDOMINAL HYSTERECTOMY W/ BILATERAL SALPINGOOPHORECTOMY  1984   and Appendectomy     OB History   No obstetric history on file.  Family History  Problem Relation Age of Onset  . Breast cancer Maternal Aunt        x 4  . Pancreatic cancer Maternal Uncle 56  . Diabetes Brother   . Colon cancer Neg Hx     Social History   Tobacco Use  . Smoking status: Never Smoker  . Smokeless tobacco: Never Used  Substance Use Topics  . Alcohol use: No  . Drug use: No    Home Medications Prior to Admission medications   Medication Sig Start Date End Date  Taking? Authorizing Provider  acetaminophen (TYLENOL) 500 MG tablet Take 1,000 mg by mouth 3 (three) times daily.    [provider]  ALPRAZolam Prudy Feeler(XANAX) 0.5 MG tablet Take 1 tablet (0.5 mg total) by mouth 3 (three) times daily as needed for anxiety. 12/11/18   Kathlen ModyAkula, Vijaya, MD  Amino Acids-Protein Hydrolys (FEEDING SUPPLEMENT, PRO-STAT SUGAR FREE 64,) LIQD Take 30 mLs by mouth at bedtime.    [provider]  apixaban (ELIQUIS) 5 MG TABS tablet Take 1 tablet (5 mg total) by mouth 2 (two) times daily. 12/11/18   Kathlen ModyAkula, Vijaya, MD  b complex vitamins tablet Take 1 tablet by mouth daily.    [provider]  carbidopa-levodopa (SINEMET IR) 25-100 MG tablet Take 1 tablet by mouth 3 (three) times daily. We will increase this slowly... For 1 week (take 1 tablet in the morning, 1/2 tablet at lunch, 1/2 tablet at dinner) For 1 week (take 1 tablet in the morning, 1 tablet at lunch, 1/2 tablet at dinner) For 1 week (take 1 tablet in morning, 1 tablet at lunch, 1 tablet at dinner) 12/25/18   Glean SalvoSlack, Sarah J, NP  ferrous sulfate 325 (65 FE) MG tablet Take 325 mg by mouth every other day.    [provider]  lansoprazole (PREVACID) 30 MG capsule Take 30 mg by mouth every morning.     [provider]  midodrine (PROAMATINE) 2.5 MG tablet Take 1 tablet (2.5 mg total) by mouth 3 (three) times daily with meals. 12/11/18   Kathlen ModyAkula, Vijaya, MD  Multiple Vitamins-Minerals (ONE-A-DAY WOMENS 50 PLUS PO) Take 1 tablet by mouth daily.    [provider]  ondansetron (ZOFRAN) 4 MG tablet Take 1 tablet (4 mg total) by mouth every 8 (eight) hours as needed for nausea or vomiting. Patient taking differently: Take 4 mg by mouth every 8 (eight) hours. Takes with carbidopa-levodopa 01/16/18   Huston FoleyAthar, Saima, MD  oxycodone (OXY-IR) 5 MG capsule Take 2.5 mg by mouth every 8 (eight) hours. Stop date 01/01/2019    [provider]  oxyCODONE-acetaminophen (PERCOCET/ROXICET) 5-325 MG  tablet Take 1-2 tablets by mouth every 6 (six) hours as needed for severe pain. 12/11/18   Kathlen ModyAkula, Vijaya, MD  silver nitrate applicators 75-25 % applicator Apply 10 Sticks (10 application total) topically once as needed for bleeding (bleeding from sacral wound). 12/11/18   Kathlen ModyAkula, Vijaya, MD  traMADol (ULTRAM) 50 MG tablet Take 50 mg by mouth as needed for moderate pain. Stop date 12/31/2018    [provider]    Allergies    Sinemet [carbidopa w-levodopa] and Sulfa antibiotics  Review of Systems   Review of Systems  Unable to perform ROS: Dementia  Respiratory: Positive for cough, sputum production and shortness of breath.     Physical Exam Updated Vital Signs There were no vitals taken for this visit.  Physical Exam Vitals and nursing note reviewed.  Constitutional:  General: She is not in acute distress.    Appearance: She is not ill-appearing, toxic-appearing or diaphoretic.  HENT:     Head: Normocephalic.     Nose: No congestion or rhinorrhea.     Mouth/Throat:     Mouth: Mucous membranes are moist.     Pharynx: No oropharyngeal exudate or posterior oropharyngeal erythema.  Eyes:     Conjunctiva/sclera: Conjunctivae normal.     Pupils: Pupils are equal, round, and reactive to light.  Cardiovascular:     Rate and Rhythm: Tachycardia present.     Pulses: Normal pulses.     Heart sounds: No murmur.  Pulmonary:     Effort: Pulmonary effort is normal. No respiratory distress.     Breath sounds: Rhonchi present. No wheezing or rales.  Chest:     Chest wall: No tenderness.  Abdominal:     General: Abdomen is flat.     Tenderness: There is no abdominal tenderness.  Musculoskeletal:        General: No tenderness.     Cervical back: No tenderness.  Skin:    Capillary Refill: Capillary refill takes less than 2 seconds.  Neurological:     Mental Status: She is alert. Mental status is at baseline. She is confused.     Motor: No abnormal muscle tone.     Comments:  AT baseline mental status per EMS report.  Patient is agitated but moving all extremities.  Psychiatric:        Behavior: Behavior is agitated.     ED Results / Procedures / Treatments   Labs (all labs ordered are listed, but only abnormal results are displayed) Labs Reviewed  CBC WITH DIFFERENTIAL/PLATELET - Abnormal; Notable for the following components:      Result Value   RBC 3.42 (*)    Hemoglobin 11.2 (*)    HCT 35.1 (*)    MCV 102.6 (*)    Neutro Abs 7.8 (*)    All other components within normal limits  COMPREHENSIVE METABOLIC PANEL - Abnormal; Notable for the following components:   Glucose, Bld 143 (*)    BUN 30 (*)    Albumin 3.2 (*)    All other components within normal limits  URINALYSIS, ROUTINE W REFLEX MICROSCOPIC - Abnormal; Notable for the following components:   Hgb urine dipstick MODERATE (*)    Protein, ur 30 (*)    RBC / HPF >50 (*)    Bacteria, UA RARE (*)    All other components within normal limits  D-DIMER, QUANTITATIVE (NOT AT [facility]) - Abnormal; Notable for the following components:   D-Dimer, Quant 0.94 (*)    All other components within normal limits  CULTURE, BLOOD (ROUTINE X 2)  CULTURE, BLOOD (ROUTINE X 2)  URINE CULTURE  SARS CORONAVIRUS 2 (TAT 6-24 HRS)  LACTIC ACID, PLASMA  LACTIC ACID, PLASMA  POC SARS CORONAVIRUS 2 AG -  ED  TROPONIN I (HIGH SENSITIVITY)  TROPONIN I (HIGH SENSITIVITY)    EKG EKG Interpretation  Date/Time:  Wednesday October 28 2019 11:27:45 EST Ventricular Rate:  110 PR Interval:    QRS Duration: 75 QT Interval:  325 QTC Calculation: 440 R Axis:   -2 Text Interpretation: Sinus tachycardia Atrial premature complex ST elevation, consider inferior injury When compared to prior, faster rate. No STEMI Confirmed by Theda Belfast ([id number]) on 10/28/2019 11:33:01 AM   Radiology CT Angio Chest PE W and/or Wo Contrast  Result Date: 10/28/2019 CLINICAL DATA:  Positive D-dimer. EXAM: CT  ANGIOGRAPHY CHEST WITH CONTRAST  TECHNIQUE: Multidetector CT imaging of the chest was performed using the standard protocol during bolus administration of intravenous contrast. Multiplanar CT image reconstructions and MIPs were obtained to evaluate the vascular anatomy. CONTRAST:  OMNIPAQUE IOHEXOL 350 MG/ML SOLN COMPARISON:  None. FINDINGS: Cardiovascular: Evaluation for pulmonary emboli is significantly limited by respiratory motion artifact.Given this limitation, no large centrally located pulmonary embolism was detected. Detection of smaller pulmonary emboli is significantly limited. The heart size is normal. There is no evidence for a thoracic aortic aneurysm or dissection. Minimal atherosclerotic changes are noted of the thoracic aorta. Mediastinum/Nodes: --No mediastinal or hilar lymphadenopathy. --No axillary lymphadenopathy. --No supraclavicular lymphadenopathy. --there is a 1.4 cm left inferior thyroid nodule. No followup recommended (ref: J Am Coll Radiol. 2015 Feb;12(2): 143-50). --The esophagus is unremarkable Lungs/Pleura: Extensive bilateral ground-glass airspace opacities. These may be superimposed on a background of pleuroparenchymal scarring and emphysematous changes. There is no pneumothorax. The trachea is unremarkable. There is no significant pleural effusion. Upper Abdomen: No acute abnormality. Musculoskeletal: No chest wall abnormality. No acute or significant osseous findings. 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 Review of the MIP images confirms the above findings. IMPRESSION: 1. Evaluation for pulmonary emboli is significantly limited by respiratory motion artifact. Given this limitation, no large centrally located pulmonary embolism was detected. 2. There are extensive bilateral pulmonary opacities concerning for an atypical infectious process such as viral pneumonia. Aortic Atherosclerosis (ICD10-I70.0) and Emphysema (ICD10-J43.9). Electronically Signed   By: Katherine Mantle  M.D.   On: 10/28/2019 15:16   DG Chest Portable 1 View  Result Date: 10/28/2019 CLINICAL DATA:  Cough EXAM: PORTABLE CHEST 1 VIEW COMPARISON:  12/11/2018 FINDINGS: Asymmetric interstitial prominence within the lungs, left greater than right. Previously seen areas of consolidation in the right lung resolved. Heart is normal size. No effusions. No acute bony abnormality. IMPRESSION: Chronic interstitial prominence, left greater than right. This may reflect chronic interstitial lung disease. Electronically Signed   By: Charlett Nose M.D.   On: 10/28/2019 12:02    Procedures Procedures (including critical care time)  Medications Ordered in ED Medications  sodium chloride (PF) 0.9 % injection (has no administration in time range)  cefTRIAXone (ROCEPHIN) 1 g in sodium chloride 0.9 % 100 mL IVPB (has no administration in time range)  azithromycin (ZITHROMAX) 500 mg in sodium chloride 0.9 % 250 mL IVPB (has no administration in time range)  iohexol (OMNIPAQUE) 350 MG/ML injection 100 mL (100 mLs Intravenous Contrast Given 10/28/19 1454)    ED Course  I have reviewed the triage vital signs and the nursing notes.  Pertinent labs & imaging results that were available during my care of the patient were reviewed by me and considered in my medical decision making (see chart for details).    MDM Rules/Calculators/A&P                      Becky Figueroa is a 73 y.o. female with a past medical history significant for Parkinson's, dementia, anxiety, irritable bowel syndrome, GERD, and recently finished antibiotics for pneumonia per EMS who presents with shortness of breath, cough, and respiratory distress.  According to facility, patient was found to have low oxygen saturations in the 80s with respiratory distress.  According to EMS, there is Covid at her facility but she had a negative Covid test last week.  Patient recently finished antibiotics for pneumonia over the weekend.  Patient is yelling  but  is not able to answer questions appropriately.  According to EMS report, this is her mental status baseline.  Patient did have some gurgling sounds and coughing during her transportation.  She was initially on 2 L but upon arrival, she was taken off 2 L to see how she did and her sats were in the 90s.  We will continue her off of oxygen while we see how she does.  On exam, she did have some coarseness in her breath sounds bilaterally.  Chest and abdomen did not appear tender although patient is unintelligibly screaming.  She had nontender extremities and she was moving all of her extremities.  Given the report that there is Covid at her facility, she will be tested for Covid with a rapid test initially and then a PCR second if it is negative.  She will have repeat chest x-ray and labs to look for worsening infection or sepsis.  We will continue to watch her oxygen saturations do and how she is feeling.  If she becomes hypoxic again or is found to have worsening infection, she will likely need admission for further antibiotics and oxygen.  We will also watch for further possible aspiration given the gurgling reported by EMS and her large amount of phlegm that she was coughing up.  Due to her tachycardia and the reported hypoxia and her age, will get D-dimer to rule out PE.  Anticipate reassessment after work-up.     12:56 PM Initial coronavirus test is negative, will send 6-hour test.  Lactic acid not elevated.  D-dimer is elevated, will order CT PE study.  Still awaiting rest of results.  Care transferred to oncoming team while awaiting results of CT scan and reassessment.  Anticipate admission if she becomes hypoxic or does not have improvement in her tachycardia, tachypnea, and breathing.   Final Clinical Impression(s) / ED Diagnoses Final diagnoses:  Tachycardia  Hypoxia  Tachypnea    Clinical Impression: 1. Tachycardia   2. Hypoxia   3. Tachypnea     Disposition: Care transferred to  oncoming team while awaiting results of CT scan and reassessment.  Anticipate admission if she becomes hypoxic or does not have improvement in her tachycardia, tachypnea, and breathing.  This note was prepared with assistance of Systems analyst. Occasional wrong-word or sound-a-like substitutions may have occurred due to the inherent limitations of voice recognition software.      Dannel Rafter, Gwenyth Allegra, MD 10/28/19 [phone]

## 2019-10-29 ENCOUNTER — Inpatient Hospital Stay (HOSPITAL_COMMUNITY): Payer: Medicare Other

## 2019-10-29 ENCOUNTER — Other Ambulatory Visit: Payer: Self-pay

## 2019-10-29 LAB — RESPIRATORY PANEL BY PCR

## 2019-10-29 LAB — COMPREHENSIVE METABOLIC PANEL
ALT: 17 U/L (ref 0–44)
AST: 25 U/L (ref 15–41)
Albumin: 2.7 g/dL — ABNORMAL LOW (ref 3.5–5.0)
Alkaline Phosphatase: 137 U/L — ABNORMAL HIGH (ref 38–126)
Anion gap: 12 (ref 5–15)
BUN: 27 mg/dL — ABNORMAL HIGH (ref 8–23)
CO2: 29 mmol/L (ref 22–32)
Calcium: 8.9 mg/dL (ref 8.9–10.3)
Chloride: 103 mmol/L (ref 98–111)
Creatinine, Ser: 0.81 mg/dL (ref 0.44–1.00)
GFR calc Af Amer: >60 mL/min (ref >60)
GFR calc non Af Amer: >60 mL/min (ref >60)
Glucose, Bld: 136 mg/dL — ABNORMAL HIGH (ref 70–99)
Potassium: 4.3 mmol/L (ref 3.5–5.1)
Sodium: 144 mmol/L (ref 135–145)
Total Bilirubin: 0.4 mg/dL (ref 0.3–1.2)
Total Protein: 7.2 g/dL (ref 6.5–8.1)

## 2019-10-29 LAB — CBC
HCT: 31.6 % — ABNORMAL LOW (ref 36.0–46.0)
Hemoglobin: 10 g/dL — ABNORMAL LOW (ref 12.0–15.0)
MCH: 32.3 pg (ref 26.0–34.0)
MCHC: 31.6 g/dL (ref 30.0–36.0)
MCV: 101.9 fL — ABNORMAL HIGH (ref 80.0–100.0)
Platelets: 191 10*3/uL (ref 150–400)
RBC: 3.1 MIL/uL — ABNORMAL LOW (ref 3.87–5.11)
RDW: 13.5 % (ref 11.5–15.5)
WBC: 11.6 10*3/uL — ABNORMAL HIGH (ref 4.0–10.5)
nRBC: 0 % (ref 0.0–0.2)

## 2019-10-29 LAB — RESPIRATORY PANEL BY RT PCR (FLU A&B, COVID)
Influenza A by PCR: NEGATIVE
Influenza B by PCR: NEGATIVE
SARS Coronavirus 2 by RT PCR: NEGATIVE

## 2019-10-29 LAB — C-REACTIVE PROTEIN: CRP: 14.8 mg/dL — ABNORMAL HIGH (ref <1.0)

## 2019-10-29 LAB — PROCALCITONIN: Procalcitonin: 0.23 ng/mL

## 2019-10-29 MED ORDER — SODIUM CHLORIDE 0.9 % IV SOLN: INTRAVENOUS | Status: DC

## 2019-10-29 MED ORDER — SODIUM CHLORIDE 0.9 % IV SOLN
500.0000 mg | INTRAVENOUS | Status: DC
  Administered 2019-10-29 – 2019-10-31 (×3): 500 mg via INTRAVENOUS
  Filled 2019-10-29 (×3): qty 500

## 2019-10-29 MED ORDER — SODIUM CHLORIDE 0.9 % IV SOLN
1.0000 g | INTRAVENOUS | Status: DC
  Administered 2019-10-29: 1 g via INTRAVENOUS
  Filled 2019-10-29: qty 1

## 2019-10-29 MED ORDER — SODIUM CHLORIDE 0.9 % IV SOLN
200.0000 mg | Freq: Once | INTRAVENOUS | Status: DC
  Filled 2019-10-29: qty 40

## 2019-10-29 MED ORDER — DEXAMETHASONE SODIUM PHOSPHATE 10 MG/ML IJ SOLN
6.0000 mg | INTRAMUSCULAR | Status: DC
  Administered 2019-10-29 – 2019-11-03 (×6): 6 mg via INTRAVENOUS
  Filled 2019-10-29 (×6): qty 1

## 2019-10-29 MED ORDER — ZINC SULFATE 220 (50 ZN) MG PO CAPS
220.0000 mg | ORAL_CAPSULE | Freq: Every day | ORAL | Status: DC
  Administered 2019-11-01 – 2019-11-03 (×3): 220 mg via ORAL
  Filled 2019-10-29 (×4): qty 1

## 2019-10-29 MED ORDER — IPRATROPIUM-ALBUTEROL 0.5-2.5 (3) MG/3ML IN SOLN: 3.0000 mL | RESPIRATORY_TRACT | Status: DC | PRN

## 2019-10-29 MED ORDER — SODIUM CHLORIDE 0.9 % IV SOLN: 100.0000 mg | Freq: Every day | INTRAVENOUS | Status: DC

## 2019-10-29 MED ORDER — MORPHINE SULFATE (PF) 2 MG/ML IV SOLN
1.0000 mg | INTRAVENOUS | Status: DC | PRN
  Administered 2019-10-29 – 2019-11-03 (×11): 1 mg via INTRAVENOUS
  Filled 2019-10-29 (×11): qty 1

## 2019-10-29 MED ORDER — ASCORBIC ACID 500 MG PO TABS
1000.0000 mg | ORAL_TABLET | Freq: Every day | ORAL | Status: DC
  Administered 2019-11-01 – 2019-11-03 (×3): 1000 mg via ORAL
  Filled 2019-10-29 (×4): qty 2

## 2019-10-29 NOTE — Progress Notes (Signed)
PROGRESS NOTE    VIOLETTA LAVALLE  ZOX:[id number] DOB: 21-Jul-1946 DOA: 10/28/2019 PCP: Richmond Campbell., PA-C    Brief Narrative:  73 y.o. 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 Pt has underlying dementia and cannot provide her own history, only yelling constantly when seen. Family at bedside who states pt recently completed course of abx for PNA and has hx of dysphagia and concerns of aspiration several weeks leading up to ED visit. Pt presents from facility which reportedly has case of COVID outbreak. Per EDP, pt noted to be markedly sob and EMS called. On arrival, pt noted to have O2 sats in the low-80's, improved with 2LNC.   ED Course: In the ED, had CXR with findings initially suggestive of chronic interstitial changes. Follow up CT chest with findings worrisome for viral PNA. Initial COVID testing was neg. Repeat covid test ordered and is pending. Pt was started on empiric azithro and rocephin. Inflammatory markers were ordered and remain pending. Pt has since been weaned to room air. Hospitalist consulted for consideration for admission.  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 Multifocal PNA with sepsis present on admit 1. Chest CT personally reviewed. Findings worrisome for viral PNA 2. Initial COVID testing is neg, however, pt resides at facility with recent COVID outbreak 3. Pt currently tachycardic and with tachypnea. Normal WBC 4. Pt reportedly with O2 sats in the low 80's in the field, initially improved to mid-90's on room air 5. Repeat COVID neg 6. Inflammatory markers and procalcitonin ordered by EDP at  presentation, found to have neg procalcitonin with mildly elevated CRP of 3.6 7. Family at bedside. Reports recent hx of aspiration over the past several weeks leading up to admission 8. Cont current abx with addition of flagyl 9. Today, pt has increased O2 requirements up to [facility]. Have ordered and reviewed CXR. Findings concerning for multiocal PNA 10. Repeat COVID noted to be neg. Have discussed with ID and [facility] provider. Findings and presentation is concerning for possible viral process, still cannot rule out COVID despite multiple negative tests. Recommendations by [facility] provider to continue pt on decadron  daily x 10 days 11. Repeat CRP up to 14.8. Will recheck procalcitonin 2. GERD 1. Will continue on PPI, IV formulation given concerns of possible aspiration 3. Dementia with Parkinson's 1. Family reports mentation seems stable at this time 2. Will hold parkinson's meds for now until more stable and cleared for PO by SLP 3. Seems stable at this time 4. Hx DVT on chronic anticoagulation 1. On eliquis prior to admit 2. Given concerns of aspiration, holding xarelto and continue therapeutic lovenox 5. Anxiety 1. Was yelling at time of presentation 2. Improved with PRN ativan 6. Hx aspiration 1. Family in room reports issues with aspiration over past several weeks 2. SLP consulted per above, currently not awake enough to pariticipate in eval 7. End of Life 1. Discussed with family at bedside at time of admit 2. Family is well aware of pt's wishes for DNR/DNI  DVT prophylaxis: Levonox subq Code Status: DNR Family Communication: Pt in room, family at bedside Disposition Plan: Uncertain at this time  Consultants:  Discussed case with ID over phone  Procedures:     Antimicrobials: Anti-infectives (From admission, onward)   Start     Dose/Rate Route Frequency Ordered Stop   10/30/19 1000  remdesivir 100 mg in sodium chloride 0.9 % 100 mL IVPB  Status:  Discontinued     100  mg 200 mL/hr over 30 Minutes Intravenous Daily 10/29/19 1204 10/29/19 1513   10/29/19 1600  cefTRIAXone (ROCEPHIN) 1 g in sodium chloride 0.9 % 100 mL IVPB     1 g 200 mL/hr over 30 Minutes Intravenous Every 24 hours 10/29/19 0944     10/29/19 1400  remdesivir 200 mg in sodium chloride 0.9% 250 mL IVPB  Status:  Discontinued     200 mg 580 mL/hr over 30 Minutes Intravenous Once 10/29/19 1204 10/29/19 1513   10/28/19 2000  metroNIDAZOLE (FLAGYL) IVPB 500 mg  Status:  Discontinued     500 mg 100 mL/hr over 60 Minutes Intravenous Every 8 hours 10/28/19 1714 10/28/19 1907   10/28/19 2000  metroNIDAZOLE (FLAGYL) IVPB 500 mg     500 mg 100 mL/hr over 60 Minutes Intravenous Every 8 hours 10/28/19 1907     10/28/19 1600  cefTRIAXone (ROCEPHIN) 1 g in sodium chloride 0.9 % 100 mL IVPB     1 g 200 mL/hr over 30 Minutes Intravenous  Once 10/28/19 1553 10/28/19 1728   10/28/19 1600  azithromycin (ZITHROMAX) 500 mg in sodium chloride 0.9 % 250 mL IVPB     500 mg 250 mL/hr over 60 Minutes Intravenous  Once 10/28/19 1553 10/28/19 1728       Subjective: Unable to assess given current mentation  Objective: Vitals:   10/29/19 0141 10/29/19 0508 10/29/19 1111 10/29/19 1521  BP: 103/61 104/69 119/68 111/64  Pulse: (!) 107 98 93 (!) 101  Resp: 20 20 (!) 24 17  Temp: 98.7 F (37.1 C) 98.9 F (37.2 C) 97.7 F (36.5 C) 98 F (36.7 C)  TempSrc: Oral Oral Axillary Axillary  SpO2: 93% 98% 92% 95%  Weight:      Height:        Intake/Output Summary (Last 24 hours) at 10/29/2019 1659 Last data filed at 10/28/2019 1728 Gross per 24 hour  Intake 350 ml  Output --  Net 350 ml   Filed Weights   10/28/19 1129  Weight: 59 kg    Examination:  General exam: Appears calm and comfortable  Respiratory system: coarse, normal resp effort. Cardiovascular system: S1 & S2 heard,regular Gastrointestinal system: Abdomen is nondistended, soft and nontender. No organomegaly or masses felt. Normal bowel  sounds heard. Central nervous system: Asleep. No focal neurological deficits. Extremities: Symmetric 5 x 5 power. Skin: No rashes, lesions or ulcers Psychiatry: Unable to assess given current mentation.   Data Reviewed: I have personally reviewed following labs and imaging studies  CBC: Recent Labs  Lab 10/28/19 1152 10/29/19 0541  WBC 9.7 11.6*  NEUTROABS 7.8*  --   HGB 11.2* 10.0*  HCT 35.1* 31.6*  MCV 102.6* 101.9*  PLT 203 191   Basic Metabolic Panel: Recent Labs  Lab 10/28/19 1152 10/29/19 0541  NA 141 144  K 4.6 4.3  CL 100 103  CO2 31 29  GLUCOSE 143* 136*  BUN 30* 27*  CREATININE 0.77 0.81  CALCIUM 8.9 8.9   GFR: Estimated Creatinine Clearance: 57.6 mL/min (by C-G formula based on SCr of 0.81 mg/dL). Liver Function Tests: Recent Labs  Lab 10/28/19 1152 10/29/19 0541  AST 19 25  ALT 7 17  ALKPHOS 123 137*  BILITOT 0.6 0.4  PROT 7.4 7.2  ALBUMIN 3.2* 2.7*   No results for input(s): LIPASE, AMYLASE in the last 168 hours. No results for input(s): AMMONIA in the last 168 hours. Coagulation Profile: No results for input(s): INR, PROTIME in the last 168 hours. Cardiac Enzymes: No results for input(s): CKTOTAL, CKMB, CKMBINDEX, TROPONINI in the last 168 hours. BNP (last 3 results) No results for input(s): PROBNP in the last 8760 hours. HbA1C: No results for input(s): HGBA1C in the last 72 hours. CBG: No results for input(s): GLUCAP in the last 168 hours. Lipid Profile: Recent Labs    10/28/19 1732  TRIG 99   Thyroid Function Tests: No results for input(s): TSH, T4TOTAL, FREET4, T3FREE, THYROIDAB in the last 72 hours. Anemia Panel: Recent Labs    10/28/19 1732  FERRITIN 125   Sepsis Labs: Recent Labs  Lab 10/28/19 1152 10/28/19 1732  PROCALCITON  --  <0.10  LATICACIDVEN 1.8  --     Recent Results (from the past 240 hour(s))  Blood culture (routine x 2)     Status: None (Preliminary result)   Collection Time: 10/28/19 11:52 AM    Specimen: BLOOD LEFT HAND  Result Value Ref Range Status   Specimen Description   Final    BLOOD LEFT HAND Performed at [facility], [address] [place], [place] [address]    Special Requests   Final    BOTTLES DRAWN AEROBIC AND ANAEROBIC Blood Culture results may not be optimal due to an inadequate volume of blood received in culture bottles Performed at [facility], [address] [place], [place] [address]    Culture   Final    NO GROWTH < 24 HOURS Performed at [facility], [address] [place], [place] [address]    Report Status PENDING  Incomplete  Blood culture (routine x 2)     Status: None (Preliminary result)   Collection Time: 10/28/19 11:52 AM   Specimen: BLOOD RIGHT FOREARM  Result Value Ref Range Status   Specimen Description   Final    BLOOD RIGHT FOREARM Performed at [facility], [address] [place], [place] [address]    Special Requests   Final    BOTTLES DRAWN AEROBIC AND ANAEROBIC Blood Culture adequate volume Performed at [facility], [address] [place], [place] [address]    Culture   Final    NO GROWTH < 24 HOURS Performed at [facility], [address] [place], [place] [address]    Report Status PENDING  Incomplete  Urine culture     Status: Abnormal (Preliminary result)   Collection Time: 10/28/19 11:52 AM   Specimen: Urine, Random  Result Value Ref Range Status   Specimen Description   Final    URINE, RANDOM Performed at [facility], [address] [place], [place] [address]    Special Requests   Final    NONE Performed at [facility], [address] [place], [place] [address]    Culture 50,000 COLONIES/mL ENTEROCOCCUS FAECALIS (A)  Final   Report Status PENDING  Incomplete  SARS CORONAVIRUS 2 (TAT 6-24 HRS) Nasopharyngeal Nasopharyngeal Swab     Status: None   Collection Time: 10/28/19  1:19 PM    Specimen: Nasopharyngeal Swab  Result Value Ref Range Status   SARS Coronavirus 2 NEGATIVE NEGATIVE Final    Comment: (NOTE)  SARS-CoV-2 target nucleic acids are NOT DETECTED. The SARS-CoV-2 RNA is generally detectable in upper and lower respiratory specimens during the acute phase of infection. Negative results do not preclude SARS-CoV-2 infection, do not rule out co-infections with other pathogens, and should not be used as the sole basis for treatment or other patient management decisions. Negative results must be combined with clinical observations, patient history, and epidemiological information. The expected result is Negative. Fact Sheet for Patients: HairSlick.no Fact Sheet for Healthcare Providers: quierodirigir.com This test is not yet approved or cleared by the [place] FDA and  has been authorized for detection and/or diagnosis of SARS-CoV-2 by FDA under an Emergency Use Authorization (EUA). This EUA will remain  in effect (meaning this test can be used) for the duration of the COVID-19 declaration under Section 56 4(b)(1) of the Act, 21 U.S.C. section 360bbb-3(b)(1), unless the authorization is terminated or revoked sooner. Performed at [facility], [address], [place], [place] [address]   Respiratory Panel by RT PCR (Flu A&B, Covid) - Nasopharyngeal Swab     Status: None   Collection Time: 10/29/19 12:00 PM   Specimen: Nasopharyngeal Swab  Result Value Ref Range Status   SARS Coronavirus 2 by RT PCR NEGATIVE NEGATIVE Final    Comment: (NOTE) SARS-CoV-2 target nucleic acids are NOT DETECTED. The SARS-CoV-2 RNA is generally detectable in upper respiratoy specimens during the acute phase of infection. The lowest concentration of SARS-CoV-2 viral copies this assay can detect is 131 copies/mL. A negative result does not preclude SARS-Cov-2 infection and should not be used as the sole basis for  treatment or other patient management decisions. A negative result may occur with  improper specimen collection/handling, submission of specimen other than nasopharyngeal swab, presence of viral mutation(s) within the areas targeted by this assay, and inadequate number of viral copies (<131 copies/mL). A negative result must be combined with clinical observations, patient history, and epidemiological information. The expected result is Negative. Fact Sheet for Patients:  https://www.moore.com/ Fact Sheet for Healthcare Providers:  https://www.young.biz/ This test is not yet ap proved or cleared by the [place] FDA and  has been authorized for detection and/or diagnosis of SARS-CoV-2 by FDA under an Emergency Use Authorization (EUA). This EUA will remain  in effect (meaning this test can be used) for the duration of the COVID-19 declaration under Section 564(b)(1) of the Act, 21 U.S.C. section 360bbb-3(b)(1), unless the authorization is terminated or revoked sooner.    Influenza A by PCR NEGATIVE NEGATIVE Final   Influenza B by PCR NEGATIVE NEGATIVE Final    Comment: (NOTE) The Xpert Xpress SARS-CoV-2/FLU/RSV assay is intended as an aid in  the diagnosis of influenza from Nasopharyngeal swab specimens and  should not be used as a sole basis for treatment. Nasal washings and  aspirates are unacceptable for Xpert Xpress SARS-CoV-2/FLU/RSV  testing. Fact Sheet for Patients: https://www.moore.com/ Fact Sheet for Healthcare Providers: https://www.young.biz/ This test is not yet approved or cleared by the [place] FDA and  has been authorized for detection and/or diagnosis of SARS-CoV-2 by  FDA under an Emergency Use Authorization (EUA). This EUA will remain  in effect (meaning this test can be used) for the duration of the  Covid-19 declaration under Section 564(b)(1) of the Act, 21  U.S.C. section  360bbb-3(b)(1), unless the authorization is  terminated or revoked. Performed at [facility], [address], [place], [place] [address]      Radiology Studies: CT Angio Chest  PE W and/or Wo Contrast  Result Date: 10/28/2019 CLINICAL DATA:  Positive D-dimer. EXAM: CT ANGIOGRAPHY CHEST WITH CONTRAST TECHNIQUE: Multidetector CT imaging of the chest was performed using the standard protocol during bolus administration of intravenous contrast. Multiplanar CT image reconstructions and MIPs were obtained to evaluate the vascular anatomy. CONTRAST:  OMNIPAQUE IOHEXOL 350 MG/ML SOLN COMPARISON:  None. FINDINGS: Cardiovascular: Evaluation for pulmonary emboli is significantly limited by respiratory motion artifact.Given this limitation, no large centrally located pulmonary embolism was detected. Detection of smaller pulmonary emboli is significantly limited. The heart size is normal. There is no evidence for a thoracic aortic aneurysm or dissection. Minimal atherosclerotic changes are noted of the thoracic aorta. Mediastinum/Nodes: --No mediastinal or hilar lymphadenopathy. --No axillary lymphadenopathy. --No supraclavicular lymphadenopathy. --there is a 1.4 cm left inferior thyroid nodule. No followup recommended (ref: J Am Coll Radiol. 2015 Feb;12(2): 143-50). --The esophagus is unremarkable Lungs/Pleura: Extensive bilateral ground-glass airspace opacities. These may be superimposed on a background of pleuroparenchymal scarring and emphysematous changes. There is no pneumothorax. The trachea is unremarkable. There is no significant pleural effusion. Upper Abdomen: No acute abnormality. Musculoskeletal: No chest wall abnormality. No acute or significant osseous findings. 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 Review of the MIP images confirms the above findings. IMPRESSION: 1. Evaluation for pulmonary emboli is significantly limited by respiratory motion  artifact. Given this limitation, no large centrally located pulmonary embolism was detected. 2. There are extensive bilateral pulmonary opacities concerning for an atypical infectious process such as viral pneumonia. Aortic Atherosclerosis (ICD10-I70.0) and Emphysema (ICD10-J43.9). Electronically Signed   By: Katherine Mantle M.D.   On: 10/28/2019 15:16   DG CHEST PORT 1 VIEW  Result Date: 10/29/2019 CLINICAL DATA:  In the ED, had CXR with findings initially suggestive of chronic interstitial changes. Follow up CT chest with findings worrisome for viral PNA. Initial COVID testing was neg. Repeat covid test ordered and is pending. EXAM: PORTABLE CHEST 1 VIEW COMPARISON:  Chest radiograph 10/28/2019, chest CT 10/28/2019 FINDINGS: Stable cardiomediastinal contours. There are patchy opacities throughout the left mid to lower lung as well as more subtly in the right upper lobe which are suspicious for multifocal infection. No pneumothorax or large pleural effusion. No acute finding in the visualized skeleton. IMPRESSION: Patchy opacities predominantly throughout the left mid to lower lung and to a lesser extent in the right upper lobe suspicious for multifocal infection. Electronically Signed   By: Emmaline Kluver M.D.   On: 10/29/2019 14:01   DG Chest Portable 1 View  Result Date: 10/28/2019 CLINICAL DATA:  Cough EXAM: PORTABLE CHEST 1 VIEW COMPARISON:  12/11/2018 FINDINGS: Asymmetric interstitial prominence within the lungs, left greater than right. Previously seen areas of consolidation in the right lung resolved. Heart is normal size. No effusions. No acute bony abnormality. IMPRESSION: Chronic interstitial prominence, left greater than right. This may reflect chronic interstitial lung disease. Electronically Signed   By: Charlett Nose M.D.   On: 10/28/2019 12:02    Scheduled Meds: . vitamin C  1,000 mg Oral Daily  . dexamethasone (DECADRON) injection  6 mg Intravenous Q24H  . enoxaparin (LOVENOX)  injection  1 mg/kg Subcutaneous BID  . pantoprazole (PROTONIX) IV  40 mg Intravenous Q24H  . zinc sulfate  220 mg Oral Daily   Continuous Infusions: . sodium chloride 75 mL/hr at 10/29/19 1217  . cefTRIAXone (ROCEPHIN)  IV 1 g (10/29/19 1518)  . metronidazole 500 mg (10/29/19 1400)  LOS: 1 day   Marylu Lund, MD [facility] Pager On Amion  If 7PM-7AM, please contact night-coverage 10/29/2019, 4:59 PM

## 2019-10-29 NOTE — Progress Notes (Signed)
SLP Cancellation Note  Patient Details Name: ROYCE SCIARA MRN: [id number] DOB: [birth year]   Cancelled treatment:        Patient is lethargic. Will see as schedule permits.    Wynelle Bourgeois., MA, CCC-SLP 10/29/2019, 9:35 AM

## 2019-10-29 NOTE — Progress Notes (Signed)
OT Cancellation Note  Patient Details Name: Becky Figueroa MRN: [id number] DOB: 1946/09/29   Cancelled Treatment:    Reason Eval/Treat Not Completed: OT screened, no needs identified, will sign off. Pt is a resident of [facility]; will defer OT intervention to that facility, if pt has had a change in status.  Teriyah Purington 10/29/2019, 7:55 AM  Karsten Ro, OTR/L Acute Rehabilitation Services 10/29/2019

## 2019-10-29 NOTE — Progress Notes (Signed)
Pt admitted to room 1407 with viral pneumonia, tachycardia. Pt with dementia and parkinsons, disoriented x4, pt moans and chants. Daughter at bedsides states this how she has been but has gotten worse. Stage 3 ulcer to coccyx that was a surgical site in 2/20.  Foam dressing applied, wound consult will be placed. Ecchymosis to arms, feet with foot drop,legs stiff, left hand conctracted, purewick placed, Currently,patient sleeping, sats 94% on 2/l SR at 66, daughter will spend the night with patient.

## 2019-10-29 NOTE — Plan of Care (Signed)
Request swallow eval, pt has history of aspiration

## 2019-10-29 NOTE — Progress Notes (Signed)
PT Cancellation Note  Patient Details Name: Becky Figueroa MRN: [id number] DOB: [birth year]   Cancelled Treatment:    Reason Eval/Treat Not Completed: Fatigue/lethargy limiting ability to participate. attempt again as schedule permits, however per PT notes from Feb 2020 pt was max/total assist for bed mobility, question if pt is total care at her baseline?   [facility] 10/29/2019, 9:43 AM

## 2019-10-30 LAB — CBC
HCT: 29 % — ABNORMAL LOW (ref 36.0–46.0)
Hemoglobin: 8.6 g/dL — ABNORMAL LOW (ref 12.0–15.0)
MCH: 31 pg (ref 26.0–34.0)
MCHC: 29.7 g/dL — ABNORMAL LOW (ref 30.0–36.0)
MCV: 104.7 fL — ABNORMAL HIGH (ref 80.0–100.0)
Platelets: 157 10*3/uL (ref 150–400)
RBC: 2.77 MIL/uL — ABNORMAL LOW (ref 3.87–5.11)
RDW: 13.4 % (ref 11.5–15.5)
WBC: 5.9 10*3/uL (ref 4.0–10.5)
nRBC: 0 % (ref 0.0–0.2)

## 2019-10-30 LAB — COMPREHENSIVE METABOLIC PANEL
ALT: 17 U/L (ref 0–44)
AST: 24 U/L (ref 15–41)
Albumin: 2.6 g/dL — ABNORMAL LOW (ref 3.5–5.0)
Alkaline Phosphatase: 124 U/L (ref 38–126)
Anion gap: 8 (ref 5–15)
BUN: 27 mg/dL — ABNORMAL HIGH (ref 8–23)
CO2: 30 mmol/L (ref 22–32)
Calcium: 8.6 mg/dL — ABNORMAL LOW (ref 8.9–10.3)
Chloride: 111 mmol/L (ref 98–111)
Creatinine, Ser: 0.76 mg/dL (ref 0.44–1.00)
GFR calc Af Amer: >60 mL/min (ref >60)
GFR calc non Af Amer: >60 mL/min (ref >60)
Glucose, Bld: 101 mg/dL — ABNORMAL HIGH (ref 70–99)
Potassium: 4.1 mmol/L (ref 3.5–5.1)
Sodium: 149 mmol/L — ABNORMAL HIGH (ref 135–145)
Total Bilirubin: 0.3 mg/dL (ref 0.3–1.2)
Total Protein: 6.6 g/dL (ref 6.5–8.1)

## 2019-10-30 LAB — FERRITIN: Ferritin: 151 ng/mL (ref 11–307)

## 2019-10-30 LAB — URINE CULTURE: Culture: 50000 — AB

## 2019-10-30 LAB — PROCALCITONIN: Procalcitonin: 0.2 ng/mL

## 2019-10-30 LAB — C-REACTIVE PROTEIN: CRP: 12.7 mg/dL — ABNORMAL HIGH (ref <1.0)

## 2019-10-30 MED ORDER — SODIUM CHLORIDE 0.9 % IV SOLN
1.5000 g | Freq: Four times a day (QID) | INTRAVENOUS | Status: DC
  Administered 2019-10-30 – 2019-11-03 (×18): 1.5 g via INTRAVENOUS
  Filled 2019-10-30 (×2): qty 4
  Filled 2019-10-30 (×13): qty 1.5
  Filled 2019-10-30: qty 4
  Filled 2019-10-30 (×4): qty 1.5

## 2019-10-30 MED ORDER — SODIUM CHLORIDE 0.45 % IV SOLN: INTRAVENOUS | Status: DC

## 2019-10-30 MED ORDER — SODIUM CHLORIDE 0.9 % IV BOLUS
500.0000 mL | Freq: Once | INTRAVENOUS | Status: AC
  Administered 2019-10-30: 500 mL via INTRAVENOUS

## 2019-10-30 NOTE — Progress Notes (Signed)
Turned and repositioned pt and elevated feet on pillows. Pt with extensive bilateral foot drop. SRP, RN

## 2019-10-30 NOTE — Plan of Care (Signed)
  Problem: Nutrition Goal: Nutritional status is improving Description: Monitor and assess patient for malnutrition (ex- brittle hair, bruises, dry skin, pale skin and conjunctiva, muscle wasting, smooth red tongue, and disorientation). Collaborate with interdisciplinary team and initiate plan and interventions as ordered.  Monitor patient's weight and dietary intake as ordered or per policy. Utilize nutrition screening tool and intervene per policy. Determine patient's food preferences and provide high-protein, high-caloric foods as appropriate.  Outcome: Progressing   Problem: Education: Goal: Knowledge of General Education information will improve Description: Including pain rating scale, medication(s)/side effects and non-pharmacologic comfort measures Outcome: Progressing   Problem: Health Behavior/Discharge Planning: Goal: Ability to manage health-related needs will improve Outcome: Progressing   Problem: Clinical Measurements: Goal: Ability to maintain clinical measurements within normal limits will improve Outcome: Progressing

## 2019-10-30 NOTE — Plan of Care (Signed)
  Problem: Nutrition Goal: Nutritional status is improving Description: Monitor and assess patient for malnutrition (ex- brittle hair, bruises, dry skin, pale skin and conjunctiva, muscle wasting, smooth red tongue, and disorientation). Collaborate with interdisciplinary team and initiate plan and interventions as ordered.  Monitor patient's weight and dietary intake as ordered or per policy. Utilize nutrition screening tool and intervene per policy. Determine patient's food preferences and provide high-protein, high-caloric foods as appropriate.  Outcome: Progressing   Problem: Health Behavior/Discharge Planning: Goal: Ability to manage health-related needs will improve 10/30/2019 1459 by Charmian Muff, RN Outcome: Progressing 10/30/2019 1457 by Charmian Muff, RN Outcome: Progressing   Problem: Clinical Measurements: Goal: Ability to maintain clinical measurements within normal limits will improve 10/30/2019 1459 by Charmian Muff, RN Outcome: Progressing 10/30/2019 1457 by Charmian Muff, RN Outcome: Progressing

## 2019-10-30 NOTE — Progress Notes (Signed)
Pt had 150cc urine output this shift. Bladder scan shows 126cc. Paged NP Bodenheimer.

## 2019-10-30 NOTE — Progress Notes (Signed)
Spoke with son Lorin Picket, updated him on pt condition and plan of care. Joellyn Quails RN

## 2019-10-30 NOTE — Evaluation (Addendum)
Physical Therapy Evaluation Patient Details Name: Becky Figueroa MRN: [id number] DOB: [birth year] Today's Date: 10/30/2019   History of Present Illness  74 y.o. 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 Pt has underlying dementia and cannot provide her own history, only yelling constantly when seen. Family at bedside who states pt recently completed course of abx for PNA and has hx of dysphagia and concerns of aspiration several weeks leading up to ED visit. Pt presents from facility which reportedly has case of COVID outbreak. Per EDP, pt noted to be markedly sob and EMS called. On arrival, pt noted to have O2 sats in the low-80's, improved with 2LNC.    Clinical Impression  Becky Figueroa is 74 y.o. female admitted with above HPI and diagnosis. Patient currently resides at [facility] and requires Total assist for mobility. She was no agreeable to attempt supine<>sit transfers this date and required max+2/total assist for rolling. Patient has significant flexion contractures of UE's and at bil knees and plantar flexion contractures in bil ankles. Patient would benefit from Prevalon boots to prevent pressure injuries on feet and assist with proper alignment of LE's to prevent further ROM impairments and pressure injuries at hips and knees. All needs can be met by RN staff, Acute PT will sign off for therapy; recommend pt return to facility with 24/7 assist.    Follow Up Recommendations SNF;Supervision/Assistance - 24 hour(pt can return to facility with 24/7 assist)    Equipment Recommendations  None recommended by PT    Recommendations for Other Services       Precautions / Restrictions Precautions Precautions: Fall Restrictions Weight Bearing Restrictions: No       Mobility  Bed Mobility Overal bed mobility: Needs Assistance Bed Mobility: Rolling Rolling: +2 for safety/equipment;Total assist;+2 for physical assistance         General bed mobility comments: pt unable to follow cues or commands for rolling or reaching. pt requried 2+ max/Total assist for bed mob. Pt declined to perform supine to sit transfer and stated 2x "I'm finished". No further mobility performed.  Transfers           Ambulation/Gait       Stairs       Wheelchair Mobility    Modified Rankin (Stroke Patients Only)             Pertinent Vitals/Pain Pain Assessment: Faces Faces Pain Scale: Hurts little more Pain Descriptors / Indicators: Guarding;Grimacing    Home Living Family/patient expects to be discharged to:: Skilled nursing facility           Prior Function Level of Independence: Needs assistance   Gait / Transfers Assistance Needed: pt unable to provide PLOF, per chart review pt appears to have been Total Assist/Max Assist +2 for bed mobility and transfers in Feb 2020.           Hand Dominance        Extremity/Trunk Assessment   Upper Extremity Assessment Upper Extremity Assessment: Generalized weakness    Lower Extremity Assessment Lower Extremity Assessment: Generalized weakness    Cervical / Trunk Assessment Cervical / Trunk Assessment: Other exceptions Cervical / Trunk Exceptions: pt with flexed trunk and flexion contractures of bil UE's at elbow and wrist (Lt UE>Rt UE). pt also with plantar flexion contractures at bil ankles and flexion  contractures at knees.  Communication   Communication: Expressive difficulties  Cognition Arousal/Alertness: Awake/alert Behavior During Therapy: WFL for tasks assessed/performed Overall Cognitive Status: History of cognitive impairments - at baseline            General Comments: pt has history of dementia with parkinson's, no family present to provide baseline      General  Comments      Exercises     Assessment/Plan    PT Assessment Patent does not need any further PT services  PT Problem List         PT Treatment Interventions      PT Goals (Current goals can be found in the Care Plan section)  Acute Rehab PT Goals PT Goal Formulation: Patient unable to participate in goal setting    Frequency  1x eval/treat    AM-PAC PT "6 Clicks" Mobility  Outcome Measure Help needed turning from your back to your side while in a flat bed without using bedrails?: Total Help needed moving from lying on your back to sitting on the side of a flat bed without using bedrails?: Total Help needed moving to and from a bed to a chair (including a wheelchair)?: Total Help needed standing up from a chair using your arms (e.g., wheelchair or bedside chair)?: Total Help needed to walk in hospital room?: Total Help needed climbing 3-5 steps with a railing? : Total 6 Click Score: 6    End of Session   Activity Tolerance: Patient tolerated treatment well Patient left: in bed;with bed alarm set;with nursing/sitter in room;with call bell/phone within reach Nurse Communication: Mobility status PT Visit Diagnosis: Muscle weakness (generalized) (M62.81)    Time: 0017-4944 PT Time Calculation (min) (ACUTE ONLY): 17 min   Charges:   PT Evaluation $PT Eval Low Complexity: 1 Low          Verner Mould, DPT Physical Therapist with [facility] [phone]  10/30/2019 3:55 PM

## 2019-10-30 NOTE — Progress Notes (Signed)
Pt repositioned, pain administered and liquids given, pt tol well without coughing. SRP, RN

## 2019-10-30 NOTE — Progress Notes (Signed)
PROGRESS NOTE    MINAL STULLER  DGU:[id number] DOB: Mar 05, 1946 DOA: 10/28/2019 PCP: Richmond Campbell., PA-C    Brief Narrative:  74 y.o. 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 Pt has underlying dementia and cannot provide her own history, only yelling constantly when seen. Family at bedside who states pt recently completed course of abx for PNA and has hx of dysphagia and concerns of aspiration several weeks leading up to ED visit. Pt presents from facility which reportedly has case of COVID outbreak. Per EDP, pt noted to be markedly sob and EMS called. On arrival, pt noted to have O2 sats in the low-80's, improved with 2LNC.   ED Course: In the ED, had CXR with findings initially suggestive of chronic interstitial changes. Follow up CT chest with findings worrisome for viral PNA. Initial COVID testing was neg. Repeat covid test ordered and is pending. Pt was started on empiric azithro and rocephin. Inflammatory markers were ordered and remain pending. Pt has since been weaned to room air. Hospitalist consulted for consideration for admission.  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 Multifocal PNA with sepsis present on admit 1. Chest CT personally reviewed. Findings worrisome for viral PNA 2. Initial COVID testing is neg, however, pt resides at facility with reported recent COVID outbreak 3. Pt currently tachycardic and with tachypnea. Normal WBC 4. Pt reportedly with O2 sats in the low 80's in the field, initially improved to mid-90's on room air 5. Repeat COVID neg 6. Inflammatory markers and procalcitonin ordered  by EDP at presentation, found to have neg procalcitonin with mildly elevated CRP of 3.6 7. Family at bedside. Reports recent hx of aspiration over the past several weeks leading up to admission 8. Was continued on empiric azithromycin, rocephin, and flagyl 9. Recent increased O2 requirements up to [facility] with much higher CRP and CXR findings concerning for multiocal PNA 10. Given continued concerns of covid, pt currently on isolation with recommendations to continue pt on decadron 6mg  daily x 10 days 11. Repeat CRP today down to 12. Procal did peak to 0.23, down to 0.2 2. GERD 1. Will continue on PPI, IV formulation given concerns of possible aspiration 3. Dementia with Parkinson's 1. Will hold parkinson's meds for now until more stable and cleared for PO by SLP 2. Seems more alert this AM, asking for water 4. Hx DVT on chronic anticoagulation 1. On eliquis prior to admit 2. Given concerns of aspiration, holding xarelto and continue therapeutic lovenox 3. No signs of active bleeding 5. Anxiety 1. Was yelling at time of presentation 2. Improved with PRN ativan 6. Hx aspiration 1. Family had reported issues with aspiration over past several weeks 2. SLP consulted per above, pending eval 7. End of Life 1. Discussed with family at bedside at time of admit 2. Family is well aware of pt's wishes for DNR/DNI  DVT prophylaxis: Levonox subq Code Status: DNR Family Communication: Pt in room, family at bedside Disposition Plan: Uncertain at this time  Consultants:   Discussed case with ID over phone  Procedures:     Antimicrobials: Anti-infectives (From admission, onward)   Start  Dose/Rate Route Frequency Ordered Stop   10/30/19 1100  ampicillin-sulbactam (UNASYN) 1.5 g in sodium chloride 0.9 % 100 mL IVPB     1.5 g 200 mL/hr over 30 Minutes Intravenous Every 6 hours 10/30/19 1036     10/30/19 1000  remdesivir 100 mg in sodium chloride 0.9 % 100 mL IVPB  Status:  Discontinued      100 mg 200 mL/hr over 30 Minutes Intravenous Daily 10/29/19 1204 10/29/19 1513   10/29/19 1900  azithromycin (ZITHROMAX) 500 mg in sodium chloride 0.9 % 250 mL IVPB     500 mg 250 mL/hr over 60 Minutes Intravenous Every 24 hours 10/29/19 1845     10/29/19 1600  cefTRIAXone (ROCEPHIN) 1 g in sodium chloride 0.9 % 100 mL IVPB  Status:  Discontinued     1 g 200 mL/hr over 30 Minutes Intravenous Every 24 hours 10/29/19 0944 10/30/19 1036   10/29/19 1400  remdesivir 200 mg in sodium chloride 0.9% 250 mL IVPB  Status:  Discontinued     200 mg 580 mL/hr over 30 Minutes Intravenous Once 10/29/19 1204 10/29/19 1513   10/28/19 2000  metroNIDAZOLE (FLAGYL) IVPB 500 mg  Status:  Discontinued     500 mg 100 mL/hr over 60 Minutes Intravenous Every 8 hours 10/28/19 1714 10/28/19 1907   10/28/19 2000  metroNIDAZOLE (FLAGYL) IVPB 500 mg  Status:  Discontinued     500 mg 100 mL/hr over 60 Minutes Intravenous Every 8 hours 10/28/19 1907 10/30/19 1036   10/28/19 1600  cefTRIAXone (ROCEPHIN) 1 g in sodium chloride 0.9 % 100 mL IVPB     1 g 200 mL/hr over 30 Minutes Intravenous  Once 10/28/19 1553 10/28/19 1728   10/28/19 1600  azithromycin (ZITHROMAX) 500 mg in sodium chloride 0.9 % 250 mL IVPB     500 mg 250 mL/hr over 60 Minutes Intravenous  Once 10/28/19 1553 10/28/19 1728      Subjective: Asking for water today  Objective: Vitals:   10/29/19 1901 10/29/19 2103 10/30/19 0453 10/30/19 1235  BP: (!) 107/54  (!) 108/50 110/64  Pulse: 99 88 (!) 56 88  Resp: 20 (!) 25 20 19   Temp: 98.4 F (36.9 C)  99.4 F (37.4 C) 98.1 F (36.7 C)  TempSrc: Oral   Oral  SpO2: 97% 95% 94% 100%  Weight:      Height:        Intake/Output Summary (Last 24 hours) at 10/30/2019 1633 Last data filed at 10/30/2019 12/28/2019 Gross per 24 hour  Intake 1327.49 ml  Output 150 ml  Net 1177.49 ml   Filed Weights   10/28/19 1129  Weight: 59 kg    Examination: General exam: Conversant, in no acute distress Respiratory  system: normal chest rise, clear, no audible wheezing Cardiovascular system: regular rhythm, s1-s2 Gastrointestinal system: Nondistended, nontender, pos BS Central nervous system: No seizures, no tremors Extremities: No cyanosis, no joint deformities Skin: No rashes, no pallor Psychiatry: seems confused, anxious  Data Reviewed: I have personally reviewed following labs and imaging studies  CBC: Recent Labs  Lab 10/28/19 1152 10/29/19 0541 10/30/19 0253  WBC 9.7 11.6* 5.9  NEUTROABS 7.8*  --   --   HGB 11.2* 10.0* 8.6*  HCT 35.1* 31.6* 29.0*  MCV 102.6* 101.9* 104.7*  PLT 203 191 157   Basic Metabolic Panel: Recent Labs  Lab 10/28/19 1152 10/29/19 0541 10/30/19 0253  NA 141 144 149*  K 4.6 4.3 4.1  CL 100 103 111  CO2  31 29 30   GLUCOSE 143* 136* 101*  BUN 30* 27* 27*  CREATININE 0.77 0.81 0.76  CALCIUM 8.9 8.9 8.6*   GFR: Estimated Creatinine Clearance: 58.3 mL/min (by C-G formula based on SCr of 0.76 mg/dL). Liver Function Tests: Recent Labs  Lab 10/28/19 1152 10/29/19 0541 10/30/19 0253  AST 19 25 24   ALT 7 17 17   ALKPHOS 123 137* 124  BILITOT 0.6 0.4 0.3  PROT 7.4 7.2 6.6  ALBUMIN 3.2* 2.7* 2.6*   No results for input(s): LIPASE, AMYLASE in the last 168 hours. No results for input(s): AMMONIA in the last 168 hours. Coagulation Profile: No results for input(s): INR, PROTIME in the last 168 hours. Cardiac Enzymes: No results for input(s): CKTOTAL, CKMB, CKMBINDEX, TROPONINI in the last 168 hours. BNP (last 3 results) No results for input(s): PROBNP in the last 8760 hours. HbA1C: No results for input(s): HGBA1C in the last 72 hours. CBG: No results for input(s): GLUCAP in the last 168 hours. Lipid Profile: Recent Labs    10/28/19 1732  TRIG 99   Thyroid Function Tests: No results for input(s): TSH, T4TOTAL, FREET4, T3FREE, THYROIDAB in the last 72 hours. Anemia Panel: Recent Labs    10/28/19 1732 10/30/19 0253  FERRITIN 125 151   Sepsis  Labs: Recent Labs  Lab 10/28/19 1152 10/28/19 1732 10/29/19 1738 10/30/19 0253  PROCALCITON  --  <0.10 0.23 0.20  LATICACIDVEN 1.8  --   --   --     Recent Results (from the past 240 hour(s))  Blood culture (routine x 2)     Status: None (Preliminary result)   Collection Time: 10/28/19 11:52 AM   Specimen: BLOOD LEFT HAND  Result Value Ref Range Status   Specimen Description   Final    BLOOD LEFT HAND Performed at [facility], [address] [place], [place] [address]    Special Requests   Final    BOTTLES DRAWN AEROBIC AND ANAEROBIC Blood Culture results may not be optimal due to an inadequate volume of blood received in culture bottles Performed at [facility], [address] [place], [place] [address]    Culture   Final    NO GROWTH 2 DAYS Performed at [facility], [address] [place], [place] [address]    Report Status PENDING  Incomplete  Blood culture (routine x 2)     Status: None (Preliminary result)   Collection Time: 10/28/19 11:52 AM   Specimen: BLOOD RIGHT FOREARM  Result Value Ref Range Status   Specimen Description   Final    BLOOD RIGHT FOREARM Performed at [facility], [address] [place], [place] [address]    Special Requests   Final    BOTTLES DRAWN AEROBIC AND ANAEROBIC Blood Culture adequate volume Performed at [facility], [address] [place], [place] [address]    Culture   Final    NO GROWTH 2 DAYS Performed at [facility], [address] [place], [place] [address]    Report Status PENDING  Incomplete  Urine culture     Status: Abnormal   Collection Time: 10/28/19 11:52 AM   Specimen: Urine, Random  Result Value Ref Range Status   Specimen Description   Final    URINE, RANDOM Performed at [facility], [address] [place], [place] [address]    Special Requests   Final    NONE Performed at [facility], [address] [place], [place] [address]    Culture 50,000 COLONIES/mL ENTEROCOCCUS FAECALIS (A)  Final   Report Status 10/30/2019 FINAL  Final   Organism ID, Bacteria ENTEROCOCCUS FAECALIS (A)  Final      Susceptibility   Enterococcus faecalis - MIC*    AMPICILLIN <=2 SENSITIVE Sensitive     NITROFURANTOIN <=16 SENSITIVE Sensitive     VANCOMYCIN >=32 RESISTANT Resistant     LINEZOLID 1 SENSITIVE Sensitive     * 50,000 COLONIES/mL ENTEROCOCCUS FAECALIS  SARS CORONAVIRUS 2 (TAT 6-24 HRS) Nasopharyngeal Nasopharyngeal Swab     Status: None   Collection Time: 10/28/19  1:19 PM   Specimen: Nasopharyngeal Swab  Result Value Ref Range Status   SARS Coronavirus 2 NEGATIVE NEGATIVE Final    Comment: (NOTE) SARS-CoV-2 target nucleic acids are NOT DETECTED. The SARS-CoV-2 RNA is generally detectable in upper and lower respiratory specimens during the acute phase of infection. Negative results do not preclude SARS-CoV-2 infection, do not rule out co-infections with other pathogens, and should not be used as the sole basis for treatment or other patient management decisions. Negative results must be combined with clinical observations, patient history, and epidemiological information. The expected result is Negative. Fact Sheet for Patients: HairSlick.nohttps://www.fda.gov/media/138098/download Fact Sheet for Healthcare Providers: quierodirigir.comhttps://www.fda.gov/media/138095/download This test is not yet approved or cleared by the [place] FDA and  has been authorized for detection and/or diagnosis of SARS-CoV-2 by FDA under an Emergency Use Authorization (EUA). This EUA will remain  in effect (meaning this test can be used) for the duration of the COVID-19 declaration under Section 56 4(b)(1) of the Act, 21 U.S.C. section 360bbb-3(b)(1), unless the authorization is terminated or revoked sooner. Performed at [facility], [address] [place], [place] [address]   Respiratory Panel by  RT PCR (Flu A&B, Covid) - Nasopharyngeal Swab     Status: None   Collection Time: 10/29/19 12:00 PM   Specimen: Nasopharyngeal Swab  Result Value Ref Range Status   SARS Coronavirus 2 by RT PCR NEGATIVE NEGATIVE Final    Comment: (NOTE) SARS-CoV-2 target nucleic acids are NOT DETECTED. The SARS-CoV-2 RNA is generally detectable in upper respiratoy specimens during the acute phase of infection. The lowest concentration of SARS-CoV-2 viral copies this assay can detect is 131 copies/mL. A negative result does not preclude SARS-Cov-2 infection and should not be used as the sole basis for treatment or other patient management decisions. A negative result may occur with  improper specimen collection/handling, submission of specimen other than nasopharyngeal swab, presence of viral mutation(s) within the areas targeted by this assay, and inadequate number of viral copies (<131 copies/mL). A negative result must be combined with clinical observations, patient history, and epidemiological information. The expected result is Negative. Fact Sheet for Patients:  https://www.moore.com/https://www.fda.gov/media/142436/download Fact Sheet for Healthcare Providers:  https://www.young.biz/https://www.fda.gov/media/142435/download This test is not yet ap proved or cleared by the [place] FDA and  has been authorized for detection and/or diagnosis of SARS-CoV-2 by FDA under an Emergency Use Authorization (EUA). This EUA will remain  in effect (meaning this test can be used) for the duration of the COVID-19 declaration under Section 564(b)(1) of the Act, 21 U.S.C. section 360bbb-3(b)(1), unless the authorization is terminated or revoked sooner.    Influenza A by PCR NEGATIVE NEGATIVE Final   Influenza B by PCR NEGATIVE NEGATIVE Final    Comment: (NOTE) The Xpert Xpress SARS-CoV-2/FLU/RSV assay is intended as an aid in  the diagnosis of influenza  from Nasopharyngeal swab specimens and  should not be used as a sole basis for treatment.  Nasal washings and  aspirates are unacceptable for Xpert Xpress SARS-CoV-2/FLU/RSV  testing. Fact Sheet for Patients: https://www.moore.com/ Fact Sheet for Healthcare Providers: https://www.young.biz/ This test is not yet approved or cleared by the [place] FDA and  has been authorized for detection and/or diagnosis of SARS-CoV-2 by  FDA under an Emergency Use Authorization (EUA). This EUA will remain  in effect (meaning this test can be used) for the duration of the  Covid-19 declaration under Section 564(b)(1) of the Act, 21  U.S.C. section 360bbb-3(b)(1), unless the authorization is  terminated or revoked. Performed at [facility], [address] [place], [place] [address]   Respiratory Panel by PCR     Status: None   Collection Time: 10/29/19  6:30 PM   Specimen: Nasopharyngeal Swab; Respiratory  Result Value Ref Range Status   Adenovirus NOT DETECTED NOT DETECTED Final   Coronavirus 229E NOT DETECTED NOT DETECTED Final    Comment: (NOTE) The Coronavirus on the Respiratory Panel, DOES NOT test for the novel  Coronavirus (2019 nCoV)    Coronavirus HKU1 NOT DETECTED NOT DETECTED Final   Coronavirus NL63 NOT DETECTED NOT DETECTED Final   Coronavirus OC43 NOT DETECTED NOT DETECTED Final   Metapneumovirus NOT DETECTED NOT DETECTED Final   Rhinovirus / Enterovirus NOT DETECTED NOT DETECTED Final   Influenza A NOT DETECTED NOT DETECTED Final   Influenza B NOT DETECTED NOT DETECTED Final   Parainfluenza Virus 1 NOT DETECTED NOT DETECTED Final   Parainfluenza Virus 2 NOT DETECTED NOT DETECTED Final   Parainfluenza Virus 3 NOT DETECTED NOT DETECTED Final   Parainfluenza Virus 4 NOT DETECTED NOT DETECTED Final   Respiratory Syncytial Virus NOT DETECTED NOT DETECTED Final   Bordetella pertussis NOT DETECTED NOT DETECTED Final   Chlamydophila pneumoniae NOT DETECTED NOT DETECTED Final   Mycoplasma pneumoniae NOT DETECTED  NOT DETECTED Final    Comment: Performed at [facility], [address], [place], [place] [address]     Radiology Studies: DG CHEST PORT 1 VIEW  Result Date: 10/29/2019 CLINICAL DATA:  In the ED, had CXR with findings initially suggestive of chronic interstitial changes. Follow up CT chest with findings worrisome for viral PNA. Initial COVID testing was neg. Repeat covid test ordered and is pending. EXAM: PORTABLE CHEST 1 VIEW COMPARISON:  Chest radiograph 10/28/2019, chest CT 10/28/2019 FINDINGS: Stable cardiomediastinal contours. There are patchy opacities throughout the left mid to lower lung as well as more subtly in the right upper lobe which are suspicious for multifocal infection. No pneumothorax or large pleural effusion. No acute finding in the visualized skeleton. IMPRESSION: Patchy opacities predominantly throughout the left mid to lower lung and to a lesser extent in the right upper lobe suspicious for multifocal infection. Electronically Signed   By: Emmaline Kluver M.D.   On: 10/29/2019 14:01    Scheduled Meds: . vitamin C  1,000 mg Oral Daily  . dexamethasone (DECADRON) injection  6 mg Intravenous Q24H  . enoxaparin (LOVENOX) injection  1 mg/kg Subcutaneous BID  . pantoprazole (PROTONIX) IV  40 mg Intravenous Q24H  . zinc sulfate  220 mg Oral Daily   Continuous Infusions: . sodium chloride 100 mL/hr at 10/30/19 1219  . ampicillin-sulbactam (UNASYN) IV 1.5 g (10/30/19 1217)  . azithromycin 500 mg (10/29/19 2103)     LOS: 2 days   Rickey Barbara, MD [facility] Pager  On Amion  If 7PM-7AM, please contact night-coverage 10/30/2019, 4:33 PM

## 2019-10-30 NOTE — Progress Notes (Signed)
Pt more alert this afternoon asking for water. Ice chips given, pt tol well without coughing. Gave pt small spooned sips of water and flavor ice chips(apple juice) tol well. Pt continue to cry out for liquids. Pt also talked about her son and past events.  Will cont to monitor. SRP, RN

## 2019-10-31 DIAGNOSIS — R918 Other nonspecific abnormal finding of lung field: Secondary | ICD-10-CM

## 2019-10-31 LAB — FERRITIN: Ferritin: 167 ng/mL (ref 11–307)

## 2019-10-31 LAB — COMPREHENSIVE METABOLIC PANEL
ALT: 19 U/L (ref 0–44)
AST: 25 U/L (ref 15–41)
Albumin: 2.4 g/dL — ABNORMAL LOW (ref 3.5–5.0)
Alkaline Phosphatase: 102 U/L (ref 38–126)
Anion gap: 12 (ref 5–15)
BUN: 27 mg/dL — ABNORMAL HIGH (ref 8–23)
CO2: 23 mmol/L (ref 22–32)
Calcium: 8.2 mg/dL — ABNORMAL LOW (ref 8.9–10.3)
Chloride: 110 mmol/L (ref 98–111)
Creatinine, Ser: 0.68 mg/dL (ref 0.44–1.00)
GFR calc Af Amer: >60 mL/min (ref >60)
GFR calc non Af Amer: >60 mL/min (ref >60)
Glucose, Bld: 82 mg/dL (ref 70–99)
Potassium: 3.7 mmol/L (ref 3.5–5.1)
Sodium: 145 mmol/L (ref 135–145)
Total Bilirubin: 0.9 mg/dL (ref 0.3–1.2)
Total Protein: 5.7 g/dL — ABNORMAL LOW (ref 6.5–8.1)

## 2019-10-31 LAB — PROCALCITONIN: Procalcitonin: 0.13 ng/mL

## 2019-10-31 LAB — CBC
HCT: 27.1 % — ABNORMAL LOW (ref 36.0–46.0)
Hemoglobin: 8.1 g/dL — ABNORMAL LOW (ref 12.0–15.0)
MCH: 31.5 pg (ref 26.0–34.0)
MCHC: 29.9 g/dL — ABNORMAL LOW (ref 30.0–36.0)
MCV: 105.4 fL — ABNORMAL HIGH (ref 80.0–100.0)
Platelets: 177 10*3/uL (ref 150–400)
RBC: 2.57 MIL/uL — ABNORMAL LOW (ref 3.87–5.11)
RDW: 13.3 % (ref 11.5–15.5)
WBC: 5.4 10*3/uL (ref 4.0–10.5)
nRBC: 0 % (ref 0.0–0.2)

## 2019-10-31 LAB — C-REACTIVE PROTEIN: CRP: 7 mg/dL — ABNORMAL HIGH (ref <1.0)

## 2019-10-31 MED ORDER — RESOURCE THICKENUP CLEAR PO POWD
ORAL | Status: DC | PRN
  Filled 2019-10-31: qty 125

## 2019-10-31 NOTE — Progress Notes (Signed)
PROGRESS NOTE    Becky Figueroa  OHY:[id number] DOB: Jul 29, 1946 DOA: 10/28/2019 PCP: Aletha Halim., PA-C    Brief Narrative:  74 y.o. 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 Pt has underlying dementia and cannot provide her own history, only yelling constantly when seen. Family at bedside who states pt recently completed course of abx for PNA and has hx of dysphagia and concerns of aspiration several weeks leading up to ED visit. Pt presents from facility which reportedly has case of COVID outbreak. Per EDP, pt noted to be markedly sob and EMS called. On arrival, pt noted to have O2 sats in the low-80's, improved with 2LNC.   ED Course: In the ED, had CXR with findings initially suggestive of chronic interstitial changes. Follow up CT chest with findings worrisome for viral PNA. Initial COVID testing was neg. Repeat covid test ordered and is pending. Pt was started on empiric azithro and rocephin. Inflammatory markers were ordered and remain pending. Pt has since been weaned to room air. Hospitalist consulted for consideration for admission.  Assessment & Plan:   Principal Problem:   Viral pneumonia Active Problems:   GERD   Dementia in Parkinson's disease ([place])   Parkinson's disease ([place])   DVT (deep venous thrombosis) ([place])  1. Multifocal PNA with sepsis present on admit 1. Chest CT personally reviewed. Findings worrisome for viral PNA 2. Initial COVID testing is neg, however, pt resides at facility with reported recent [place] outbreak 3. Pt currently tachycardic and with tachypnea. Normal WBC 4. Pt reportedly with O2 sats in the low 80's in the field, initially improved to mid-90's on room air 5. Repeat COVID neg 6. Inflammatory markers and procalcitonin ordered  by EDP at presentation, found to have neg procalcitonin with mildly elevated CRP of 3.6 7. Family at bedside. Reports recent hx of aspiration over the past several weeks leading up to admission 8. Was continued on empiric azithromycin, rocephin, and flagyl, now on unasyn per below which would cover aspiration PNA 9. Recent increased O2 requirements up to [facility] with much higher CRP and CXR findings concerning for multiocal PNA 10. Given continued concerns of covid, pt currently on isolation with recommendations to continue pt on decadron 6mg  daily x 10 days 11. Serial CRP shows gradual trend down. Procal trending down 12. Discussed with ID, likely not COVID and OK to transfer to regular floor off isolation 2. GERD 1. Will continue on PPI, IV formulation given concerns of possible aspiration 2. Remains NPO at this time 3. Dementia with Parkinson's 1. Will hold parkinson's meds for now until more stable and cleared for PO by SLP 2. More alert and conversant today. Asking for ice chips 4. Hx DVT on chronic anticoagulation 1. On eliquis prior to admit 2. Given concerns of aspiration, holding xarelto and continue therapeutic lovenox 3. Without signs of bleeding at this time 5. Anxiety 1. Was yelling at time of presentation 2. Seems much improved this AM 6. Hx aspiration 1. Family had reported issues with aspiration over past several weeks prior to admission 2. SLP consulted per above, pending eval 3. Currently remains NPO 7. End of Life 1. Discussed with family at bedside at time of admit 2. Family is well aware of pt's wishes for DNR/DNI  DVT prophylaxis: Levonox subq Code Status: DNR Family Communication:  Pt in room, family over phone Disposition Plan: Uncertain at this time  Consultants:   ID  Procedures:     Antimicrobials: Anti-infectives (From admission, onward)   Start     Dose/Rate Route Frequency Ordered Stop   10/30/19 1100  ampicillin-sulbactam (UNASYN) 1.5 g in  sodium chloride 0.9 % 100 mL IVPB     1.5 g 200 mL/hr over 30 Minutes Intravenous Every 6 hours 10/30/19 1036     10/30/19 1000  remdesivir 100 mg in sodium chloride 0.9 % 100 mL IVPB  Status:  Discontinued     100 mg 200 mL/hr over 30 Minutes Intravenous Daily 10/29/19 1204 10/29/19 1513   10/29/19 1900  azithromycin (ZITHROMAX) 500 mg in sodium chloride 0.9 % 250 mL IVPB     500 mg 250 mL/hr over 60 Minutes Intravenous Every 24 hours 10/29/19 1845     10/29/19 1600  cefTRIAXone (ROCEPHIN) 1 g in sodium chloride 0.9 % 100 mL IVPB  Status:  Discontinued     1 g 200 mL/hr over 30 Minutes Intravenous Every 24 hours 10/29/19 0944 10/30/19 1036   10/29/19 1400  remdesivir 200 mg in sodium chloride 0.9% 250 mL IVPB  Status:  Discontinued     200 mg 580 mL/hr over 30 Minutes Intravenous Once 10/29/19 1204 10/29/19 1513   10/28/19 2000  metroNIDAZOLE (FLAGYL) IVPB 500 mg  Status:  Discontinued     500 mg 100 mL/hr over 60 Minutes Intravenous Every 8 hours 10/28/19 1714 10/28/19 1907   10/28/19 2000  metroNIDAZOLE (FLAGYL) IVPB 500 mg  Status:  Discontinued     500 mg 100 mL/hr over 60 Minutes Intravenous Every 8 hours 10/28/19 1907 10/30/19 1036   10/28/19 1600  cefTRIAXone (ROCEPHIN) 1 g in sodium chloride 0.9 % 100 mL IVPB     1 g 200 mL/hr over 30 Minutes Intravenous  Once 10/28/19 1553 10/28/19 1728   10/28/19 1600  azithromycin (ZITHROMAX) 500 mg in sodium chloride 0.9 % 250 mL IVPB     500 mg 250 mL/hr over 60 Minutes Intravenous  Once 10/28/19 1553 10/28/19 1728      Subjective: Asking for ice chips  Objective: Vitals:   10/30/19 1235 10/30/19 2200 10/31/19 0428 10/31/19 1133  BP: 110/64 (!) 119/55 (!) 107/59 (!) 118/49  Pulse: 88 80 77 65  Resp: 19 20 16 20   Temp: 98.1 F (36.7 C) 98.6 F (37 C) 98.2 F (36.8 C) 97.6 F (36.4 C)  TempSrc: Oral Axillary Oral Oral  SpO2: 100% 99% 100% 100%  Weight:      Height:        Intake/Output Summary (Last 24 hours) at 10/31/2019  1438 Last data filed at 10/31/2019 0600 Gross per 24 hour  Intake 885.9 ml  Output 1 ml  Net 884.9 ml   Filed Weights   10/28/19 1129  Weight: 59 kg    Examination: General exam: Awake, laying in bed, in nad Respiratory system: Normal respiratory effort, no wheezing Cardiovascular system: regular rate, s1, s2 Gastrointestinal system: Soft, nondistended, positive BS Central nervous system: CN2-12 grossly intact, strength intact Extremities: Perfused, no clubbing Skin: Normal skin turgor, no notable skin lesions seen Psychiatry: Difficult to assess given current mentation  Data Reviewed: I have personally reviewed following labs and imaging studies  CBC: Recent Labs  Lab 10/28/19 1152 10/29/19 0541 10/30/19 0253 10/31/19 0305  WBC 9.7 11.6* 5.9 5.4  NEUTROABS 7.8*  --   --   --   HGB 11.2*  10.0* 8.6* 8.1*  HCT 35.1* 31.6* 29.0* 27.1*  MCV 102.6* 101.9* 104.7* 105.4*  PLT 203 191 157 177   Basic Metabolic Panel: Recent Labs  Lab 10/28/19 1152 10/29/19 0541 10/30/19 0253 10/31/19 0305  NA 141 144 149* 145  K 4.6 4.3 4.1 3.7  CL 100 103 111 110  CO2 31 29 30 23   GLUCOSE 143* 136* 101* 82  BUN 30* 27* 27* 27*  CREATININE 0.77 0.81 0.76 0.68  CALCIUM 8.9 8.9 8.6* 8.2*   GFR: Estimated Creatinine Clearance: 58.3 mL/min (by C-G formula based on SCr of 0.68 mg/dL). Liver Function Tests: Recent Labs  Lab 10/28/19 1152 10/29/19 0541 10/30/19 0253 10/31/19 0305  AST 19 25 24 25   ALT 7 17 17 19   ALKPHOS 123 137* 124 102  BILITOT 0.6 0.4 0.3 0.9  PROT 7.4 7.2 6.6 5.7*  ALBUMIN 3.2* 2.7* 2.6* 2.4*   No results for input(s): LIPASE, AMYLASE in the last 168 hours. No results for input(s): AMMONIA in the last 168 hours. Coagulation Profile: No results for input(s): INR, PROTIME in the last 168 hours. Cardiac Enzymes: No results for input(s): CKTOTAL, CKMB, CKMBINDEX, TROPONINI in the last 168 hours. BNP (last 3 results) No results for input(s): PROBNP in the  last 8760 hours. HbA1C: No results for input(s): HGBA1C in the last 72 hours. CBG: No results for input(s): GLUCAP in the last 168 hours. Lipid Profile: Recent Labs    10/28/19 1732  TRIG 99   Thyroid Function Tests: No results for input(s): TSH, T4TOTAL, FREET4, T3FREE, THYROIDAB in the last 72 hours. Anemia Panel: Recent Labs    10/30/19 0253 10/31/19 0305  FERRITIN 151 167   Sepsis Labs: Recent Labs  Lab 10/28/19 1152 10/28/19 1732 10/29/19 1738 10/30/19 0253 10/31/19 0305  PROCALCITON  --  <0.10 0.23 0.20 0.13  LATICACIDVEN 1.8  --   --   --   --     Recent Results (from the past 240 hour(s))  Blood culture (routine x 2)     Status: None (Preliminary result)   Collection Time: 10/28/19 11:52 AM   Specimen: BLOOD LEFT HAND  Result Value Ref Range Status   Specimen Description   Final    BLOOD LEFT HAND Performed at [facility], [address], [place], [place]    Special Requests   Final    BOTTLES DRAWN AEROBIC AND ANAEROBIC Blood Culture results may not be optimal due to an inadequate volume of blood received in culture bottles Performed at [facility], [address] [place], [place]    Culture   Final    NO GROWTH 3 DAYS Performed at [facility], [address], [place], [facility] [address]    Report Status PENDING  Incomplete  Blood culture (routine x 2)     Status: None (Preliminary result)   Collection Time: 10/28/19 11:52 AM   Specimen: BLOOD RIGHT FOREARM  Result Value Ref Range Status   Specimen Description   Final    BLOOD RIGHT FOREARM Performed at [facility], [address] [place], [place]    Special Requests   Final    BOTTLES DRAWN AEROBIC AND ANAEROBIC Blood Culture adequate volume Performed at [facility], [address], [place], [place]    Culture   Final    NO GROWTH 3 DAYS Performed at [facility], [address], [place], [facility]  [address]    Report Status PENDING  Incomplete  Urine culture     Status: Abnormal   Collection Time: 10/28/19 11:52 AM   Specimen: Urine, Random  Result Value Ref Range Status   Specimen Description   Final    URINE, RANDOM Performed at [facility], [address] [place], [place] [address]    Special Requests   Final    NONE Performed at [facility], [address] [place], [place] [address]    Culture 50,000 COLONIES/mL ENTEROCOCCUS FAECALIS (A)  Final   Report Status 10/30/2019 FINAL  Final   Organism ID, Bacteria ENTEROCOCCUS FAECALIS (A)  Final      Susceptibility   Enterococcus faecalis - MIC*    AMPICILLIN <=2 SENSITIVE Sensitive     NITROFURANTOIN <=16 SENSITIVE Sensitive     VANCOMYCIN >=32 RESISTANT Resistant     LINEZOLID 1 SENSITIVE Sensitive     * 50,000 COLONIES/mL ENTEROCOCCUS FAECALIS  SARS CORONAVIRUS 2 (TAT 6-24 HRS) Nasopharyngeal Nasopharyngeal Swab     Status: None   Collection Time: 10/28/19  1:19 PM   Specimen: Nasopharyngeal Swab  Result Value Ref Range Status   SARS Coronavirus 2 NEGATIVE NEGATIVE Final    Comment: (NOTE) SARS-CoV-2 target nucleic acids are NOT DETECTED. The SARS-CoV-2 RNA is generally detectable in upper and lower respiratory specimens during the acute phase of infection. Negative results do not preclude SARS-CoV-2 infection, do not rule out co-infections with other pathogens, and should not be used as the sole basis for treatment or other patient management decisions. Negative results must be combined with clinical observations, patient history, and epidemiological information. The expected result is Negative. Fact Sheet for Patients: HairSlick.no Fact Sheet for Healthcare Providers: quierodirigir.com This test is not yet approved or cleared by the [place] FDA and  has been authorized  for detection and/or diagnosis of SARS-CoV-2 by FDA under an Emergency Use Authorization (EUA). This EUA will remain  in effect (meaning this test can be used) for the duration of the COVID-19 declaration under Section 56 4(b)(1) of the Act, 21 U.S.C. section 360bbb-3(b)(1), unless the authorization is terminated or revoked sooner. Performed at [facility], [address], [place], [place] [address]   Respiratory Panel by RT PCR (Flu A&B, Covid) - Nasopharyngeal Swab     Status: None   Collection Time: 10/29/19 12:00 PM   Specimen: Nasopharyngeal Swab  Result Value Ref Range Status   SARS Coronavirus 2 by RT PCR NEGATIVE NEGATIVE Final    Comment: (NOTE) SARS-CoV-2 target nucleic acids are NOT DETECTED. The SARS-CoV-2 RNA is generally detectable in upper respiratoy specimens during the acute phase of infection. The lowest concentration of SARS-CoV-2 viral copies this assay can detect is 131 copies/mL. A negative result does not preclude SARS-Cov-2 infection and should not be used as the sole basis for treatment or other patient management decisions. A negative result may occur with  improper specimen collection/handling, submission of specimen other than nasopharyngeal swab, presence of viral mutation(s) within the areas targeted by this assay, and inadequate number of viral copies (<131 copies/mL). A negative result must be combined with clinical observations, patient history, and epidemiological information. The expected result is Negative. Fact Sheet for Patients:  https://www.moore.com/ Fact Sheet for Healthcare Providers:  https://www.young.biz/ This test is not yet ap proved or cleared by the [place] FDA and  has been authorized for detection and/or diagnosis of SARS-CoV-2 by FDA under an Emergency Use Authorization (EUA).  This EUA will remain  in effect (meaning this test can be used) for the duration of the COVID-19  declaration under Section 564(b)(1) of the Act, 21 U.S.C. section 360bbb-3(b)(1), unless the authorization is terminated or revoked sooner.    Influenza A by PCR NEGATIVE NEGATIVE Final   Influenza B by PCR NEGATIVE NEGATIVE Final    Comment: (NOTE) The Xpert Xpress SARS-CoV-2/FLU/RSV assay is intended as an aid in  the diagnosis of influenza from Nasopharyngeal swab specimens and  should not be used as a sole basis for treatment. Nasal washings and  aspirates are unacceptable for Xpert Xpress SARS-CoV-2/FLU/RSV  testing. Fact Sheet for Patients: https://www.moore.com/https://www.fda.gov/media/142436/download Fact Sheet for Healthcare Providers: https://www.young.biz/https://www.fda.gov/media/142435/download This test is not yet approved or cleared by the [place] FDA and  has been authorized for detection and/or diagnosis of SARS-CoV-2 by  FDA under an Emergency Use Authorization (EUA). This EUA will remain  in effect (meaning this test can be used) for the duration of the  Covid-19 declaration under Section 564(b)(1) of the Act, 21  U.S.C. section 360bbb-3(b)(1), unless the authorization is  terminated or revoked. Performed at [facility], [address] [place], [place] [address]   Respiratory Panel by PCR     Status: None   Collection Time: 10/29/19  6:30 PM   Specimen: Nasopharyngeal Swab; Respiratory  Result Value Ref Range Status   Adenovirus NOT DETECTED NOT DETECTED Final   Coronavirus 229E NOT DETECTED NOT DETECTED Final    Comment: (NOTE) The Coronavirus on the Respiratory Panel, DOES NOT test for the novel  Coronavirus (2019 nCoV)    Coronavirus HKU1 NOT DETECTED NOT DETECTED Final   Coronavirus NL63 NOT DETECTED NOT DETECTED Final   Coronavirus OC43 NOT DETECTED NOT DETECTED Final   Metapneumovirus NOT DETECTED NOT DETECTED Final   Rhinovirus / Enterovirus NOT DETECTED NOT DETECTED Final   Influenza A NOT DETECTED NOT DETECTED Final   Influenza B NOT DETECTED NOT DETECTED Final     Parainfluenza Virus 1 NOT DETECTED NOT DETECTED Final   Parainfluenza Virus 2 NOT DETECTED NOT DETECTED Final   Parainfluenza Virus 3 NOT DETECTED NOT DETECTED Final   Parainfluenza Virus 4 NOT DETECTED NOT DETECTED Final   Respiratory Syncytial Virus NOT DETECTED NOT DETECTED Final   Bordetella pertussis NOT DETECTED NOT DETECTED Final   Chlamydophila pneumoniae NOT DETECTED NOT DETECTED Final   Mycoplasma pneumoniae NOT DETECTED NOT DETECTED Final    Comment: Performed at [facility], [address] [place], [place] [address]     Radiology Studies: No results found.  Scheduled Meds: . vitamin C  1,000 mg Oral Daily  . dexamethasone (DECADRON) injection  6 mg Intravenous Q24H  . enoxaparin (LOVENOX) injection  1 mg/kg Subcutaneous BID  . pantoprazole (PROTONIX) IV  40 mg Intravenous Q24H  . zinc sulfate  220 mg Oral Daily   Continuous Infusions: . sodium chloride 100 mL/hr at 10/31/19 1435  . ampicillin-sulbactam (UNASYN) IV 1.5 g (10/31/19 1057)  . azithromycin 500 mg (10/30/19 1739)     LOS: 3 days   Rickey BarbaraStephen Sophiarose Eades, MD [facility] Pager On Amion  If 7PM-7AM, please contact night-coverage 10/31/2019, 2:38 PM

## 2019-10-31 NOTE — Evaluation (Signed)
Clinical/Bedside Swallow Evaluation Patient Details  Name: Becky Figueroa MRN: [id number] Date of Birth: Aug 17, 1946  Today's Date: 10/31/2019 Time: SLP Start Time (ACUTE ONLY): 1350 SLP Stop Time (ACUTE ONLY): 1420 SLP Time Calculation (min) (ACUTE ONLY): 30 min  Past Medical History:  Past Medical History:  Diagnosis Date  . Anal stenosis   . Anxiety   . Decubitus ulcer   . 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 Depression   . Fatty liver   . GERD (gastroesophageal reflux disease)   . Hiatal hernia   . History of anal fissures   . History of chronic constipation   . History of kidney stones    1973  . Hyperlipidemia   . Left ureteral calculus   . Parkinson's disease (HCC)   . Wears glasses    Past Surgical History:  Past Surgical History:  Procedure Laterality Date  . ANAL FISSURECTOMY  1994   and Hemorrhoidectomy  . BOTOX INJECTION N/A 11/18/2013   Procedure: BOTOX INJECTION;  Surgeon: Hart Carwin, MD;  Location: [facility];  Service: Endoscopy;  Laterality: N/A;  . CHOLECYSTECTOMY OPEN  1982  . CYSTOSCOPY WITH RETROGRADE PYELOGRAM, URETEROSCOPY AND STENT PLACEMENT Left 06/06/2016   Procedure: CYSTOSCOPY WITH RETROGRADE PYELOGRAM, URETEROSCOPY AND STENT PLACEMENT;  Surgeon: Barron Alvine, MD;  Location: [facility];  Service: Urology;  Laterality: Left;  . FLEXIBLE SIGMOIDOSCOPY N/A 11/18/2013   Procedure: FLEXIBLE SIGMOIDOSCOPY/ with BOTOX;  Surgeon: Hart Carwin, MD;  Location: [facility];  Service: Endoscopy;  Laterality: N/A;  . HOLMIUM LASER APPLICATION Left 06/06/2016   Procedure: HOLMIUM LASER APPLICATION;  Surgeon: Barron Alvine, MD;  Location: [facility];  Service: Urology;  Laterality: Left;  . IRRIGATION AND DEBRIDEMENT BUTTOCKS N/A 12/02/2018   Procedure: IRRIGATION AND DEBRIDEMENT SACRAL DECUBITUS;  Surgeon: Karie Soda, MD;  Location: [facility];  Service: General;  Laterality: N/A;  . TONSILLECTOMY  age 66  .  TOTAL ABDOMINAL HYSTERECTOMY W/ BILATERAL SALPINGOOPHORECTOMY  1984   and Appendectomy   HPI:  74 y.o. 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  Family states pt recently completed course of abx for PNA and has hx of dysphagia and concerns of aspiration several weeks leading up to ED visit.  Pt seen multiple times by ST in the past year with the most recent recommendations for Dysphagia 3 (soft) solids and thin liquids.  CXR on 10/29/19 remarkable for "Patchy opacities predominantly throughout the left mid to lower lung and to a lesser extent in the right upper lobe suspicious for multifocal infection."     Assessment / Plan / Recommendation Clinical Impression  Pt presents with oral dysphagia and suspected pharyngeal dysphagia.  She was encountered awake/alert upon SLP arrival and she had intermittent hallucinations throughout this evaluation.  Observed wet vocal quality which pt was able to clear with a cued cough and subsequent swallow.  Oral mech exam was significant for loose fitting top dentures, generalized oral weakness, and lower facial trembling.  Pt consumed trials of ice chips, thin liquid, nectar-thick liquid, puree, and regular solids.  She exhibited good bolus acceptance with all trials.  Wet vocal quality was observed following ice chip and thin liquid trials and pt was observed to have an immediate, weak, wet cough following 3/3 thin liquid trials via straw sip.  No clinical s/sx of aspiration were observed with nectar-thick liquid trials despite challenging.  Pt with prolonged mastication of regular solids and prolonged AP transport of puree and regular solids.  Recommend initiation of Dysphagia 2 (fine chop) solids and nectar-thick liquids with the following precautions: 1) Small  bites/sips 2) Slow rate of intake 3) Sit upright 90 degrees 4) Limit distractions during po intake.  Pt will benefit from full supervision during meals to assist with po intake and to cue for compensatory strategies.  SLP will f/u per POC  SLP Visit Diagnosis: Dysphagia, unspecified (R13.10)    Aspiration Risk  Moderate aspiration risk    Diet Recommendation Dysphagia 2 (Fine chop);Nectar-thick liquid   Liquid Administration via: Straw;Cup Medication Administration: Whole meds with puree(cut/crush large pills ) Supervision: Full supervision/cueing for compensatory strategies;Staff to assist with self feeding Compensations: Minimize environmental distractions;Slow rate;Small sips/bites Postural Changes: Seated upright at 90 degrees    Other  Recommendations Oral Care Recommendations: Oral care BID;Staff/trained caregiver to provide oral care   Follow up Recommendations Skilled Nursing facility      Frequency and Duration min 2x/week  2 weeks       Prognosis Prognosis for Safe Diet Advancement: Fair Barriers to Reach Goals: Cognitive deficits      Swallow Study   General Date of Onset: 10/30/19 HPI: 74 y.o. 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  Family states pt recently completed course of abx for PNA and has hx of dysphagia and concerns of aspiration several weeks leading up to ED visit.  Pt seen multiple times by ST in the past year with the most recent recommendations for Dysphagia 3 (soft) solids and thin liquids.  CXR on 10/29/19 remarkable for "Patchy opacities predominantly throughout the left mid to lower lung and to a lesser extent in the right upper lobe suspicious for multifocal infection."   Type of Study: Bedside Swallow Evaluation Previous Swallow Assessment:  BSE on 12/08/18 Diet Prior to this Study: NPO Temperature Spikes Noted: Yes Respiratory Status: Nasal cannula History of Recent Intubation: No Behavior/Cognition: Alert;Cooperative;Pleasant mood;Confused Oral Cavity Assessment: Within Functional Limits Oral Care Completed by SLP: No Oral Cavity - Dentition: Dentures, top;Missing dentition(natural dentition on bottom ) Self-Feeding Abilities: Total assist Patient Positioning: Upright in bed Baseline Vocal Quality: Low vocal intensity Volitional Cough: Weak Volitional Swallow: Unable to elicit    Oral/Motor/Sensory Function Overall Oral Motor/Sensory Function: Generalized oral weakness   Ice Chips Ice chips: Impaired Presentation: Spoon Oral Phase Impairments: Impaired mastication Oral Phase Functional Implications: Prolonged oral transit Pharyngeal Phase Impairments: Suspected delayed Swallow;Wet Vocal Quality   Thin Liquid Thin Liquid: Impaired Presentation: Spoon;Straw Oral Phase Functional Implications: Prolonged oral transit Pharyngeal  Phase Impairments: Suspected delayed Swallow;Multiple swallows;Cough - Immediate    Nectar Thick Nectar Thick Liquid: Impaired Presentation: Straw Pharyngeal Phase Impairments: Suspected delayed Swallow;Multiple swallows   Honey Thick Honey Thick Liquid: Not tested   Puree Puree: Impaired Presentation: Spoon Oral Phase Functional Implications: Prolonged oral transit   Solid     Solid: Impaired Presentation: Spoon Oral Phase Impairments: Impaired mastication Oral Phase Functional Implications: Impaired mastication;Oral residue;Prolonged oral transit     Becky Figueroa M.S., CCC-SLP Acute Rehabilitation Services Office: [phone]  Elvia Collum Philip Kotlyar 10/31/2019,2:38 PM

## 2019-10-31 NOTE — Consult Note (Signed)
I have spoken with pt's PCP, reviewed patient's chart.  Her CXR findings and CT findings seem similar to previous findings with consideration of fibrosis and had patchy infiltrates in February 2020 as well.   Therefore, with negative COVID testing x 3, I do not feel COVID isolation is indicated and COVID precautions are being d/c.  Gardiner Barefoot, MD

## 2019-11-01 LAB — CBC
HCT: 27.4 % — ABNORMAL LOW (ref 36.0–46.0)
Hemoglobin: 8.5 g/dL — ABNORMAL LOW (ref 12.0–15.0)
MCH: 32 pg (ref 26.0–34.0)
MCHC: 31 g/dL (ref 30.0–36.0)
MCV: 103 fL — ABNORMAL HIGH (ref 80.0–100.0)
Platelets: 194 10*3/uL (ref 150–400)
RBC: 2.66 MIL/uL — ABNORMAL LOW (ref 3.87–5.11)
RDW: 13.1 % (ref 11.5–15.5)
WBC: 5.7 10*3/uL (ref 4.0–10.5)
nRBC: 0 % (ref 0.0–0.2)

## 2019-11-01 LAB — COMPREHENSIVE METABOLIC PANEL
ALT: 20 U/L (ref 0–44)
AST: 25 U/L (ref 15–41)
Albumin: 2.3 g/dL — ABNORMAL LOW (ref 3.5–5.0)
Alkaline Phosphatase: 98 U/L (ref 38–126)
Anion gap: 7 (ref 5–15)
BUN: 23 mg/dL (ref 8–23)
CO2: 24 mmol/L (ref 22–32)
Calcium: 7.8 mg/dL — ABNORMAL LOW (ref 8.9–10.3)
Chloride: 112 mmol/L — ABNORMAL HIGH (ref 98–111)
Creatinine, Ser: 0.62 mg/dL (ref 0.44–1.00)
GFR calc Af Amer: >60 mL/min (ref >60)
GFR calc non Af Amer: >60 mL/min (ref >60)
Glucose, Bld: 115 mg/dL — ABNORMAL HIGH (ref 70–99)
Potassium: 2.5 mmol/L — CL (ref 3.5–5.1)
Sodium: 143 mmol/L (ref 135–145)
Total Bilirubin: 0.3 mg/dL (ref 0.3–1.2)
Total Protein: 5.8 g/dL — ABNORMAL LOW (ref 6.5–8.1)

## 2019-11-01 LAB — MAGNESIUM: Magnesium: 2 mg/dL (ref 1.7–2.4)

## 2019-11-01 LAB — FERRITIN: Ferritin: 154 ng/mL (ref 11–307)

## 2019-11-01 LAB — C-REACTIVE PROTEIN: CRP: 3.2 mg/dL — ABNORMAL HIGH (ref <1.0)

## 2019-11-01 MED ORDER — DIVALPROEX SODIUM 125 MG PO CSDR
250.0000 mg | DELAYED_RELEASE_CAPSULE | Freq: Three times a day (TID) | ORAL | Status: DC
  Administered 2019-11-01 – 2019-11-03 (×8): 250 mg via ORAL
  Filled 2019-11-01 (×9): qty 2

## 2019-11-01 MED ORDER — POTASSIUM CHLORIDE 10 MEQ/100ML IV SOLN: 10.0000 meq | INTRAVENOUS | Status: DC

## 2019-11-01 MED ORDER — AZITHROMYCIN 250 MG PO TABS
500.0000 mg | ORAL_TABLET | Freq: Every day | ORAL | Status: DC
  Administered 2019-11-01: 500 mg via ORAL
  Filled 2019-11-01 (×2): qty 2

## 2019-11-01 MED ORDER — METOPROLOL TARTRATE 5 MG/5ML IV SOLN
INTRAVENOUS | Status: AC
  Filled 2019-11-01: qty 5

## 2019-11-01 MED ORDER — PANTOPRAZOLE SODIUM 40 MG PO TBEC
40.0000 mg | DELAYED_RELEASE_TABLET | Freq: Every day | ORAL | Status: DC
  Administered 2019-11-01 – 2019-11-02 (×2): 40 mg via ORAL
  Filled 2019-11-01 (×2): qty 1

## 2019-11-01 MED ORDER — CLONAZEPAM 1 MG PO TABS
1.0000 mg | ORAL_TABLET | Freq: Three times a day (TID) | ORAL | Status: DC
  Administered 2019-11-01 – 2019-11-03 (×8): 1 mg via ORAL
  Filled 2019-11-01 (×8): qty 1

## 2019-11-01 MED ORDER — RISPERIDONE 0.25 MG PO TABS
0.2500 mg | ORAL_TABLET | Freq: Two times a day (BID) | ORAL | Status: DC
  Administered 2019-11-01 – 2019-11-03 (×5): 0.25 mg via ORAL
  Filled 2019-11-01 (×5): qty 1

## 2019-11-01 MED ORDER — CARBIDOPA-LEVODOPA 25-100 MG PO TABS
1.0000 | ORAL_TABLET | Freq: Three times a day (TID) | ORAL | Status: DC
  Administered 2019-11-01 – 2019-11-03 (×8): 1 via ORAL
  Filled 2019-11-01 (×8): qty 1

## 2019-11-01 MED ORDER — METOPROLOL TARTRATE 5 MG/5ML IV SOLN
5.0000 mg | Freq: Once | INTRAVENOUS | Status: AC
  Administered 2019-11-01: 5 mg via INTRAVENOUS

## 2019-11-01 MED ORDER — APIXABAN 2.5 MG PO TABS
2.5000 mg | ORAL_TABLET | Freq: Two times a day (BID) | ORAL | Status: DC
  Administered 2019-11-01 – 2019-11-03 (×4): 2.5 mg via ORAL
  Filled 2019-11-01 (×4): qty 1

## 2019-11-01 MED ORDER — DULOXETINE HCL 30 MG PO CPEP
30.0000 mg | ORAL_CAPSULE | Freq: Every day | ORAL | Status: DC
  Administered 2019-11-01 – 2019-11-03 (×3): 30 mg via ORAL
  Filled 2019-11-01 (×3): qty 1

## 2019-11-01 MED ORDER — MIDODRINE HCL 2.5 MG PO TABS
2.5000 mg | ORAL_TABLET | Freq: Three times a day (TID) | ORAL | Status: DC
  Administered 2019-11-01 – 2019-11-03 (×8): 2.5 mg via ORAL
  Filled 2019-11-01 (×9): qty 1

## 2019-11-01 MED ORDER — POTASSIUM CHLORIDE CRYS ER 20 MEQ PO TBCR
40.0000 meq | EXTENDED_RELEASE_TABLET | Freq: Two times a day (BID) | ORAL | Status: AC
  Administered 2019-11-01 (×2): 40 meq via ORAL
  Filled 2019-11-01 (×2): qty 2

## 2019-11-01 NOTE — Progress Notes (Signed)
   11/01/19 0405  Vitals  BP 105/74  MAP (mmHg) 84  BP Location Right Arm  BP Method Automatic  Patient Position (if appropriate) Lying  Pulse Rate (!) 103  Pulse Rate Source Monitor  Resp (!) 21  Oxygen Therapy  SpO2 99 %  O2 Device Nasal Cannula  O2 Flow Rate (L/min) 2 L/min  MEWS Score  MEWS RR 1  MEWS Pulse 1  MEWS Systolic 0  MEWS LOC 0  MEWS Temp 0  MEWS Score 2  MEWS Score Color Yellow  MEWS Assessment  Is this an acute change? Yes  MEWS guidelines implemented *See Row Information* Yellow  Provider Notification  Provider Name/Title M.Khan  Date Provider Notified 11/01/19  Time Provider Notified [phone]  Notification Type Page  Notification Reason Change in status  on call provider notified, new orders made, order carried out. Will continue to monitor

## 2019-11-01 NOTE — Progress Notes (Signed)
ANTICOAGULATION CONSULT NOTE - Initial Consult  Pharmacy Consult for Eliquis Indication: VTE prophylaxis  Allergies  Allergen Reactions  . Sinemet [Carbidopa W-Levodopa] Nausea And Vomiting    Able to tolerate with Zofran  . Sulfa Antibiotics Other (See Comments)    Flushing and hot    Patient Measurements: Height: 5\' 7"  (170.2 cm) Weight: 130 lb (59 kg) IBW/kg (Calculated) : 61.6 Heparin Dosing Weight:   Vital Signs: BP: 127/67 (01/03 0553) Pulse Rate: 69 (01/03 0553)  Labs: Recent Labs    10/30/19 0253 10/31/19 0305 11/01/19 0535  HGB 8.6* 8.1* 8.5*  HCT 29.0* 27.1* 27.4*  PLT 157 177 194  CREATININE 0.76 0.68 0.62    Estimated Creatinine Clearance: 58.3 mL/min (by C-G formula based on SCr of 0.62 mg/dL).   Medical History: Past Medical History:  Diagnosis Date  . Anal stenosis   . Anxiety   . Decubitus ulcer   . 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 Depression   . Fatty liver   . GERD (gastroesophageal reflux disease)   . Hiatal hernia   . History of anal fissures   . History of chronic constipation   . History of kidney stones    1973  . Hyperlipidemia   . Left ureteral calculus   . Parkinson's disease (HCC)   . Wears glasses     Medications:  Medications Prior to Admission  Medication Sig Dispense Refill Last Dose  . Amino Acids-Protein Hydrolys (FEEDING SUPPLEMENT, PRO-STAT SUGAR FREE 64,) LIQD Take 30 mLs by mouth 2 (two) times daily.    10/28/2019 at 0826  . apixaban (ELIQUIS) 2.5 MG TABS tablet Take 2.5 mg by mouth every 12 (twelve) hours.   10/28/2019 at 0826  . carbidopa-levodopa (SINEMET IR) 25-100 MG tablet Take 1 tablet by mouth 3 (three) times daily. We will increase this slowly... For 1 week (take 1 tablet in the morning, 1/2 tablet at lunch, 1/2 tablet at dinner) For 1 week (take 1 tablet in the morning, 1 tablet at lunch, 1/2 tablet at dinner) For 1 week (take 1 tablet in morning, 1 tablet at lunch, 1 tablet at dinner)  135 tablet 1 10/28/2019 at 0826  . clonazePAM (KLONOPIN) 1 MG tablet Take 1 mg by mouth 3 (three) times daily.   10/28/2019 at 0553  . divalproex (DEPAKOTE SPRINKLE) 125 MG capsule Take 250 mg by mouth 3 (three) times daily.   10/28/2019 at 0826  . DULoxetine (CYMBALTA) 30 MG capsule Take 30 mg by mouth daily.   10/28/2019 at 0826  . ferrous sulfate 325 (65 FE) MG tablet Take 325 mg by mouth every 12 (twelve) hours.    10/28/2019 at 1043  . lansoprazole (PREVACID) 30 MG capsule Take 30 mg by mouth every other day.    10/28/2019 at 0826  . midodrine (PROAMATINE) 2.5 MG tablet Take 1 tablet (2.5 mg total) by mouth 3 (three) times daily with meals. 90 tablet 0 10/28/2019 at 0826  . ondansetron (ZOFRAN) 4 MG tablet Take 1 tablet (4 mg total) by mouth every 8 (eight) hours as needed for nausea or vomiting. 20 tablet 3 06/21/2019  . risperiDONE (RISPERDAL) 0.5 MG tablet Take 0.25 mg by mouth 2 (two) times daily.   10/28/2019 at 0826  . silver nitrate applicators 75-25 % applicator Apply 10 Sticks (10 application total) topically once as needed for bleeding (bleeding from sacral wound). 100 each 0 04/18/2019  . traMADol-acetaminophen (ULTRACET) 37.5-325 MG tablet Take 1 tablet by mouth every 8 (  eight) hours as needed for moderate pain.    10/28/2019 at 0826  . ALPRAZolam (XANAX) 0.5 MG tablet Take 1 tablet (0.5 mg total) by mouth 3 (three) times daily as needed for anxiety. (Patient not taking: Reported on 10/28/2019) 3 tablet 0 Not Taking at Unknown time  . apixaban (ELIQUIS) 5 MG TABS tablet Take 1 tablet (5 mg total) by mouth 2 (two) times daily. (Patient not taking: Reported on 10/28/2019) 60 tablet 0 Not Taking at Unknown time  . cefTRIAXone (ROCEPHIN) 1 g injection Inject 1 g into the muscle daily.   10/22/2019  . oxyCODONE-acetaminophen (PERCOCET/ROXICET) 5-325 MG tablet Take 1-2 tablets by mouth every 6 (six) hours as needed for severe pain. (Patient not taking: Reported on 10/28/2019) 4 tablet 0 Not  Taking at Unknown time    Assessment: 74 yo F on long term Eliquis for hx PE.  On Eliquis 2.5 mg po BID PTA for long term VTE prophylaxis.  She has been on lovenox bridge therapy while she was NPO.  Now she is able to take po medicines.  Hg 8.5 low but stable, PLTC WNL, no bleeding reported.    Plan:  DC LMWH 60 q12 - last dose this AM Resume PTA Eliquis 2.5 mg po BID tonight at 2200 Pharmacy to sign off  Eudelia Bunch, Pharm.D [phone] 11/01/2019 11:23 AM

## 2019-11-01 NOTE — Progress Notes (Signed)
Pharmacy: IV to PO  Azithromycin & protonix changed from IV to PO  Herby Abraham, Pharm.D [phone] 11/01/2019 10:46 AM

## 2019-11-01 NOTE — Progress Notes (Signed)
Central tele notified this RN that pt has new onset of afib with HR of 140s on monitor. EKG showed a-fib w/ RVR and prolonged QT. Pt unable to report symptom. Pt was alert and responded to question and states she has no chest pain when asked. On-call provider notified, see new order. RN carried out verbal order. Pt is currently on eliquis due to hx of DVT. Will continue to monitor cardiac status/

## 2019-11-01 NOTE — Progress Notes (Signed)
PROGRESS NOTE    Becky Juneauatricia P Ashline  ZOX:[id number] DOB: 29-Mar-1946 DOA: 10/28/2019 PCP: Richmond CampbellKaplan, Kristen W., PA-C    Brief Narrative:  74 y.o. 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 Pt has underlying dementia and cannot provide her own history, only yelling constantly when seen. Family at bedside who states pt recently completed course of abx for PNA and has hx of dysphagia and concerns of aspiration several weeks leading up to ED visit. Pt presents from facility which reportedly has case of COVID outbreak. Per EDP, pt noted to be markedly sob and EMS called. On arrival, pt noted to have O2 sats in the low-80's, improved with 2LNC.   ED Course: In the ED, had CXR with findings initially suggestive of chronic interstitial changes. Follow up CT chest with findings worrisome for viral PNA. Initial COVID testing was neg. Repeat covid test ordered and is pending. Pt was started on empiric azithro and rocephin. Inflammatory markers were ordered and remain pending. Pt has since been weaned to room air. Hospitalist consulted for consideration for admission.  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 Multifocal PNA with sepsis present on admit 1. Chest CT personally reviewed. Findings worrisome for viral PNA 2. Initial COVID testing is neg, however, pt resides at facility with reported recent COVID outbreak 3. Pt currently tachycardic and with tachypnea. Normal WBC 4. Pt reportedly with O2 sats in the low 80's in the field, initially improved to mid-90's on room air 5. Repeat COVID neg 6. Family at bedside. Reports recent hx of  aspiration over the past several weeks leading up to admission 7. Was continued on empiric azithromycin, rocephin, and flagyl, now on unasyn per below which would cover aspiration PNA 8. O2 requirements peaked to [facility]4LNC with evidence of multifocal PNA on CXR 9. Appreciate input by ID. Initial concern for possible COVID, however per ID, likely not covid and OK to transfer out of isolation 10. Seen by SLP with recs for dysphagia 2 diet with nectar thick liquids 2. GERD 1. Continue on PPI, transition to PO 2. Remains NPO at this time 3. Dementia with Parkinson's 1. Pt now clear to take PO 2. Have resumed home meds 3. More alert and conversant today. Very pleasant 4. Hx DVT on chronic anticoagulation 1. On eliquis prior to admit 2. Given concerns of aspiration, initially held xarelto and continued on therapeutic lovenox 3. Pt now clear for PO, transition back to home eliquis 5. Anxiety 1. Seems much improved 2. Pleasant this AM 6. Hx aspiration 1. Family had reported issues with aspiration over past several weeks prior to admission 2. SLP consulted per above, recommendation for dysphagia 2 with nectar thick liquids 7. End of Life 1. Discussed with family at bedside at time of admit 2. Family is well aware of pt's wishes for DNR/DNI  DVT prophylaxis: Lovenox, transitioned to eliquis Code Status: DNR Family Communication: Pt in room, family in room Disposition Plan: Uncertain at this time  Consultants:   ID  Procedures:     Antimicrobials: Anti-infectives (From admission, onward)   Start     Dose/Rate Route Frequency Ordered Stop   11/01/19 1800  azithromycin (ZITHROMAX) tablet 500 mg     500 mg Oral Daily-1800 11/01/19 1040     10/30/19 1100  ampicillin-sulbactam (UNASYN) 1.5 g in sodium chloride 0.9 % 100 mL IVPB     1.5 g 200 mL/hr over 30 Minutes Intravenous Every 6 hours 10/30/19 1036     10/30/19 1000  remdesivir 100 mg in sodium chloride 0.9 % 100 mL IVPB  Status:   Discontinued     100 mg 200 mL/hr over 30 Minutes Intravenous Daily 10/29/19 1204 10/29/19 1513   10/29/19 1900  azithromycin (ZITHROMAX) 500 mg in sodium chloride 0.9 % 250 mL IVPB  Status:  Discontinued     500 mg 250 mL/hr over 60 Minutes Intravenous Every 24 hours 10/29/19 1845 11/01/19 1040   10/29/19 1600  cefTRIAXone (ROCEPHIN) 1 g in sodium chloride 0.9 % 100 mL IVPB  Status:  Discontinued     1 g 200 mL/hr over 30 Minutes Intravenous Every 24 hours 10/29/19 0944 10/30/19 1036   10/29/19 1400  remdesivir 200 mg in sodium chloride 0.9% 250 mL IVPB  Status:  Discontinued     200 mg 580 mL/hr over 30 Minutes Intravenous Once 10/29/19 1204 10/29/19 1513   10/28/19 2000  metroNIDAZOLE (FLAGYL) IVPB 500 mg  Status:  Discontinued     500 mg 100 mL/hr over 60 Minutes Intravenous Every 8 hours 10/28/19 1714 10/28/19 1907   10/28/19 2000  metroNIDAZOLE (FLAGYL) IVPB 500 mg  Status:  Discontinued     500 mg 100 mL/hr over 60 Minutes Intravenous Every 8 hours 10/28/19 1907 10/30/19 1036   10/28/19 1600  cefTRIAXone (ROCEPHIN) 1 g in sodium chloride 0.9 % 100 mL IVPB     1 g 200 mL/hr over 30 Minutes Intravenous  Once 10/28/19 1553 10/28/19 1728   10/28/19 1600  azithromycin (ZITHROMAX) 500 mg in sodium chloride 0.9 % 250 mL IVPB     500 mg 250 mL/hr over 60 Minutes Intravenous  Once 10/28/19 1553 10/28/19 1728      Subjective: Pleasant and smiling this AM. No complaints. Reports feeling better  Objective: Vitals:   10/31/19 2104 11/01/19 0405 11/01/19 0553 11/01/19 1343  BP: 109/73 105/74 127/67 106/62  Pulse: 89 (!) 103 69 84  Resp: 18 (!) 21 (!) 25 18  Temp: 98.7 F (37.1 C)   (!) 97.3 F (36.3 C)  TempSrc:    Oral  SpO2: 97% 99% 100% 100%  Weight:      Height:        Intake/Output Summary (Last 24 hours) at 11/01/2019 1613 Last data filed at 11/01/2019 1558 Gross per 24 hour  Intake 3466.02 ml  Output 125 ml  Net 3341.02 ml   Filed Weights   10/28/19 1129  Weight: 59  kg    Examination: General exam: Conversant, in no acute distress Respiratory system: normal chest rise, clear, no audible wheezing Cardiovascular system: regular rhythm, s1-s2 Gastrointestinal system: Nondistended, nontender, pos BS Central nervous system: No seizures, no tremors Extremities: No cyanosis, no joint deformities Skin: No rashes, no pallor Psychiatry: Affect normal // no auditory hallucinations   Data Reviewed: I have personally reviewed following labs and imaging studies  CBC: Recent Labs  Lab 10/28/19 1152 10/29/19 0541 10/30/19 0253 10/31/19 0305 11/01/19 0535  WBC 9.7 11.6* 5.9 5.4 5.7  NEUTROABS 7.8*  --   --   --   --   HGB 11.2* 10.0* 8.6* 8.1* 8.5*  HCT 35.1* 31.6* 29.0* 27.1* 27.4*  MCV 102.6*  101.9* 104.7* 105.4* 103.0*  PLT 203 191 157 177 751   Basic Metabolic Panel: Recent Labs  Lab 10/28/19 1152 10/29/19 0541 10/30/19 0253 10/31/19 0305 11/01/19 0535  NA 141 144 149* 145 143  K 4.6 4.3 4.1 3.7 2.5*  CL 100 103 111 110 112*  CO2 31 29 30 23 24   GLUCOSE 143* 136* 101* 82 115*  BUN 30* 27* 27* 27* 23  CREATININE 0.77 0.81 0.76 0.68 0.62  CALCIUM 8.9 8.9 8.6* 8.2* 7.8*  MG  --   --   --   --  2.0   GFR: Estimated Creatinine Clearance: 58.3 mL/min (by C-G formula based on SCr of 0.62 mg/dL). Liver Function Tests: Recent Labs  Lab 10/28/19 1152 10/29/19 0541 10/30/19 0253 10/31/19 0305 11/01/19 0535  AST 19 25 24 25 25   ALT 7 17 17 19 20   ALKPHOS 123 137* 124 102 98  BILITOT 0.6 0.4 0.3 0.9 0.3  PROT 7.4 7.2 6.6 5.7* 5.8*  ALBUMIN 3.2* 2.7* 2.6* 2.4* 2.3*   No results for input(s): LIPASE, AMYLASE in the last 168 hours. No results for input(s): AMMONIA in the last 168 hours. Coagulation Profile: No results for input(s): INR, PROTIME in the last 168 hours. Cardiac Enzymes: No results for input(s): CKTOTAL, CKMB, CKMBINDEX, TROPONINI in the last 168 hours. BNP (last 3 results) No results for input(s): PROBNP in the last 8760  hours. HbA1C: No results for input(s): HGBA1C in the last 72 hours. CBG: No results for input(s): GLUCAP in the last 168 hours. Lipid Profile: No results for input(s): CHOL, HDL, LDLCALC, TRIG, CHOLHDL, LDLDIRECT in the last 72 hours. Thyroid Function Tests: No results for input(s): TSH, T4TOTAL, FREET4, T3FREE, THYROIDAB in the last 72 hours. Anemia Panel: Recent Labs    10/31/19 0305 11/01/19 0535  FERRITIN 167 154   Sepsis Labs: Recent Labs  Lab 10/28/19 1152 10/28/19 1732 10/29/19 1738 10/30/19 0253 10/31/19 0305  PROCALCITON  --  <0.10 0.23 0.20 0.13  LATICACIDVEN 1.8  --   --   --   --     Recent Results (from the past 240 hour(s))  Blood culture (routine x 2)     Status: None (Preliminary result)   Collection Time: 10/28/19 11:52 AM   Specimen: BLOOD LEFT HAND  Result Value Ref Range Status   Specimen Description   Final    BLOOD LEFT HAND Performed at [facility], [place] [address], [place], [place] [address]    Special Requests   Final    BOTTLES DRAWN AEROBIC AND ANAEROBIC Blood Culture results may not be optimal due to an inadequate volume of blood received in culture bottles Performed at [employer] [address] [place], [place] [address]    Culture   Final    NO GROWTH 4 DAYS Performed at [facility], [place] [address], [place], [place] [address]    Report Status PENDING  Incomplete  Blood culture (routine x 2)     Status: None (Preliminary result)   Collection Time: 10/28/19 11:52 AM   Specimen: BLOOD RIGHT FOREARM  Result Value Ref Range Status   Specimen Description   Final    BLOOD RIGHT FOREARM Performed at [facility] [address] [place], [place] [address]    Special Requests   Final    BOTTLES DRAWN AEROBIC AND ANAEROBIC Blood Culture adequate volume Performed at [facility] [address], [place],  [address]  Culture   Final    NO GROWTH  4 DAYS Performed at [facility], [address] [place], [place] [address]    Report Status PENDING  Incomplete  Urine culture     Status: Abnormal   Collection Time: 10/28/19 11:52 AM   Specimen: Urine, Random  Result Value Ref Range Status   Specimen Description   Final    URINE, RANDOM Performed at [facility], [address], [place], [place] [address]    Special Requests   Final    NONE Performed at [facility], [address], [place], [place] [address]    Culture 50,000 COLONIES/mL ENTEROCOCCUS FAECALIS (A)  Final   Report Status 10/30/2019 FINAL  Final   Organism ID, Bacteria ENTEROCOCCUS FAECALIS (A)  Final      Susceptibility   Enterococcus faecalis - MIC*    AMPICILLIN <=2 SENSITIVE Sensitive     NITROFURANTOIN <=16 SENSITIVE Sensitive     VANCOMYCIN >=32 RESISTANT Resistant     LINEZOLID 1 SENSITIVE Sensitive     * 50,000 COLONIES/mL ENTEROCOCCUS FAECALIS  SARS CORONAVIRUS 2 (TAT 6-24 HRS) Nasopharyngeal Nasopharyngeal Swab     Status: None   Collection Time: 10/28/19  1:19 PM   Specimen: Nasopharyngeal Swab  Result Value Ref Range Status   SARS Coronavirus 2 NEGATIVE NEGATIVE Final    Comment: (NOTE) SARS-CoV-2 target nucleic acids are NOT DETECTED. The SARS-CoV-2 RNA is generally detectable in upper and lower respiratory specimens during the acute phase of infection. Negative results do not preclude SARS-CoV-2 infection, do not rule out co-infections with other pathogens, and should not be used as the sole basis for treatment or other patient management decisions. Negative results must be combined with clinical observations, patient history, and epidemiological information. The expected result is Negative. Fact Sheet for Patients: HairSlick.no Fact Sheet for Healthcare Providers: quierodirigir.com This test is not yet approved or cleared by the [place]  FDA and  has been authorized for detection and/or diagnosis of SARS-CoV-2 by FDA under an Emergency Use Authorization (EUA). This EUA will remain  in effect (meaning this test can be used) for the duration of the COVID-19 declaration under Section 56 4(b)(1) of the Act, 21 U.S.C. section 360bbb-3(b)(1), unless the authorization is terminated or revoked sooner. Performed at [facility], [address], [place], [place] [address]   Respiratory Panel by RT PCR (Flu A&B, Covid) - Nasopharyngeal Swab     Status: None   Collection Time: 10/29/19 12:00 PM   Specimen: Nasopharyngeal Swab  Result Value Ref Range Status   SARS Coronavirus 2 by RT PCR NEGATIVE NEGATIVE Final    Comment: (NOTE) SARS-CoV-2 target nucleic acids are NOT DETECTED. The SARS-CoV-2 RNA is generally detectable in upper respiratoy specimens during the acute phase of infection. The lowest concentration of SARS-CoV-2 viral copies this assay can detect is 131 copies/mL. A negative result does not preclude SARS-Cov-2 infection and should not be used as the sole basis for treatment or other patient management decisions. A negative result may occur with  improper specimen collection/handling, submission of specimen other than nasopharyngeal swab, presence of viral mutation(s) within the areas targeted by this assay, and inadequate number of viral copies (<131 copies/mL). A negative result must be combined with clinical observations, patient history, and epidemiological information. The expected result is Negative. Fact Sheet for Patients:  https://www.moore.com/ Fact Sheet for Healthcare Providers:  https://www.young.biz/ This test is not yet ap proved or cleared  by the [place] FDA and  has been authorized for detection and/or diagnosis of SARS-CoV-2 by FDA under an Emergency Use Authorization (EUA). This EUA will remain  in effect (meaning this test can be used) for the  duration of the COVID-19 declaration under Section 564(b)(1) of the Act, 21 U.S.C. section 360bbb-3(b)(1), unless the authorization is terminated or revoked sooner.    Influenza A by PCR NEGATIVE NEGATIVE Final   Influenza B by PCR NEGATIVE NEGATIVE Final    Comment: (NOTE) The Xpert Xpress SARS-CoV-2/FLU/RSV assay is intended as an aid in  the diagnosis of influenza from Nasopharyngeal swab specimens and  should not be used as a sole basis for treatment. Nasal washings and  aspirates are unacceptable for Xpert Xpress SARS-CoV-2/FLU/RSV  testing. Fact Sheet for Patients: https://www.moore.com/https://www.fda.gov/media/142436/download Fact Sheet for Healthcare Providers: https://www.young.biz/https://www.fda.gov/media/142435/download This test is not yet approved or cleared by the [place] FDA and  has been authorized for detection and/or diagnosis of SARS-CoV-2 by  FDA under an Emergency Use Authorization (EUA). This EUA will remain  in effect (meaning this test can be used) for the duration of the  Covid-19 declaration under Section 564(b)(1) of the Act, 21  U.S.C. section 360bbb-3(b)(1), unless the authorization is  terminated or revoked. Performed at [facility], [address] [place], [place] [address]   Respiratory Panel by PCR     Status: None   Collection Time: 10/29/19  6:30 PM   Specimen: Nasopharyngeal Swab; Respiratory  Result Value Ref Range Status   Adenovirus NOT DETECTED NOT DETECTED Final   Coronavirus 229E NOT DETECTED NOT DETECTED Final    Comment: (NOTE) The Coronavirus on the Respiratory Panel, DOES NOT test for the novel  Coronavirus (2019 nCoV)    Coronavirus HKU1 NOT DETECTED NOT DETECTED Final   Coronavirus NL63 NOT DETECTED NOT DETECTED Final   Coronavirus OC43 NOT DETECTED NOT DETECTED Final   Metapneumovirus NOT DETECTED NOT DETECTED Final   Rhinovirus / Enterovirus NOT DETECTED NOT DETECTED Final   Influenza A NOT DETECTED NOT DETECTED Final   Influenza B NOT  DETECTED NOT DETECTED Final   Parainfluenza Virus 1 NOT DETECTED NOT DETECTED Final   Parainfluenza Virus 2 NOT DETECTED NOT DETECTED Final   Parainfluenza Virus 3 NOT DETECTED NOT DETECTED Final   Parainfluenza Virus 4 NOT DETECTED NOT DETECTED Final   Respiratory Syncytial Virus NOT DETECTED NOT DETECTED Final   Bordetella pertussis NOT DETECTED NOT DETECTED Final   Chlamydophila pneumoniae NOT DETECTED NOT DETECTED Final   Mycoplasma pneumoniae NOT DETECTED NOT DETECTED Final    Comment: Performed at [facility], [address] [place], [place] [address]     Radiology Studies: No results found.  Scheduled Meds: . apixaban  2.5 mg Oral BID  . vitamin C  1,000 mg Oral Daily  . azithromycin  500 mg Oral q1800  . carbidopa-levodopa  1 tablet Oral TID WC  . clonazePAM  1 mg Oral TID  . dexamethasone (DECADRON) injection  6 mg Intravenous Q24H  . divalproex  250 mg Oral TID  . DULoxetine  30 mg Oral Daily  . midodrine  2.5 mg Oral TID WC  . pantoprazole  40 mg Oral QHS  . potassium chloride  40 mEq Oral BID  . risperiDONE  0.25 mg Oral BID  . zinc sulfate  220 mg Oral Daily   Continuous Infusions: . sodium chloride 50 mL/hr at 11/01/19 1400  . ampicillin-sulbactam (UNASYN) IV 1.5 g (11/01/19  1123)     LOS: 4 days   Rickey Barbara, MD [facility] Pager On Amion  If 7PM-7AM, please contact night-coverage 11/01/2019, 4:13 PM

## 2019-11-01 NOTE — Progress Notes (Signed)
CRITICAL VALUE ALERT  Critical Value:  Potassium 2.7  Date & Time Notied:  11/01/19 @0707   Provider Notified: S.Chiu  Orders Received/Actions taken: waiting orders

## 2019-11-02 LAB — BASIC METABOLIC PANEL
Anion gap: 10 (ref 5–15)
BUN: 21 mg/dL (ref 8–23)
CO2: 20 mmol/L — ABNORMAL LOW (ref 22–32)
Calcium: 8.1 mg/dL — ABNORMAL LOW (ref 8.9–10.3)
Chloride: 111 mmol/L (ref 98–111)
Creatinine, Ser: 0.66 mg/dL (ref 0.44–1.00)
GFR calc Af Amer: >60 mL/min (ref >60)
GFR calc non Af Amer: >60 mL/min (ref >60)
Glucose, Bld: 86 mg/dL (ref 70–99)
Potassium: 4.1 mmol/L (ref 3.5–5.1)
Sodium: 141 mmol/L (ref 135–145)

## 2019-11-02 LAB — CULTURE, BLOOD (ROUTINE X 2)
Culture: NO GROWTH
Culture: NO GROWTH
Special Requests: ADEQUATE

## 2019-11-02 LAB — SARS CORONAVIRUS 2 (TAT 6-24 HRS): SARS Coronavirus 2: NEGATIVE

## 2019-11-02 MED ORDER — SODIUM CHLORIDE 0.9 % IV SOLN
INTRAVENOUS | Status: DC | PRN
  Administered 2019-11-02: 250 mL via INTRAVENOUS

## 2019-11-02 MED ORDER — BENZONATATE 100 MG PO CAPS
100.0000 mg | ORAL_CAPSULE | Freq: Two times a day (BID) | ORAL | Status: DC | PRN
  Administered 2019-11-02: 100 mg via ORAL
  Filled 2019-11-02: qty 1

## 2019-11-02 NOTE — Care Management Important Message (Signed)
Important Message  Patient Details IM Letter given to Sharol Roussel RN Case Manager to present to the Patient Name: Becky Figueroa MRN: [id number] Date of Birth: 01/20/1946   Medicare Important Message Given:  Yes     [name] 11/02/2019, 1:19 PM

## 2019-11-02 NOTE — Progress Notes (Signed)
  Speech Language Pathology Treatment: Dysphagia  Patient Details Name: Becky Figueroa MRN: [id number] DOB: December 25, 1945 Today's Date: 11/02/2019 Time: 7035-0093 SLP Time Calculation (min) (ACUTE ONLY): 25 min  Assessment / Plan / Recommendation Clinical Impression  Pt seen at bedside for follow up after BSE completed 10/31/19. Nursing reports extended oral prep and pocketing of Dys 2 textures, as well as significantly ill-fitting dentures. RN reported son present and required education regarding need for thickened liquids. He brought in denture adhesive.   Pt was awake and resting in bed upon arrival of SLP. Dentures noted to still be ill-fitting, so SLP applied adhesive to upper plate with good results. Pt accepted trials of puree, nectar thick, and honey thick liquids. Nectar thick liquids via cup sip and spoon resulted in immediate cough response. Other consistencies were tolerated without overt s/s aspiration. SLP provided education to pt and son regarding current diet, recommended downgrade, and requested permission to complete objective swallow evaluation. Son agreed to both diet and MBS.   Will downgrade diet to puree and honey thick liquids, and proceed with MBS next date. RN and MD informed. SLP will schedule with radiology for Tuesday 11/03/19.     HPI HPI: 74 y.o. 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  Family states pt recently completed course of abx for PNA and has hx of dysphagia and concerns of aspiration several weeks leading up to ED visit.  Pt seen multiple times by ST in the past year with the most recent recommendations for Dysphagia 3 (soft) solids and thin liquids.  CXR on 10/29/19 remarkable for "Patchy opacities predominantly throughout the left mid to lower lung and  to a lesser extent in the right upper lobe suspicious for multifocal infection."        SLP Plan  MBS;Continue with current plan of care;New goals to be determined pending instrumental study       Recommendations  Diet recommendations: Dysphagia 1 (puree);Honey-thick liquid Liquids provided via: Cup Medication Administration: Crushed with puree Supervision: Full supervision/cueing for compensatory strategies;Trained caregiver to feed patient Compensations: Minimize environmental distractions;Slow rate;Small sips/bites Postural Changes and/or Swallow Maneuvers: Seated upright 90 degrees;Upright 30-60 min after meal                Oral Care Recommendations: Oral care BID;Staff/trained caregiver to provide oral care Follow up Recommendations: Skilled Nursing facility;24 hour supervision/assistance SLP Visit Diagnosis: Dysphagia, unspecified (R13.10) Plan: MBS;Continue with current plan of care;New goals to be determined pending instrumental study       GO           Harlow Asa, [facility], CCC-SLP Speech Language Pathologist Office: [phone] Pager: [phone]  Leigh Aurora 11/02/2019, 5:10 PM

## 2019-11-02 NOTE — Consult Note (Signed)
WOC Nurse Consult Note: Reason for Consult: sacral pressure injury Wound type: Stage 3 Pressure injury Pressure Injury POA: Yes Measurement:6.5cm x 9cm x 0.1cm  Wound bed: pale, non granular, chronic in appearance  Drainage (amount, consistency, odor) unable to assess, dressing contaminated with stool Periwound:intact; epibole of the wound edges Dressing procedure/placement/frequency: Continue silicone foam for protection, with current nutritional state doubt healing is our goal. Instead preservation at current state.    Re consult if needed, will not follow at this time. Thanks  Zohar Laing [employer], RN,CWOCN, CNS, CWON-AP [phone])

## 2019-11-02 NOTE — TOC Initial Note (Signed)
Transition of Care [facility]) - Initial/Assessment Note    Patient Details  Name: Becky Figueroa MRN: [id number] Date of Birth: 28-Sep-1946  Transition of Care [facility]) CM/SW Contact:    Armanda Heritage, RN Phone Number: 11/02/2019, 12:35 PM  Clinical Narrative:                 CM confirmed patient is from [facility] long term resident. Facility rep states they will need an updated Covid test (has to be negative within 72 hours of dc) and dc summary for patient to return to facility. Anticipate bed availability tomorrow 11/03/19.   Expected Discharge Plan: Skilled Nursing Facility Barriers to Discharge: Continued Medical Work up   Patient Goals and CMS Choice Patient states their goals for this hospitalization and ongoing recovery are:: to go back to [facility]      Expected Discharge Plan and Services Expected Discharge Plan: Skilled Nursing Facility   Discharge Planning Services: CM Consult Post Acute Care Choice: Resumption of Svcs/PTA Provider Living arrangements for the past 2 months: Skilled Nursing Facility                 DME Arranged: N/A DME Agency: NA       HH Arranged: NA HH Agency: NA        Prior Living Arrangements/Services Living arrangements for the past 2 months: Skilled Nursing Facility Lives with:: Facility Resident Patient language and need for interpreter reviewed:: Yes Do you feel safe going back to the place where you live?: Yes      Need for Family Participation in Patient Care: Yes (Comment) Care giver support system in place?: Yes (comment)   Criminal Activity/Legal Involvement Pertinent to Current Situation/Hospitalization: No - Comment as needed  Activities of Daily Living Home Assistive Devices/Equipment: Blood pressure cuff, Grab bars around toilet, Grab bars in shower, Hand-held shower hose, Hospital bed, [employer], Nebulizer, Oxygen, Scales, Wheelchair([facility] has necessary equipment for their residents) ADL Screening  (condition at time of admission) Patient's cognitive ability adequate to safely complete daily activities?: No Is the patient deaf or have difficulty hearing?: Yes(slight hoh) Does the patient have difficulty seeing, even when wearing glasses/contacts?: No Does the patient have difficulty concentrating, remembering, or making decisions?: Yes Patient able to express need for assistance with ADLs?: No Does the patient have difficulty dressing or bathing?: Yes Independently performs ADLs?: No Communication: Independent Dressing (OT): Dependent Is this a change from baseline?: Pre-admission baseline Grooming: Dependent Is this a change from baseline?: Pre-admission baseline Feeding: Dependent Is this a change from baseline?: Pre-admission baseline Bathing: Dependent Is this a change from baseline?: Pre-admission baseline Toileting: Dependent Is this a change from baseline?: Pre-admission baseline In/Out Bed: Dependent Is this a change from baseline?: Pre-admission baseline Walks in Home: Dependent Is this a change from baseline?: Pre-admission baseline Does the patient have difficulty walking or climbing stairs?: Yes(sedcondary to weakness) Weakness of Legs: Both Weakness of Arms/Hands: Both  Permission Sought/Granted                  Emotional Assessment           Psych Involvement: No (comment)  Admission diagnosis:  Tachypnea [R06.82] Tachycardia [R00.0] Hypoxia [R09.02] Viral pneumonia [J12.9] Patient Active Problem List   Diagnosis Date Noted  . Viral pneumonia 10/28/2019  . DVT (deep venous thrombosis) (HCC) 10/28/2019  . Coagulase negative Staphylococcus bacteremia   . Malnutrition of moderate degree 12/04/2018  . Constipation   . Osteomyelitis of sacroiliac region (  [place])   . AKI (acute kidney injury) ([place])   . Sepsis ([place]) 11/26/2018  . Osteomyelitis of sacrum ([employer]) 11/26/2018  . Decubitus ulcer with gangrene, stage 4 ([place])   . Toxic encephalopathy  11/04/2018  . Palliative care by specialist   . Goals of care, counseling/discussion   . Pressure injury of skin 11/03/2018  . Acute metabolic encephalopathy 87/68/1157  . Dehydration 11/02/2018  . Protein calorie malnutrition ([place]) 11/02/2018  . Dementia in Parkinson's disease ([place]) 09/18/2018  . Parkinson's disease ([place]) 04/21/2018  . Nausea 01/20/2018  . Diarrhea 11/26/2013  . Fecal incontinence 11/26/2013  . 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 Anal fissure 03/09/2013  . Unspecified constipation 03/09/2013  . ANXIETY 08/01/2010  . GERD 08/01/2010  . DYSPEPSIA 08/01/2010  . IRRITABLE BOWEL SYNDROME 08/01/2010   PCP:  Aletha Halim., PA-C Pharmacy:  No Pharmacies Listed    Social Determinants of Health (SDOH) Interventions    Readmission Risk Interventions No flowsheet data found.

## 2019-11-02 NOTE — Progress Notes (Signed)
Rickey Barbara, MD paged regarding the pt's Son's request for cough medicine.

## 2019-11-02 NOTE — Progress Notes (Signed)
PROGRESS NOTE    Becky Figueroa  IEP:[id number] DOB: September 03, 1946 DOA: 10/28/2019 PCP: Aletha Halim., PA-C    Brief Narrative:  74 y.o. 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 Pt has underlying dementia and cannot provide her own history, only yelling constantly when seen. Family at bedside who states pt recently completed course of abx for PNA and has hx of dysphagia and concerns of aspiration several weeks leading up to ED visit. Pt presents from facility which reportedly has case of COVID outbreak. Per EDP, pt noted to be markedly sob and EMS called. On arrival, pt noted to have O2 sats in the low-80's, improved with 2LNC.   ED Course: In the ED, had CXR with findings initially suggestive of chronic interstitial changes. Follow up CT chest with findings worrisome for viral PNA. Initial COVID testing was neg. Repeat covid test ordered and is pending. Pt was started on empiric azithro and rocephin. Inflammatory markers were ordered and remain pending. Pt has since been weaned to room air. Hospitalist consulted for consideration for admission.  Assessment & Plan:   Principal Problem:   Viral pneumonia Active Problems:   GERD   Dementia in Parkinson's disease ([place])   Parkinson's disease ([place])   DVT (deep venous thrombosis) ([place])  1. Multifocal PNA with sepsis present on admit 1. Chest CT personally reviewed. Findings worrisome for viral PNA 2. Initial COVID testing is neg, however, pt resides at facility with reported recent [place] outbreak 3. Pt currently tachycardic and with tachypnea. Normal WBC 4. Pt reportedly with O2 sats in the low 80's in the field, initially improved to mid-90's on room air 5. Family at bedside. Reports recent hx of aspiration over the past  several weeks leading up to admission 6. Was continued on empiric azithromycin, rocephin, and flagyl, now on unasyn per below which would cover aspiration PNA 7. O2 requirements peaked to [facility] with evidence of multifocal PNA on CXR 8. Appreciate input by ID. Initial concern for possible COVID, however per ID, likely not covid and OK to transfer out of isolation 9. Seen by SLP with recs for dysphagia 2 diet with nectar thick liquids 10. O2 requirements improved to [facility] with 100% O2 sats 2. GERD 1. Continue on PPI, transition to PO 2. Advanced diet to dysphagia 2 with nectar thick liquids 3. Dementia with Parkinson's 1. Pt now clear to take PO 2. Have resumed home meds 3. Pt is alert and conversant today. Remains very pleasant 4. Hx DVT on chronic anticoagulation 1. On eliquis prior to admit 2. Given concerns of aspiration, initially held xarelto and continued on therapeutic lovenox 3. Pt now clear for PO, now on home eliquis 5. Anxiety 1. Seems much improved 2. Pleasant this AM 6. Hx aspiration 1. Family had reported issues with aspiration over past several weeks prior to admission 2. SLP consulted per above, recommendation for dysphagia 2 with nectar thick liquids 7. End of Life 1. Discussed with family at bedside at time of admit 2. Family is well aware of pt's wishes for DNR/DNI 8. Stage 3 sacral decub ulcer, poa 1. Seen by WOC  DVT prophylaxis: Lovenox, transitioned to eliquis Code Status: DNR Family Communication: Pt in room, patient's son at bedside Disposition Plan: Possible return to facility in 24hrs pending repeat COVID test  Consultants:  ID  Procedures:     Antimicrobials: Anti-infectives (From admission, onward)   Start     Dose/Rate Route Frequency Ordered Stop   11/01/19 1800  azithromycin (ZITHROMAX) tablet 500 mg  Status:  Discontinued     500 mg Oral Daily-1800 11/01/19 1040 11/02/19 1436   10/30/19 1100  ampicillin-sulbactam (UNASYN) 1.5 g in sodium  chloride 0.9 % 100 mL IVPB     1.5 g 200 mL/hr over 30 Minutes Intravenous Every 6 hours 10/30/19 1036     10/30/19 1000  remdesivir 100 mg in sodium chloride 0.9 % 100 mL IVPB  Status:  Discontinued     100 mg 200 mL/hr over 30 Minutes Intravenous Daily 10/29/19 1204 10/29/19 1513   10/29/19 1900  azithromycin (ZITHROMAX) 500 mg in sodium chloride 0.9 % 250 mL IVPB  Status:  Discontinued     500 mg 250 mL/hr over 60 Minutes Intravenous Every 24 hours 10/29/19 1845 11/01/19 1040   10/29/19 1600  cefTRIAXone (ROCEPHIN) 1 g in sodium chloride 0.9 % 100 mL IVPB  Status:  Discontinued     1 g 200 mL/hr over 30 Minutes Intravenous Every 24 hours 10/29/19 0944 10/30/19 1036   10/29/19 1400  remdesivir 200 mg in sodium chloride 0.9% 250 mL IVPB  Status:  Discontinued     200 mg 580 mL/hr over 30 Minutes Intravenous Once 10/29/19 1204 10/29/19 1513   10/28/19 2000  metroNIDAZOLE (FLAGYL) IVPB 500 mg  Status:  Discontinued     500 mg 100 mL/hr over 60 Minutes Intravenous Every 8 hours 10/28/19 1714 10/28/19 1907   10/28/19 2000  metroNIDAZOLE (FLAGYL) IVPB 500 mg  Status:  Discontinued     500 mg 100 mL/hr over 60 Minutes Intravenous Every 8 hours 10/28/19 1907 10/30/19 1036   10/28/19 1600  cefTRIAXone (ROCEPHIN) 1 g in sodium chloride 0.9 % 100 mL IVPB     1 g 200 mL/hr over 30 Minutes Intravenous  Once 10/28/19 1553 10/28/19 1728   10/28/19 1600  azithromycin (ZITHROMAX) 500 mg in sodium chloride 0.9 % 250 mL IVPB     500 mg 250 mL/hr over 60 Minutes Intravenous  Once 10/28/19 1553 10/28/19 1728      Subjective: Good spirits, no complaints  Objective: Vitals:   11/01/19 1717 11/01/19 2203 11/02/19 0454 11/02/19 1309  BP: 121/69 122/67 114/61 (!) 93/52  Pulse: 88 65 64 73  Resp: 18 18 20 14   Temp: 98.3 F (36.8 C) 98.7 F (37.1 C) 98 F (36.7 C) 99.1 F (37.3 C)  TempSrc: Oral Oral    SpO2: 98% 98% 100% 100%  Weight:      Height:        Intake/Output Summary (Last 24 hours)  at 11/02/2019 1509 Last data filed at 11/02/2019 1309 Gross per 24 hour  Intake 1821.59 ml  Output 650 ml  Net 1171.59 ml   Filed Weights   10/28/19 1129  Weight: 59 kg    Examination: General exam: Awake, laying in bed, in nad Respiratory system: Normal respiratory effort, no wheezing Cardiovascular system: regular rate, s1, s2 Gastrointestinal system: Soft, nondistended, positive BS Central nervous system: CN2-12 grossly intact, strength intact Extremities: Perfused, no clubbing Skin: Normal skin turgor, no notable skin lesions seen Psychiatry: Mood normal // no visual hallucinations   Data Reviewed: I have personally reviewed following labs and imaging studies  CBC: Recent Labs  Lab 10/28/19 1152 10/29/19 0541 10/30/19 0253 10/31/19 0305 11/01/19 0535  WBC 9.7 11.6* 5.9 5.4  5.7  NEUTROABS 7.8*  --   --   --   --   HGB 11.2* 10.0* 8.6* 8.1* 8.5*  HCT 35.1* 31.6* 29.0* 27.1* 27.4*  MCV 102.6* 101.9* 104.7* 105.4* 103.0*  PLT 203 191 157 177 194   Basic Metabolic Panel: Recent Labs  Lab 10/29/19 0541 10/30/19 0253 10/31/19 0305 11/01/19 0535 11/02/19 0522  NA 144 149* 145 143 141  K 4.3 4.1 3.7 2.5* 4.1  CL 103 111 110 112* 111  CO2 29 30 23 24  20*  GLUCOSE 136* 101* 82 115* 86  BUN 27* 27* 27* 23 21  CREATININE 0.81 0.76 0.68 0.62 0.66  CALCIUM 8.9 8.6* 8.2* 7.8* 8.1*  MG  --   --   --  2.0  --    GFR: Estimated Creatinine Clearance: 58.3 mL/min (by C-G formula based on SCr of 0.66 mg/dL). Liver Function Tests: Recent Labs  Lab 10/28/19 1152 10/29/19 0541 10/30/19 0253 10/31/19 0305 11/01/19 0535  AST 19 25 24 25 25   ALT 7 17 17 19 20   ALKPHOS 123 137* 124 102 98  BILITOT 0.6 0.4 0.3 0.9 0.3  PROT 7.4 7.2 6.6 5.7* 5.8*  ALBUMIN 3.2* 2.7* 2.6* 2.4* 2.3*   No results for input(s): LIPASE, AMYLASE in the last 168 hours. No results for input(s): AMMONIA in the last 168 hours. Coagulation Profile: No results for input(s): INR, PROTIME in the last  168 hours. Cardiac Enzymes: No results for input(s): CKTOTAL, CKMB, CKMBINDEX, TROPONINI in the last 168 hours. BNP (last 3 results) No results for input(s): PROBNP in the last 8760 hours. HbA1C: No results for input(s): HGBA1C in the last 72 hours. CBG: No results for input(s): GLUCAP in the last 168 hours. Lipid Profile: No results for input(s): CHOL, HDL, LDLCALC, TRIG, CHOLHDL, LDLDIRECT in the last 72 hours. Thyroid Function Tests: No results for input(s): TSH, T4TOTAL, FREET4, T3FREE, THYROIDAB in the last 72 hours. Anemia Panel: Recent Labs    10/31/19 0305 11/01/19 0535  FERRITIN 167 154   Sepsis Labs: Recent Labs  Lab 10/28/19 1152 10/28/19 1732 10/29/19 1738 10/30/19 0253 10/31/19 0305  PROCALCITON  --  <0.10 0.23 0.20 0.13  LATICACIDVEN 1.8  --   --   --   --     Recent Results (from the past 240 hour(s))  Blood culture (routine x 2)     Status: None   Collection Time: 10/28/19 11:52 AM   Specimen: BLOOD LEFT HAND  Result Value Ref Range Status   Specimen Description   Final    BLOOD LEFT HAND Performed at [facility], [address] [place], [place]    Special Requests   Final    BOTTLES DRAWN AEROBIC AND ANAEROBIC Blood Culture results may not be optimal due to an inadequate volume of blood received in culture bottles Performed at [facility], [address] [place], [place]    Culture   Final    NO GROWTH 5 DAYS Performed at [facility], [address] [place], [facility] [address]    Report Status 11/02/2019 FINAL  Final  Blood culture (routine x 2)     Status: None   Collection Time: 10/28/19 11:52 AM   Specimen: BLOOD RIGHT FOREARM  Result Value Ref Range Status   Specimen Description   Final    BLOOD RIGHT FOREARM Performed at [facility], [address] [place], [place]    Special  Requests   Final    BOTTLES DRAWN AEROBIC AND ANAEROBIC Blood  Culture adequate volume Performed at [facility], [address], [place], [place] [address]    Culture   Final    NO GROWTH 5 DAYS Performed at [facility], [address] [place] [address], [place], [place] [address]    Report Status 11/02/2019 FINAL  Final  Urine culture     Status: Abnormal   Collection Time: 10/28/19 11:52 AM   Specimen: Urine, Random  Result Value Ref Range Status   Specimen Description   Final    URINE, RANDOM Performed at [facility], [address], [place], [place] [address]    Special Requests   Final    NONE Performed at [facility], [address], [place], [place] [address]    Culture 50,000 COLONIES/mL ENTEROCOCCUS FAECALIS (A)  Final   Report Status 10/30/2019 FINAL  Final   Organism ID, Bacteria ENTEROCOCCUS FAECALIS (A)  Final      Susceptibility   Enterococcus faecalis - MIC*    AMPICILLIN <=2 SENSITIVE Sensitive     NITROFURANTOIN <=16 SENSITIVE Sensitive     VANCOMYCIN >=32 RESISTANT Resistant     LINEZOLID 1 SENSITIVE Sensitive     * 50,000 COLONIES/mL ENTEROCOCCUS FAECALIS  SARS CORONAVIRUS 2 (TAT 6-24 HRS) Nasopharyngeal Nasopharyngeal Swab     Status: None   Collection Time: 10/28/19  1:19 PM   Specimen: Nasopharyngeal Swab  Result Value Ref Range Status   SARS Coronavirus 2 NEGATIVE NEGATIVE Final    Comment: (NOTE) SARS-CoV-2 target nucleic acids are NOT DETECTED. The SARS-CoV-2 RNA is generally detectable in upper and lower respiratory specimens during the acute phase of infection. Negative results do not preclude SARS-CoV-2 infection, do not rule out co-infections with other pathogens, and should not be used as the sole basis for treatment or other patient management decisions. Negative results must be combined with clinical observations, patient history, and epidemiological information. The expected result is Negative. Fact Sheet for  Patients: HairSlick.no Fact Sheet for Healthcare Providers: quierodirigir.com This test is not yet approved or cleared by the [place] FDA and  has been authorized for detection and/or diagnosis of SARS-CoV-2 by FDA under an Emergency Use Authorization (EUA). This EUA will remain  in effect (meaning this test can be used) for the duration of the COVID-19 declaration under Section 56 4(b)(1) of the Act, 21 U.S.C. section 360bbb-3(b)(1), unless the authorization is terminated or revoked sooner. Performed at [facility], [address] [place], [place] [address]   Respiratory Panel by RT PCR (Flu A&B, Covid) - Nasopharyngeal Swab     Status: None   Collection Time: 10/29/19 12:00 PM   Specimen: Nasopharyngeal Swab  Result Value Ref Range Status   SARS Coronavirus 2 by RT PCR NEGATIVE NEGATIVE Final    Comment: (NOTE) SARS-CoV-2 target nucleic acids are NOT DETECTED. The SARS-CoV-2 RNA is generally detectable in upper respiratoy specimens during the acute phase of infection. The lowest concentration of SARS-CoV-2 viral copies this assay can detect is 131 copies/mL. A negative result does not preclude SARS-Cov-2 infection and should not be used as the sole basis for treatment or other patient management decisions. A negative result may occur with  improper specimen collection/handling, submission of specimen other than nasopharyngeal swab, presence of viral mutation(s) within the areas targeted by this assay, and inadequate number of viral copies (<131 copies/mL). A negative result must be combined with clinical  observations, patient history, and epidemiological information. The expected result is Negative. Fact Sheet for Patients:  https://www.moore.com/ Fact Sheet for Healthcare Providers:  https://www.young.biz/ This test is not yet ap proved or cleared by the [place] FDA  and  has been authorized for detection and/or diagnosis of SARS-CoV-2 by FDA under an Emergency Use Authorization (EUA). This EUA will remain  in effect (meaning this test can be used) for the duration of the COVID-19 declaration under Section 564(b)(1) of the Act, 21 U.S.C. section 360bbb-3(b)(1), unless the authorization is terminated or revoked sooner.    Influenza A by PCR NEGATIVE NEGATIVE Final   Influenza B by PCR NEGATIVE NEGATIVE Final    Comment: (NOTE) The Xpert Xpress SARS-CoV-2/FLU/RSV assay is intended as an aid in  the diagnosis of influenza from Nasopharyngeal swab specimens and  should not be used as a sole basis for treatment. Nasal washings and  aspirates are unacceptable for Xpert Xpress SARS-CoV-2/FLU/RSV  testing. Fact Sheet for Patients: https://www.moore.com/ Fact Sheet for Healthcare Providers: https://www.young.biz/ This test is not yet approved or cleared by the [place] FDA and  has been authorized for detection and/or diagnosis of SARS-CoV-2 by  FDA under an Emergency Use Authorization (EUA). This EUA will remain  in effect (meaning this test can be used) for the duration of the  Covid-19 declaration under Section 564(b)(1) of the Act, 21  U.S.C. section 360bbb-3(b)(1), unless the authorization is  terminated or revoked. Performed at [facility], [address] [place], [place] [address]   Respiratory Panel by PCR     Status: None   Collection Time: 10/29/19  6:30 PM   Specimen: Nasopharyngeal Swab; Respiratory  Result Value Ref Range Status   Adenovirus NOT DETECTED NOT DETECTED Final   Coronavirus 229E NOT DETECTED NOT DETECTED Final    Comment: (NOTE) The Coronavirus on the Respiratory Panel, DOES NOT test for the novel  Coronavirus (2019 nCoV)    Coronavirus HKU1 NOT DETECTED NOT DETECTED Final   Coronavirus NL63 NOT DETECTED NOT DETECTED Final   Coronavirus OC43 NOT DETECTED NOT  DETECTED Final   Metapneumovirus NOT DETECTED NOT DETECTED Final   Rhinovirus / Enterovirus NOT DETECTED NOT DETECTED Final   Influenza A NOT DETECTED NOT DETECTED Final   Influenza B NOT DETECTED NOT DETECTED Final   Parainfluenza Virus 1 NOT DETECTED NOT DETECTED Final   Parainfluenza Virus 2 NOT DETECTED NOT DETECTED Final   Parainfluenza Virus 3 NOT DETECTED NOT DETECTED Final   Parainfluenza Virus 4 NOT DETECTED NOT DETECTED Final   Respiratory Syncytial Virus NOT DETECTED NOT DETECTED Final   Bordetella pertussis NOT DETECTED NOT DETECTED Final   Chlamydophila pneumoniae NOT DETECTED NOT DETECTED Final   Mycoplasma pneumoniae NOT DETECTED NOT DETECTED Final    Comment: Performed at [facility], [address] [place], [place] [address]     Radiology Studies: No results found.  Scheduled Meds: . apixaban  2.5 mg Oral BID  . vitamin C  1,000 mg Oral Daily  . carbidopa-levodopa  1 tablet Oral TID WC  . clonazePAM  1 mg Oral TID  . dexamethasone (DECADRON) injection  6 mg Intravenous Q24H  . divalproex  250 mg Oral TID  . DULoxetine  30 mg Oral Daily  . midodrine  2.5 mg Oral TID WC  . pantoprazole  40 mg Oral QHS  . risperiDONE  0.25 mg Oral BID  . zinc sulfate  220 mg Oral Daily   Continuous Infusions: .  sodium chloride 250 mL (11/02/19 0812)  . ampicillin-sulbactam (UNASYN) IV 1.5 g (11/02/19 1114)     LOS: 5 days   Rickey BarbaraStephen Enas Winchel, MD [facility] Pager On Amion  If 7PM-7AM, please contact night-coverage 11/02/2019, 3:09 PM

## 2019-11-03 ENCOUNTER — Inpatient Hospital Stay (HOSPITAL_COMMUNITY): Payer: Medicare Other

## 2019-11-03 MED ORDER — TRAMADOL-ACETAMINOPHEN 37.5-325 MG PO TABS: 1.0000 | ORAL_TABLET | Freq: Three times a day (TID) | ORAL | 0 refills | Status: AC | PRN

## 2019-11-03 MED ORDER — AMOXICILLIN-POT CLAVULANATE 875-125 MG PO TABS: 1.0000 | ORAL_TABLET | Freq: Two times a day (BID) | ORAL | 0 refills | Status: AC

## 2019-11-03 MED ORDER — BENZONATATE 100 MG PO CAPS: 100.0000 mg | ORAL_CAPSULE | Freq: Two times a day (BID) | ORAL | 0 refills | Status: AC | PRN

## 2019-11-03 NOTE — Progress Notes (Signed)
Pt discharged to facility via transport, purewick removed.

## 2019-11-03 NOTE — Progress Notes (Signed)
Modified Barium Swallow Progress Note  Patient Details  Name: Becky Figueroa MRN: [id number] Date of Birth: 28-Jun-1946  Today's Date: 11/03/2019  Modified Barium Swallow completed.  Full report located under Chart Review in the Imaging Section.  Brief recommendations include the following:  Clinical Impression  Patient presents with severe oropharygneal dysphagia with sensorimotor components.  She demonstrates weak swallow with decreased oral propulsion and premature spillage.  Pharyngeal swallow is weak resulting in aspiration *both audible and silent* of liquids both during and after the swallow.  Residuals present which pt did not sense - much worse with increased viscocity. Various postures trialed but pt unable to conduct and she required positioning with pillow to keep head neutral *neck extension upward at baseline.  Skilled intervention included determining dietary and compensation strategies to mitigate aspiration/dysphagia with pt needing total cues to perform.  Pt became tearful during mbs and said she was "trying (her) my best".  Pt also with secretion retention and aspiration during testing.    She is at aspiration risk regardless of consistency provided however to maximize comfort with intake and nutrition would recommend to consider nectar thick liquid diet - allowing thin via tsp.  Also recommend allow thin water between meals after oral care via cup for hydration and comfort.  Will follow up for pt/family education to help mitigate aspiration.  Given pt's level of dysphagia, maintaining nutrition and hydration will likely be challenging.   Swallow Evaluation Recommendations       SLP Diet Recommendations: FULL LIQUIDS Nectar thick liquid via cup/straw ;Thin liquid via tsp   Liquid Administration via: Straw;Spoon(thin via tsp)   Medication Administration: Other (Comment)(crushed with nectar)   Supervision: Full supervision/cueing for compensatory strategies;Full assist  for feeding   Compensations: Minimize environmental distractions;Slow rate;Small sips/bites Cough after few liquid swallows and re-swallow, Intermittent dry swallows   Postural Changes: Remain semi-upright after after feeds/meals (Comment);Seated upright at 90 degrees   Oral Care Recommendations: Oral care QID   Other Recommendations: Order thickener from pharmacy;Have oral suction available   Becky Infante, MS [facility] SLP Acute Rehab Services Office [phone]  Becky Figueroa 11/03/2019,9:58 AM

## 2019-11-03 NOTE — TOC Transition Note (Signed)
Transition of Care [facility]) - CM/SW Discharge Note   Patient Details  Name: MARCEL SORTER MRN: [id number] Date of Birth: [birth year]  [facility] CM/SW Contact:  Lanier Clam, RN Phone Number: 11/03/2019, 2:16 PM   Clinical Narrative:  D/c back to [facility] rm 507P, nurse call report #[phone]. [facility] called. No further CM needs     Final next level of care: Long Term Nursing Home Barriers to Discharge: No Barriers Identified   Patient Goals and CMS Choice Patient states their goals for this hospitalization and ongoing recovery are:: to go back to [facility]      Discharge Placement              Patient chooses bed at: [facility] Patient to be transferred to facility by: PT[facility] Name of family member notified: Ander Purpura son Patient and family notified of of transfer: 11/03/19  Discharge Plan and Services   Discharge Planning Services: CM Consult Post Acute Care Choice: Resumption of Svcs/PTA Provider          DME Arranged: N/A DME Agency: NA       HH Arranged: NA HH Agency: NA        Social Determinants of Health (SDOH) Interventions     Readmission Risk Interventions No flowsheet data found.

## 2019-11-03 NOTE — Progress Notes (Signed)
Called report to [facility] and all questions answered. Son was called and notified as well.

## 2019-11-03 NOTE — Discharge Summary (Signed)
Physician Discharge Summary  Becky Figueroa ZOX:[id number] DOB: [birth year] DOA: 10/28/2019  PCP: Richmond Campbell., PA-C  Admit date: 10/28/2019 Discharge date: 11/03/2019  Admitted From: SNF Disposition:  SNF  Recommendations for Outpatient Follow-up:  1. Follow up with PCP in 1-2 weeks 2. Recommendations per SLP: FULL LIQUIDS Nectar thick liquid via cup/straw ;Thin liquid via tsp Liquid Administration via: Straw;Spoon(thin via tsp) Medication Administration: crushed with nectar Supervision: Full supervision/cueing for compensatory strategies;Full assist for feeding Compensations: Minimize environmental distractions;Slow rate;Small sips/bites Cough after few liquid swallows and re-swallow, Intermittent dry swallows Postural Changes: Remain semi-upright after after feeds/meals (Comment);Seated upright at 90 degrees Oral Care Recommendations: Oral care QID 3.   Per Wound Ostomy Nurse: Dressing procedure/placement/frequency: Continue silicone foam for protection, with current nutritional state doubt healing is our goal. 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 Pt has underlying dementia and cannot provide her own history, only yelling constantly when seen. Family at bedside who states pt recently completed course of abx for PNA and has hx of dysphagia and concerns of aspiration several weeks leading up to ED visit. Pt presents from facility which reportedly has case of COVID outbreak. Per EDP, pt noted to be markedly sob and EMS called. On arrival, pt noted to have O2 sats in the low-80's, improved with 2LNC.  ED Course:In  the ED, had CXR with findings initially suggestive of chronic interstitial changes. Follow up CT chest with findings worrisome for viral PNA. Initial COVID testing was neg. Repeat covid test ordered and is pending. Pt was started on empiric azithro and rocephin. Inflammatory markers were ordered and remain pending. Pt has since been weaned to room air. Hospitalist consulted for consideration for admission.  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 Multifocal PNA with sepsis present on admit 1. Chest CT personally reviewed. Findings worrisome for viral PNA 2. Serial covid testing and repeat testing was neg 3. Pt reportedly with O2 sats in the low 80's in the field, initially improved to mid-90's on room air 4. Family at bedside. Reports recent hx of aspiration over the past several weeks leading up to admission 5. Was continued on empiric azithromycin, rocephin, and flagyl, now on unasyn per below which would cover aspiration PNA. Will transition to augmentin on d/c 6. Appreciate input by ID. Initial concern for possible COVID, however per ID, likely not covid and OK to transfer out of isolation 7. O2 requirements improved to [facility] with 100% O2 sats 8. Re-evaluated to SLP. See above for recs. Very high chance for another aspiration event. Family is aware 2. GERD 1. Continue on PP 3. Dementia with Parkinson's 1. Pt now clear to take PO 2. Have resumed home meds 3. Pt is alert and conversant today. Remains very pleasant 4. Hx DVT on chronic anticoagulation 1. On eliquis prior to admit 2. Given concerns of aspiration, initially held xarelto and continued on therapeutic lovenox 3. Pt now clear for PO, now on home eliquis 5. Anxiety 1. Seems much improved 2. Pleasant this AM 6. Hx aspiration 1. Family had  reported issues with aspiration  over past several weeks prior to admission 2. SLP consulted. See above for recs 7. End of Life 1. Discussed with family at bedside at time of admit 2. Family is well aware of pt's wishes for DNR/DNI 1. Stage 3 sacral decub ulcer, poa 1. Seen by [place], see above for recs  Discharge Instructions   Allergies as of 11/03/2019      Reactions   Sinemet [carbidopa W-levodopa] Nausea And Vomiting   Able to tolerate with Zofran   Sulfa Antibiotics Other (See Comments)   Flushing and hot      Medication List    STOP taking these medications   ALPRAZolam 0.5 MG tablet Commonly known as: XANAX   azithromycin 250 MG tablet Commonly known as: ZITHROMAX   cefTRIAXone 1 g injection Commonly known as: ROCEPHIN   oxyCODONE-acetaminophen 5-325 MG tablet Commonly known as: PERCOCET/ROXICET     TAKE these medications   amoxicillin-clavulanate 875-125 MG tablet Commonly known as: Augmentin Take 1 tablet by mouth every 12 (twelve) hours for 3 days.   benzonatate 100 MG capsule Commonly known as: TESSALON Take 1 capsule (100 mg total) by mouth 2 (two) times daily as needed for cough.   carbidopa-levodopa 25-100 MG tablet Commonly known as: SINEMET IR Take 1 tablet by mouth 3 (three) times daily. We will increase this slowly... For 1 week (take 1 tablet in the morning, 1/2 tablet at lunch, 1/2 tablet at dinner) For 1 week (take 1 tablet in the morning, 1 tablet at lunch, 1/2 tablet at dinner) For 1 week (take 1 tablet in morning, 1 tablet at lunch, 1 tablet at dinner)   clonazePAM 1 MG tablet Commonly known as: KLONOPIN Take 1 mg by mouth 3 (three) times daily.   divalproex 125 MG capsule Commonly known as: DEPAKOTE SPRINKLE Take 250 mg by mouth 3 (three) times daily.   DULoxetine 30 MG capsule Commonly known as: CYMBALTA Take 30 mg by mouth daily.   Eliquis 2.5 MG Tabs tablet Generic drug: apixaban Take 2.5 mg by mouth every 12 (twelve) hours. What changed: Another medication  with the same name was removed. Continue taking this medication, and follow the directions you see here.   feeding supplement (PRO-STAT SUGAR FREE 64) Liqd Take 30 mLs by mouth 2 (two) times daily.   ferrous sulfate 325 (65 FE) MG tablet Take 325 mg by mouth every 12 (twelve) hours.   lansoprazole 30 MG capsule Commonly known as: PREVACID Take 30 mg by mouth every other day.   midodrine 2.5 MG tablet Commonly known as: PROAMATINE Take 1 tablet (2.5 mg total) by mouth 3 (three) times daily with meals.   ondansetron 4 MG tablet Commonly known as: ZOFRAN Take 1 tablet (4 mg total) by mouth every 8 (eight) hours as needed for nausea or vomiting.   risperiDONE 0.5 MG tablet Commonly known as: RISPERDAL Take 0.25 mg by mouth 2 (two) times daily.   silver nitrate applicators 57-84 % applicator Apply 10 Sticks (10 application total) topically once as needed for bleeding (bleeding from sacral wound).   traMADol-acetaminophen 37.5-325 MG tablet Commonly known as: ULTRACET Take 1 tablet by mouth every 8 (eight) hours as needed for moderate pain. Prescription needed for discharge to SNF What changed: additional instructions      Follow-up Information    Aletha Halim., PA-C. Schedule an appointment as soon as possible for a visit in 1 week(s).   Specialty: Family Medicine Contact information: [phone]  [place] [address] [phone]          Allergies  Allergen Reactions  . Sinemet [Carbidopa W-Levodopa] Nausea And Vomiting    Able to tolerate with Zofran  . Sulfa Antibiotics Other (See Comments)    Flushing and hot    Consultations:  ID  Procedures/Studies: CT Angio Chest PE W and/or Wo Contrast  Result Date: 10/28/2019 CLINICAL DATA:  Positive D-dimer. EXAM: CT ANGIOGRAPHY CHEST WITH CONTRAST TECHNIQUE: Multidetector CT imaging of the chest was performed using the standard protocol during bolus administration of intravenous contrast. Multiplanar CT  image reconstructions and MIPs were obtained to evaluate the vascular anatomy. CONTRAST:  OMNIPAQUE IOHEXOL 350 MG/ML SOLN COMPARISON:  None. FINDINGS: Cardiovascular: Evaluation for pulmonary emboli is significantly limited by respiratory motion artifact.Given this limitation, no large centrally located pulmonary embolism was detected. Detection of smaller pulmonary emboli is significantly limited. The heart size is normal. There is no evidence for a thoracic aortic aneurysm or dissection. Minimal atherosclerotic changes are noted of the thoracic aorta. Mediastinum/Nodes: --No mediastinal or hilar lymphadenopathy. --No axillary lymphadenopathy. --No supraclavicular lymphadenopathy. --there is a 1.4 cm left inferior thyroid nodule. No followup recommended (ref: J Am Coll Radiol. 2015 Feb;12(2): 143-50). --The esophagus is unremarkable Lungs/Pleura: Extensive bilateral ground-glass airspace opacities. These may be superimposed on a background of pleuroparenchymal scarring and emphysematous changes. There is no pneumothorax. The trachea is unremarkable. There is no significant pleural effusion. Upper Abdomen: No acute abnormality. Musculoskeletal: No chest wall abnormality. No acute or significant osseous findings. 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 Review of the MIP images confirms the above findings. IMPRESSION: 1. Evaluation for pulmonary emboli is significantly limited by respiratory motion artifact. Given this limitation, no large centrally located pulmonary embolism was detected. 2. There are extensive bilateral pulmonary opacities concerning for an atypical infectious process such as viral pneumonia. Aortic Atherosclerosis (ICD10-I70.0) and Emphysema (ICD10-J43.9). Electronically Signed   By: Katherine Mantle M.D.   On: 10/28/2019 15:16   DG CHEST PORT 1 VIEW  Result Date: 10/29/2019 CLINICAL DATA:  In the ED, had CXR with findings initially suggestive of chronic  interstitial changes. Follow up CT chest with findings worrisome for viral PNA. Initial COVID testing was neg. Repeat covid test ordered and is pending. EXAM: PORTABLE CHEST 1 VIEW COMPARISON:  Chest radiograph 10/28/2019, chest CT 10/28/2019 FINDINGS: Stable cardiomediastinal contours. There are patchy opacities throughout the left mid to lower lung as well as more subtly in the right upper lobe which are suspicious for multifocal infection. No pneumothorax or large pleural effusion. No acute finding in the visualized skeleton. IMPRESSION: Patchy opacities predominantly throughout the left mid to lower lung and to a lesser extent in the right upper lobe suspicious for multifocal infection. Electronically Signed   By: Emmaline Kluver M.D.   On: 10/29/2019 14:01   DG Chest Portable 1 View  Result Date: 10/28/2019 CLINICAL DATA:  Cough EXAM: PORTABLE CHEST 1 VIEW COMPARISON:  12/11/2018 FINDINGS: Asymmetric interstitial prominence within the lungs, left greater than right. Previously seen areas of consolidation in the right lung resolved. Heart is normal size. No effusions. No acute bony abnormality. IMPRESSION: Chronic interstitial prominence, left greater than right. This may reflect chronic interstitial lung disease. Electronically Signed   By: Charlett Nose M.D.   On: 10/28/2019 12:02   DG Swallowing Func-Speech Pathology  Result Date: 11/03/2019 Objective Swallowing Evaluation: Type of Study: MBS-Modified Barium Swallow Study  Patient Details Name: Becky Kleven  Edmonia Figueroa MRN: [id number] Date of Birth: 11/19/1945 Today's Date: 11/03/2019 Time: SLP Start Time (ACUTE ONLY): [id number] -SLP Stop Time (ACUTE ONLY): 0915 SLP Time Calculation (min) (ACUTE ONLY): 24 min Past Medical History: Past Medical History: Diagnosis Date . Anal stenosis  . Anxiety  . Decubitus ulcer  . 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 Depression  . Fatty liver  . GERD (gastroesophageal reflux disease)  . Hiatal hernia  . History of  anal fissures  . History of chronic constipation  . History of kidney stones   1973 . Hyperlipidemia  . Left ureteral calculus  . Parkinson's disease (HCC)  . Wears glasses  Past Surgical History: Past Surgical History: Procedure Laterality Date . ANAL FISSURECTOMY  1994  and Hemorrhoidectomy . BOTOX INJECTION N/A 11/18/2013  Procedure: BOTOX INJECTION;  Surgeon: Hart Carwinora M Brodie, MD;  Location: [facility];  Service: Endoscopy;  Laterality: N/A; . CHOLECYSTECTOMY OPEN  1982 . CYSTOSCOPY WITH RETROGRADE PYELOGRAM, URETEROSCOPY AND STENT PLACEMENT Left 06/06/2016  Procedure: CYSTOSCOPY WITH RETROGRADE PYELOGRAM, URETEROSCOPY AND STENT PLACEMENT;  Surgeon: Barron Alvineavid Grapey, MD;  [facility];  Service: Urology;  Laterality: Left; . FLEXIBLE SIGMOIDOSCOPY N/A 11/18/2013  Procedure: FLEXIBLE SIGMOIDOSCOPY/ with BOTOX;  Surgeon: Hart Carwinora M Brodie, MD;  Location: [facility];  Service: Endoscopy;  Laterality: N/A; . HOLMIUM LASER APPLICATION Left 06/06/2016  Procedure: HOLMIUM LASER APPLICATION;  Surgeon: Barron Alvineavid Grapey, MD;  [facility];  Service: Urology;  Laterality: Left; . IRRIGATION AND DEBRIDEMENT BUTTOCKS N/A 12/02/2018  Procedure: IRRIGATION AND DEBRIDEMENT SACRAL DECUBITUS;  Surgeon: Karie SodaGross, Steven, MD;  Location: [facility];  Service: General;  Laterality: N/A; . TONSILLECTOMY  age 74 . TOTAL ABDOMINAL HYSTERECTOMY W/ BILATERAL SALPINGOOPHORECTOMY  1984  and Appendectomy HPI: 74 y.o. 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  Family states pt recently completed course of abx for PNA and has hx of dysphagia and concerns of aspiration several weeks leading up to ED visit.  Pt seen multiple times by ST in the past year with the most recent recommendations for Dysphagia 3 (soft)  solids and thin liquids.  CXR on 10/29/19 remarkable for "Patchy opacities predominantly throughout the left mid to lower lung and to a lesser extent in the right upper lobe suspicious for multifocal infection."   Subjective: pt awake in flouro chair Assessment / Plan / Recommendation CHL IP CLINICAL IMPRESSIONS 11/03/2019 Clinical Impression Clinical Impression  Patient presents with severe oropharygneal dysphagia with sensorimotor components.  She demonstrates weak swallow with decreased oral propulsion and premature spillage.  Pharyngeal swallow is weak resulting in aspiration *both audible and silent* of liquids both during and after the swallow.  Residuals present which pt did not sense - much worse with increased viscocity. Various postures trialed but pt unable to conduct and she required positioning with pillow to keep head neutral *neck extension upward at baseline.  Skilled intervention included determining dietary and compensation strategies to mitigate aspiration/dysphagia with pt needing total cues to perform.  Pt became tearful during mbs and said she was "trying (her) my best".  Pt also with secretion retention and aspiration during testing.  She is at aspiration risk regardless of consistency provided however to maximize comfort with intake and nutrition would recommend to consider nectar thick liquid diet - allowing thin via tsp.  Also recommend allow thin  water between meals after oral care via cup for hydration and comfort.  Will follow up for pt/family education to help mitigate aspiration.  Given pt's level of dysphagia, maintaining nutrition and hydration will likely be challenging.  Swallow Evaluation Recommendations    SLP Diet Recommendations: FULL LIQUIDS Nectar thick liquid via cup/straw ;Thin liquid via tsp  Liquid Administration via: Straw;Spoon(thin via tsp)  Medication Administration: Other (Comment)(crushed with nectar)  Supervision: Full supervision/cueing for compensatory  strategies;Full assist for feeding  Compensations: Minimize environmental distractions;Slow rate;Small sips/bites Cough after few liquid swallows and re-swallow, Intermittent dry swallows  Postural Changes: Remain semi-upright after after feeds/meals (Comment);Seated upright at 90 degrees  Oral Care Recommendations: Oral care QID  Other Recommendations: Order thickener from pharmacy;Have oral suction available  SLP Visit Diagnosis Dysphagia, oropharyngeal phase (R13.12) Attention and concentration deficit following -- Frontal lobe and executive function deficit following -- Impact on safety and function Risk for inadequate nutrition/hydration;Severe aspiration risk;Moderate aspiration risk   CHL IP TREATMENT RECOMMENDATION 11/03/2019 Treatment Recommendations Therapy as outlined in treatment plan below   Prognosis 11/03/2019 Prognosis for Safe Diet Advancement Fair Barriers to Reach Goals Cognitive deficits Barriers/Prognosis Comment -- CHL IP DIET RECOMMENDATION 11/03/2019 SLP Diet Recommendations Nectar thick liquid;Thin liquid Liquid Administration via [employer] Medication Administration Other (Comment) Compensations Minimize environmental distractions;Slow rate;Small sips/bites Postural Changes Remain semi-upright after after feeds/meals (Comment);Seated upright at 90 degrees   CHL IP OTHER RECOMMENDATIONS 11/03/2019 Recommended Consults -- Oral Care Recommendations Oral care QID Other Recommendations Order thickener from pharmacy;Have oral suction available   CHL IP FOLLOW UP RECOMMENDATIONS 11/03/2019 Follow up Recommendations Skilled Nursing facility;24 hour supervision/assistance   CHL IP FREQUENCY AND DURATION 11/03/2019 Speech Therapy Frequency (ACUTE ONLY) min 2x/week Treatment Duration 2 weeks      CHL IP ORAL PHASE 11/03/2019 Oral Phase Impaired Oral - Pudding Teaspoon -- Oral - Pudding Cup -- Oral - Honey Teaspoon -- Oral - Honey Cup -- Oral - Nectar Teaspoon Delayed oral transit;Premature spillage Oral - Nectar  Cup Delayed oral transit;Weak lingual manipulation;Premature spillage Oral - Nectar Straw Delayed oral transit;Weak lingual manipulation;Premature spillage Oral - Thin Teaspoon Delayed oral transit;Weak lingual manipulation;Premature spillage Oral - Thin Cup NT Oral - Thin Straw Delayed oral transit;Decreased bolus cohesion;Premature spillage;Weak lingual manipulation Oral - Puree Weak lingual manipulation;Reduced posterior propulsion Oral - Mech Soft -- Oral - Regular -- Oral - Multi-Consistency -- Oral - Pill -- Oral Phase - Comment --  CHL IP PHARYNGEAL PHASE 11/03/2019 Pharyngeal Phase Impaired Pharyngeal- Pudding Teaspoon -- Pharyngeal -- Pharyngeal- Pudding Cup -- Pharyngeal -- Pharyngeal- Honey Teaspoon -- Pharyngeal -- Pharyngeal- Honey Cup -- Pharyngeal -- Pharyngeal- Nectar Teaspoon Reduced pharyngeal peristalsis;Reduced epiglottic inversion;Reduced tongue base retraction;Pharyngeal residue - valleculae;Pharyngeal residue - pyriform;Penetration/Aspiration during swallow;Reduced anterior laryngeal mobility;Reduced laryngeal elevation;Reduced airway/laryngeal closure Pharyngeal Material enters airway, passes BELOW cords without attempt by patient to eject out (silent aspiration);Material enters airway, passes BELOW cords and not ejected out despite cough attempt by patient Pharyngeal- Nectar Cup -- Pharyngeal -- Pharyngeal- Nectar Straw Reduced pharyngeal peristalsis;Reduced epiglottic inversion;Reduced tongue base retraction;Moderate aspiration;Pharyngeal residue - valleculae;Penetration/Aspiration during swallow;Reduced anterior laryngeal mobility;Reduced laryngeal elevation;Reduced airway/laryngeal closure Pharyngeal Material enters airway, passes BELOW cords then ejected out Pharyngeal- Thin Teaspoon Trace aspiration;Reduced pharyngeal peristalsis;Reduced epiglottic inversion;Reduced laryngeal elevation;Reduced airway/laryngeal closure;Reduced anterior laryngeal mobility Pharyngeal Material enters airway,  passes BELOW cords then ejected out Pharyngeal- Thin Cup NT Pharyngeal -- Pharyngeal- Thin Straw Reduced epiglottic inversion;Reduced anterior laryngeal mobility;Reduced laryngeal elevation;Reduced pharyngeal peristalsis;Reduced airway/laryngeal closure;Reduced tongue base retraction;Pharyngeal residue - valleculae;Pharyngeal  residue - pyriform;Penetration/Aspiration during swallow;Penetration/Apiration after swallow Pharyngeal Material enters airway, passes BELOW cords without attempt by patient to eject out (silent aspiration) Pharyngeal- Puree Reduced airway/laryngeal closure;Reduced laryngeal elevation;Reduced epiglottic inversion;Reduced pharyngeal peristalsis;Pharyngeal residue - valleculae;Reduced anterior laryngeal mobility Pharyngeal Material does not enter airway Pharyngeal- Mechanical Soft -- Pharyngeal -- Pharyngeal- Regular -- Pharyngeal -- Pharyngeal- Multi-consistency -- Pharyngeal -- Pharyngeal- Pill -- Pharyngeal -- Pharyngeal Comment attempts at chin tuck, head turn left were not effective as pt unable to perform, pt tends to extend head upward and required placement of pillows for optimal positioning for po intake, pt does not sense pharyngeal residuals which were gross with purree consistency, cued dry swallows laborious but effective to decrease but not clear retention, cued "hock" was not effective to decrease residuals, cued cough effective to clear trace upper trachea aspirates that pt did not sense  CHL IP CERVICAL ESOPHAGEAL PHASE 11/03/2019 Cervical Esophageal Phase Impaired Pudding Teaspoon -- Pudding Cup -- Honey Teaspoon -- Honey Cup -- Nectar Teaspoon -- Nectar Cup -- Nectar Straw -- Thin Teaspoon -- Thin Cup -- Thin Straw -- Puree -- Mechanical Soft -- Regular -- Multi-consistency -- Pill -- Cervical Esophageal Comment mild residuals at pyriform sinus that mixed with secretions without pt awareness; esophageal sweep appeared clear but radiologist not present to confirm Chales Abrahams 11/03/2019, 10:00 AM Rolena Infante, MS [facility] SLP Acute Rehab Services Office [phone]                Subjective: Eager to return to SNF  Discharge Exam: Vitals:   11/02/19 2032 11/03/19 0448  BP: 98/63 (!) 101/50  Pulse: 95 64  Resp: 20 18  Temp: 98.2 F (36.8 C) 98.2 F (36.8 C)  SpO2: 97% 100%   Vitals:   11/02/19 0454 11/02/19 1309 11/02/19 2032 11/03/19 0448  BP: 114/61 (!) 93/52 98/63 (!) 101/50  Pulse: 64 73 95 64  Resp: 20 14 20 18   Temp: 98 F (36.7 C) 99.1 F (37.3 C) 98.2 F (36.8 C) 98.2 F (36.8 C)  TempSrc:   Oral Oral  SpO2: 100% 100% 97% 100%  Weight:      Height:        General: Pt is alert, awake, not in acute distress Cardiovascular: RRR, S1/S2 +, no rubs, no gallops Respiratory: CTA bilaterally, no wheezing, no rhonchi Abdominal: Soft, NT, ND, bowel sounds + Extremities: no edema, no cyanosis   The results of significant diagnostics from this hospitalization (including imaging, microbiology, ancillary and laboratory) are listed below for reference.     Microbiology: Recent Results (from the past 240 hour(s))  Blood culture (routine x 2)     Status: None   Collection Time: 10/28/19 11:52 AM   Specimen: BLOOD LEFT HAND  Result Value Ref Range Status   Specimen Description   Final    BLOOD LEFT HAND Performed at [facility], [address], [place], [place] [address]    Special Requests   Final    BOTTLES DRAWN AEROBIC AND ANAEROBIC Blood Culture results may not be optimal due to an inadequate volume of blood received in culture bottles Performed at [facility], [address], [place], [place] [address]    Culture   Final    NO GROWTH 5 DAYS Performed at [facility], [address] [place], [place] [address]    Report Status 11/02/2019 FINAL  Final  Blood culture (routine x 2)     Status: None  Collection Time: 10/28/19 11:52 AM   Specimen: BLOOD RIGHT FOREARM  Result Value Ref Range  Status   Specimen Description   Final    BLOOD RIGHT FOREARM Performed at [facility], [address] [place], [place] [address]    Special Requests   Final    BOTTLES DRAWN AEROBIC AND ANAEROBIC Blood Culture adequate volume Performed at [facility], [address], [place], [place] [address]    Culture   Final    NO GROWTH 5 DAYS Performed at [facility], [address] [place], [place] [address]    Report Status 11/02/2019 FINAL  Final  Urine culture     Status: Abnormal   Collection Time: 10/28/19 11:52 AM   Specimen: Urine, Random  Result Value Ref Range Status   Specimen Description   Final    URINE, RANDOM Performed at [facility], [address] [place], [place] [address]    Special Requests   Final    NONE Performed at [facility], [address], [place], [place] [address]    Culture 50,000 COLONIES/mL ENTEROCOCCUS FAECALIS (A)  Final   Report Status 10/30/2019 FINAL  Final   Organism ID, Bacteria ENTEROCOCCUS FAECALIS (A)  Final      Susceptibility   Enterococcus faecalis - MIC*    AMPICILLIN <=2 SENSITIVE Sensitive     NITROFURANTOIN <=16 SENSITIVE Sensitive     VANCOMYCIN >=32 RESISTANT Resistant     LINEZOLID 1 SENSITIVE Sensitive     * 50,000 COLONIES/mL ENTEROCOCCUS FAECALIS  SARS CORONAVIRUS 2 (TAT 6-24 HRS) Nasopharyngeal Nasopharyngeal Swab     Status: None   Collection Time: 10/28/19  1:19 PM   Specimen: Nasopharyngeal Swab  Result Value Ref Range Status   SARS Coronavirus 2 NEGATIVE NEGATIVE Final    Comment: (NOTE) SARS-CoV-2 target nucleic acids are NOT DETECTED. The SARS-CoV-2 RNA is generally detectable in upper and lower respiratory specimens during the acute phase of infection. Negative results do not preclude SARS-CoV-2 infection, do not rule out co-infections with other pathogens, and should not be used as the sole basis for treatment or other patient  management decisions. Negative results must be combined with clinical observations, patient history, and epidemiological information. The expected result is Negative. Fact Sheet for Patients: HairSlick.no Fact Sheet for Healthcare Providers: quierodirigir.com This test is not yet approved or cleared by the [place] FDA and  has been authorized for detection and/or diagnosis of SARS-CoV-2 by FDA under an Emergency Use Authorization (EUA). This EUA will remain  in effect (meaning this test can be used) for the duration of the COVID-19 declaration under Section 56 4(b)(1) of the Act, 21 U.S.C. section 360bbb-3(b)(1), unless the authorization is terminated or revoked sooner. Performed at [facility], [address] [place], [place] [address]   Respiratory Panel by RT PCR (Flu A&B, Covid) - Nasopharyngeal Swab     Status: None   Collection Time: 10/29/19 12:00 PM   Specimen: Nasopharyngeal Swab  Result Value Ref Range Status   SARS Coronavirus 2 by RT PCR NEGATIVE NEGATIVE Final    Comment: (NOTE) SARS-CoV-2 target nucleic acids are NOT DETECTED. The SARS-CoV-2 RNA is generally detectable in upper respiratoy specimens during the acute phase of infection. The lowest concentration of SARS-CoV-2 viral copies this assay can detect is 131 copies/mL. A negative result does not preclude SARS-Cov-2 infection and should not be used as the sole basis for treatment or other  patient management decisions. A negative result may occur with  improper specimen collection/handling, submission of specimen other than nasopharyngeal swab, presence of viral mutation(s) within the areas targeted by this assay, and inadequate number of viral copies (<131 copies/mL). A negative result must be combined with clinical observations, patient history, and epidemiological information. The expected result is Negative. Fact Sheet for Patients:   https://www.moore.com/ Fact Sheet for Healthcare Providers:  https://www.young.biz/ This test is not yet ap proved or cleared by the [place] FDA and  has been authorized for detection and/or diagnosis of SARS-CoV-2 by FDA under an Emergency Use Authorization (EUA). This EUA will remain  in effect (meaning this test can be used) for the duration of the COVID-19 declaration under Section 564(b)(1) of the Act, 21 U.S.C. section 360bbb-3(b)(1), unless the authorization is terminated or revoked sooner.    Influenza A by PCR NEGATIVE NEGATIVE Final   Influenza B by PCR NEGATIVE NEGATIVE Final    Comment: (NOTE) The Xpert Xpress SARS-CoV-2/FLU/RSV assay is intended as an aid in  the diagnosis of influenza from Nasopharyngeal swab specimens and  should not be used as a sole basis for treatment. Nasal washings and  aspirates are unacceptable for Xpert Xpress SARS-CoV-2/FLU/RSV  testing. Fact Sheet for Patients: https://www.moore.com/ Fact Sheet for Healthcare Providers: https://www.young.biz/ This test is not yet approved or cleared by the [place] FDA and  has been authorized for detection and/or diagnosis of SARS-CoV-2 by  FDA under an Emergency Use Authorization (EUA). This EUA will remain  in effect (meaning this test can be used) for the duration of the  Covid-19 declaration under Section 564(b)(1) of the Act, 21  U.S.C. section 360bbb-3(b)(1), unless the authorization is  terminated or revoked. Performed at [facility], [address], [place], [place] [address]   Respiratory Panel by PCR     Status: None   Collection Time: 10/29/19  6:30 PM   Specimen: Nasopharyngeal Swab; Respiratory  Result Value Ref Range Status   Adenovirus NOT DETECTED NOT DETECTED Final   Coronavirus 229E NOT DETECTED NOT DETECTED Final    Comment: (NOTE) The Coronavirus on the Respiratory Panel,  DOES NOT test for the novel  Coronavirus (2019 nCoV)    Coronavirus HKU1 NOT DETECTED NOT DETECTED Final   Coronavirus NL63 NOT DETECTED NOT DETECTED Final   Coronavirus OC43 NOT DETECTED NOT DETECTED Final   Metapneumovirus NOT DETECTED NOT DETECTED Final   Rhinovirus / Enterovirus NOT DETECTED NOT DETECTED Final   Influenza A NOT DETECTED NOT DETECTED Final   Influenza B NOT DETECTED NOT DETECTED Final   Parainfluenza Virus 1 NOT DETECTED NOT DETECTED Final   Parainfluenza Virus 2 NOT DETECTED NOT DETECTED Final   Parainfluenza Virus 3 NOT DETECTED NOT DETECTED Final   Parainfluenza Virus 4 NOT DETECTED NOT DETECTED Final   Respiratory Syncytial Virus NOT DETECTED NOT DETECTED Final   Bordetella pertussis NOT DETECTED NOT DETECTED Final   Chlamydophila pneumoniae NOT DETECTED NOT DETECTED Final   Mycoplasma pneumoniae NOT DETECTED NOT DETECTED Final    Comment: Performed at [facility], [address] [place], [place] [address]  SARS CORONAVIRUS 2 (TAT 6-24 HRS) Nasopharyngeal Nasopharyngeal Swab     Status: None   Collection Time: 11/02/19 12:44 PM   Specimen: Nasopharyngeal Swab  Result Value Ref Range Status   SARS Coronavirus 2 NEGATIVE NEGATIVE Final    Comment: (NOTE) SARS-CoV-2 target nucleic acids are NOT DETECTED. The SARS-CoV-2 RNA is generally detectable in upper  and lower respiratory specimens during the acute phase of infection. Negative results do not preclude SARS-CoV-2 infection, do not rule out co-infections with other pathogens, and should not be used as the sole basis for treatment or other patient management decisions. Negative results must be combined with clinical observations, patient history, and epidemiological information. The expected result is Negative. Fact Sheet for Patients: HairSlick.nohttps://www.fda.gov/media/138098/download Fact Sheet for Healthcare Providers: quierodirigir.comhttps://www.fda.gov/media/138095/download This test is not yet approved or cleared by  the [place] FDA and  has been authorized for detection and/or diagnosis of SARS-CoV-2 by FDA under an Emergency Use Authorization (EUA). This EUA will remain  in effect (meaning this test can be used) for the duration of the COVID-19 declaration under Section 56 4(b)(1) of the Act, 21 U.S.C. section 360bbb-3(b)(1), unless the authorization is terminated or revoked sooner. Performed at [facility], [address] [place], [place] [address]      Labs: BNP (last 3 results) No results for input(s): BNP in the last 8760 hours. Basic Metabolic Panel: Recent Labs  Lab 10/29/19 0541 10/30/19 0253 10/31/19 0305 11/01/19 0535 11/02/19 0522  NA 144 149* 145 143 141  K 4.3 4.1 3.7 2.5* 4.1  CL 103 111 110 112* 111  CO2 29 30 23 24  20*  GLUCOSE 136* 101* 82 115* 86  BUN 27* 27* 27* 23 21  CREATININE 0.81 0.76 0.68 0.62 0.66  CALCIUM 8.9 8.6* 8.2* 7.8* 8.1*  MG  --   --   --  2.0  --    Liver Function Tests: Recent Labs  Lab 10/28/19 1152 10/29/19 0541 10/30/19 0253 10/31/19 0305 11/01/19 0535  AST 19 25 24 25 25   ALT 7 17 17 19 20   ALKPHOS 123 137* 124 102 98  BILITOT 0.6 0.4 0.3 0.9 0.3  PROT 7.4 7.2 6.6 5.7* 5.8*  ALBUMIN 3.2* 2.7* 2.6* 2.4* 2.3*   No results for input(s): LIPASE, AMYLASE in the last 168 hours. No results for input(s): AMMONIA in the last 168 hours. CBC: Recent Labs  Lab 10/28/19 1152 10/29/19 0541 10/30/19 0253 10/31/19 0305 11/01/19 0535  WBC 9.7 11.6* 5.9 5.4 5.7  NEUTROABS 7.8*  --   --   --   --   HGB 11.2* 10.0* 8.6* 8.1* 8.5*  HCT 35.1* 31.6* 29.0* 27.1* 27.4*  MCV 102.6* 101.9* 104.7* 105.4* 103.0*  PLT 203 191 157 177 194   Cardiac Enzymes: No results for input(s): CKTOTAL, CKMB, CKMBINDEX, TROPONINI in the last 168 hours. BNP: Invalid input(s): POCBNP CBG: No results for input(s): GLUCAP in the last 168 hours. D-Dimer No results for input(s): DDIMER in the last 72 hours. Hgb A1c No results for input(s): HGBA1C in  the last 72 hours. Lipid Profile No results for input(s): CHOL, HDL, LDLCALC, TRIG, CHOLHDL, LDLDIRECT in the last 72 hours. Thyroid function studies No results for input(s): TSH, T4TOTAL, T3FREE, THYROIDAB in the last 72 hours.  Invalid input(s): FREET3 Anemia work up Recent Labs    11/01/19 0535  FERRITIN 154   Urinalysis    Component Value Date/Time   COLORURINE YELLOW 10/28/2019 1152   APPEARANCEUR CLEAR 10/28/2019 1152   LABSPEC 1.018 10/28/2019 1152   PHURINE 5.0 10/28/2019 1152   GLUCOSEU NEGATIVE 10/28/2019 1152   HGBUR MODERATE (A) 10/28/2019 1152   BILIRUBINUR NEGATIVE 10/28/2019 1152   KETONESUR NEGATIVE 10/28/2019 1152   PROTEINUR 30 (A) 10/28/2019 1152   UROBILINOGEN 1.0 06/07/2014 2200   NITRITE NEGATIVE 10/28/2019 1152   LEUKOCYTESUR NEGATIVE 10/28/2019  1152   Sepsis Labs Invalid input(s): PROCALCITONIN,  WBC,  LACTICIDVEN Microbiology Recent Results (from the past 240 hour(s))  Blood culture (routine x 2)     Status: None   Collection Time: 10/28/19 11:52 AM   Specimen: BLOOD LEFT HAND  Result Value Ref Range Status   Specimen Description   Final    BLOOD LEFT HAND Performed at [facility], [address] [place], [place] [address]    Special Requests   Final    BOTTLES DRAWN AEROBIC AND ANAEROBIC Blood Culture results may not be optimal due to an inadequate volume of blood received in culture bottles Performed at [facility], [address] [place], [place] [address]    Culture   Final    NO GROWTH 5 DAYS Performed at [facility], [address] [place], [place] [address]    Report Status 11/02/2019 FINAL  Final  Blood culture (routine x 2)     Status: None   Collection Time: 10/28/19 11:52 AM   Specimen: BLOOD RIGHT FOREARM  Result Value Ref Range Status   Specimen Description   Final    BLOOD RIGHT FOREARM Performed at [facility], [address], [place], [place]  [address]    Special Requests   Final    BOTTLES DRAWN AEROBIC AND ANAEROBIC Blood Culture adequate volume Performed at [facility], [address] [place], [place] [address]    Culture   Final    NO GROWTH 5 DAYS Performed at [facility], [address], [place], [place] [address]    Report Status 11/02/2019 FINAL  Final  Urine culture     Status: Abnormal   Collection Time: 10/28/19 11:52 AM   Specimen: Urine, Random  Result Value Ref Range Status   Specimen Description   Final    URINE, RANDOM Performed at [facility], [address], [place], [place] [address]    Special Requests   Final    NONE Performed at [facility], [address], [place], [place] [address]    Culture 50,000 COLONIES/mL ENTEROCOCCUS FAECALIS (A)  Final   Report Status 10/30/2019 FINAL  Final   Organism ID, Bacteria ENTEROCOCCUS FAECALIS (A)  Final      Susceptibility   Enterococcus faecalis - MIC*    AMPICILLIN <=2 SENSITIVE Sensitive     NITROFURANTOIN <=16 SENSITIVE Sensitive     VANCOMYCIN >=32 RESISTANT Resistant     LINEZOLID 1 SENSITIVE Sensitive     * 50,000 COLONIES/mL ENTEROCOCCUS FAECALIS  SARS CORONAVIRUS 2 (TAT 6-24 HRS) Nasopharyngeal Nasopharyngeal Swab     Status: None   Collection Time: 10/28/19  1:19 PM   Specimen: Nasopharyngeal Swab  Result Value Ref Range Status   SARS Coronavirus 2 NEGATIVE NEGATIVE Final    Comment: (NOTE) SARS-CoV-2 target nucleic acids are NOT DETECTED. The SARS-CoV-2 RNA is generally detectable in upper and lower respiratory specimens during the acute phase of infection. Negative results do not preclude SARS-CoV-2 infection, do not rule out co-infections with other pathogens, and should not be used as the sole basis for treatment or other patient management decisions. Negative results must be combined with clinical observations, patient history, and epidemiological information. The  expected result is Negative. Fact Sheet for Patients: HairSlick.no Fact Sheet for Healthcare Providers: quierodirigir.com This test is not yet approved or cleared by the [place] FDA and  has been authorized for detection and/or  diagnosis of SARS-CoV-2 by FDA under an Emergency Use Authorization (EUA). This EUA will remain  in effect (meaning this test can be used) for the duration of the COVID-19 declaration under Section 56 4(b)(1) of the Act, 21 U.S.C. section 360bbb-3(b)(1), unless the authorization is terminated or revoked sooner. Performed at [facility], [address] [place], [place] [address]   Respiratory Panel by RT PCR (Flu A&B, Covid) - Nasopharyngeal Swab     Status: None   Collection Time: 10/29/19 12:00 PM   Specimen: Nasopharyngeal Swab  Result Value Ref Range Status   SARS Coronavirus 2 by RT PCR NEGATIVE NEGATIVE Final    Comment: (NOTE) SARS-CoV-2 target nucleic acids are NOT DETECTED. The SARS-CoV-2 RNA is generally detectable in upper respiratoy specimens during the acute phase of infection. The lowest concentration of SARS-CoV-2 viral copies this assay can detect is 131 copies/mL. A negative result does not preclude SARS-Cov-2 infection and should not be used as the sole basis for treatment or other patient management decisions. A negative result may occur with  improper specimen collection/handling, submission of specimen other than nasopharyngeal swab, presence of viral mutation(s) within the areas targeted by this assay, and inadequate number of viral copies (<131 copies/mL). A negative result must be combined with clinical observations, patient history, and epidemiological information. The expected result is Negative. Fact Sheet for Patients:  https://www.moore.com/ Fact Sheet for Healthcare Providers:  https://www.young.biz/ This test is not yet  ap proved or cleared by the [place] FDA and  has been authorized for detection and/or diagnosis of SARS-CoV-2 by FDA under an Emergency Use Authorization (EUA). This EUA will remain  in effect (meaning this test can be used) for the duration of the COVID-19 declaration under Section 564(b)(1) of the Act, 21 U.S.C. section 360bbb-3(b)(1), unless the authorization is terminated or revoked sooner.    Influenza A by PCR NEGATIVE NEGATIVE Final   Influenza B by PCR NEGATIVE NEGATIVE Final    Comment: (NOTE) The Xpert Xpress SARS-CoV-2/FLU/RSV assay is intended as an aid in  the diagnosis of influenza from Nasopharyngeal swab specimens and  should not be used as a sole basis for treatment. Nasal washings and  aspirates are unacceptable for Xpert Xpress SARS-CoV-2/FLU/RSV  testing. Fact Sheet for Patients: https://www.moore.com/ Fact Sheet for Healthcare Providers: https://www.young.biz/ This test is not yet approved or cleared by the [place] FDA and  has been authorized for detection and/or diagnosis of SARS-CoV-2 by  FDA under an Emergency Use Authorization (EUA). This EUA will remain  in effect (meaning this test can be used) for the duration of the  Covid-19 declaration under Section 564(b)(1) of the Act, 21  U.S.C. section 360bbb-3(b)(1), unless the authorization is  terminated or revoked. Performed at [facility], [address] [place], [place] [address]   Respiratory Panel by PCR     Status: None   Collection Time: 10/29/19  6:30 PM   Specimen: Nasopharyngeal Swab; Respiratory  Result Value Ref Range Status   Adenovirus NOT DETECTED NOT DETECTED Final   Coronavirus 229E NOT DETECTED NOT DETECTED Final    Comment: (NOTE) The Coronavirus on the Respiratory Panel, DOES NOT test for the novel  Coronavirus (2019 nCoV)    Coronavirus HKU1 NOT DETECTED NOT DETECTED Final   Coronavirus NL63 NOT DETECTED NOT  DETECTED Final   Coronavirus OC43 NOT DETECTED NOT DETECTED Final   Metapneumovirus NOT DETECTED NOT DETECTED Final   Rhinovirus / Enterovirus NOT DETECTED NOT DETECTED Final  Influenza A NOT DETECTED NOT DETECTED Final   Influenza B NOT DETECTED NOT DETECTED Final   Parainfluenza Virus 1 NOT DETECTED NOT DETECTED Final   Parainfluenza Virus 2 NOT DETECTED NOT DETECTED Final   Parainfluenza Virus 3 NOT DETECTED NOT DETECTED Final   Parainfluenza Virus 4 NOT DETECTED NOT DETECTED Final   Respiratory Syncytial Virus NOT DETECTED NOT DETECTED Final   Bordetella pertussis NOT DETECTED NOT DETECTED Final   Chlamydophila pneumoniae NOT DETECTED NOT DETECTED Final   Mycoplasma pneumoniae NOT DETECTED NOT DETECTED Final    Comment: Performed at [facility], [address], [place], [place] [address]  SARS CORONAVIRUS 2 (TAT 6-24 HRS) Nasopharyngeal Nasopharyngeal Swab     Status: None   Collection Time: 11/02/19 12:44 PM   Specimen: Nasopharyngeal Swab  Result Value Ref Range Status   SARS Coronavirus 2 NEGATIVE NEGATIVE Final    Comment: (NOTE) SARS-CoV-2 target nucleic acids are NOT DETECTED. The SARS-CoV-2 RNA is generally detectable in upper and lower respiratory specimens during the acute phase of infection. Negative results do not preclude SARS-CoV-2 infection, do not rule out co-infections with other pathogens, and should not be used as the sole basis for treatment or other patient management decisions. Negative results must be combined with clinical observations, patient history, and epidemiological information. The expected result is Negative. Fact Sheet for Patients: HairSlick.no Fact Sheet for Healthcare Providers: quierodirigir.com This test is not yet approved or cleared by the [place] FDA and  has been authorized for detection and/or diagnosis of SARS-CoV-2 by FDA under an Emergency Use Authorization (EUA).  This EUA will remain  in effect (meaning this test can be used) for the duration of the COVID-19 declaration under Section 56 4(b)(1) of the Act, 21 U.S.C. section 360bbb-3(b)(1), unless the authorization is terminated or revoked sooner. Performed at [facility], [address], [place], [place] [address]    Time spent: 30 min  SIGNED:   Rickey Barbara, MD  [facility] 11/03/2019, 1:00 PM  If 7PM-7AM, please contact night-coverage

## 2019-11-30 DEATH — deceased

## 2020-01-15 IMAGING — CT CT ANGIO CHEST
2 of 6 series · 18 of 36 positions shown · IV contrast (omnipaque)
Comparison: None.

CLINICAL DATA: Positive D-dimer.

EXAM:
CT ANGIOGRAPHY CHEST WITH CONTRAST
TECHNIQUE: Multidetector CT imaging of the chest was performed using the
standard protocol during bolus administration of intravenous
contrast. Multiplanar CT image reconstructions and MIPs were
obtained to evaluate the vascular anatomy.
CONTRAST:  100mL OMNIPAQUE IOHEXOL 350 MG/ML SOLN

[Series 6: thins · axial · 0.66mm/px · z∈[+1563,+1765]mm · 17 of 228 slices shown]
[im 13/228  lung]
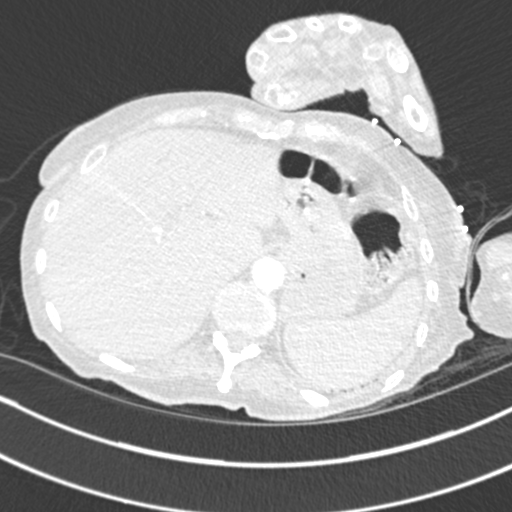
[im 26/228  mediastinal]
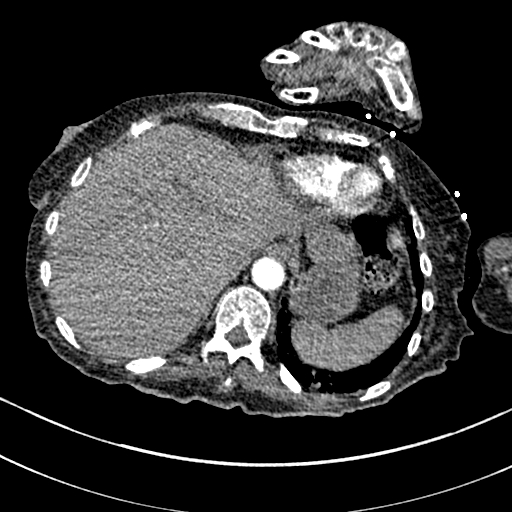
[im 38/228  lung]
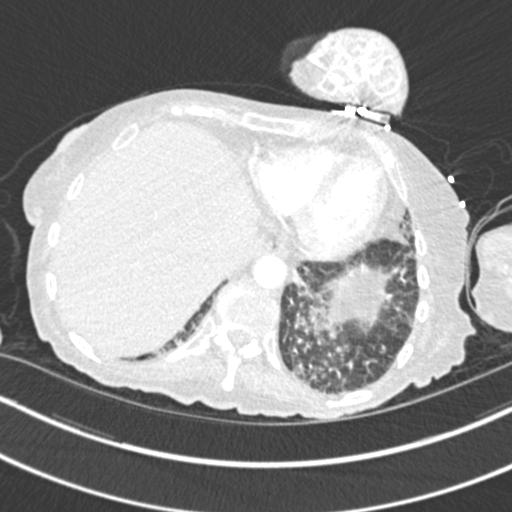
[im 51/228  mediastinal]
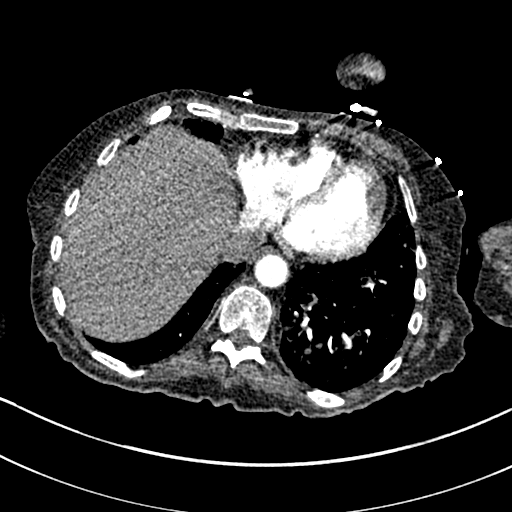
[im 64/228  lung]
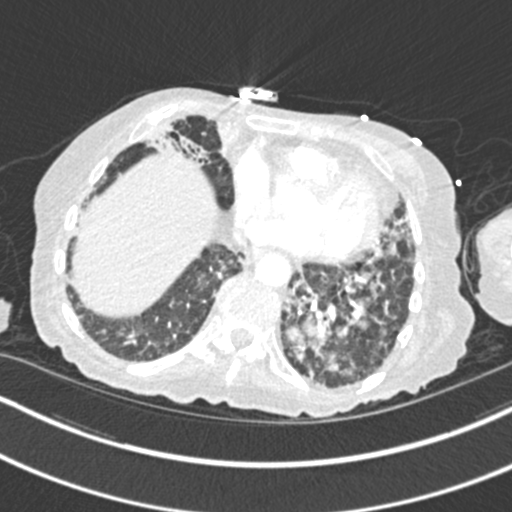
[im 76/228  mediastinal]
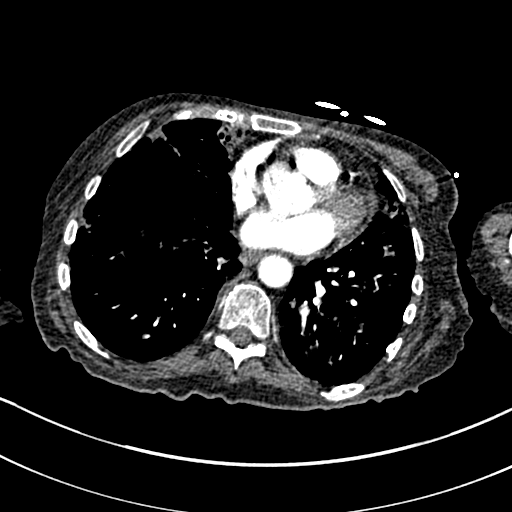
[im 89/228  lung]
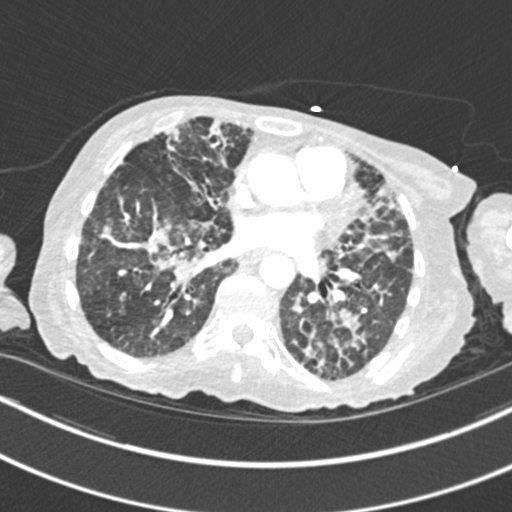
[im 101/228  mediastinal]
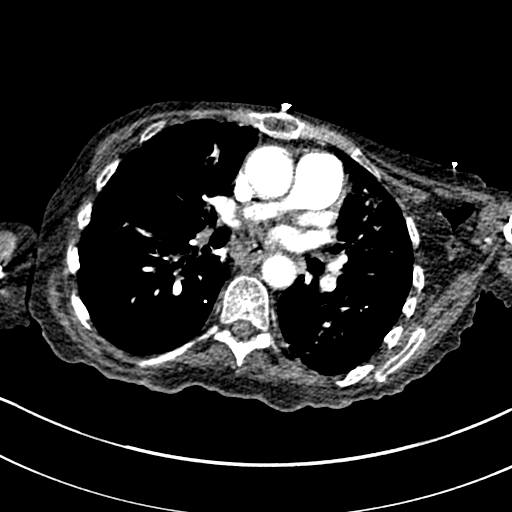
[im 114/228  lung]
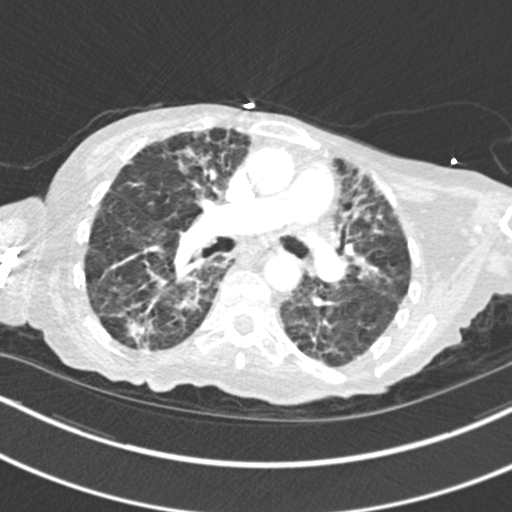
[im 127/228  mediastinal]
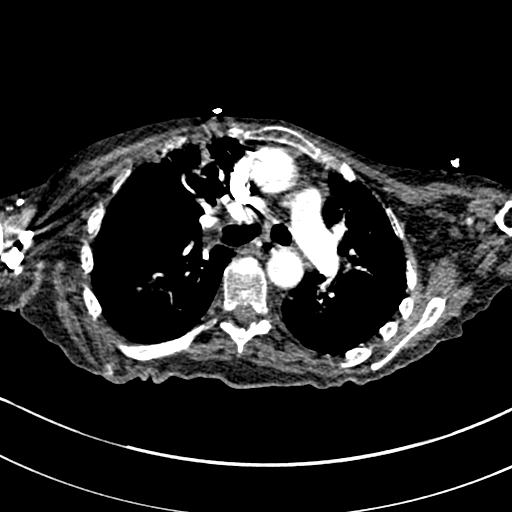
[im 139/228  lung]
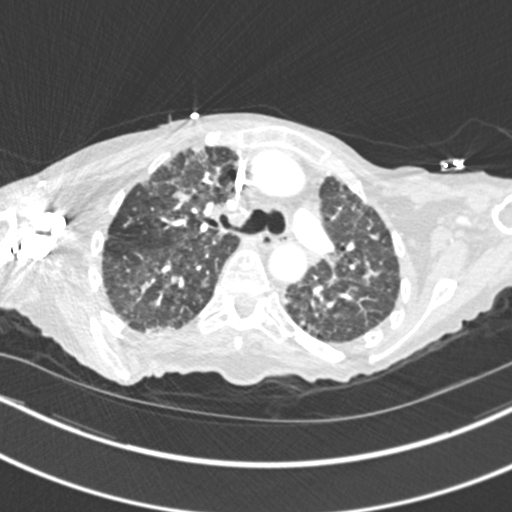
[im 152/228  mediastinal]
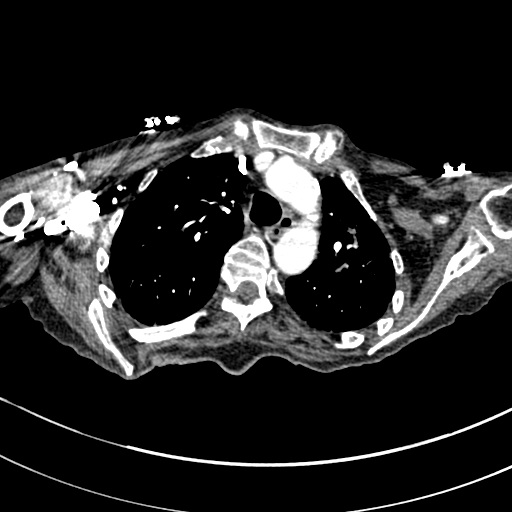
[im 164/228  lung]
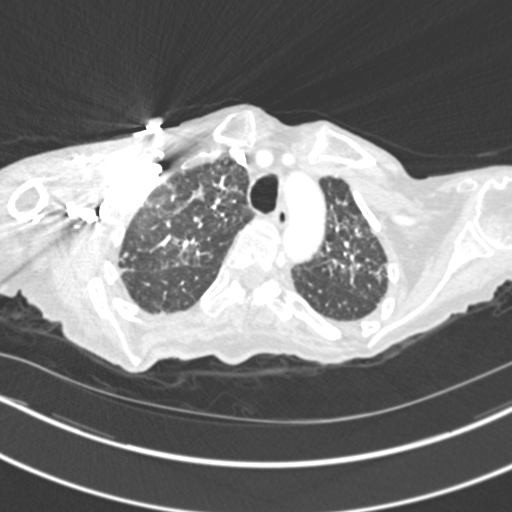
[im 177/228  mediastinal]
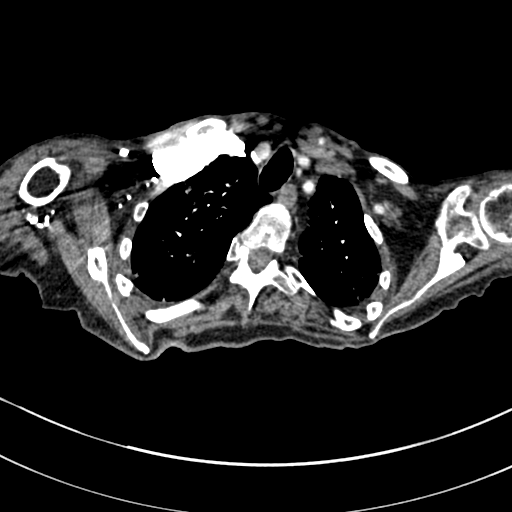
[im 190/228  lung]
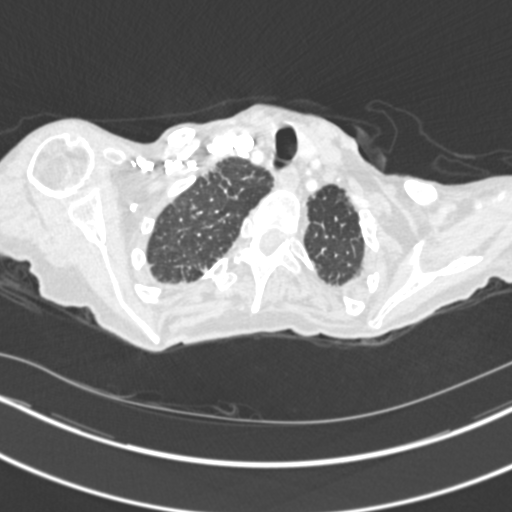
[im 202/228  mediastinal]
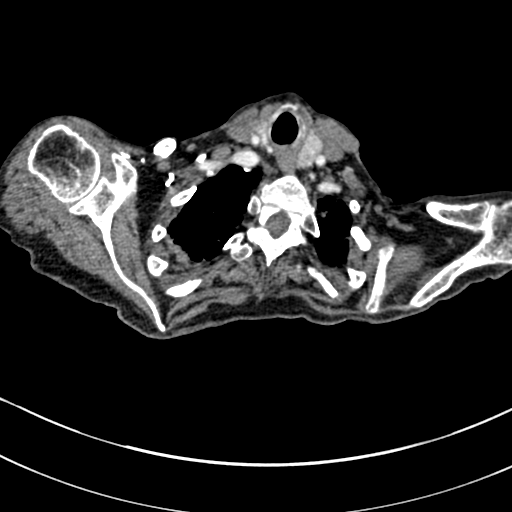
[im 215/228  lung]
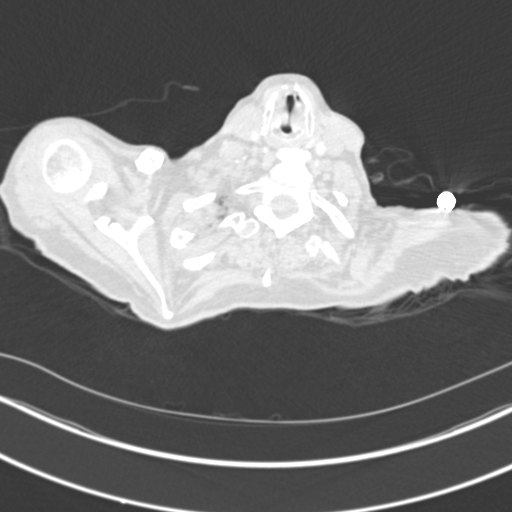

[Series 8: coronal mpr · coronal · 0.49mm/px · 1 of 104 slices shown]
[im 52/104  mediastinal]
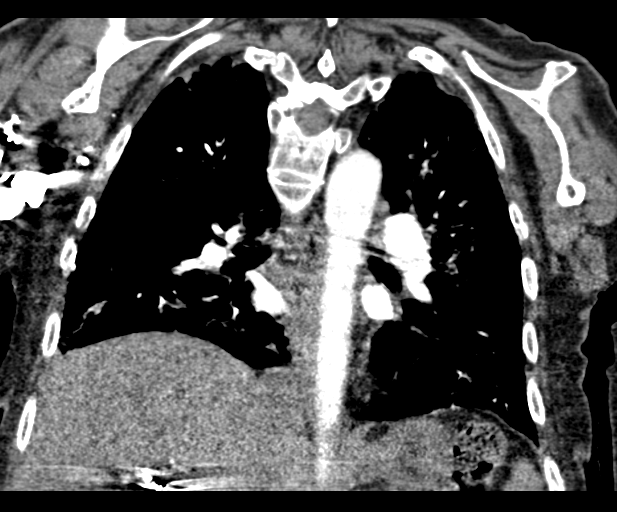

[18 of 36 positions shown; findings below may reference images not displayed]

FINDINGS: Cardiovascular: Evaluation for pulmonary emboli is significantly
limited by respiratory motion artifact.Given this limitation, no
large centrally located pulmonary embolism was detected. Detection
of smaller pulmonary emboli is significantly limited. The heart size
is normal. There is no evidence for a thoracic aortic aneurysm or
dissection. Minimal atherosclerotic changes are noted of the
thoracic aorta.

Mediastinum/Nodes:

--No mediastinal or hilar lymphadenopathy.

--No axillary lymphadenopathy.

--No supraclavicular lymphadenopathy.

--there is a 1.4 cm left inferior thyroid nodule. No followup
recommended (ref: [HOSPITAL]. [DATE]): 143-50).

--The esophagus is unremarkable

Lungs/Pleura: Extensive bilateral ground-glass airspace opacities.
These may be superimposed on a background of pleuroparenchymal
scarring and emphysematous changes. There is no pneumothorax. The
trachea is unremarkable. There is no significant pleural effusion.

Upper Abdomen: No acute abnormality.

Musculoskeletal: No chest wall abnormality. No acute or significant
osseous findings. There is a left-sided cervical rib arising from
the C7 vertebral body.

Review of the MIP images confirms the above findings.
IMPRESSION: 1. Evaluation for pulmonary emboli is significantly limited by
respiratory motion artifact. Given this limitation, no large
centrally located pulmonary embolism was detected.
2. There are extensive bilateral pulmonary opacities concerning for
an atypical infectious process such as viral pneumonia.

Aortic Atherosclerosis ([id number]) and Emphysema ([id number].E).

## 2020-02-10 ENCOUNTER — Ambulatory Visit: Payer: Medicare Other | Admitting: Neurology
